# Patient Record
Sex: Female | Born: 1935 | Race: White | Hispanic: No | Smoking: Former smoker
Health system: Southern US, Community
[De-identification: ages and names within clinical notes are randomized; demographics above are authoritative.]

## PROBLEM LIST (undated history)

## (undated) DIAGNOSIS — Z8489 Family history of other specified conditions: Secondary | ICD-10-CM

## (undated) DIAGNOSIS — M5430 Sciatica, unspecified side: Secondary | ICD-10-CM

## (undated) DIAGNOSIS — I10 Essential (primary) hypertension: Secondary | ICD-10-CM

## (undated) DIAGNOSIS — C801 Malignant (primary) neoplasm, unspecified: Secondary | ICD-10-CM

## (undated) HISTORY — PX: ABDOMINAL HYSTERECTOMY: SHX81

## (undated) HISTORY — PX: TOTAL HIP ARTHROPLASTY: SHX124

## (undated) HISTORY — PX: CHOLECYSTECTOMY: SHX55

## (undated) HISTORY — PX: CATARACT EXTRACTION, BILATERAL: SHX1313

## (undated) HISTORY — PX: HIP SURGERY: SHX245

## (undated) HISTORY — PX: APPENDECTOMY: SHX54

## (undated) HISTORY — PX: SHOULDER SURGERY: SHX246

---

## 2004-12-08 ENCOUNTER — Ambulatory Visit: Payer: Self-pay | Admitting: Family Medicine

## 2006-04-19 ENCOUNTER — Ambulatory Visit: Payer: Self-pay | Admitting: Family Medicine

## 2006-07-27 ENCOUNTER — Ambulatory Visit: Payer: Self-pay | Admitting: Family Medicine

## 2007-03-05 ENCOUNTER — Encounter: Payer: Self-pay | Admitting: Otolaryngology

## 2007-03-28 ENCOUNTER — Encounter: Payer: Self-pay | Admitting: Otolaryngology

## 2007-05-22 ENCOUNTER — Ambulatory Visit: Payer: Self-pay | Admitting: Family Medicine

## 2007-10-16 ENCOUNTER — Ambulatory Visit: Payer: Self-pay | Admitting: Unknown Physician Specialty

## 2008-04-27 ENCOUNTER — Ambulatory Visit: Payer: Self-pay | Admitting: Unknown Physician Specialty

## 2008-06-04 ENCOUNTER — Ambulatory Visit: Payer: Self-pay | Admitting: Family Medicine

## 2009-10-18 ENCOUNTER — Ambulatory Visit: Payer: Self-pay | Admitting: Family Medicine

## 2010-09-29 ENCOUNTER — Ambulatory Visit: Payer: Self-pay | Admitting: Orthopedic Surgery

## 2010-10-24 ENCOUNTER — Ambulatory Visit: Payer: Self-pay | Admitting: Family Medicine

## 2010-11-18 ENCOUNTER — Ambulatory Visit: Payer: Self-pay | Admitting: Orthopedic Surgery

## 2011-02-10 ENCOUNTER — Encounter: Payer: Self-pay | Admitting: Internal Medicine

## 2011-02-26 ENCOUNTER — Encounter: Payer: Self-pay | Admitting: Internal Medicine

## 2011-03-28 ENCOUNTER — Encounter: Payer: Self-pay | Admitting: Internal Medicine

## 2011-10-26 ENCOUNTER — Ambulatory Visit: Payer: Self-pay | Admitting: Internal Medicine

## 2012-03-13 ENCOUNTER — Ambulatory Visit: Payer: Self-pay | Admitting: Internal Medicine

## 2012-03-13 LAB — CREATININE, SERUM
Creatinine: 0.92 mg/dL (ref 0.60–1.30)
EGFR (African American): >60
EGFR (Non-African Amer.): 58 — ABNORMAL LOW

## 2012-10-03 DIAGNOSIS — Z96649 Presence of unspecified artificial hip joint: Secondary | ICD-10-CM | POA: Insufficient documentation

## 2012-11-06 ENCOUNTER — Ambulatory Visit: Payer: Self-pay | Admitting: Internal Medicine

## 2013-05-14 ENCOUNTER — Ambulatory Visit: Payer: Self-pay | Admitting: Internal Medicine

## 2013-11-12 ENCOUNTER — Ambulatory Visit: Payer: Self-pay | Admitting: Internal Medicine

## 2013-11-25 ENCOUNTER — Ambulatory Visit: Payer: Self-pay | Admitting: Specialist

## 2014-03-16 DIAGNOSIS — J449 Chronic obstructive pulmonary disease, unspecified: Secondary | ICD-10-CM | POA: Insufficient documentation

## 2014-05-21 DIAGNOSIS — C4492 Squamous cell carcinoma of skin, unspecified: Secondary | ICD-10-CM | POA: Insufficient documentation

## 2014-08-25 ENCOUNTER — Emergency Department: Payer: Self-pay | Admitting: Emergency Medicine

## 2014-08-27 ENCOUNTER — Ambulatory Visit: Payer: Self-pay | Admitting: Internal Medicine

## 2014-11-05 DIAGNOSIS — Z471 Aftercare following joint replacement surgery: Secondary | ICD-10-CM | POA: Insufficient documentation

## 2014-11-05 DIAGNOSIS — Z96643 Presence of artificial hip joint, bilateral: Secondary | ICD-10-CM

## 2014-11-17 ENCOUNTER — Ambulatory Visit: Payer: Self-pay | Admitting: Internal Medicine

## 2014-11-25 ENCOUNTER — Ambulatory Visit: Payer: Self-pay | Admitting: Specialist

## 2015-05-26 ENCOUNTER — Other Ambulatory Visit: Payer: Self-pay | Admitting: Specialist

## 2015-05-26 DIAGNOSIS — R911 Solitary pulmonary nodule: Secondary | ICD-10-CM

## 2015-11-04 DIAGNOSIS — Z96641 Presence of right artificial hip joint: Secondary | ICD-10-CM | POA: Insufficient documentation

## 2015-11-18 ENCOUNTER — Ambulatory Visit
Admission: RE | Admit: 2015-11-18 | Discharge: 2015-11-18 | Disposition: A | Discharge status: Home / Self Care | Payer: Medicare Other | Source: Ambulatory Visit | Attending: Specialist | Admitting: Specialist

## 2015-11-18 DIAGNOSIS — R911 Solitary pulmonary nodule: Secondary | ICD-10-CM | POA: Diagnosis present

## 2016-03-16 ENCOUNTER — Other Ambulatory Visit: Payer: Self-pay | Admitting: Physical Medicine and Rehabilitation

## 2016-03-16 DIAGNOSIS — M5417 Radiculopathy, lumbosacral region: Secondary | ICD-10-CM

## 2016-04-04 ENCOUNTER — Ambulatory Visit: Payer: Medicare Other

## 2016-04-05 ENCOUNTER — Ambulatory Visit
Admission: RE | Admit: 2016-04-05 | Discharge: 2016-04-05 | Disposition: A | Discharge status: Home / Self Care | Payer: Medicare Other | Source: Ambulatory Visit | Attending: Physical Medicine and Rehabilitation | Admitting: Physical Medicine and Rehabilitation

## 2016-04-05 ENCOUNTER — Ambulatory Visit: Payer: Medicare Other

## 2016-04-05 DIAGNOSIS — M4806 Spinal stenosis, lumbar region: Secondary | ICD-10-CM | POA: Insufficient documentation

## 2016-04-05 DIAGNOSIS — M5417 Radiculopathy, lumbosacral region: Secondary | ICD-10-CM

## 2016-04-05 DIAGNOSIS — M5416 Radiculopathy, lumbar region: Secondary | ICD-10-CM | POA: Diagnosis present

## 2016-04-05 DIAGNOSIS — M5136 Other intervertebral disc degeneration, lumbar region: Secondary | ICD-10-CM | POA: Insufficient documentation

## 2016-06-08 ENCOUNTER — Encounter: Payer: Self-pay | Admitting: Emergency Medicine

## 2016-06-08 ENCOUNTER — Emergency Department
Admission: EM | Admit: 2016-06-08 | Discharge: 2016-06-08 | Disposition: A | Discharge status: Home / Self Care | Payer: Medicare Other | Attending: Emergency Medicine | Admitting: Emergency Medicine

## 2016-06-08 DIAGNOSIS — I159 Secondary hypertension, unspecified: Secondary | ICD-10-CM

## 2016-06-08 DIAGNOSIS — I10 Essential (primary) hypertension: Secondary | ICD-10-CM | POA: Diagnosis present

## 2016-06-08 HISTORY — DX: Sciatica, unspecified side: M54.30

## 2016-06-08 LAB — CBC WITH DIFFERENTIAL/PLATELET
Basophils Absolute: 0.1 10*3/uL (ref 0–0.1)
Basophils Relative: 1 %
Eosinophils Absolute: 0.4 10*3/uL (ref 0–0.7)
Eosinophils Relative: 5 %
HEMATOCRIT: 44.6 % (ref 35.0–47.0)
HEMOGLOBIN: 14.7 g/dL (ref 12.0–16.0)
LYMPHS ABS: 2.4 10*3/uL (ref 1.0–3.6)
Lymphocytes Relative: 30 %
MCH: 30.4 pg (ref 26.0–34.0)
MCHC: 33 g/dL (ref 32.0–36.0)
MCV: 92 fL (ref 80.0–100.0)
MONOS PCT: 8 %
Monocytes Absolute: 0.6 10*3/uL (ref 0.2–0.9)
NEUTROS ABS: 4.6 10*3/uL (ref 1.4–6.5)
NEUTROS PCT: 56 %
Platelets: 202 10*3/uL (ref 150–440)
RBC: 4.85 MIL/uL (ref 3.80–5.20)
RDW: 14 % (ref 11.5–14.5)
WBC: 8.1 10*3/uL (ref 3.6–11.0)

## 2016-06-08 LAB — BASIC METABOLIC PANEL
Anion gap: 10 (ref 5–15)
BUN: 22 mg/dL — AB (ref 6–20)
CALCIUM: 9.6 mg/dL (ref 8.9–10.3)
CO2: 30 mmol/L (ref 22–32)
CREATININE: 0.87 mg/dL (ref 0.44–1.00)
Chloride: 98 mmol/L — ABNORMAL LOW (ref 101–111)
GFR calc non Af Amer: 59 mL/min — ABNORMAL LOW (ref >60)
Glucose, Bld: 91 mg/dL (ref 65–99)
Potassium: 3.5 mmol/L (ref 3.5–5.1)
SODIUM: 138 mmol/L (ref 135–145)

## 2016-06-08 MED ORDER — CLONIDINE HCL 0.2 MG PO TABS
0.2000 mg | ORAL_TABLET | Freq: Once | ORAL | Status: AC
  Administered 2016-06-08: 0.2 mg via ORAL
  Filled 2016-06-08: qty 1

## 2016-06-08 MED ORDER — CLONIDINE HCL 0.1 MG PO TABS: 0.1000 mg | ORAL_TABLET | Freq: Two times a day (BID) | ORAL | Status: DC

## 2016-06-08 MED ORDER — CLONIDINE HCL 0.1 MG PO TABS
ORAL_TABLET | ORAL | Status: AC
  Administered 2016-06-08: 0.2 mg via ORAL
  Filled 2016-06-08: qty 2

## 2016-06-08 NOTE — ED Notes (Signed)
Pt was seen at [facility] clinic this morning for back pain and BP was noted to be elevated at 220/90. Pt was brought here for further evaluation. Pt denies SOB or chest pain.

## 2016-06-08 NOTE — ED Notes (Signed)
NAD noted at this time. Pt taken to the lobby via wheelchair with her son. Pt denies comments/concerns at this time.

## 2016-06-08 NOTE — ED Provider Notes (Signed)
Time Seen: Approximately 1440  I have reviewed the triage notes  Chief Complaint: Hypertension   History of Present Illness: Dawn [name] is a 82 y.o. female *who presents with referral by her primary physician for evaluation of hypertension. Patient states in general when she is around medical personnel that her blood pressure will go up. She was recently seen for a "" pinched nerve"" in her neck. She states that they did prescribe her something for pain and to contact her primary physician who referred here for further evaluation. Patient denies any headaches, chest pain, shortness of breath, abdominal pain. She denies any blurred vision or focal weakness in either upper or lower extremities.   Past Medical History  Diagnosis Date  . Sciatica     There are no active problems to display for this patient.   Past Surgical History  Procedure Laterality Date  . Hip surgery    . Abdominal hysterectomy      Past Surgical History  Procedure Laterality Date  . Hip surgery    . Abdominal hysterectomy      No current outpatient prescriptions on file.  Allergies:  Percocet and Percodan  Family History: History reviewed. No pertinent family history.  Social History: Social History  Substance Use Topics  . Smoking status: Never Smoker   . Smokeless tobacco: None  . Alcohol Use: No     Review of Systems:   10 point review of systems was performed and was otherwise negative:  Constitutional: No fever Eyes: No visual disturbances ENT: No sore throat, ear pain Cardiac: No chest pain Respiratory: No shortness of breath, wheezing, or stridor Abdomen: No abdominal pain, no vomiting, No diarrhea Endocrine: No weight loss, No night sweats Extremities: No peripheral edema, cyanosis Skin: No rashes, easy bruising Neurologic: No focal weakness, trouble with speech or swollowing Urologic: No dysuria, Hematuria, or urinary frequency   Physical Exam:  ED Triage  Vitals  Enc Vitals Group     BP 06/08/16 1220 238/75 mmHg     Pulse Rate 06/08/16 1220 81     Resp 06/08/16 1220 18     Temp 06/08/16 1220 97.5 F (36.4 C)     Temp Source 06/08/16 1220 Oral     SpO2 06/08/16 1220 97 %     Weight 06/08/16 1220 105 lb (47.628 kg)     Height 06/08/16 1220 5\' 2"  (1.575 m)     Head Cir --      Peak Flow --      Pain Score 06/08/16 1412 6     Pain Loc --      Pain Edu? --      Excl. in [place]? --     General: Awake , Alert , and Oriented times 3; GCS 15 Head: Normal cephalic , atraumatic Eyes: Pupils equal , round, reactive to light Nose/Throat: No nasal drainage, patent upper airway without erythema or exudate.  Neck: Supple, Full range of motion, No anterior adenopathy or palpable thyroid masses Lungs: Clear to ascultation without wheezes , rhonchi, or rales Heart: Regular rate, regular rhythm without murmurs , gallops , or rubs Abdomen: Soft, non tender without rebound, guarding , or rigidity; bowel sounds positive and symmetric in all 4 quadrants. No organomegaly .        Extremities: 2 plus symmetric pulses. No edema, clubbing or cyanosis Neurologic: normal ambulation, Motor symmetric without deficits, sensory intact Skin: warm, dry, no rashes   Labs:   All laboratory work was reviewed  including any pertinent negatives or positives listed below:  Labs Reviewed  [place] DIFFERENTIAL/PLATELET  Laboratory work was reviewed and showed no clinically significant abnormalities.   E  ED Course: * Patient appears to have no and organ effects or symptoms from her hypertension. She does have a history of a coat hypertension though her numbers are significantly elevated. The patient was started on 0.2 mg of clonidine here and had an adequate response to her blood pressure and she's been advised to continue to track her blood pressure at home. She was prescribed clonidine 0.1 mg twice a day and to check her blood pressure prior to  taking the medication to see her levels. She was also advised to return here if she develops any headache, chest pain, shortness of breath abdominal pain focal weakness or any other new concerns.  Assessment:  Hypertension      Plan:  Outpatient Clonidine 0.1 mg twice a day Patient was advised to return immediately if condition worsens. Patient was advised to follow up with their primary care physician or other specialized physicians involved in their outpatient care. The patient and/or family member/power of attorney had laboratory results reviewed at the bedside. All questions and concerns were addressed and appropriate discharge instructions were distributed by the nursing staff.             Daymon Larsen, MD 06/08/16 [phone]

## 2016-06-08 NOTE — Discharge Instructions (Signed)
Hypertension Hypertension, commonly called high blood pressure, is when the force of blood pumping through your arteries is too strong. Your arteries are the blood vessels that carry blood from your heart throughout your body. A blood pressure reading consists of a higher number over a lower number, such as 110/72. The higher number (systolic) is the pressure inside your arteries when your heart pumps. The lower number (diastolic) is the pressure inside your arteries when your heart relaxes. Ideally you want your blood pressure below 120/80. Hypertension forces your heart to work harder to pump blood. Your arteries may become narrow or stiff. Having untreated or uncontrolled hypertension can cause heart attack, stroke, kidney disease, and other problems. RISK FACTORS Some risk factors for high blood pressure are controllable. Others are not.  Risk factors you cannot control include:   Race. You may be at higher risk if you are African American.  Age. Risk increases with age.  Gender. Men are at higher risk than women before age 45 years. After age 65, women are at higher risk than men. Risk factors you can control include:  Not getting enough exercise or physical activity.  Being overweight.  Getting too much fat, sugar, calories, or salt in your diet.  Drinking too much alcohol. SIGNS AND SYMPTOMS Hypertension does not usually cause signs or symptoms. Extremely high blood pressure (hypertensive crisis) may cause headache, anxiety, shortness of breath, and nosebleed. DIAGNOSIS To check if you have hypertension, your health care provider will measure your blood pressure while you are seated, with your arm held at the level of your heart. It should be measured at least twice using the same arm. Certain conditions can cause a difference in blood pressure between your right and left arms. A blood pressure reading that is higher than normal on one occasion does not mean that you need treatment. If  it is not clear whether you have high blood pressure, you may be asked to return on a different day to have your blood pressure checked again. Or, you may be asked to monitor your blood pressure at home for 1 or more weeks. TREATMENT Treating high blood pressure includes making lifestyle changes and possibly taking medicine. Living a healthy lifestyle can help lower high blood pressure. You may need to change some of your habits. Lifestyle changes may include:  Following the DASH diet. This diet is high in fruits, vegetables, and whole grains. It is low in salt, red meat, and added sugars.  Keep your sodium intake below 2,300 mg per day.  Getting at least 30-45 minutes of aerobic exercise at least 4 times per week.  Losing weight if necessary.  Not smoking.  Limiting alcoholic beverages.  Learning ways to reduce stress. Your health care provider may prescribe medicine if lifestyle changes are not enough to get your blood pressure under control, and if one of the following is true:  You are 18-59 years of age and your systolic blood pressure is above 140.  You are 60 years of age or older, and your systolic blood pressure is above 150.  Your diastolic blood pressure is above 90.  You have diabetes, and your systolic blood pressure is over 140 or your diastolic blood pressure is over 90.  You have kidney disease and your blood pressure is above 140/90.  You have heart disease and your blood pressure is above 140/90. Your personal target blood pressure may vary depending on your medical conditions, your age, and other factors. HOME CARE INSTRUCTIONS    Have your blood pressure rechecked as directed by your health care provider.   Take medicines only as directed by your health care provider. Follow the directions carefully. Blood pressure medicines must be taken as prescribed. The medicine does not work as well when you skip doses. Skipping doses also puts you at risk for  problems.  Do not smoke.   Monitor your blood pressure at home as directed by your health care provider. SEEK MEDICAL CARE IF:   You think you are having a reaction to medicines taken.  You have recurrent headaches or feel dizzy.  You have swelling in your ankles.  You have trouble with your vision. SEEK IMMEDIATE MEDICAL CARE IF:  You develop a severe headache or confusion.  You have unusual weakness, numbness, or feel faint.  You have severe chest or abdominal pain.  You vomit repeatedly.  You have trouble breathing. MAKE SURE YOU:   Understand these instructions.  Will watch your condition.  Will get help right away if you are not doing well or get worse.   This information is not intended to replace advice given to you by your health care provider. Make sure you discuss any questions you have with your health care provider.   Document Released: 11/13/2005 Document Revised: 03/30/2015 Document Reviewed: 09/05/2013 Elsevier Interactive Patient Education [employer].   Please return immediately if condition worsens. Please contact her primary physician or the physician you were given for referral. If you have any specialist physicians involved in her treatment and plan please also contact them. Thank you for using [facility].

## 2016-06-08 NOTE — ED Notes (Signed)
Went to doctor for pinched nervein back and her bp was up and told to comehere.  No sx.  nad.

## 2016-07-19 DIAGNOSIS — E559 Vitamin D deficiency, unspecified: Secondary | ICD-10-CM | POA: Insufficient documentation

## 2016-07-19 DIAGNOSIS — E538 Deficiency of other specified B group vitamins: Secondary | ICD-10-CM | POA: Insufficient documentation

## 2016-07-20 ENCOUNTER — Other Ambulatory Visit: Payer: Self-pay | Admitting: Internal Medicine

## 2016-07-20 DIAGNOSIS — Z1231 Encounter for screening mammogram for malignant neoplasm of breast: Secondary | ICD-10-CM

## 2016-10-10 ENCOUNTER — Ambulatory Visit: Admission: RE | Admit: 2016-10-10 | Payer: Medicare Other | Source: Ambulatory Visit

## 2016-10-11 ENCOUNTER — Other Ambulatory Visit (INDEPENDENT_AMBULATORY_CARE_PROVIDER_SITE_OTHER): Payer: Self-pay | Admitting: Vascular Surgery

## 2016-10-11 DIAGNOSIS — I6523 Occlusion and stenosis of bilateral carotid arteries: Secondary | ICD-10-CM

## 2016-10-11 DIAGNOSIS — I739 Peripheral vascular disease, unspecified: Secondary | ICD-10-CM

## 2016-10-17 ENCOUNTER — Ambulatory Visit (INDEPENDENT_AMBULATORY_CARE_PROVIDER_SITE_OTHER): Payer: Medicare Other

## 2016-10-17 ENCOUNTER — Encounter (INDEPENDENT_AMBULATORY_CARE_PROVIDER_SITE_OTHER): Payer: Self-pay | Admitting: Vascular Surgery

## 2016-10-17 ENCOUNTER — Ambulatory Visit (INDEPENDENT_AMBULATORY_CARE_PROVIDER_SITE_OTHER): Payer: Medicare Other | Admitting: Vascular Surgery

## 2016-10-17 VITALS — BP 198/70 | HR 60 | Resp 16 | Ht 62.0 in | Wt 103.0 lb

## 2016-10-17 DIAGNOSIS — I10 Essential (primary) hypertension: Secondary | ICD-10-CM | POA: Diagnosis not present

## 2016-10-17 DIAGNOSIS — I6529 Occlusion and stenosis of unspecified carotid artery: Secondary | ICD-10-CM | POA: Insufficient documentation

## 2016-10-17 DIAGNOSIS — I6523 Occlusion and stenosis of bilateral carotid arteries: Secondary | ICD-10-CM

## 2016-10-17 DIAGNOSIS — I739 Peripheral vascular disease, unspecified: Secondary | ICD-10-CM

## 2016-10-17 MED ORDER — ASPIRIN EC 81 MG PO TBEC: 81.0000 mg | DELAYED_RELEASE_TABLET | Freq: Every day | ORAL | Status: DC

## 2016-10-17 NOTE — Progress Notes (Signed)
MRN : [id number]  Dawn [name] is a 82 y.o. ([birth year]) female who presents with chief complaint of  Chief Complaint  Patient presents with  . Follow-up  .  History of Present Illness: Patient returns in follow-up of multiple vascular issues. She is doing well. Other than balance issues she really has no complaints today. She denies focal neurologic symptoms. Specifically, the patient denies amaurosis fugax, speech or swallowing difficulties, or arm or leg weakness or numbness. The patient's carotid duplex demonstrates stable 1-39% right ICA stenosis and stable 40-59% left ICA stenosis. As for her peripheral vascular disease, she denies any ischemic rest pain, ulceration, or infection of the lower extremities. She does not really even have claudication symptoms as best I can tell. Her ABIs are still significantly reduced at 0.40 on the right and 0.47 on the left but these were reduced last year and her symptoms remain mild.   Current Outpatient Prescriptions  Medication Sig Dispense Refill  . cloNIDine (CATAPRES) 0.1 MG tablet Take 1 tablet (0.1 mg total) by mouth 2 (two) times daily. 60 tablet 0   Current Facility-Administered Medications  Medication Dose Route Frequency Provider Last Rate Last Dose  . aspirin EC tablet 81 mg  81 mg Oral Daily Algernon Huxley, MD        Past Medical History:  Diagnosis Date  . Sciatica     Past Surgical History:  Procedure Laterality Date  . ABDOMINAL HYSTERECTOMY    . HIP SURGERY      Social History Social History  Substance Use Topics  . Smoking status: Never Smoker  . Smokeless tobacco: Not on file  . Alcohol use No     Family History No bleeding or clotting disorders  Allergies  Allergen Reactions  . Percocet [Oxycodone-Acetaminophen]   . Percodan [Oxycodone-Aspirin]      REVIEW OF SYSTEMS (Negative unless checked)  Constitutional: [] Weight loss  [] Fever  [] Chills Cardiac: [] Chest pain   [] Chest pressure    [] Palpitations   [] Shortness of breath when laying flat   [] Shortness of breath at rest   [] Shortness of breath with exertion. Vascular:  [] Pain in legs with walking   [] Pain in legs at rest   [] Pain in legs when laying flat   [] Claudication   [] Pain in feet when walking  [] Pain in feet at rest  [] Pain in feet when laying flat   [] History of DVT   [] Phlebitis   [] Swelling in legs   [] Varicose veins   [] Non-healing ulcers Pulmonary:   [] Uses home oxygen   [] Productive cough   [] Hemoptysis   [] Wheeze  [] COPD   [] Asthma Neurologic:  [x] Dizziness  [] Blackouts   [] Seizures   [] History of stroke   [] History of TIA  [] Aphasia   [] Temporary blindness   [] Dysphagia   [] Weakness or numbness in arms   [] Weakness or numbness in legs Musculoskeletal:  [x] Arthritis   [] Joint swelling   [] Joint pain   [] Low back pain Hematologic:  [] Easy bruising  [] Easy bleeding   [] Hypercoagulable state   [] Anemic  [] Hepatitis Gastrointestinal:  [] Blood in stool   [] Vomiting blood  [] Gastroesophageal reflux/heartburn   [] Difficulty swallowing. Genitourinary:  [] Chronic kidney disease   [] Difficult urination  [] Frequent urination  [] Burning with urination   [] Blood in urine Skin:  [] Rashes   [] Ulcers   [] Wounds Psychological:  [] History of anxiety   []  History of major depression.  Physical Examination  Vitals:   10/17/16 1417 10/17/16 1418  BP: (!) 190/76 (!) 198/70  Pulse: 60   Resp: 16   Weight: 103 lb (46.7 kg)   Height: 5\' 2"  (1.575 m)    Body mass index is 18.84 kg/m. Gen:  WD/WN, NAD Head: [place]/AT, No temporalis wasting. Ear/Nose/Throat: Hearing grossly intact, nares w/o erythema or drainage, trachea midline Eyes: Conjunctiva clear. Sclera non-icteric Neck: Supple.  No bruit or JVD.  Pulmonary:  Good air movement, equal and clear to auscultation bilaterally.  Cardiac: RRR, normal S1, S2, no Murmurs, rubs or gallops. Vascular:  Vessel Right Left  Radial Palpable Palpable  Ulnar Palpable Palpable  Brachial  Palpable Palpable  Carotid Palpable, without bruit Palpable, without bruit  Aorta Not palpable N/A  Femoral Palpable Palpable  Popliteal Palpable Palpable  PT 1+ Palpable Trace Palpable  DP Not Palpable Not Palpable   Gastrointestinal: soft, non-tender/non-distended. No guarding/reflex.  Musculoskeletal: M/S 5/5 throughout.  No deformity or atrophy.  Neurologic: CN 2-12 intact. Sensation grossly intact in extremities.  Symmetrical.  Speech is fluent. Motor exam as listed above. Psychiatric: Judgment intact, Mood & affect appropriate for pt's clinical situation. Dermatologic: No rashes or ulcers noted.  No cellulitis or open wounds. Lymph : No Cervical, Axillary, or Inguinal lymphadenopathy.    CBC Lab Results  Component Value Date   WBC 8.1 06/08/2016   HGB 14.7 06/08/2016   HCT 44.6 06/08/2016   MCV 92.0 06/08/2016   PLT 202 06/08/2016    BMET    Component Value Date/Time   NA 138 06/08/2016 1458   K 3.5 06/08/2016 1458   CL 98 (L) 06/08/2016 1458   CO2 30 06/08/2016 1458   GLUCOSE 91 06/08/2016 1458   BUN 22 (H) 06/08/2016 1458   CREATININE 0.87 06/08/2016 1458   CREATININE 0.92 03/13/2012 0805   CALCIUM 9.6 06/08/2016 1458   GFRNONAA 59 (L) 06/08/2016 1458   GFRNONAA 58 (L) 03/13/2012 0805   GFRAA >60 06/08/2016 1458   GFRAA >60 03/13/2012 0805   CrCl cannot be calculated (Patient's most recent lab result is older than the maximum 21 days allowed.).  COAG No results found for: INR, PROTIME  Radiology No results found.    Assessment/Plan Essential hypertension blood pressure control important in reducing the progression of atherosclerotic disease. On appropriate oral medications.   Carotid stenosis The patient's carotid duplex demonstrates stable 1-39% right ICA stenosis and stable 40-59% left ICA stenosis. They're doing well without any focal neurologic symptoms. They will continue appropriate medical management with aspirin. We will plan to see them  back in 12 months with follow-up duplex. They will contact our office with any problems or focal neurologic deficits in the interim.    PVD (peripheral vascular disease) (HCC) Her ABIs are still significantly reduced at 0.40 on the right and 0.47 on the left but these were reduced last year and her symptoms remain mild. She clearly does not have any limb threatening symptoms at this point. She is not interested in any intervention I do not think she needs it at this point. I will plan to follow this on an annual basis unless worsening symptoms develop in the interim.    Leotis Pain, MD  10/17/2016 3:16 PM    This note was created with Dragon medical transcription system.  Any errors from dictation are purely unintentional

## 2016-10-17 NOTE — Assessment & Plan Note (Signed)
Her ABIs are still significantly reduced at 0.40 on the right and 0.47 on the left but these were reduced last year and her symptoms remain mild. She clearly does not have any limb threatening symptoms at this point. She is not interested in any intervention I do not think she needs it at this point. I will plan to follow this on an annual basis unless worsening symptoms develop in the interim.

## 2016-10-17 NOTE — Assessment & Plan Note (Signed)
The patient's carotid duplex demonstrates stable 1-39% right ICA stenosis and stable 40-59% left ICA stenosis. They're doing well without any focal neurologic symptoms. They will continue appropriate medical management with aspirin. We will plan to see them back in 12 months with follow-up duplex. They will contact our office with any problems or focal neurologic deficits in the interim.

## 2016-10-17 NOTE — Assessment & Plan Note (Signed)
blood pressure control important in reducing the progression of atherosclerotic disease. On appropriate oral medications.  

## 2017-04-11 ENCOUNTER — Ambulatory Visit (INDEPENDENT_AMBULATORY_CARE_PROVIDER_SITE_OTHER): Payer: Medicare Other | Admitting: Vascular Surgery

## 2017-04-11 ENCOUNTER — Encounter (INDEPENDENT_AMBULATORY_CARE_PROVIDER_SITE_OTHER): Payer: Self-pay

## 2017-04-11 ENCOUNTER — Encounter (INDEPENDENT_AMBULATORY_CARE_PROVIDER_SITE_OTHER): Payer: Self-pay | Admitting: Vascular Surgery

## 2017-04-11 VITALS — BP 216/78 | HR 80 | Resp 15 | Ht 60.0 in | Wt 100.0 lb

## 2017-04-11 DIAGNOSIS — I6523 Occlusion and stenosis of bilateral carotid arteries: Secondary | ICD-10-CM

## 2017-04-11 DIAGNOSIS — I10 Essential (primary) hypertension: Secondary | ICD-10-CM

## 2017-04-11 DIAGNOSIS — I739 Peripheral vascular disease, unspecified: Secondary | ICD-10-CM | POA: Diagnosis not present

## 2017-04-11 NOTE — Progress Notes (Signed)
Subjective:    Patient ID: Dawn [name], female    DOB: 1928/01/10, 83 y.o.   MRN: [id number] Chief Complaint  Patient presents with  . Re-evaluation    Leg pain   Patient last seen in 09/2016. At the time, patient underwent a carotid duplex and ABI. ABI with severe PAD bilaterally - however patients symptoms were mild. She presents today complaining of worsening bilateral claudication. There has been a noticeable shortening in her claudication distance. For example, the patient is unable to clean her kitchen without stopping multiple times due to pain in her lower extremity. Pain radiates from knee distally. RLE> LLE. No rest pain or ulceration. Denies fever, nausea or vomiting.     Review of Systems  Constitutional: Negative.   HENT: Negative.   Eyes: Negative.   Respiratory: Negative.   Cardiovascular:       Bilateral Lower Extremity Claudication  Gastrointestinal: Negative.   Endocrine: Negative.   Genitourinary: Negative.   Musculoskeletal: Negative.   Skin: Negative.   Allergic/Immunologic: Negative.   Neurological: Negative.   Hematological: Negative.   Psychiatric/Behavioral: Negative.       Objective:   Physical Exam  Constitutional: She is oriented to person, place, and time. She appears well-developed and well-nourished. No distress.  HENT:  Head: Normocephalic and atraumatic.  Eyes: Conjunctivae are normal. Pupils are equal, round, and reactive to light.  Neck: Normal range of motion.  Cardiovascular: Normal rate, regular rhythm and normal heart sounds.   Pulses:      Radial pulses are 2+ on the right side, and 2+ on the left side.  Unable to palpate pedal pulses  Pulmonary/Chest: Effort normal.  Musculoskeletal: Normal range of motion. She exhibits no edema.  Neurological: She is alert and oriented to person, place, and time.  Skin: Skin is warm and dry. She is not diaphoretic.  Psychiatric: She has a normal mood and affect. Her behavior is normal.  Judgment and thought content normal.  Vitals reviewed.  BP (!) 216/78 (BP Location: Left Arm)   Pulse 80   Resp 15   Ht 5' (1.524 m)   Wt 100 lb (45.4 kg)   BMI 19.53 kg/m   Past Medical History:  Diagnosis Date  . Sciatica    Social History   Social History  . Marital status: Widowed    Spouse name: N/A  . Number of children: N/A  . Years of education: N/A   Occupational History  . Not on file.   Social History Main Topics  . Smoking status: Never Smoker  . Smokeless tobacco: Never Used  . Alcohol use No  . Drug use: Unknown  . Sexual activity: Not on file   Other Topics Concern  . Not on file   Social History Narrative  . No narrative on file   Past Surgical History:  Procedure Laterality Date  . ABDOMINAL HYSTERECTOMY    . HIP SURGERY     No family history on file.  Allergies  Allergen Reactions  . Percocet [Oxycodone-Acetaminophen]   . Percodan [Oxycodone-Aspirin]       Assessment & Plan:  Patient last seen in 09/2016. At the time, patient underwent a carotid duplex and ABI. ABI with severe PAD bilaterally - however patients symptoms were mild. She presents today complaining of worsening bilateral claudication. There has been a noticeable shortening in her claudication distance. For example, the patient is unable to clean her kitchen without stopping multiple times due to pain in her lower extremity.  Pain radiates from knee distally. RLE> LLE. No rest pain or ulceration. Denies fever, nausea or vomiting.    1. PVD (peripheral vascular disease) ([place]) - Worsening ABI in 11/17 with severe disease. Symptoms worsening - unable to function on daily basis Unable to palpate pedal pulses Recommend RLE follow by LLE angiogram Procedure, risks and benefits explained to patient All questions answered. Patient is will to proceed.   2. Essential hypertension - Stable Encouraged good control as its slows the progression of atherosclerotic disease  3. Bilateral  carotid artery stenosis - Stable Followed regularly.   Current Outpatient Prescriptions on File Prior to Visit  Medication Sig Dispense Refill  . cloNIDine (CATAPRES) 0.1 MG tablet Take 1 tablet (0.1 mg total) by mouth 2 (two) times daily. 60 tablet 0   Current Facility-Administered Medications on File Prior to Visit  Medication Dose Route Frequency Provider Last Rate Last Dose  . aspirin EC tablet 81 mg  81 mg Oral Daily Dew, Erskine Squibb, MD        There are no Patient Instructions on file for this visit. No Follow-up on file.   Renan Danese A Deaysia Grigoryan, PA-C

## 2017-04-12 ENCOUNTER — Other Ambulatory Visit (INDEPENDENT_AMBULATORY_CARE_PROVIDER_SITE_OTHER): Payer: Self-pay | Admitting: Vascular Surgery

## 2017-04-17 ENCOUNTER — Encounter
Admission: RE | Admit: 2017-04-17 | Discharge: 2017-04-17 | Disposition: A | Discharge status: Home / Self Care | Payer: Medicare Other | Source: Ambulatory Visit | Attending: Vascular Surgery | Admitting: Vascular Surgery

## 2017-04-17 HISTORY — DX: Essential (primary) hypertension: I10

## 2017-04-17 HISTORY — DX: Family history of other specified conditions: Z84.89

## 2017-04-17 LAB — BUN: BUN: 41 mg/dL — ABNORMAL HIGH (ref 6–20)

## 2017-04-17 LAB — CREATININE, SERUM
Creatinine, Ser: 1.22 mg/dL — ABNORMAL HIGH (ref 0.44–1.00)
GFR calc Af Amer: 45 mL/min — ABNORMAL LOW (ref >60)
GFR calc non Af Amer: 39 mL/min — ABNORMAL LOW (ref >60)

## 2017-04-17 MED ORDER — CEFAZOLIN SODIUM-DEXTROSE 1-4 GM/50ML-% IV SOLN
1.0000 g | Freq: Once | INTRAVENOUS | Status: AC
  Administered 2017-04-18: 1 g via INTRAVENOUS

## 2017-04-17 NOTE — Patient Instructions (Signed)
Follow instructions given to you by Dr. Dew 

## 2017-04-18 ENCOUNTER — Inpatient Hospital Stay
Admission: RE | Admit: 2017-04-18 | Discharge: 2017-04-20 | DRG: 272 | Disposition: A | Discharge status: Home / Self Care | Payer: Medicare Other | Source: Ambulatory Visit | Attending: Vascular Surgery | Admitting: Vascular Surgery

## 2017-04-18 ENCOUNTER — Encounter
Admission: RE | Disposition: A | Discharge status: Home / Self Care | Payer: Self-pay | Source: Ambulatory Visit | Attending: Vascular Surgery

## 2017-04-18 DIAGNOSIS — M5430 Sciatica, unspecified side: Secondary | ICD-10-CM | POA: Diagnosis present

## 2017-04-18 DIAGNOSIS — I10 Essential (primary) hypertension: Secondary | ICD-10-CM | POA: Diagnosis present

## 2017-04-18 DIAGNOSIS — I70219 Atherosclerosis of native arteries of extremities with intermittent claudication, unspecified extremity: Secondary | ICD-10-CM | POA: Diagnosis present

## 2017-04-18 DIAGNOSIS — I70201 Unspecified atherosclerosis of native arteries of extremities, right leg: Secondary | ICD-10-CM | POA: Diagnosis present

## 2017-04-18 DIAGNOSIS — Z419 Encounter for procedure for purposes other than remedying health state, unspecified: Secondary | ICD-10-CM

## 2017-04-18 DIAGNOSIS — I70211 Atherosclerosis of native arteries of extremities with intermittent claudication, right leg: Secondary | ICD-10-CM | POA: Diagnosis not present

## 2017-04-18 HISTORY — PX: LOWER EXTREMITY ANGIOGRAPHY: CATH118251

## 2017-04-18 LAB — CBC
HEMATOCRIT: 39.8 % (ref 35.0–47.0)
HEMOGLOBIN: 13.6 g/dL (ref 12.0–16.0)
MCH: 31.9 pg (ref 26.0–34.0)
MCHC: 34.3 g/dL (ref 32.0–36.0)
MCV: 93 fL (ref 80.0–100.0)
Platelets: 170 10*3/uL (ref 150–440)
RBC: 4.28 MIL/uL (ref 3.80–5.20)
RDW: 13.1 % (ref 11.5–14.5)
WBC: 4.9 10*3/uL (ref 3.6–11.0)

## 2017-04-18 LAB — PROTIME-INR
INR: 1.02
PROTHROMBIN TIME: 13.4 s (ref 11.4–15.2)

## 2017-04-18 LAB — APTT: aPTT: 64 seconds — ABNORMAL HIGH (ref 24–36)

## 2017-04-18 SURGERY — LOWER EXTREMITY ANGIOGRAPHY
Anesthesia: Moderate Sedation | Laterality: Right

## 2017-04-18 MED ORDER — CLONIDINE HCL 0.1 MG PO TABS: 0.1000 mg | ORAL_TABLET | Freq: Every day | ORAL | Status: DC

## 2017-04-18 MED ORDER — SODIUM CHLORIDE 0.9 % IV SOLN
Freq: Once | INTRAVENOUS | Status: AC
  Administered 2017-04-18: 09:00:00 via INTRAVENOUS

## 2017-04-18 MED ORDER — SODIUM CHLORIDE 0.9 % IV SOLN: INTRAVENOUS | Status: DC

## 2017-04-18 MED ORDER — ONDANSETRON HCL 4 MG/2ML IJ SOLN: 4.0000 mg | Freq: Four times a day (QID) | INTRAMUSCULAR | Status: DC | PRN

## 2017-04-18 MED ORDER — MAGNESIUM HYDROXIDE 400 MG/5ML PO SUSP: 30.0000 mL | Freq: Every day | ORAL | Status: DC | PRN

## 2017-04-18 MED ORDER — KETOROLAC TROMETHAMINE 0.5 % OP SOLN
1.0000 [drp] | Freq: Four times a day (QID) | OPHTHALMIC | Status: DC
  Administered 2017-04-18 – 2017-04-20 (×6): 1 [drp] via OPHTHALMIC
  Filled 2017-04-18: qty 3

## 2017-04-18 MED ORDER — SORBITOL 70 % SOLN: 30.0000 mL | Freq: Every day | Status: DC | PRN

## 2017-04-18 MED ORDER — HEPARIN SODIUM (PORCINE) 1000 UNIT/ML IJ SOLN
INTRAMUSCULAR | Status: AC
  Filled 2017-04-18: qty 1

## 2017-04-18 MED ORDER — HYDROMORPHONE HCL 1 MG/ML IJ SOLN: 0.5000 mg | INTRAMUSCULAR | Status: DC | PRN

## 2017-04-18 MED ORDER — CEFAZOLIN SODIUM-DEXTROSE 1-4 GM/50ML-% IV SOLN
INTRAVENOUS | Status: AC
  Filled 2017-04-18: qty 50

## 2017-04-18 MED ORDER — DEXTROSE-NACL 5-0.9 % IV SOLN: INTRAVENOUS | Status: DC

## 2017-04-18 MED ORDER — TRETINOIN 0.05 % EX CREA: 1.0000 "application " | TOPICAL_CREAM | CUTANEOUS | Status: DC

## 2017-04-18 MED ORDER — GUAIFENESIN-DM 100-10 MG/5ML PO SYRP
15.0000 mL | ORAL_SOLUTION | ORAL | Status: DC | PRN
  Filled 2017-04-18: qty 15

## 2017-04-18 MED ORDER — METOPROLOL TARTRATE 5 MG/5ML IV SOLN: 2.0000 mg | INTRAVENOUS | Status: DC | PRN

## 2017-04-18 MED ORDER — DOCUSATE SODIUM 100 MG PO CAPS: 100.0000 mg | ORAL_CAPSULE | Freq: Two times a day (BID) | ORAL | Status: DC

## 2017-04-18 MED ORDER — PHENOL 1.4 % MT LIQD: 1.0000 | OROMUCOSAL | Status: DC | PRN

## 2017-04-18 MED ORDER — HYDRALAZINE HCL 20 MG/ML IJ SOLN: 5.0000 mg | INTRAMUSCULAR | Status: DC | PRN

## 2017-04-18 MED ORDER — INDOMETHACIN 50 MG PO CAPS
50.0000 mg | ORAL_CAPSULE | Freq: Two times a day (BID) | ORAL | Status: DC | PRN
  Filled 2017-04-18: qty 1

## 2017-04-18 MED ORDER — FENTANYL CITRATE (PF) 100 MCG/2ML IJ SOLN
INTRAMUSCULAR | Status: DC | PRN
  Administered 2017-04-18: 12.5 ug via INTRAVENOUS
  Administered 2017-04-18: 25 ug via INTRAVENOUS

## 2017-04-18 MED ORDER — ACETAMINOPHEN 650 MG RE SUPP
650.0000 mg | Freq: Four times a day (QID) | RECTAL | Status: DC | PRN
  Filled 2017-04-18: qty 1

## 2017-04-18 MED ORDER — HYDROMORPHONE HCL 1 MG/ML IJ SOLN: 1.0000 mg | Freq: Once | INTRAMUSCULAR | Status: DC | PRN

## 2017-04-18 MED ORDER — CHLORHEXIDINE GLUCONATE 4 % EX LIQD
60.0000 mL | Freq: Once | CUTANEOUS | Status: AC
  Administered 2017-04-19: 4 via TOPICAL

## 2017-04-18 MED ORDER — CLONIDINE HCL 0.1 MG PO TABS
0.2000 mg | ORAL_TABLET | Freq: Every evening | ORAL | Status: DC
  Administered 2017-04-18 – 2017-04-19 (×2): 0.2 mg via ORAL
  Filled 2017-04-18 (×2): qty 2

## 2017-04-18 MED ORDER — ADULT MULTIVITAMIN W/MINERALS CH
1.0000 | ORAL_TABLET | Freq: Every day | ORAL | Status: DC
  Administered 2017-04-18 – 2017-04-19 (×2): 1 via ORAL
  Filled 2017-04-18 (×2): qty 1

## 2017-04-18 MED ORDER — FAMOTIDINE 20 MG PO TABS: 40.0000 mg | ORAL_TABLET | ORAL | Status: DC | PRN

## 2017-04-18 MED ORDER — FENTANYL CITRATE (PF) 100 MCG/2ML IJ SOLN
INTRAMUSCULAR | Status: AC
  Filled 2017-04-18: qty 2

## 2017-04-18 MED ORDER — ACETAMINOPHEN 325 MG PO TABS: 325.0000 mg | ORAL_TABLET | ORAL | Status: DC | PRN

## 2017-04-18 MED ORDER — MIDAZOLAM HCL 5 MG/5ML IJ SOLN
INTRAMUSCULAR | Status: AC
  Filled 2017-04-18: qty 5

## 2017-04-18 MED ORDER — ACETAMINOPHEN 325 MG RE SUPP
325.0000 mg | RECTAL | Status: DC | PRN
  Filled 2017-04-18: qty 2

## 2017-04-18 MED ORDER — NAPROXEN SODIUM 550 MG PO TABS
550.0000 mg | ORAL_TABLET | Freq: Every day | ORAL | Status: DC
  Filled 2017-04-18: qty 1
  Filled 2017-04-18: qty 2

## 2017-04-18 MED ORDER — ATORVASTATIN CALCIUM 20 MG PO TABS: 20.0000 mg | ORAL_TABLET | Freq: Every day | ORAL | 3 refills | Status: DC

## 2017-04-18 MED ORDER — SODIUM CHLORIDE 0.9 % IV SOLN: 500.0000 mL | Freq: Once | INTRAVENOUS | Status: DC | PRN

## 2017-04-18 MED ORDER — HEPARIN (PORCINE) IN NACL 100-0.45 UNIT/ML-% IJ SOLN
600.0000 [IU]/h | INTRAMUSCULAR | Status: DC
  Administered 2017-04-18: 600 [IU]/h via INTRAVENOUS
  Filled 2017-04-18: qty 250

## 2017-04-18 MED ORDER — VITAMIN B-12 100 MCG PO TABS
50.0000 ug | ORAL_TABLET | Freq: Every day | ORAL | Status: DC
  Administered 2017-04-18 – 2017-04-20 (×2): 50 ug via ORAL
  Filled 2017-04-18 (×3): qty 1

## 2017-04-18 MED ORDER — ONDANSETRON HCL 4 MG PO TABS: 4.0000 mg | ORAL_TABLET | Freq: Four times a day (QID) | ORAL | Status: DC | PRN

## 2017-04-18 MED ORDER — LIDOCAINE-EPINEPHRINE (PF) 2 %-1:200000 IJ SOLN
INTRAMUSCULAR | Status: AC
  Filled 2017-04-18: qty 20

## 2017-04-18 MED ORDER — LABETALOL HCL 5 MG/ML IV SOLN
10.0000 mg | INTRAVENOUS | Status: DC | PRN
  Administered 2017-04-19: 10 mg via INTRAVENOUS
  Filled 2017-04-18: qty 4

## 2017-04-18 MED ORDER — VITAMIN D 1000 UNITS PO TABS
1000.0000 [IU] | ORAL_TABLET | Freq: Every day | ORAL | Status: DC
  Administered 2017-04-18 – 2017-04-20 (×2): 1000 [IU] via ORAL
  Filled 2017-04-18 (×3): qty 1

## 2017-04-18 MED ORDER — ASPIRIN EC 81 MG PO TBEC
81.0000 mg | DELAYED_RELEASE_TABLET | Freq: Every day | ORAL | Status: DC
  Administered 2017-04-18 – 2017-04-20 (×2): 81 mg via ORAL
  Filled 2017-04-18 (×3): qty 1

## 2017-04-18 MED ORDER — TRAMADOL HCL 50 MG PO TABS: 50.0000 mg | ORAL_TABLET | Freq: Four times a day (QID) | ORAL | Status: DC | PRN

## 2017-04-18 MED ORDER — METHYLPREDNISOLONE SODIUM SUCC 125 MG IJ SOLR: 125.0000 mg | INTRAMUSCULAR | Status: DC | PRN

## 2017-04-18 MED ORDER — SODIUM CHLORIDE 0.9 % IV SOLN
INTRAVENOUS | Status: DC
  Administered 2017-04-18: 09:00:00 via INTRAVENOUS

## 2017-04-18 MED ORDER — ASPIRIN EC 81 MG PO TBEC: 81.0000 mg | DELAYED_RELEASE_TABLET | Freq: Every day | ORAL | Status: DC

## 2017-04-18 MED ORDER — HYDROCHLOROTHIAZIDE 25 MG PO TABS
25.0000 mg | ORAL_TABLET | Freq: Every day | ORAL | Status: DC
  Administered 2017-04-18 – 2017-04-20 (×2): 25 mg via ORAL
  Filled 2017-04-18 (×3): qty 1

## 2017-04-18 MED ORDER — HEPARIN (PORCINE) IN NACL 2-0.9 UNIT/ML-% IJ SOLN
INTRAMUSCULAR | Status: AC
  Filled 2017-04-18: qty 1000

## 2017-04-18 MED ORDER — CHLORHEXIDINE GLUCONATE 4 % EX LIQD
60.0000 mL | Freq: Once | CUTANEOUS | Status: AC
  Administered 2017-04-18: 4 via TOPICAL

## 2017-04-18 MED ORDER — OXYCODONE-ACETAMINOPHEN 5-325 MG PO TABS
1.0000 | ORAL_TABLET | ORAL | Status: DC | PRN
  Administered 2017-04-18 – 2017-04-20 (×2): 1 via ORAL
  Filled 2017-04-18: qty 2
  Filled 2017-04-18: qty 1

## 2017-04-18 MED ORDER — MIDAZOLAM HCL 2 MG/2ML IJ SOLN
INTRAMUSCULAR | Status: DC | PRN
  Administered 2017-04-18: 0.5 mg via INTRAVENOUS
  Administered 2017-04-18: 1 mg via INTRAVENOUS

## 2017-04-18 MED ORDER — FLEET ENEMA 7-19 GM/118ML RE ENEM: 1.0000 | ENEMA | Freq: Once | RECTAL | Status: DC | PRN

## 2017-04-18 MED ORDER — ACETAMINOPHEN 325 MG PO TABS: 650.0000 mg | ORAL_TABLET | Freq: Four times a day (QID) | ORAL | Status: DC | PRN

## 2017-04-18 MED ORDER — HEPARIN SODIUM (PORCINE) 1000 UNIT/ML IJ SOLN
INTRAMUSCULAR | Status: DC | PRN
  Administered 2017-04-18: 3000 [IU] via INTRAVENOUS
  Administered 2017-04-18: 1000 [IU] via INTRAVENOUS

## 2017-04-18 SURGICAL SUPPLY — 19 items
BALLN LUTONIX 4X150X130 (BALLOONS) ×6
BALLN LUTONIX 5X150X130 (BALLOONS) ×3
BALLN LUTONIX DCB 7X40X130 (BALLOONS) ×3
BALLN ULTRVRSE 018 2.5X150X150 (BALLOONS) ×3
BALLOON LUTONIX 4X150X130 (BALLOONS) ×2 IMPLANT
BALLOON LUTONIX 5X150X130 (BALLOONS) ×1 IMPLANT
BALLOON LUTONIX DCB 7X40X130 (BALLOONS) ×1 IMPLANT
BALLOON ULTRVS 018 2.5X150X150 (BALLOONS) ×1 IMPLANT
CATH CXI 4F 90 DAV (CATHETERS) ×3 IMPLANT
CATH PIG 70CM (CATHETERS) ×3 IMPLANT
DEVICE PRESTO INFLATION (MISCELLANEOUS) ×3 IMPLANT
DEVICE STARCLOSE SE CLOSURE (Vascular Products) ×3 IMPLANT
GLIDEWIRE ADV .035X260CM (WIRE) ×3 IMPLANT
PACK ANGIOGRAPHY (CUSTOM PROCEDURE TRAY) ×3 IMPLANT
SHEATH ANL2 6FRX45 HC (SHEATH) ×3 IMPLANT
SHEATH BRITE TIP 5FRX11 (SHEATH) ×3 IMPLANT
TUBING CONTRAST HIGH PRESS 72 (TUBING) ×3 IMPLANT
WIRE G V18X300CM (WIRE) ×3 IMPLANT
WIRE J 3MM .035X145CM (WIRE) ×3 IMPLANT

## 2017-04-18 NOTE — Progress Notes (Signed)
[facility]  Daily Progress Note   Subjective  - Day of Surgery  Resting quietly.  No events after procedure  Objective Vitals:   04/18/17 1200 04/18/17 1215 04/18/17 1245 04/18/17 1311  BP: (!) 169/54  (!) 165/96 (!) 156/65  Pulse: (!) 57 61  81  Resp: 12 12 14    Temp:    97.7 F (36.5 C)  TempSrc:      SpO2: 94% 91%  98%  Weight:      Height:       No intake or output data in the 24 hours ending 04/18/17 1635  PULM  CTAB CV  RRR VASC  Foot warm, no palpable pulses  Laboratory CBC    Component Value Date/Time   WBC 4.9 04/18/2017 1437   HGB 13.6 04/18/2017 1437   HCT 39.8 04/18/2017 1437   PLT 170 04/18/2017 1437    BMET    Component Value Date/Time   NA 138 06/08/2016 1458   K 3.5 06/08/2016 1458   CL 98 (L) 06/08/2016 1458   CO2 30 06/08/2016 1458   GLUCOSE 91 06/08/2016 1458   BUN 41 (H) 04/17/2017 1100   CREATININE 1.22 (H) 04/17/2017 1100   CREATININE 0.92 03/13/2012 0805   CALCIUM 9.6 06/08/2016 1458   GFRNONAA 39 (L) 04/17/2017 1100   GFRNONAA 58 (L) 03/13/2012 0805   GFRAA 45 (L) 04/17/2017 1100   GFRAA >60 03/13/2012 0805    Assessment/Planning:    For right CFA, PFA, and SFA endarterectomy tomorrow and plan stent placement in the distal SFA/popliteal as well.    Risks and benefits discussed and patient agreeable to proceed.    Leotis Pain  04/18/2017, 4:35 PM

## 2017-04-18 NOTE — Progress Notes (Signed)
Patient remains clinically stable post procedure, left groin without bleeding nor hematoma,with dressing dry/intact. sr per monitor.sitting up and eating lunch. Denies complaints at this time. Report called to Marta Lamas on 2c with plan reviewed, for or tomorrow. Dr Lucky Cowboy discussed with patient and son earlier about surgery for tomorrow, and patient wanting to proceed.

## 2017-04-18 NOTE — Discharge Instructions (Signed)
Groin Insertion Instructions-If you lose feeling or develop tingling or pain in your leg or foot after the procedure, please walk around first.  If the discomfort does not improve , contact your physician and proceed to the nearest emergency room.  Loss of feeling in your leg might mean that a blockage has formed in the artery and this can be appropriately treated.  Limit your activity for the next two days after your procedure.  Avoid stooping, bending, heavy lifting or exertion as this may put pressure on the insertion site.  Resume normal activities in 48 hours.  You may shower after 24 hours but avoid excessive warm water and do not scrub the site.  Remove clear dressing in 48 hours.  If you have had a closure device inserted, do not soak in a tub bath or a hot tub for at least one week. ° °No driving for 48 hours after discharge.  After the procedure, check the insertion site occasionally.  If any oozing occurs or there is apparent swelling, firm pressure over the site will prevent a bruise from forming.  You can not hurt anything by pressing directly on the site.  The pressure stops the bleeding by allowing a small clot to form.  If the bleeding continues after the pressure has been applied for more than 15 minutes, call 911 or go to the nearest emergency room.   ° °The x-ray dye causes you to pass a considerate amount of urine.  For this reason, you will be asked to drink plenty of liquids after the procedure to prevent dehydration.  You may resume you regular diet.  Avoid caffeine products.   ° °For pain at the site of your procedure, take non-aspirin medicines such as Tylenol. ° °Medications: A. Hold Metformin for 48 hours if applicable.  B. Continue taking all your present medications at home unless your doctor prescribes any changes. ° °Moderate Conscious Sedation, Adult, Care After °These instructions provide you with information about caring for yourself after your procedure. Your health care provider  may also give you more specific instructions. Your treatment has been planned according to current medical practices, but problems sometimes occur. Call your health care provider if you have any problems or questions after your procedure. °What can I expect after the procedure? °After your procedure, it is common: °· To feel sleepy for several hours. °· To feel clumsy and have poor balance for several hours. °· To have poor judgment for several hours. °· To vomit if you eat too soon. °Follow these instructions at home: °For at least 24 hours after the procedure:  ° °· Do not: °¨ Participate in activities where you could fall or become injured. °¨ Drive. °¨ Use heavy machinery. °¨ Drink alcohol. °¨ Take sleeping pills or medicines that cause drowsiness. °¨ Make important decisions or sign legal documents. °¨ Take care of children on your own. °· Rest. °Eating and drinking  °· Follow the diet recommended by your health care provider. °· If you vomit: °¨ Drink water, juice, or soup when you can drink without vomiting. °¨ Make sure you have little or no nausea before eating solid foods. °General instructions  °· Have a responsible adult stay with you until you are awake and alert. °· Take over-the-counter and prescription medicines only as told by your health care provider. °· If you smoke, do not smoke without supervision. °· Keep all follow-up visits as told by your health care provider. This is important. °Contact a health   care provider if: °· You keep feeling nauseous or you keep vomiting. °· You feel light-headed. °· You develop a rash. °· You have a fever. °Get help right away if: °· You have trouble breathing. °This information is not intended to replace advice given to you by your health care provider. Make sure you discuss any questions you have with your health care provider. °Document Released: 09/03/2013 Document Revised: 04/17/2016 Document Reviewed: 03/04/2016 °Elsevier Interactive Patient Education © 2017  [employer] ° °

## 2017-04-18 NOTE — H&P (Signed)
[facility] History & Physical Update  The patient was interviewed and re-examined.  The patient's previous History and Physical has been reviewed and is unchanged.  There is no change in the plan of care. We plan to proceed with the scheduled procedure.  Leotis Pain, MD  04/18/2017, 8:12 AM

## 2017-04-18 NOTE — Progress Notes (Signed)
ANTICOAGULATION CONSULT NOTE - Initial Consult  Pharmacy Consult for Heparin  Indication: claudication of lower extremities  Allergies  Allergen Reactions  . Percocet [Oxycodone-Acetaminophen] Itching  . Percodan [Oxycodone-Aspirin] Itching    Patient Measurements: Height: 5\' 2"  (157.5 cm) Weight: 100 lb (45.4 kg) IBW/kg (Calculated) : 50.1 Heparin Dosing Weight:   Vital Signs: Temp: 97.7 F (36.5 C) (05/23 1311) Temp Source: Oral (05/23 0812) BP: 156/65 (05/23 1311) Pulse Rate: 81 (05/23 1311)  Labs:  Recent Labs  04/17/17 1100 04/18/17 1437  HGB  --  13.6  HCT  --  39.8  PLT  --  170  APTT  --  64*  LABPROT  --  13.4  INR  --  1.02  CREATININE 1.22*  --     Estimated Creatinine Clearance: 23.7 mL/min (A) (by C-G formula based on SCr of 1.22 mg/dL (H)).   Medical History: Past Medical History:  Diagnosis Date  . 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 Hypertension   . Sciatica   . Sciatica     Medications:  Scheduled:  . aspirin EC  81 mg Oral Daily  . chlorhexidine  60 mL Topical Once   And  . [START ON 04/19/2017] chlorhexidine  60 mL Topical Once  . cholecalciferol  1,000 Units Oral Daily  . [START ON 04/19/2017] cloNIDine  0.1-0.2 mg Oral Daily  . cloNIDine  0.2 mg Oral QPM  . hydrochlorothiazide  25 mg Oral Daily  . ketorolac  1 drop Left Eye QID  . multivitamin with minerals  1 tablet Oral QHS  . [START ON 04/19/2017] naproxen sodium  550 mg Oral Daily  . vitamin B-12  50 mcg Oral Daily    Assessment: 83 year old female on heparin gtt (600 units/hr) prior to vascular surgery on 5/24 AM.   Goal of Therapy:    Plan:  Per Dr Lucky Cowboy, will run heparin drip at steady rate of 600 units/hr until vascular procedure scheduled for 5/24 AM.   Do not adjust dose per nomogram.   0523 1800: ordered HL for the AM as patient is 45.4 kg to make sure HL is not considerably high.   Loree Fee,  PharmD 04/18/2017,5:57 PM

## 2017-04-18 NOTE — Progress Notes (Signed)
Received call from pharmancy who states they did not receive a consult order for the patient in 205 whom has a heparin drip. This RN called Md who stated pharmacy was not needed because the rate will stay at 37ml/hr and not be titrated for any reason. Baseline labs were drawn but no more are to be ordered per MD.

## 2017-04-18 NOTE — Progress Notes (Signed)
ANTICOAGULATION CONSULT NOTE - Initial Consult  Pharmacy Consult for Heparin  Indication: claudication of lower extremities  Allergies  Allergen Reactions  . Percocet [Oxycodone-Acetaminophen] Itching  . Percodan [Oxycodone-Aspirin] Itching    Patient Measurements: Height: 5\' 2"  (157.5 cm) Weight: 100 lb (45.4 kg) IBW/kg (Calculated) : 50.1 Heparin Dosing Weight:   Vital Signs: Temp: 97.7 F (36.5 C) (05/23 1311) Temp Source: Oral (05/23 0812) BP: 156/65 (05/23 1311) Pulse Rate: 81 (05/23 1311)  Labs:  Recent Labs  04/17/17 1100 04/18/17 1437  HGB  --  13.6  HCT  --  39.8  PLT  --  170  APTT  --  64*  LABPROT  --  13.4  INR  --  1.02  CREATININE 1.22*  --     Estimated Creatinine Clearance: 23.7 mL/min (A) (by C-G formula based on SCr of 1.22 mg/dL (H)).   Medical History: Past Medical History:  Diagnosis Date  . 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 Hypertension   . Sciatica   . Sciatica     Medications:  Scheduled:  . aspirin EC  81 mg Oral Daily  . chlorhexidine  60 mL Topical Once   And  . [START ON 04/19/2017] chlorhexidine  60 mL Topical Once  . cholecalciferol  1,000 Units Oral Daily  . [START ON 04/19/2017] cloNIDine  0.1-0.2 mg Oral Daily  . cloNIDine  0.2 mg Oral QPM  . hydrochlorothiazide  25 mg Oral Daily  . ketorolac  1 drop Left Eye QID  . multivitamin with minerals  1 tablet Oral QHS  . [START ON 04/19/2017] naproxen sodium  550 mg Oral Daily  . vitamin B-12  50 mcg Oral Daily    Assessment: 83 year old female on heparin gtt (600 units/hr) prior to vascular surgery on 5/24 AM.   Goal of Therapy:    Plan:  Per Dr Lucky Cowboy, will run heparin drip at steady rate of 600 units/hr until vascular procedure scheduled for 5/24 AM.   Do not adjust dose per nomogram.   Aishia Barkey D 04/18/2017,4:13 PM

## 2017-04-18 NOTE — Consult Note (Signed)
[facility] [place] NAME: Dawn [name]    MR#:  [id number]  DATE OF BIRTH:  December 08, 1927  DATE OF ADMISSION:  04/18/2017  PRIMARY CARE PHYSICIAN: Idelle Crouch, MD   REQUESTING/REFERRING PHYSICIAN: Dew  CHIEF COMPLAINT:  No chief complaint on file.  medical consult called in for medical management.  HISTORY OF PRESENT ILLNESS: Dawn [name]  is a 83 y.o. female with a known history of Htn, PVD- admitted to vascular service for right LL angioplasty and stenting- and she is here after that. No complains now.  PAST MEDICAL HISTORY:   Past Medical History:  Diagnosis Date  . 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 Hypertension   . Sciatica   . Sciatica     PAST SURGICAL HISTORY: Past Surgical History:  Procedure Laterality Date  . ABDOMINAL HYSTERECTOMY    . CHOLECYSTECTOMY    . HIP SURGERY Bilateral   . SHOULDER SURGERY Bilateral   . TOTAL HIP ARTHROPLASTY Bilateral     SOCIAL HISTORY:  Social History  Substance Use Topics  . Smoking status: Former Smoker    Types: Cigarettes  . Smokeless tobacco: Never Used  . Alcohol use No    FAMILY HISTORY:  Family History  Problem Relation Age of Onset  . Diabetes Father   . Heart disease Father     DRUG ALLERGIES:  Allergies  Allergen Reactions  . Percocet [Oxycodone-Acetaminophen] Itching  . Percodan [Oxycodone-Aspirin] Itching    REVIEW OF SYSTEMS:   CONSTITUTIONAL: No fever, fatigue or weakness.  EYES: No blurred or double vision.  EARS, NOSE, AND THROAT: No tinnitus or ear pain.  RESPIRATORY: No cough, shortness of breath, wheezing or hemoptysis.  CARDIOVASCULAR: No chest pain, orthopnea, edema.  GASTROINTESTINAL: No nausea, vomiting, diarrhea or abdominal pain.  GENITOURINARY: No dysuria, hematuria.  ENDOCRINE: No polyuria, nocturia,  HEMATOLOGY: No anemia, easy bruising or bleeding SKIN: No rash or  lesion. MUSCULOSKELETAL: No joint pain or arthritis.   NEUROLOGIC: No tingling, numbness, weakness.  PSYCHIATRY: No anxiety or depression.   MEDICATIONS AT HOME:  Prior to Admission medications   Medication Sig Start Date End Date Taking? Authorizing Provider  aspirin EC 81 MG tablet Take 81 mg by mouth daily.   Yes [provider]  Cholecalciferol (VITAMIN D3 PO) Take 1 tablet by mouth daily.   Yes [provider]  cloNIDine (CATAPRES) 0.1 MG tablet Take 1 tablet (0.1 mg total) by mouth 2 (two) times daily. Patient taking differently: Take 0.1-0.2 mg by mouth 2 (two) times daily. 0.1 mg in the morning & 0.2 mg in the evening 06/08/16  Yes Daymon Larsen, MD  Cyanocobalamin (VITAMIN B-12 PO) Take 1 tablet by mouth daily.   Yes [provider]  hydrochlorothiazide (HYDRODIURIL) 25 MG tablet Take 25 mg by mouth daily.  03/22/17  Yes [provider]  indomethacin (INDOCIN) 50 MG capsule Take 50 mg by mouth 2 (two) times daily as needed (for gout flares).   Yes [provider]  ketorolac (ACULAR) 0.4 % SOLN Place 1 drop into the left eye 4 (four) times daily.   Yes [provider]  Multiple Minerals-Vitamins (LEG-GESIC PO) Take 1 tablet by mouth at bedtime. LEGATRIN PM   Yes [provider]  naproxen sodium (ANAPROX) 220 MG tablet Take 440 mg by mouth daily.   Yes [provider]  traMADol (ULTRAM) 50 MG tablet Take  50 mg by mouth every 6 (six) hours as needed.  03/22/17  Yes [provider]  tretinoin (RETIN-A) 0.05 % cream Apply 1 application topically every other day. EVERY OTHER NIGHT   Yes [provider]  atorvastatin (LIPITOR) 20 MG tablet Take 1 tablet (20 mg total) by mouth daily. 04/18/17   Algernon Huxley, MD      PHYSICAL EXAMINATION:   VITAL SIGNS: Blood pressure (!) 156/65, pulse 81, temperature 97.7 F (36.5 C), resp. rate 14, height 5\' 2"  (1.575 m), weight 45.4 kg (100 lb), SpO2 98  %.  GENERAL:  83 y.o.-year-old patient patient lying in the bed with no acute distress.  EYES: Pupils equal, round, reactive to light and accommodation. No scleral icterus. Extraocular muscles intact.  HEENT: Head atraumatic, normocephalic. Oropharynx and nasopharynx clear.  NECK:  Supple, no jugular venous distention. No thyroid enlargement, no tenderness.  LUNGS: Normal breath sounds bilaterally, no wheezing, rales,rhonchi or crepitation. No use of accessory muscles of respiration.  CARDIOVASCULAR: S1, S2 normal. No murmurs, rubs, or gallops.  ABDOMEN: Soft, nontender, nondistended. Bowel sounds present. No organomegaly or mass.  EXTREMITIES: No pedal edema, cyanosis, or clubbing.  NEUROLOGIC: Cranial nerves II through XII are intact. Muscle strength 5/5 in all extremities. Sensation intact. Gait not checked.  PSYCHIATRIC: The patient is alert and oriented x 3.  SKIN: No obvious rash, lesion, or ulcer.   LABORATORY PANEL:   CBC No results for input(s): WBC, HGB, HCT, PLT, MCV, MCH, MCHC, RDW, LYMPHSABS, MONOABS, EOSABS, BASOSABS, BANDABS in the last 168 hours.  Invalid input(s): NEUTRABS, BANDSABD ------------------------------------------------------------------------------------------------------------------  Chemistries   Recent Labs Lab 04/17/17 1100  BUN 41*  CREATININE 1.22*   ------------------------------------------------------------------------------------------------------------------ estimated creatinine clearance is 23.7 mL/min (A) (by C-G formula based on SCr of 1.22 mg/dL (H)). ------------------------------------------------------------------------------------------------------------------ No results for input(s): TSH, T4TOTAL, T3FREE, THYROIDAB in the last 72 hours.  Invalid input(s): FREET3   Coagulation profile No results for input(s): INR, PROTIME in the last 168  hours. ------------------------------------------------------------------------------------------------------------------- No results for input(s): DDIMER in the last 72 hours. -------------------------------------------------------------------------------------------------------------------  Cardiac Enzymes No results for input(s): CKMB, TROPONINI, MYOGLOBIN in the last 168 hours.  Invalid input(s): CK ------------------------------------------------------------------------------------------------------------------ Invalid input(s): POCBNP  ---------------------------------------------------------------------------------------------------------------  Urinalysis No results found for: COLORURINE, APPEARANCEUR, LABSPEC, PHURINE, GLUCOSEU, HGBUR, BILIRUBINUR, KETONESUR, PROTEINUR, UROBILINOGEN, NITRITE, LEUKOCYTESUR   RADIOLOGY: No results found.  EKG: No orders found for this or any previous visit.  IMPRESSION AND PLAN:  * Hypertension   Clonidine and HCTz, cont for now.   Monitor.  * PVD   S/p angioplasty RLL, manage per vascular team.   On heparin drip.  All the records are reviewed and case discussed with ED provider. Management plans discussed with the patient, family and they are in agreement.  CODE STATUS: Full. Code Status History    Date Active Date Inactive Code Status Order ID Comments User Context   04/18/2017 11:50 AM 04/18/2017  1:02 PM Full Code [id number]  Algernon Huxley, MD Inpatient   04/18/2017 11:04 AM 04/18/2017 11:50 AM Full Code [id number]  Algernon Huxley, MD Inpatient       TOTAL TIME TAKING CARE OF THIS PATIENT: 40 minutes.    Vaughan Basta M.D on 04/18/2017   Between 7am to 6pm - Pager - [phone]  After 6pm go to [email or web address] - password [email or web address] [facility]  Office  [phone]  CC: Primary care physician; Idelle Crouch, MD   Note: This dictation was prepared with Dragon dictation along  with smaller phrase  technology. Any transcriptional errors that result from this process are unintentional.

## 2017-04-18 NOTE — Progress Notes (Signed)
Patient clinically stable post lower extremity angiogram, son at bedside.Dr Lucky Cowboy out to speak with patient and son with questions answered. Denies complaints, no bleeding nor hematoma at left groin site. Patient in need of enarterectomy, and after discussion, or scheduled for tomorrow,thus patient will be admitted overnight, and have surgery tomorrow.will continue to monitor and await for available bed for admission.

## 2017-04-18 NOTE — Op Note (Signed)
[facility] Percutaneous Study/Intervention Procedural Note   Date of Surgery: 04/18/2017  Surgeon(s):DEW,JASON   Assistants:none  Pre-operative Diagnosis: PAD with claudication bilateral lower extremities  Post-operative diagnosis: Same  Procedure(s) Performed: 1. Ultrasound guidance for vascular access left femoral artery 2. Catheter placement into right peroneal artery from left femoral approach 3. Aortogram and selective right lower extremity angiogram 4. Percutaneous transluminal angioplasty of the right peroneal artery and tibioperoneal trunk with 3 mm diameter by 15 cm length angioplasty balloon 5. Percutaneous transluminal angioplasty of the right common iliac artery with 7 mm diameter by 4 cm length Lutonix drug-coated angioplasty balloon  6.  Percutaneous transluminal angioplasty of the right common femoral artery with 5 mm diameter by 15 cm length Lutonix drug-coated angioplasty balloon  7.  Percutaneous transluminal angioplasty of the right SFA and above-knee popliteal artery with 4 mm diameter by 15 cm length Lutonix drug-coated angioplasty balloon distally and 5 mm diameter by 15 cm length angioplasty balloon in the proximal and mid SFA 8. StarClose closure device left femoral artery  EBL: 10 cc  Contrast: 65 cc  Fluoro Time: 8.6 minutes  Moderate Conscious Sedation Time: approximately 55 minutes using 1.5 mg of Versed and 37.5 mcg of Fentanyl  Indications: Patient is a 83 y.o.female with worsening claudication symptoms that have now become debilitating and lifestyle limiting. The patient has noninvasive study showing markedly reduced ABIs previously, but 6 months ago her symptoms were not debilitating. Now they are. The patient is brought in for angiography for further evaluation and potential treatment. Risks and benefits are discussed and informed  consent is obtained  Procedure: The patient was identified and appropriate procedural time out was performed. The patient was then placed supine on the table and prepped and draped in the usual sterile fashion.Moderate conscious sedation was administered during a face to face encounter with the patient throughout the procedure with my supervision of the RN administering medicines and monitoring the patient's vital signs, pulse oximetry, telemetry and mental status throughout from the start of the procedure until the patient was taken to the recovery room. Ultrasound was used to evaluate the left common femoral artery. It was patent . A digital ultrasound image was acquired. A Seldinger needle was used to access the left common femoral artery under direct ultrasound guidance and a permanent image was performed. A 0.035 J wire was advanced without resistance and a 5Fr sheath was placed. Pigtail catheter was placed into the aorta and an AP aortogram was performed. This demonstrated normal right renal artery, high-grade stenosis of the left renal artery, very calcific aorta and iliac arteries. Right common iliac artery has about 70-75% stenosis. The left iliac system does not have significant stenosis. I then crossed the aortic bifurcation and advanced to the right femoral head. Selective right lower extremity angiogram was then performed. This demonstrated occlusion of the mid common femoral artery. The profunda femoris artery was occluded proximally and reconstituted through collaterals. The SFA was occluded and then through collaterals reconstituted at about the level of Hunter's canal. The popliteal artery was then reasonably normal following disease in its proximal segment. There was then only 1 vessel runoff distally with what appeared to be about a 70-75% stenosis in the proximal peroneal artery or tibioperoneal trunk and another stenosis in the 70% range about 8-10 cm beyond the origin. The posterior  tibial and anterior tibial arteries appeared to be chronically occluded. The patient was systemically heparinized and I started by treating the right common  iliac artery stenosis prior to placing the Up & Over sheath. A 7 mm diameter by 4 cm length Lutonix drug-coated angioplasty balloon was placed over a Terumo advantage wire and this was inflated to 8 atm for 1 minute. There was about a 40% residual stenosis but the flow was improved and this was a poor location for stent placement. While we were performing this the advantage wire navigated through the common femoral and SFA occlusions and was in the popliteal artery with no difficulty. Given her advanced age I felt was reasonable to attempt percutaneous intervention and I would like to treat her tibial disease even if we need an open surgical therapy for her. With this in mind, a 6 [place] Ansell sheath was then placed over the [employer] wire. I then used a Kumpe catheter and the advantage wire to confirm intraluminal flow in the popliteal artery at the level of the knee. Exchanged for a 0.018 wire and crossed the peroneal artery stenoses and confirm intraluminal flow with the CXI catheter in the mid to distal peroneal artery the a 18 wire was replaced. The peroneal lesion was treated with a 3 mm diameter angioplasty balloon. This was inflated up to 8 atm for 1 minute. I then used a 4 mm diameter by 15 cm length Lutonix drug-coated angioplasty balloon in the proximal popliteal artery and distal SFA. This was inflated up to 12 atm for 1 minute. A 5 mm diameter by 15 cm length Lutonix drug-coated angioplasty balloon was used with the first inflation in the common femoral artery down into the proximal SFA. This was taken to 12 atm for 1 minute. This was then advanced and additional inflation in the proximal and mid SFA were inflated to 10 atm for 1 minute. Completion angiogram showed less than 20% residual stenosis in the peroneal artery and tibioperoneal  trunk, but about an 80% residual stenosis was present in the common femoral artery. The mid SFA had a dissection with greater than 50% residual stenosis as well. I felt that she would be best served with a femoral endarterectomy that would address the proximal SFA and profunda femoris arteries as well as the common femoral lesion as the profunda femoris artery remained occluded after these interventions. I felt that a stent placement in this residual SFA disease could be done at that time and I plan to admit the patient to the hospital and perform surgical therapy tomorrow. I elected to terminate the procedure. The sheath was removed and StarClose closure device was deployed in the left femoral artery with excellent hemostatic result. The patient was taken to the recovery room in stable condition having tolerated the procedure well.  Findings:  Aortogram: Normal right renal artery, high-grade stenosis of the left renal artery, very calcific aorta and iliac arteries. Right common iliac artery has about 70-75% stenosis. The left iliac system does not have significant stenosis.  Right Lower Extremity: This demonstrated occlusion of the mid common femoral artery. The profunda femoris artery was occluded proximally and reconstituted through collaterals. The SFA was occluded and then through collaterals reconstituted at about the level of Hunter's canal. The popliteal artery was then reasonably normal following disease in its proximal segment. There was then only 1 vessel runoff distally with what appeared to be about a 70-75% stenosis in the proximal peroneal artery or tibioperoneal trunk and another stenosis in the 70% range about 8-10 cm beyond the origin. The posterior tibial and anterior tibial arteries appeared to be chronically occluded  Disposition: Patient was taken to the recovery room in stable condition having tolerated the procedure well.  Complications: None  Leotis Pain 04/18/2017 2:10 PM   This note was created with Dragon Medical transcription system. Any errors in dictation are purely unintentional.

## 2017-04-19 ENCOUNTER — Inpatient Hospital Stay: Payer: Medicare Other | Admitting: Certified Registered Nurse Anesthetist

## 2017-04-19 ENCOUNTER — Inpatient Hospital Stay: Payer: Medicare Other

## 2017-04-19 ENCOUNTER — Encounter
Admission: RE | Disposition: A | Discharge status: Home / Self Care | Payer: Self-pay | Source: Ambulatory Visit | Attending: Vascular Surgery

## 2017-04-19 ENCOUNTER — Encounter: Payer: Self-pay | Admitting: Vascular Surgery

## 2017-04-19 DIAGNOSIS — I70211 Atherosclerosis of native arteries of extremities with intermittent claudication, right leg: Secondary | ICD-10-CM

## 2017-04-19 HISTORY — PX: LOWER EXTREMITY ANGIOGRAM: SHX5508

## 2017-04-19 HISTORY — PX: ENDARTERECTOMY FEMORAL: SHX5804

## 2017-04-19 LAB — HEPARIN LEVEL (UNFRACTIONATED): Heparin Unfractionated: 0.5 IU/mL (ref 0.30–0.70)

## 2017-04-19 SURGERY — ENDARTERECTOMY, FEMORAL
Anesthesia: General | Laterality: Right

## 2017-04-19 MED ORDER — PROPOFOL 10 MG/ML IV BOLUS
INTRAVENOUS | Status: DC | PRN
  Administered 2017-04-19: 100 mg via INTRAVENOUS

## 2017-04-19 MED ORDER — FENTANYL CITRATE (PF) 250 MCG/5ML IJ SOLN
INTRAMUSCULAR | Status: AC
  Filled 2017-04-19: qty 5

## 2017-04-19 MED ORDER — LIDOCAINE HCL (PF) 2 % IJ SOLN
INTRAMUSCULAR | Status: AC
  Filled 2017-04-19: qty 2

## 2017-04-19 MED ORDER — ROCURONIUM BROMIDE 50 MG/5ML IV SOLN
INTRAVENOUS | Status: AC
  Filled 2017-04-19: qty 1

## 2017-04-19 MED ORDER — HEPARIN SODIUM (PORCINE) 1000 UNIT/ML IJ SOLN
INTRAMUSCULAR | Status: DC | PRN
  Administered 2017-04-19: 4000 [IU] via INTRAVENOUS

## 2017-04-19 MED ORDER — ROCURONIUM BROMIDE 100 MG/10ML IV SOLN
INTRAVENOUS | Status: DC | PRN
  Administered 2017-04-19: 30 mg via INTRAVENOUS
  Administered 2017-04-19: 5 mg via INTRAVENOUS

## 2017-04-19 MED ORDER — EPHEDRINE SULFATE 50 MG/ML IJ SOLN
INTRAMUSCULAR | Status: AC
  Filled 2017-04-19: qty 1

## 2017-04-19 MED ORDER — ONDANSETRON HCL 4 MG/2ML IJ SOLN
INTRAMUSCULAR | Status: AC
  Filled 2017-04-19: qty 2

## 2017-04-19 MED ORDER — HYDRALAZINE HCL 20 MG/ML IJ SOLN
10.0000 mg | Freq: Four times a day (QID) | INTRAMUSCULAR | Status: DC | PRN
  Administered 2017-04-19: 10 mg via INTRAVENOUS
  Filled 2017-04-19: qty 1
  Filled 2017-04-19: qty 0.5

## 2017-04-19 MED ORDER — CEFAZOLIN SODIUM-DEXTROSE 1-4 GM/50ML-% IV SOLN
1.0000 g | Freq: Once | INTRAVENOUS | Status: AC
  Administered 2017-04-19: 1 g via INTRAVENOUS

## 2017-04-19 MED ORDER — CLOPIDOGREL BISULFATE 75 MG PO TABS
75.0000 mg | ORAL_TABLET | Freq: Every day | ORAL | Status: DC
  Administered 2017-04-19 – 2017-04-20 (×2): 75 mg via ORAL
  Filled 2017-04-19 (×2): qty 1

## 2017-04-19 MED ORDER — FENTANYL CITRATE (PF) 100 MCG/2ML IJ SOLN
INTRAMUSCULAR | Status: DC | PRN
  Administered 2017-04-19: 50 ug via INTRAVENOUS
  Administered 2017-04-19: 25 ug via INTRAVENOUS
  Administered 2017-04-19: 75 ug via INTRAVENOUS
  Administered 2017-04-19 (×2): 25 ug via INTRAVENOUS

## 2017-04-19 MED ORDER — ENSURE ENLIVE PO LIQD
237.0000 mL | Freq: Two times a day (BID) | ORAL | Status: DC
  Administered 2017-04-20: 237 mL via ORAL

## 2017-04-19 MED ORDER — FENTANYL CITRATE (PF) 100 MCG/2ML IJ SOLN
INTRAMUSCULAR | Status: AC
  Filled 2017-04-19: qty 2

## 2017-04-19 MED ORDER — SUGAMMADEX SODIUM 200 MG/2ML IV SOLN
INTRAVENOUS | Status: AC
  Filled 2017-04-19: qty 2

## 2017-04-19 MED ORDER — HEPARIN SOD (PORK) LOCK FLUSH 10 UNIT/ML IV SOLN
INTRAVENOUS | Status: DC | PRN
  Administered 2017-04-19: 5000 [IU]

## 2017-04-19 MED ORDER — LACTATED RINGERS IV SOLN
INTRAVENOUS | Status: DC
Start: 2017-04-19 — End: 2017-04-20
  Administered 2017-04-19 (×2): via INTRAVENOUS

## 2017-04-19 MED ORDER — CLONIDINE HCL 0.1 MG PO TABS
0.1000 mg | ORAL_TABLET | Freq: Every day | ORAL | Status: DC
  Administered 2017-04-20: 0.1 mg via ORAL
  Filled 2017-04-19 (×2): qty 1

## 2017-04-19 MED ORDER — ONDANSETRON HCL 4 MG/2ML IJ SOLN: 4.0000 mg | Freq: Once | INTRAMUSCULAR | Status: DC | PRN

## 2017-04-19 MED ORDER — EPHEDRINE SULFATE 50 MG/ML IJ SOLN
INTRAMUSCULAR | Status: DC | PRN
  Administered 2017-04-19: 10 mg via INTRAVENOUS

## 2017-04-19 MED ORDER — SUCCINYLCHOLINE CHLORIDE 20 MG/ML IJ SOLN
INTRAMUSCULAR | Status: AC
  Filled 2017-04-19: qty 1

## 2017-04-19 MED ORDER — SUCCINYLCHOLINE CHLORIDE 20 MG/ML IJ SOLN
INTRAMUSCULAR | Status: DC | PRN
  Administered 2017-04-19: 60 mg via INTRAVENOUS

## 2017-04-19 MED ORDER — PROPOFOL 10 MG/ML IV BOLUS
INTRAVENOUS | Status: AC
  Filled 2017-04-19: qty 20

## 2017-04-19 MED ORDER — EVICEL 2 ML EX KIT
PACK | CUTANEOUS | Status: AC
  Filled 2017-04-19: qty 1

## 2017-04-19 MED ORDER — CEFAZOLIN SODIUM-DEXTROSE 1-4 GM/50ML-% IV SOLN
INTRAVENOUS | Status: AC
  Filled 2017-04-19: qty 50

## 2017-04-19 MED ORDER — DEXAMETHASONE SODIUM PHOSPHATE 10 MG/ML IJ SOLN
INTRAMUSCULAR | Status: AC
  Filled 2017-04-19: qty 1

## 2017-04-19 MED ORDER — EVICEL 2 ML EX KIT
PACK | CUTANEOUS | Status: DC | PRN
  Administered 2017-04-19: 2 mL

## 2017-04-19 MED ORDER — PHENYLEPHRINE HCL 10 MG/ML IJ SOLN
INTRAMUSCULAR | Status: DC | PRN
  Administered 2017-04-19 (×4): 50 ug via INTRAVENOUS

## 2017-04-19 MED ORDER — ATORVASTATIN CALCIUM 20 MG PO TABS
20.0000 mg | ORAL_TABLET | Freq: Every day | ORAL | Status: DC
  Administered 2017-04-19: 20 mg via ORAL
  Filled 2017-04-19: qty 1

## 2017-04-19 MED ORDER — ONDANSETRON HCL 4 MG/2ML IJ SOLN
INTRAMUSCULAR | Status: DC | PRN
  Administered 2017-04-19: 4 mg via INTRAVENOUS

## 2017-04-19 MED ORDER — LIDOCAINE HCL (CARDIAC) 20 MG/ML IV SOLN
INTRAVENOUS | Status: DC | PRN
  Administered 2017-04-19: 60 mg via INTRAVENOUS

## 2017-04-19 MED ORDER — DEXAMETHASONE SODIUM PHOSPHATE 10 MG/ML IJ SOLN
INTRAMUSCULAR | Status: DC | PRN
  Administered 2017-04-19: 8 mg via INTRAVENOUS

## 2017-04-19 MED ORDER — HEPARIN SODIUM (PORCINE) 1000 UNIT/ML IJ SOLN
INTRAMUSCULAR | Status: AC
  Filled 2017-04-19: qty 1

## 2017-04-19 MED ORDER — FENTANYL CITRATE (PF) 100 MCG/2ML IJ SOLN
25.0000 ug | INTRAMUSCULAR | Status: DC | PRN
  Administered 2017-04-19 (×4): 25 ug via INTRAVENOUS

## 2017-04-19 MED ORDER — FENTANYL CITRATE (PF) 100 MCG/2ML IJ SOLN
INTRAMUSCULAR | Status: AC
  Administered 2017-04-19: 25 ug via INTRAVENOUS
  Filled 2017-04-19: qty 2

## 2017-04-19 SURGICAL SUPPLY — 58 items
APPLIER CLIP 11 MED OPEN (CLIP)
APPLIER CLIP 9.375 SM OPEN (CLIP)
BAG COUNTER SPONGE EZ (MISCELLANEOUS) IMPLANT
BAG DECANTER FOR FLEXI CONT (MISCELLANEOUS) ×3 IMPLANT
BLADE SURG 15 STRL LF DISP TIS (BLADE) ×1 IMPLANT
BLADE SURG 15 STRL SS (BLADE) ×2
BLADE SURG SZ11 CARB STEEL (BLADE) ×3 IMPLANT
BOOT SUTURE AID YELLOW STND (SUTURE) IMPLANT
BRUSH SCRUB 4% CHG (MISCELLANEOUS) ×3 IMPLANT
CANISTER SUCT 1200ML W/VALVE (MISCELLANEOUS) ×3 IMPLANT
CATH TRAY 16F METER LATEX (MISCELLANEOUS) ×3 IMPLANT
CLIP APPLIE 11 MED OPEN (CLIP) IMPLANT
CLIP APPLIE 9.375 SM OPEN (CLIP) IMPLANT
COUNTER SPONGE BAG EZ (MISCELLANEOUS)
DERMABOND ADVANCED (GAUZE/BANDAGES/DRESSINGS) ×2
DERMABOND ADVANCED .7 DNX12 (GAUZE/BANDAGES/DRESSINGS) ×1 IMPLANT
DRAPE INCISE IOBAN 66X45 STRL (DRAPES) ×3 IMPLANT
DRAPE LAPAROTOMY 100X77 ABD (DRAPES) ×3 IMPLANT
DRAPE SHEET LG 3/4 BI-LAMINATE (DRAPES) ×3 IMPLANT
DRSG OPSITE POSTOP 4X6 (GAUZE/BANDAGES/DRESSINGS) ×3 IMPLANT
DURAPREP 26ML APPLICATOR (WOUND CARE) ×3 IMPLANT
ELECT CAUTERY BLADE 6.4 (BLADE) ×3 IMPLANT
ELECT REM PT RETURN 9FT ADLT (ELECTROSURGICAL) ×3
ELECTRODE REM PT RTRN 9FT ADLT (ELECTROSURGICAL) ×1 IMPLANT
GLOVE BIO SURGEON STRL SZ7 (GLOVE) ×6 IMPLANT
GLOVE INDICATOR 7.5 STRL GRN (GLOVE) ×3 IMPLANT
GOWN STRL REUS W/ TWL LRG LVL3 (GOWN DISPOSABLE) ×2 IMPLANT
GOWN STRL REUS W/ TWL XL LVL3 (GOWN DISPOSABLE) ×1 IMPLANT
GOWN STRL REUS W/TWL LRG LVL3 (GOWN DISPOSABLE) ×4
GOWN STRL REUS W/TWL XL LVL3 (GOWN DISPOSABLE) ×2
HEMOSTAT SURGICEL 2X3 (HEMOSTASIS) ×3 IMPLANT
IV NS 500ML (IV SOLUTION) ×2
IV NS 500ML BAXH (IV SOLUTION) ×1 IMPLANT
KIT RM TURNOVER STRD PROC AR (KITS) ×3 IMPLANT
LABEL OR SOLS (LABEL) IMPLANT
LOOP RED MAXI  1X406MM (MISCELLANEOUS) ×4
LOOP VESSEL MAXI 1X406 RED (MISCELLANEOUS) ×2 IMPLANT
LOOP VESSEL MINI 0.8X406 BLUE (MISCELLANEOUS) ×2 IMPLANT
LOOPS BLUE MINI 0.8X406MM (MISCELLANEOUS) ×4
NS IRRIG 500ML POUR BTL (IV SOLUTION) ×3 IMPLANT
PACK BASIN MAJOR ARMC (MISCELLANEOUS) ×3 IMPLANT
PATCH CAROTID ECM VASC 1X10 (Prosthesis & Implant Heart) ×3 IMPLANT
SUT PROLENE 5 0 RB 1 DA (SUTURE) IMPLANT
SUT PROLENE 6 0 BV (SUTURE) ×18 IMPLANT
SUT PROLENE 7 0 BV 1 (SUTURE) ×6 IMPLANT
SUT SILK 2 0 (SUTURE)
SUT SILK 2-0 18XBRD TIE 12 (SUTURE) IMPLANT
SUT SILK 3 0 (SUTURE)
SUT SILK 3-0 18XBRD TIE 12 (SUTURE) IMPLANT
SUT SILK 4 0 (SUTURE)
SUT SILK 4-0 18XBRD TIE 12 (SUTURE) IMPLANT
SUT VIC AB 2-0 CT1 27 (SUTURE) ×4
SUT VIC AB 2-0 CT1 TAPERPNT 27 (SUTURE) ×2 IMPLANT
SUT VIC AB 3-0 SH 27 (SUTURE)
SUT VIC AB 3-0 SH 27X BRD (SUTURE) IMPLANT
SUT VICRYL+ 3-0 36IN CT-1 (SUTURE) ×3 IMPLANT
SYR 20CC LL (SYRINGE) ×3 IMPLANT
SYR 5ML LL (SYRINGE) ×3 IMPLANT

## 2017-04-19 NOTE — Progress Notes (Addendum)
Nutrition Brief Note  Patient identified for low BMI  83 y.o. female with a known history of HTN and PVD admitted to vascular service for right LL angioplasty and stenting. Scheduled to have right CFA, PFA, and SFA endarterectomy today and planned stent placement in the distal SFA/popliteal.   Wt Readings from Last 15 Encounters:  04/18/17 100 lb (45.4 kg)  04/17/17 100 lb (45.4 kg)  04/11/17 100 lb (45.4 kg)  10/17/16 103 lb (46.7 kg)  06/08/16 105 lb (47.6 kg)    Body mass index is 18.29 kg/m. Patient meets criteria for underweight for age based on current BMI. Per chart, pt is weight stable.   Current diet order is 2gm, patient consuming approximately 100% of meals prior to NPO. Labs and medications reviewed.   RD will order Ensure Enlive po BID, each supplement provides 350 kcal and 20 grams of protein  No further nutrition interventions warranted at this time. If nutrition issues arise, please consult RD.   Koleen Distance MS, RD, LDN Pager #- [phone]

## 2017-04-19 NOTE — Transfer of Care (Signed)
Immediate Anesthesia Transfer of Care Note  Patient: Dawn [name]  Procedure(s) Performed: Procedure(s): ENDARTERECTOMY COMMON FEMORAL ARTERY, PROFUNDA  FEMORIS ARTERY, and  SUPERFICIAL FEMORAL ARTERY (Right) LOWER EXTREMITY ANGIOGRAM WITH SUPERFICIAL FEMORAL ARTERY STENT PLACEMENT (Right)  Patient Location: PACU  Anesthesia Type:General  Level of Consciousness: awake  Airway & Oxygen Therapy: Patient Spontanous Breathing and Patient connected to face mask oxygen  Post-op Assessment: Report given to RN and Post -op Vital signs reviewed and stable  Post vital signs: Reviewed  Last Vitals:  Vitals:   04/19/17 1237 04/19/17 1631  BP:  (!) 203/69  Pulse: 65 86  Resp: 16 13  Temp: 36.7 C     Last Pain:  Vitals:   04/19/17 1237  TempSrc: Oral  PainSc:          Complications: No apparent anesthesia complications

## 2017-04-19 NOTE — Op Note (Signed)
OPERATIVE NOTE   PROCEDURE: 1. Right common femoral, profunda femoris, and superficial femoral artery endarterectomies and patch angioplasty 2.   Right lower extremity angiogram with catheter placement into the right popliteal artery 3.   Viabahn stent placement to the right SFA with a 6 mm diameter by 25 cm length stent    PRE-OPERATIVE DIAGNOSIS: 1.Atherosclerotic occlusive disease right lower extremities with claudication BLE   POST-OPERATIVE DIAGNOSIS: Same  SURGEON: Leotis Pain, MD  ANESTHESIA: general  ESTIMATED BLOOD LOSS: 150 cc  FINDING(S): 1. significant plaque in right common femoral, profunda femoris, and superficial femoral arteries 2.  Significant residual stenosis in the right SFA requiring stent placement  SPECIMEN(S): Right common femoral, profunda femoris, and superficial femoral artery plaque.  INDICATIONS:  Patient presents with claudication symptoms that are disabling. She had an angiogram yesterday and had partial treatment but had common femoral and femoral artery branch occlusions on angiogram as well as residual disease in her right SFA.  Right femoral endarterectomy is planned to try to improve perfusion and stent placement in the residual disease in her right SFA is planned as well. The risks and benefits as well as alternative therapies including intervention were reviewed in detail all questions were answered the patient agrees to proceed with surgery.  DESCRIPTION: After obtaining full informed written consent, the patient was brought back to the operating room and placed supine upon the operating table. The patient received IV antibiotics prior to induction. After obtaining adequate anesthesia, the patient was prepped and draped in the standard fashion appropriate time out is called.   Vertical incision was created overlying the right femoral arteries. The common femoral artery proximally, and superficial femoral artery, and primary profunda  femoris artery branches were encircled with vessel loops and prepared for control. The right femoral arteries were found to have significant plaque from the common femoral artery into the profunda and superficial femoral arteries.   4000 units of heparin was given and allowed circulate for 5 minutes.   Attention is then turned to the right femoral artery. An arteriotomy is made with 11 blade and extended with Potts scissors in the common femoral artery and carried down onto the first 2 cm of the superficial femoral artery. An endarterectomy was then performed. [facility] was used to create a plane. The proximal endpoint was cut flush with tenotomy scissors. This was in the proximal common femoral artery. An eversion endarterectomy was then performed for the first 2-3 cm of the profunda femoris artery. Good backbleeding was then seen. The distal endpoint of the superficial femoral artery endarterectomy was created with gentle traction and the distal endpoint was reasonably clean and tacked down with a pair of 7-0 Prolene sutures. The Cormatrix patcth is then selected and prepared for a patch angioplasty.  It is cut and beveled and started at the proximal endpoint with a 6-0 Prolene suture.  Approximately one half of the suture line is run medially and laterally and the distal end point was cut and bevelled to match the arteriotomy.  A second 6-0 Prolene was started at the distal end point and run to the mid portion to complete the arteriotomy.  The vessel was flushed prior to release of control and completion of the anastomosis.  At this point, flow was established first to the profunda femoris artery and then to the superficial femoral artery. Easily palpable pulses are noted well beyond the anastomosis and both arteries. I then placed a 6 French sheath through the common femoral  artery into the superficial femoral artery. Selective right lower extremity angiogram was then performed showing the  nearly occlusive stenosis residual on the SFA. Across this without difficulty with the advantage wire and a Kumpe catheter and confirm intraluminal flow in the popliteal artery. I then placed a 0.018 wire and selected a 6 mm diameter by 25 cm length Viabahn stent. This was deployed from the distal superficial femoral artery up to about 2-3 cm beyond the suture line for the endarterectomy area this was postdilated with a 5 mm balloon. Angiogram following stent placement showed the stent to be widely patent with less than 10% residual stenosis and good flow distally. The sheath was removed and 6-0 Monocryl suture was placed. Surgicel and Evicel topical hemostatic agents were placed in the femoral incision and hemostasis was complete. The femoral incision was then closed in a layered fashion with 2 layers of 2-0 Vicryl, 2 layers of 3-0 Vicryl, and 4-0 Monocryl for the skin closure. Dermabond and sterile dressing were then placed over the incision.  The patient was then awakened from anesthesia and taken to the recovery room in stable condition having tolerated the procedure well.  COMPLICATIONS: None  CONDITION: Stable     Leotis Pain 04/19/2017 4:30 PM   This note was created with Dragon Medical transcription system. Any errors in dictation are purely unintentional.

## 2017-04-19 NOTE — Anesthesia Post-op Follow-up Note (Cosign Needed)
Anesthesia QCDR form completed.        

## 2017-04-19 NOTE — Progress Notes (Addendum)
[facility] NAME: Keniya Schlotterbeck    MR#:  [id number]  DATE OF BIRTH:  01/13/1928  SUBJECTIVE:  Getting ready to go for surgery  REVIEW OF SYSTEMS:   Review of Systems  Constitutional: Negative for chills, fever and weight loss.  HENT: Negative for ear discharge, ear pain and nosebleeds.   Eyes: Negative for blurred vision, pain and discharge.  Respiratory: Negative for sputum production, shortness of breath, wheezing and stridor.   Cardiovascular: Negative for chest pain, palpitations, orthopnea and PND.  Gastrointestinal: Negative for abdominal pain, diarrhea, nausea and vomiting.  Genitourinary: Negative for frequency and urgency.  Musculoskeletal: Negative for back pain and joint pain.  Neurological: Positive for weakness. Negative for sensory change, speech change and focal weakness.  Psychiatric/Behavioral: Negative for depression and hallucinations. The patient is not nervous/anxious.    Tolerating Diet:npo  Tolerating PT: pending  DRUG ALLERGIES:   Allergies  Allergen Reactions  . Percocet [Oxycodone-Acetaminophen] Itching  . Percodan [Oxycodone-Aspirin] Itching    VITALS:  Blood pressure (!) 152/44, pulse 65, temperature 98.1 F (36.7 C), temperature source Oral, resp. rate 16, height 5\' 2"  (1.575 m), weight 45.4 kg (100 lb), SpO2 99 %.  PHYSICAL EXAMINATION:   Physical Exam  GENERAL:  83 y.o.-year-old patient lying in the bed with no acute distress.  EYES: Pupils equal, round, reactive to light and accommodation. No scleral icterus. Extraocular muscles intact.  HEENT: Head atraumatic, normocephalic. Oropharynx and nasopharynx clear.  NECK:  Supple, no jugular venous distention. No thyroid enlargement, no tenderness.  LUNGS: Normal breath sounds bilaterally, no wheezing, rales, rhonchi. No use of accessory muscles of respiration.  CARDIOVASCULAR: S1, S2 normal. No murmurs, rubs, or gallops.  ABDOMEN:  Soft, nontender, nondistended. Bowel sounds present. No organomegaly or mass.  EXTREMITIES: No cyanosis, clubbing or edema b/l.    NEUROLOGIC: Cranial nerves II through XII are intact. No focal Motor or sensory deficits b/l.   PSYCHIATRIC:  patient is alert and oriented x 3.  SKIN: No obvious rash, lesion, or ulcer.   LABORATORY PANEL:  CBC  Recent Labs Lab 04/18/17 1437  WBC 4.9  HGB 13.6  HCT 39.8  PLT 170    Chemistries   Recent Labs Lab 04/17/17 1100  BUN 41*  CREATININE 1.22*   Cardiac Enzymes No results for input(s): TROPONINI in the last 168 hours. RADIOLOGY:  No results found. ASSESSMENT AND PLAN:  Rebeka Kimble  is a 83 y.o. female with a known history of Htn, PVD- admitted to vascular service for right LL angioplasty and stenting. IM consulted for HTN  * Hypertension   Clonidine and HCTz, cont for now.   prn hydralazine  * PVD   S/p angioplasty RLL, manage per vascular team.   On heparin drip. Pt is scheduled to go for Right LE CFA, PFA and SFA endarterectomy  Case discussed with Care Management/Social Worker. Management plans discussed with the patient, family and they are in agreement.  CODE STATUS: FULL  DVT Prophylaxis: Heparin gtt  TOTAL TIME TAKING CARE OF THIS PATIENT: *30* minutes.  >50% time spent on counselling and coordination of care  POSSIBLE D/C IN *1-2* DAYS, DEPENDING ON CLINICAL CONDITION.  Note: This dictation was prepared with Dragon dictation along with smaller phrase technology. Any transcriptional errors that result from this process are unintentional.  Saylee Sherrill M.D on 04/19/2017 at 1:22 PM  Between 7am to 6pm - Pager - [phone]  After 6pm go to [email or web address] -  password [email or web address]  Office  [phone]  CC: Primary care physician; Idelle Crouch, MD

## 2017-04-19 NOTE — Progress Notes (Signed)
ANTICOAGULATION CONSULT NOTE - Initial Consult  Pharmacy Consult for Heparin  Indication: claudication of lower extremities  Allergies  Allergen Reactions  . Percocet [Oxycodone-Acetaminophen] Itching  . Percodan [Oxycodone-Aspirin] Itching    Patient Measurements: Height: 5\' 2"  (157.5 cm) Weight: 100 lb (45.4 kg) IBW/kg (Calculated) : 50.1 Heparin Dosing Weight:   Vital Signs: Temp: 97.6 F (36.4 C) (05/23 2044) Temp Source: Oral (05/23 2044) BP: 182/43 (05/23 2046) Pulse Rate: 57 (05/23 2046)  Labs:  Recent Labs  04/17/17 1100 04/18/17 1437 04/19/17 0409  HGB  --  13.6  --   HCT  --  39.8  --   PLT  --  170  --   APTT  --  64*  --   LABPROT  --  13.4  --   INR  --  1.02  --   HEPARINUNFRC  --   --  0.50  CREATININE 1.22*  --   --     Estimated Creatinine Clearance: 23.7 mL/min (A) (by C-G formula based on SCr of 1.22 mg/dL (H)).   Medical History: Past Medical History:  Diagnosis Date  . 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 Hypertension   . Sciatica   . Sciatica     Medications:  Scheduled:  . aspirin EC  81 mg Oral Daily  . chlorhexidine  60 mL Topical Once  . cholecalciferol  1,000 Units Oral Daily  . cloNIDine  0.1-0.2 mg Oral Daily  . cloNIDine  0.2 mg Oral QPM  . hydrochlorothiazide  25 mg Oral Daily  . ketorolac  1 drop Left Eye QID  . multivitamin with minerals  1 tablet Oral QHS  . naproxen sodium  550 mg Oral Daily  . vitamin B-12  50 mcg Oral Daily    Assessment: 83 year old female on heparin gtt (600 units/hr) prior to vascular surgery on 5/24 AM.   Goal of Therapy:    Plan:  Per Dr Lucky Cowboy, will run heparin drip at steady rate of 600 units/hr until vascular procedure scheduled for 5/24 AM.   Do not adjust dose per nomogram.   0523 1800: ordered HL for the AM as patient is 45.4 kg to make sure HL is not considerably high.   5/24 @ 0409 HL 0.50 therapeutic. No adjustments per  vascular -- follow up to see if drip still needed after procedure.  Tobie Lords, PharmD, BCPS Clinical Pharmacist 04/19/2017

## 2017-04-19 NOTE — Anesthesia Preprocedure Evaluation (Signed)
Anesthesia Evaluation  Patient identified by MRN, date of birth, ID band Patient awake    Reviewed: Allergy & Precautions, H&P , NPO status , Patient's Chart, lab work & pertinent test results, reviewed documented beta blocker date and time   History of Anesthesia Complications (+) Family history of anesthesia reaction and history of anesthetic complications  Airway Mallampati: II  TM Distance: >3 FB Neck ROM: full    Dental  (+) Edentulous Upper, Upper Dentures, Partial Lower, Dental Advidsory Given   Pulmonary neg shortness of breath, asthma , neg sleep apnea, neg COPD, neg recent URI, former smoker,           Cardiovascular Exercise Tolerance: Good hypertension, On Medications (-) angina(-) CAD, (-) Past MI, (-) Cardiac Stents and (-) CABG (-) dysrhythmias (-) Valvular Problems/Murmurs     Neuro/Psych neg Seizures  Neuromuscular disease negative psych ROS   GI/Hepatic negative GI ROS, Neg liver ROS,   Endo/Other  negative endocrine ROS  Renal/GU negative Renal ROS  negative genitourinary   Musculoskeletal   Abdominal   Peds  Hematology negative hematology ROS (+)   Anesthesia Other Findings Past Medical History: No date: Family history of adverse reaction to anesthes*     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 No date: Hypertension No date: Sciatica No date: Sciatica   Reproductive/Obstetrics negative OB ROS                             Anesthesia Physical Anesthesia Plan  ASA: II  Anesthesia Plan: General   Post-op Pain Management:    Induction:   Airway Management Planned:   Additional Equipment:   Intra-op Plan:   Post-operative Plan:   Informed Consent: I have reviewed the patients History and Physical, chart, labs and discussed the procedure including the risks, benefits and alternatives for the proposed anesthesia with the patient or authorized representative  who has indicated his/her understanding and acceptance.   Dental Advisory Given  Plan Discussed with: Anesthesiologist, CRNA and Surgeon  Anesthesia Plan Comments:         Anesthesia Quick Evaluation

## 2017-04-19 NOTE — H&P (Signed)
[facility] History & Physical Update  The patient was interviewed and re-examined.  The patient's previous History and Physical has been reviewed and is unchanged.  There is no change in the plan of care. We plan to proceed with the scheduled procedure.  Leotis Pain, MD  04/19/2017, 12:47 PM

## 2017-04-19 NOTE — Anesthesia Procedure Notes (Signed)
Procedure Name: Intubation Performed by: Demetrius Charity Pre-anesthesia Checklist: Patient identified, Patient being monitored, Timeout performed, Emergency Drugs available and Suction available Patient Re-evaluated:Patient Re-evaluated prior to inductionOxygen Delivery Method: Circle system utilized Preoxygenation: Pre-oxygenation with 100% oxygen Intubation Type: IV induction Ventilation: Mask ventilation without difficulty Laryngoscope Size: Mac and 3 Grade View: Grade I Tube type: Oral Tube size: 7.0 mm Number of attempts: 1 Airway Equipment and Method: Stylet Placement Confirmation: ETT inserted through vocal cords under direct vision,  positive ETCO2 and breath sounds checked- equal and bilateral Secured at: 21 cm Tube secured with: Tape Dental Injury: Teeth and Oropharynx as per pre-operative assessment

## 2017-04-20 MED ORDER — NAPROXEN 250 MG PO TABS
500.0000 mg | ORAL_TABLET | Freq: Every day | ORAL | Status: DC
  Administered 2017-04-20: 500 mg via ORAL
  Filled 2017-04-20: qty 2

## 2017-04-20 MED ORDER — CLOPIDOGREL BISULFATE 75 MG PO TABS: 75.0000 mg | ORAL_TABLET | Freq: Every day | ORAL | 3 refills | Status: DC

## 2017-04-20 MED ORDER — HYDROMORPHONE HCL 1 MG/ML IJ SOLN: 1.0000 mg | Freq: Once | INTRAMUSCULAR | Status: DC | PRN

## 2017-04-20 MED ORDER — ATORVASTATIN CALCIUM 20 MG PO TABS: 20.0000 mg | ORAL_TABLET | Freq: Every day | ORAL | 3 refills | Status: DC

## 2017-04-20 MED ORDER — TRAMADOL HCL 50 MG PO TABS: 50.0000 mg | ORAL_TABLET | Freq: Four times a day (QID) | ORAL | 1 refills | Status: DC | PRN

## 2017-04-20 NOTE — Care Management Important Message (Signed)
Important Message  Patient Details  Name: Dawn [name] MRN: [id number] Date of Birth: 14-Dec-1927   Medicare Important Message Given:  N/A - LOS <3 / Initial given by admissions    Beverly Sessions, RN 04/20/2017, 11:25 AM

## 2017-04-20 NOTE — Progress Notes (Signed)
Pt to be discharged per MD order. IV removed. Instructions reviewed with pt and family. All questions answered and they verbalize understanding of discharge teaching. Foley removed and pt has since voided. Scripts included in discharge packet. Will discharge pt in wheelchair.

## 2017-04-20 NOTE — Progress Notes (Signed)
Dawn [name] NAME: Dawn [name]    MR#:  [id number]  DATE OF BIRTH:  1928-09-21  SUBJECTIVE:  Doing well post surgery  REVIEW OF SYSTEMS:   Review of Systems  Constitutional: Negative for chills, fever and weight loss.  HENT: Negative for ear discharge, ear pain and nosebleeds.   Eyes: Negative for blurred vision, pain and discharge.  Respiratory: Negative for sputum production, shortness of breath, wheezing and stridor.   Cardiovascular: Negative for chest pain, palpitations, orthopnea and PND.  Gastrointestinal: Negative for abdominal pain, diarrhea, nausea and vomiting.  Genitourinary: Negative for frequency and urgency.  Musculoskeletal: Negative for back pain and joint pain.  Neurological: Positive for weakness. Negative for sensory change, speech change and focal weakness.  Psychiatric/Behavioral: Negative for depression and hallucinations. The patient is not nervous/anxious.    Tolerating Diet:Regular Tolerating PT: pending  DRUG ALLERGIES:   Allergies  Allergen Reactions  . Percocet [Oxycodone-Acetaminophen] Itching  . Percodan [Oxycodone-Aspirin] Itching    VITALS:  Blood pressure (!) 150/54, pulse 92, temperature 98.1 F (36.7 C), temperature source Oral, resp. rate 16, height 5\' 2"  (1.575 m), weight 45.4 kg (100 lb), SpO2 95 %.  PHYSICAL EXAMINATION:   Physical Exam  GENERAL:  83 y.o.-year-old patient lying in the bed with no acute distress.  EYES: Pupils equal, round, reactive to light and accommodation. No scleral icterus. Extraocular muscles intact.  HEENT: Head atraumatic, normocephalic. Oropharynx and nasopharynx clear.  NECK:  Supple, no jugular venous distention. No thyroid enlargement, no tenderness.  LUNGS: Normal breath sounds bilaterally, no wheezing, rales, rhonchi. No use of accessory muscles of respiration.  CARDIOVASCULAR: S1, S2 normal. No murmurs, rubs, or gallops.  ABDOMEN: Soft,  nontender, nondistended. Bowel sounds present. No organomegaly or mass.  EXTREMITIES: No cyanosis, clubbing or edema b/l.   Right LE dressing+ NEUROLOGIC: Cranial nerves II through XII are intact. No focal Motor or sensory deficits b/l.   PSYCHIATRIC:  patient is alert and oriented x 3.  SKIN: No obvious rash, lesion, or ulcer.   LABORATORY PANEL:  CBC  Recent Labs Lab 04/18/17 1437  WBC 4.9  HGB 13.6  HCT 39.8  PLT 170    Chemistries   Recent Labs Lab 04/17/17 1100  BUN 41*  CREATININE 1.22*   Cardiac Enzymes No results for input(s): TROPONINI in the last 168 hours. RADIOLOGY:  Dg C-arm 1-60 Min-no Report  Result Date: 04/19/2017 Fluoroscopy was utilized by the requesting physician.  No radiographic interpretation.   ASSESSMENT AND PLAN:  Dawn [name]  is a 83 y.o. female with a known history of Htn, PVD- admitted to vascular service for right LL angioplasty and stenting. IM consulted for HTN  * Hypertension-stbale   Clonidine and HCTz, cont for now.   prn hydralazine  * PVD - S/p angioplasty RLL, manage per vascular team. -was On heparin drip. -s/p Right LE CFA, PFA and SFA endarterectomy. Doing well -PT to see pt. -On Asa and plavix  Plans per Dr Dawn [name] for pt to go home later today D/c foley  Will sign off. Thanks!  Case discussed with Care Management/Social Worker. Management plans discussed with the patient, family and they are in agreement.  CODE STATUS: FULL  DVT Prophylaxis: Heparin gtt  TOTAL TIME TAKING CARE OF THIS PATIENT: *30* minutes.  >50% time spent on counselling and coordination of care  POSSIBLE D/C IN *1-2* DAYS, DEPENDING ON CLINICAL CONDITION.  Note: This dictation was prepared with Dawn [name]  dictation along with smaller phrase technology. Any transcriptional errors that result from this process are unintentional.  Dawn [name] M.D on 04/20/2017 at 8:51 AM  Between 7am to 6pm - Pager - [phone]  After 6pm go to [email or web address]  - password [email or web address] [facility]  Office  [phone]  CC: Primary care physician; Dawn Crouch, MD

## 2017-04-20 NOTE — Discharge Summary (Addendum)
[facility]    Discharge Summary    Patient ID:  Dawn [name] MRN: [id number] DOB/AGE: Aug 12, 1928 83 y.o.  Admit date: 04/18/2017 Discharge date: 04/20/2017 Date of Surgery: 04/18/2017 - 04/19/2017 Surgeon: Juliann Mule): Lucky Cowboy Erskine Squibb, MD  Admission Diagnosis: Atherosclerosis with claudication of extremity [facility]) [I70.219]  Discharge Diagnoses:  Atherosclerosis with claudication of extremity ([place]) [I70.219]  Secondary Diagnoses: Past Medical History:  Diagnosis Date  . 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 Hypertension   . Sciatica   . Sciatica     Procedure(s): ENDARTERECTOMY COMMON FEMORAL ARTERY, PROFUNDA  FEMORIS ARTERY, and  SUPERFICIAL FEMORAL ARTERY LOWER EXTREMITY ANGIOGRAM WITH SUPERFICIAL FEMORAL ARTERY STENT PLACEMENT  Discharged Condition: good  HPI:  Patient with PAD and lifestyle limited symptoms in the lower extremities, R>L  Hospital Course:  Dawn [name] is a 83 y.o. female is S/P Right Procedure(s): ENDARTERECTOMY COMMON FEMORAL ARTERY, PROFUNDA  FEMORIS ARTERY, and  SUPERFICIAL FEMORAL ARTERY LOWER EXTREMITY ANGIOGRAM WITH SUPERFICIAL FEMORAL ARTERY STENT PLACEMENT Extubated: POD # 0 Physical exam: foot warm, palpable pulse Post-op wounds clean, dry, intact or healing well Pt. Ambulating, voiding and taking PO diet without difficulty. Pt pain controlled with PO pain meds. Labs as below Complications:none  Consults:  Treatment Team:  Vaughan Basta, MD Fritzi Mandes, MD  Significant Diagnostic Studies: CBC Lab Results  Component Value Date   WBC 4.9 04/18/2017   HGB 13.6 04/18/2017   HCT 39.8 04/18/2017   MCV 93.0 04/18/2017   PLT 170 04/18/2017    BMET    Component Value Date/Time   NA 138 06/08/2016 1458   K 3.5 06/08/2016 1458   CL 98 (L) 06/08/2016 1458   CO2 30 06/08/2016 1458   GLUCOSE 91 06/08/2016 1458   BUN 41 (H)  04/17/2017 1100   CREATININE 1.22 (H) 04/17/2017 1100   CREATININE 0.92 03/13/2012 0805   CALCIUM 9.6 06/08/2016 1458   GFRNONAA 39 (L) 04/17/2017 1100   GFRNONAA 58 (L) 03/13/2012 0805   GFRAA 45 (L) 04/17/2017 1100   GFRAA >60 03/13/2012 0805   COAG Lab Results  Component Value Date   INR 1.02 04/18/2017     Disposition:  Discharge to :Home Discharge Instructions    Call MD for:  redness, tenderness, or signs of infection (pain, swelling, bleeding, redness, odor or green/yellow discharge around incision site)    Complete by:  As directed    Call MD for:  severe or increased pain, loss or decreased feeling  in affected limb(s)    Complete by:  As directed    Call MD for:  temperature >100.5    Complete by:  As directed    Driving Restrictions    Complete by:  As directed    No driving for three days   Lifting restrictions    Complete by:  As directed    No lifting for three days   No dressing needed    Complete by:  As directed    Replace only if drainage present   Resume previous diet    Complete by:  As directed       Verbal and written Discharge instructions given to the patient. Wound care per Discharge AVS Follow-up Information    Dew, Erskine Squibb, MD Follow up in 1 week(s).   Specialties:  Vascular Surgery, Radiology, Interventional Cardiology Contact information: [address] [place] [address] [phone]  Signed: Leotis Pain, MD  04/20/2017, 8:51 AM

## 2017-04-20 NOTE — Progress Notes (Signed)
[facility]  Daily Progress Note   Subjective  - 1 Day Post-Op  Very happy she can feel her foot and wiggle her toes.  No events overnight  Objective Vitals:   04/19/17 1900 04/19/17 1915 04/19/17 2016 04/20/17 0511  BP: (!) 137/46 (!) 137/47 (!) 138/55 (!) 150/54  Pulse: 90 93 90 92  Resp:   16 16  Temp:   97.5 F (36.4 C) 98.1 F (36.7 C)  TempSrc:   Oral Oral  SpO2:   100% 95%  Weight:      Height:        Intake/Output Summary (Last 24 hours) at 04/20/17 0843 Last data filed at 04/20/17 0753  Gross per 24 hour  Intake           1125.5 ml  Output              850 ml  Net            275.5 ml    PULM  CTAB CV  RRR VASC  Palpable DP pulse on the right.  Incision c/d/i  Laboratory CBC    Component Value Date/Time   WBC 4.9 04/18/2017 1437   HGB 13.6 04/18/2017 1437   HCT 39.8 04/18/2017 1437   PLT 170 04/18/2017 1437    BMET    Component Value Date/Time   NA 138 06/08/2016 1458   K 3.5 06/08/2016 1458   CL 98 (L) 06/08/2016 1458   CO2 30 06/08/2016 1458   GLUCOSE 91 06/08/2016 1458   BUN 41 (H) 04/17/2017 1100   CREATININE 1.22 (H) 04/17/2017 1100   CREATININE 0.92 03/13/2012 0805   CALCIUM 9.6 06/08/2016 1458   GFRNONAA 39 (L) 04/17/2017 1100   GFRNONAA 58 (L) 03/13/2012 0805   GFRAA 45 (L) 04/17/2017 1100   GFRAA >60 03/13/2012 0805    Assessment/Planning: POD #1 s/p right femoral endarterectomy and SFA stent   Doing well  Ambulate  If able to walk and pain controlled, home later today    Leotis Pain  04/20/2017, 8:43 AM

## 2017-04-24 ENCOUNTER — Encounter: Payer: Self-pay | Admitting: Vascular Surgery

## 2017-04-24 LAB — SURGICAL PATHOLOGY

## 2017-04-24 NOTE — Anesthesia Postprocedure Evaluation (Signed)
Anesthesia Post Note  Patient: Dawn [name]  Procedure(s) Performed: Procedure(s) (LRB): ENDARTERECTOMY COMMON FEMORAL ARTERY, PROFUNDA  FEMORIS ARTERY, and  SUPERFICIAL FEMORAL ARTERY (Right) LOWER EXTREMITY ANGIOGRAM WITH SUPERFICIAL FEMORAL ARTERY STENT PLACEMENT (Right)  Patient location during evaluation: PACU Anesthesia Type: General Level of consciousness: awake and alert and oriented Pain management: pain level controlled Vital Signs Assessment: post-procedure vital signs reviewed and stable Respiratory status: spontaneous breathing Cardiovascular status: blood pressure returned to baseline Anesthetic complications: no     Last Vitals:  Vitals:   04/19/17 2016 04/20/17 0511  BP: (!) 138/55 (!) 150/54  Pulse: 90 92  Resp: 16 16  Temp: 36.4 C 36.7 C    Last Pain:  Vitals:   04/20/17 1043  TempSrc:   PainSc: Asleep                 Doren Kaspar

## 2017-04-27 ENCOUNTER — Ambulatory Visit (INDEPENDENT_AMBULATORY_CARE_PROVIDER_SITE_OTHER): Payer: Medicare Other | Admitting: Vascular Surgery

## 2017-04-27 MED FILL — Sodium Chloride Irrigation Soln 0.9%: Qty: 500 | Status: AC

## 2017-04-27 MED FILL — Heparin Sodium (Porcine) Inj 5000 Unit/ML: INTRAMUSCULAR | Qty: 1 | Status: AC

## 2017-04-30 ENCOUNTER — Inpatient Hospital Stay: Admission: RE | Admit: 2017-04-30 | Payer: Medicare Other | Source: Ambulatory Visit

## 2017-05-02 ENCOUNTER — Encounter: Admission: RE | Payer: Self-pay | Source: Ambulatory Visit

## 2017-05-02 ENCOUNTER — Ambulatory Visit: Admission: RE | Admit: 2017-05-02 | Payer: Medicare Other | Source: Ambulatory Visit | Admitting: Vascular Surgery

## 2017-05-02 SURGERY — LOWER EXTREMITY ANGIOGRAPHY
Anesthesia: Moderate Sedation | Laterality: Left

## 2017-05-09 ENCOUNTER — Ambulatory Visit (INDEPENDENT_AMBULATORY_CARE_PROVIDER_SITE_OTHER): Payer: Medicare Other | Admitting: Vascular Surgery

## 2017-05-09 ENCOUNTER — Encounter (INDEPENDENT_AMBULATORY_CARE_PROVIDER_SITE_OTHER): Payer: Self-pay | Admitting: Vascular Surgery

## 2017-05-09 VITALS — BP 192/80 | HR 66 | Resp 16 | Wt 103.0 lb

## 2017-05-09 DIAGNOSIS — I10 Essential (primary) hypertension: Secondary | ICD-10-CM

## 2017-05-09 DIAGNOSIS — E785 Hyperlipidemia, unspecified: Secondary | ICD-10-CM | POA: Insufficient documentation

## 2017-05-09 DIAGNOSIS — I739 Peripheral vascular disease, unspecified: Secondary | ICD-10-CM

## 2017-05-09 NOTE — Progress Notes (Signed)
Subjective:    Patient ID: Dawn [name], female    DOB: 1927/12/12, 83 y.o.   MRN: [id number] Chief Complaint  Patient presents with  . Post-op follow up   Patient presents for her first postoperative follow-up. The patient is status post a right femoral endarterectomy with stent placement on 04/19/2017. The patient's postoperative course has been unremarkable with the exception of right lower extremity edema. The edema is associated with some mild discomfort. The patient has not removed the OR dressing. Patient states being told she may need to have the same surgery to her left lower extremity. Patient denies any fever, nausea or vomiting.   Review of Systems  Constitutional: Negative.   HENT: Negative.   Eyes: Negative.   Respiratory: Negative.   Cardiovascular: Positive for leg swelling.  Gastrointestinal: Negative.   Endocrine: Negative.   Genitourinary: Negative.   Musculoskeletal: Negative.   Skin: Negative.   Allergic/Immunologic: Negative.   Neurological: Negative.   Hematological: Negative.   Psychiatric/Behavioral: Negative.       Objective:   Physical Exam  Constitutional: She is oriented to person, place, and time. She appears well-developed and well-nourished. No distress.  HENT:  Head: Normocephalic and atraumatic.  Eyes: Conjunctivae are normal. Pupils are equal, round, and reactive to light.  Neck: Normal range of motion.  Cardiovascular: Normal rate, regular rhythm, normal heart sounds and intact distal pulses.   Pulses:      Radial pulses are 2+ on the right side, and 2+ on the left side.  Unable to palpate right lower extremity pedal pulses due to edema. Unable to palpate left lower extremity. Pulses due to peripheral artery disease. However both feet are relatively warm skin is intact  Pulmonary/Chest: Effort normal.  Musculoskeletal: She exhibits edema (Moderate right lower extremity edema up to the thigh. There is no cellulitis.).    Neurological: She is alert and oriented to person, place, and time.  Skin: She is not diaphoretic.     Patient's right groin: OR dressing removed. Incision is intact. Dermabond is intact. Healing well. There is no signs of infection.  Psychiatric: She has a normal mood and affect. Her behavior is normal. Judgment and thought content normal.  Vitals reviewed.  BP (!) 192/80   Pulse 66   Resp 16   Wt 103 lb (46.7 kg)   BMI 18.84 kg/m   Past Medical History:  Diagnosis Date  . 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 Hypertension   . Sciatica   . Sciatica    Social History   Social History  . Marital status: Widowed    Spouse name: N/A  . Number of children: N/A  . Years of education: N/A   Occupational History  . Not on file.   Social History Main Topics  . Smoking status: Former Smoker    Types: Cigarettes  . Smokeless tobacco: Never Used  . Alcohol use No  . Drug use: No  . Sexual activity: Not on file   Other Topics Concern  . Not on file   Social History Narrative  . No narrative on file   Past Surgical History:  Procedure Laterality Date  . ABDOMINAL HYSTERECTOMY    . CHOLECYSTECTOMY    . ENDARTERECTOMY FEMORAL Right 04/19/2017   Procedure: ENDARTERECTOMY COMMON FEMORAL ARTERY, PROFUNDA  FEMORIS ARTERY, and  SUPERFICIAL FEMORAL ARTERY;  Surgeon: Algernon Huxley, MD;  Location: [facility];  Service: Vascular;  Laterality: Right;  . HIP SURGERY Bilateral   . LOWER EXTREMITY ANGIOGRAM Right 04/19/2017   Procedure: LOWER EXTREMITY ANGIOGRAM WITH SUPERFICIAL FEMORAL ARTERY STENT PLACEMENT;  Surgeon: Algernon Huxley, MD;  Location: [facility] ORS;  Service: Vascular;  Laterality: Right;  . LOWER EXTREMITY ANGIOGRAPHY Right 04/18/2017   Procedure: Lower Extremity Angiography;  Surgeon: Algernon Huxley, MD;  Location: [facility];  Service: Cardiovascular;  Laterality: Right;  . SHOULDER SURGERY Bilateral   . TOTAL HIP  ARTHROPLASTY Bilateral    Family History  Problem Relation Age of Onset  . Diabetes Father   . Heart disease Father    Allergies  Allergen Reactions  . Percocet [Oxycodone-Acetaminophen] Itching  . Percodan [Oxycodone-Aspirin] Itching      Assessment & Plan:  Patient presents for her first postoperative follow-up. The patient is status post a right femoral endarterectomy with stent placement on 04/19/2017. The patient's postoperative course has been unremarkable with the exception of right lower extremity edema. The edema is associated with some mild discomfort. The patient has not removed the OR dressing. Patient states being told she may need to have the same surgery to her left lower extremity. Patient denies any fever, nausea or vomiting.  1. PVD (peripheral vascular disease) ([place]) - Stable Patient is status post a right femoral endarterectomy. Patient's postoperative course is unremarkable with the exception of some right lower extremity edema. I believe this to be edema from reperfusion syndrome. Encourage the patient elevate her legs. We'll bring patient back for an aorta iliac duplex and ABI to assess if left femoral endarterectomy is indicated. Patient to return in 1 month Patient told showering is okay. And to keep her groin clean and dry.  - VAS US AORTA/IVC/ILIACS; Future - VAS [place] ABI WITH/WO TBI; Future  2. Essential hypertension - stable Encouraged good control as its slows the progression of atherosclerotic disease  3. Hyperlipidemia, unspecified hyperlipidemia type - stable Encouraged good control as its slows the progression of atherosclerotic disease  Current Outpatient Prescriptions on File Prior to Visit  Medication Sig Dispense Refill  . aspirin EC 81 MG tablet Take 81 mg by mouth daily.    Marland Kitchen atorvastatin (LIPITOR) 20 MG tablet Take 1 tablet (20 mg total) by mouth daily. 30 tablet 3  . atorvastatin (LIPITOR) 20 MG tablet Take 1 tablet (20 mg total) by mouth  daily at 6 PM. 30 tablet 3  . Cholecalciferol (VITAMIN D3 PO) Take 1 tablet by mouth daily.    . cloNIDine (CATAPRES) 0.1 MG tablet Take 1 tablet (0.1 mg total) by mouth 2 (two) times daily. (Patient taking differently: Take 0.1-0.2 mg by mouth 2 (two) times daily. 0.1 mg in the morning & 0.2 mg in the evening) 60 tablet 0  . clopidogrel (PLAVIX) 75 MG tablet Take 1 tablet (75 mg total) by mouth daily. 30 tablet 3  . Cyanocobalamin (VITAMIN B-12 PO) Take 1 tablet by mouth daily.    . hydrochlorothiazide (HYDRODIURIL) 25 MG tablet Take 25 mg by mouth daily.     . indomethacin (INDOCIN) 50 MG capsule Take 50 mg by mouth 2 (two) times daily as needed (for gout flares).    Marland Kitchen ketorolac (ACULAR) 0.4 % SOLN Place 1 drop into the left eye 4 (four) times daily.    . Multiple Minerals-Vitamins (LEG-GESIC PO) Take 1 tablet by mouth at bedtime. LEGATRIN PM    . naproxen sodium (ANAPROX) 220 MG tablet Take 440 mg by mouth daily.    Marland Kitchen  traMADol (ULTRAM) 50 MG tablet Take 1 tablet (50 mg total) by mouth every 6 (six) hours as needed. 30 tablet 1  . tretinoin (RETIN-A) 0.05 % cream Apply 1 application topically every other day. EVERY OTHER NIGHT     No current facility-administered medications on file prior to visit.     There are no Patient Instructions on file for this visit. No Follow-up on file.   Demia Viera A Fionnuala Hemmerich, PA-C

## 2017-06-15 ENCOUNTER — Encounter (INDEPENDENT_AMBULATORY_CARE_PROVIDER_SITE_OTHER): Payer: Self-pay

## 2017-06-15 ENCOUNTER — Ambulatory Visit (INDEPENDENT_AMBULATORY_CARE_PROVIDER_SITE_OTHER): Payer: Medicare Other

## 2017-06-15 ENCOUNTER — Other Ambulatory Visit (INDEPENDENT_AMBULATORY_CARE_PROVIDER_SITE_OTHER): Payer: Self-pay | Admitting: Vascular Surgery

## 2017-06-15 ENCOUNTER — Ambulatory Visit (INDEPENDENT_AMBULATORY_CARE_PROVIDER_SITE_OTHER): Payer: Medicare Other | Admitting: Vascular Surgery

## 2017-06-15 ENCOUNTER — Other Ambulatory Visit (INDEPENDENT_AMBULATORY_CARE_PROVIDER_SITE_OTHER): Payer: Self-pay

## 2017-06-15 VITALS — BP 164/68 | HR 56 | Resp 19 | Ht 62.5 in | Wt 98.0 lb

## 2017-06-15 DIAGNOSIS — E785 Hyperlipidemia, unspecified: Secondary | ICD-10-CM

## 2017-06-15 DIAGNOSIS — I701 Atherosclerosis of renal artery: Secondary | ICD-10-CM | POA: Insufficient documentation

## 2017-06-15 DIAGNOSIS — I739 Peripheral vascular disease, unspecified: Secondary | ICD-10-CM

## 2017-06-15 DIAGNOSIS — I70219 Atherosclerosis of native arteries of extremities with intermittent claudication, unspecified extremity: Secondary | ICD-10-CM

## 2017-06-15 NOTE — Assessment & Plan Note (Signed)
The patient has renal artery stenosis with poorly controlled hypertension on multiple medications. I think at this point, intervention would be reasonable to try to improve her renal artery perfusion and improve her blood pressure control and save any renal function. Risks and benefits were discussed. We will do this before her left lower extremity revascularization as we can use the left groin.

## 2017-06-15 NOTE — Assessment & Plan Note (Signed)
lipid control important in reducing the progression of atherosclerotic disease. Continue statin therapy  

## 2017-06-15 NOTE — Assessment & Plan Note (Signed)
Her right lower extremity is markedly improved after intervention. She is still bothered by lifestyle limiting claudication on the left leg. We will plan a left lower extremity angiogram in a few weeks after her right groin incision is completely healed. In the interim we will try to address her renal artery stenosis to improve her renovascular hypertension.

## 2017-06-15 NOTE — Progress Notes (Signed)
Patient ID: Dawn [name], female   DOB: 07-30-1928, 83 y.o.   MRN: [id number]  Chief Complaint  Patient presents with  . Re-evaluation    1 month followup    HPI Dawn [name] is a 83 y.o. female.  Patient returns in follow-up of multiple vascular issues. She has undergone extensive right lower extremity revascularization for rest pain. Her right leg now feels great. She is undergone extensive femoral endarterectomy, tibial angioplasty, and SFA stent placement her noninvasive studies show occlusion of the SFA stent with a reduced ABI but her digital waveform is excellent. Her left ABI remains reduced at 0.5 and she has lifestyle limiting claudication of left leg. She also has poorly controlled hypertension with typical blood pressures in the 160s and 170s. During her exam today her blood pressure was over 200 during the ABIs. She had a known renal artery stenosis seen on her previous angiogram.   Past Medical History:  Diagnosis Date  . 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 Hypertension   . Sciatica   . Sciatica     Past Surgical History:  Procedure Laterality Date  . ABDOMINAL HYSTERECTOMY    . CHOLECYSTECTOMY    . ENDARTERECTOMY FEMORAL Right 04/19/2017   Procedure: ENDARTERECTOMY COMMON FEMORAL ARTERY, PROFUNDA  FEMORIS ARTERY, and  SUPERFICIAL FEMORAL ARTERY;  Surgeon: Algernon Huxley, MD;  Location: [facility];  Service: Vascular;  Laterality: Right;  . HIP SURGERY Bilateral   . LOWER EXTREMITY ANGIOGRAM Right 04/19/2017   Procedure: LOWER EXTREMITY ANGIOGRAM WITH SUPERFICIAL FEMORAL ARTERY STENT PLACEMENT;  Surgeon: Algernon Huxley, MD;  Location: [facility];  Service: Vascular;  Laterality: Right;  . LOWER EXTREMITY ANGIOGRAPHY Right 04/18/2017   Procedure: Lower Extremity Angiography;  Surgeon: Algernon Huxley, MD;  Location: [facility];  Service: Cardiovascular;  Laterality: Right;  . SHOULDER SURGERY  Bilateral   . TOTAL HIP ARTHROPLASTY Bilateral       Allergies  Allergen Reactions  . Percocet [Oxycodone-Acetaminophen] Itching  . Percodan [Oxycodone-Aspirin] Itching    Current Outpatient Prescriptions  Medication Sig Dispense Refill  . aspirin EC 81 MG tablet Take 81 mg by mouth daily.    Marland Kitchen atorvastatin (LIPITOR) 20 MG tablet Take 1 tablet (20 mg total) by mouth daily at 6 PM. 30 tablet 3  . Cholecalciferol (VITAMIN D3 PO) Take 1 tablet by mouth daily.    . cloNIDine (CATAPRES) 0.1 MG tablet Take 1 tablet (0.1 mg total) by mouth 2 (two) times daily. (Patient taking differently: Take 0.1-0.2 mg by mouth 2 (two) times daily. 0.1 mg in the morning & 0.2 mg in the evening) 60 tablet 0  . clopidogrel (PLAVIX) 75 MG tablet Take 1 tablet (75 mg total) by mouth daily. 30 tablet 3  . Cyanocobalamin (VITAMIN B-12 PO) Take 1 tablet by mouth daily.    . hydrochlorothiazide (HYDRODIURIL) 25 MG tablet Take 25 mg by mouth daily.     . indomethacin (INDOCIN) 50 MG capsule Take 50 mg by mouth 2 (two) times daily as needed (for gout flares).    Marland Kitchen ketorolac (ACULAR) 0.4 % SOLN Place 1 drop into the left eye 4 (four) times daily.    . Multiple Minerals-Vitamins (LEG-GESIC PO) Take 1 tablet by mouth at bedtime. LEGATRIN PM    . naproxen sodium (ANAPROX) 220 MG tablet Take 440 mg by mouth daily.    . traMADol (ULTRAM) 50 MG tablet  Take 1 tablet (50 mg total) by mouth every 6 (six) hours as needed. 30 tablet 1  . tretinoin (RETIN-A) 0.05 % cream Apply 1 application topically every other day. EVERY OTHER NIGHT    . atorvastatin (LIPITOR) 20 MG tablet Take 1 tablet (20 mg total) by mouth daily. (Patient not taking: Reported on 06/15/2017) 30 tablet 3   No current facility-administered medications for this visit.         Physical Exam BP (!) 164/68 (BP Location: Left Arm)   Pulse (!) 56   Resp 19   Ht 5' 2.5" (1.588 m)   Wt 98 lb (44.5 kg)   BMI 17.64 kg/m  Gen:  WD/WN, NAD Skin: incision  C/D/I Heart: RRR, normal S1 and S2 Lungs: CTAB    Assessment/Plan:  Hyperlipidemia lipid control important in reducing the progression of atherosclerotic disease. Continue statin therapy   Atherosclerosis with claudication of extremity ([place]) Her right lower extremity is markedly improved after intervention. She is still bothered by lifestyle limiting claudication on the left leg. We will plan a left lower extremity angiogram in a few weeks after her right groin incision is completely healed. In the interim we will try to address her renal artery stenosis to improve her renovascular hypertension.  Renal artery stenosis ([facility]) The patient has renal artery stenosis with poorly controlled hypertension on multiple medications. I think at this point, intervention would be reasonable to try to improve her renal artery perfusion and improve her blood pressure control and save any renal function. Risks and benefits were discussed. We will do this before her left lower extremity revascularization as we can use the left groin.      Leotis Pain 06/15/2017, 11:59 AM   This note was created with Dragon medical transcription system.  Any errors from dictation are unintentional.

## 2017-06-19 ENCOUNTER — Encounter
Admission: RE | Admit: 2017-06-19 | Discharge: 2017-06-19 | Disposition: A | Discharge status: Home / Self Care | Payer: Medicare Other | Source: Ambulatory Visit | Attending: Vascular Surgery | Admitting: Vascular Surgery

## 2017-06-19 DIAGNOSIS — I150 Renovascular hypertension: Secondary | ICD-10-CM | POA: Diagnosis present

## 2017-06-19 DIAGNOSIS — Z9049 Acquired absence of other specified parts of digestive tract: Secondary | ICD-10-CM | POA: Diagnosis not present

## 2017-06-19 DIAGNOSIS — Z7982 Long term (current) use of aspirin: Secondary | ICD-10-CM | POA: Diagnosis not present

## 2017-06-19 DIAGNOSIS — I701 Atherosclerosis of renal artery: Secondary | ICD-10-CM | POA: Diagnosis not present

## 2017-06-19 DIAGNOSIS — Z955 Presence of coronary angioplasty implant and graft: Secondary | ICD-10-CM | POA: Diagnosis not present

## 2017-06-19 DIAGNOSIS — Z885 Allergy status to narcotic agent status: Secondary | ICD-10-CM | POA: Diagnosis not present

## 2017-06-19 DIAGNOSIS — Z7902 Long term (current) use of antithrombotics/antiplatelets: Secondary | ICD-10-CM | POA: Diagnosis not present

## 2017-06-19 DIAGNOSIS — Z96643 Presence of artificial hip joint, bilateral: Secondary | ICD-10-CM | POA: Diagnosis not present

## 2017-06-19 DIAGNOSIS — Z90710 Acquired absence of both cervix and uterus: Secondary | ICD-10-CM | POA: Diagnosis not present

## 2017-06-19 DIAGNOSIS — Z9889 Other specified postprocedural states: Secondary | ICD-10-CM | POA: Diagnosis not present

## 2017-06-19 DIAGNOSIS — I10 Essential (primary) hypertension: Secondary | ICD-10-CM | POA: Diagnosis not present

## 2017-06-19 DIAGNOSIS — I70212 Atherosclerosis of native arteries of extremities with intermittent claudication, left leg: Secondary | ICD-10-CM | POA: Diagnosis not present

## 2017-06-19 HISTORY — DX: Malignant (primary) neoplasm, unspecified: C80.1

## 2017-06-19 LAB — POTASSIUM: Potassium: 4 mmol/L (ref 3.5–5.1)

## 2017-06-19 NOTE — Pre-Procedure Instructions (Addendum)
Dawn [name]  AT DR DEW'S NOTIFED SURGERY FOR 06/21/17 LISTED AS RENAL ANGIOGRAPHY NOT RENAL STENT. STATES SHE WILL GET CORRECTED. PATIENT PREOPED ONLY AS ANGIOGRAM PATIENT AND IF NEEDS FURTHER TEST WILL NEED TO BE DONE AM SURGERY PATIENT CONTACTED AND SAID GIVEN INSTRUCTIONS BY SPECIALS AND DID NOT STOP ASA AND PLAVIX LAST TIME. KNOWS ARRIVAL TIME

## 2017-06-19 NOTE — Pre-Procedure Instructions (Signed)
Pt has instructions for procedure and arrival time

## 2017-06-20 MED ORDER — CEFAZOLIN SODIUM-DEXTROSE 1-4 GM/50ML-% IV SOLN: 1.0000 g | Freq: Once | INTRAVENOUS | Status: DC

## 2017-06-21 ENCOUNTER — Encounter
Admission: RE | Disposition: A | Discharge status: Home / Self Care | Payer: Self-pay | Source: Ambulatory Visit | Attending: Vascular Surgery

## 2017-06-21 ENCOUNTER — Ambulatory Visit
Admission: RE | Admit: 2017-06-21 | Discharge: 2017-06-21 | Disposition: A | Discharge status: Home / Self Care | Payer: Medicare Other | Source: Ambulatory Visit | Attending: Vascular Surgery | Admitting: Vascular Surgery

## 2017-06-21 ENCOUNTER — Encounter: Payer: Self-pay | Admitting: *Deleted

## 2017-06-21 DIAGNOSIS — Z885 Allergy status to narcotic agent status: Secondary | ICD-10-CM | POA: Insufficient documentation

## 2017-06-21 DIAGNOSIS — Z9889 Other specified postprocedural states: Secondary | ICD-10-CM | POA: Insufficient documentation

## 2017-06-21 DIAGNOSIS — I10 Essential (primary) hypertension: Secondary | ICD-10-CM | POA: Insufficient documentation

## 2017-06-21 DIAGNOSIS — I701 Atherosclerosis of renal artery: Secondary | ICD-10-CM | POA: Diagnosis not present

## 2017-06-21 DIAGNOSIS — Z7982 Long term (current) use of aspirin: Secondary | ICD-10-CM | POA: Insufficient documentation

## 2017-06-21 DIAGNOSIS — Z90710 Acquired absence of both cervix and uterus: Secondary | ICD-10-CM | POA: Insufficient documentation

## 2017-06-21 DIAGNOSIS — Z955 Presence of coronary angioplasty implant and graft: Secondary | ICD-10-CM | POA: Insufficient documentation

## 2017-06-21 DIAGNOSIS — I150 Renovascular hypertension: Secondary | ICD-10-CM | POA: Insufficient documentation

## 2017-06-21 DIAGNOSIS — Z96643 Presence of artificial hip joint, bilateral: Secondary | ICD-10-CM | POA: Insufficient documentation

## 2017-06-21 DIAGNOSIS — I70212 Atherosclerosis of native arteries of extremities with intermittent claudication, left leg: Secondary | ICD-10-CM | POA: Insufficient documentation

## 2017-06-21 DIAGNOSIS — Z7902 Long term (current) use of antithrombotics/antiplatelets: Secondary | ICD-10-CM | POA: Insufficient documentation

## 2017-06-21 DIAGNOSIS — Z9049 Acquired absence of other specified parts of digestive tract: Secondary | ICD-10-CM | POA: Insufficient documentation

## 2017-06-21 HISTORY — PX: RENAL ANGIOGRAPHY: CATH118260

## 2017-06-21 SURGERY — RENAL ANGIOGRAPHY
Anesthesia: Moderate Sedation

## 2017-06-21 MED ORDER — LIDOCAINE-EPINEPHRINE (PF) 2 %-1:200000 IJ SOLN
INTRAMUSCULAR | Status: AC
  Filled 2017-06-21: qty 20

## 2017-06-21 MED ORDER — GUAIFENESIN-DM 100-10 MG/5ML PO SYRP: 15.0000 mL | ORAL_SOLUTION | ORAL | Status: DC | PRN

## 2017-06-21 MED ORDER — PHENOL 1.4 % MT LIQD: 1.0000 | OROMUCOSAL | Status: DC | PRN

## 2017-06-21 MED ORDER — CLINDAMYCIN PHOSPHATE 300 MG/50ML IV SOLN: 300.0000 mg | Freq: Once | INTRAVENOUS | Status: DC

## 2017-06-21 MED ORDER — HYDRALAZINE HCL 20 MG/ML IJ SOLN: 5.0000 mg | INTRAMUSCULAR | Status: DC | PRN

## 2017-06-21 MED ORDER — MIDAZOLAM HCL 5 MG/5ML IJ SOLN
INTRAMUSCULAR | Status: AC
  Filled 2017-06-21: qty 5

## 2017-06-21 MED ORDER — ACETAMINOPHEN 325 MG RE SUPP
325.0000 mg | RECTAL | Status: DC | PRN
  Filled 2017-06-21: qty 2

## 2017-06-21 MED ORDER — ONDANSETRON HCL 4 MG/2ML IJ SOLN: 4.0000 mg | Freq: Four times a day (QID) | INTRAMUSCULAR | Status: DC | PRN

## 2017-06-21 MED ORDER — HEPARIN SODIUM (PORCINE) 1000 UNIT/ML IJ SOLN
INTRAMUSCULAR | Status: DC | PRN
  Administered 2017-06-21: 4000 [IU] via INTRAVENOUS

## 2017-06-21 MED ORDER — ACETAMINOPHEN 325 MG PO TABS: 325.0000 mg | ORAL_TABLET | ORAL | Status: DC | PRN

## 2017-06-21 MED ORDER — HYDROMORPHONE HCL 1 MG/ML IJ SOLN: 0.5000 mg | INTRAMUSCULAR | Status: DC | PRN

## 2017-06-21 MED ORDER — IOPAMIDOL (ISOVUE-300) INJECTION 61%
INTRAVENOUS | Status: DC | PRN
  Administered 2017-06-21: 40 mL via INTRA_ARTERIAL

## 2017-06-21 MED ORDER — FENTANYL CITRATE (PF) 100 MCG/2ML IJ SOLN
INTRAMUSCULAR | Status: AC
  Filled 2017-06-21: qty 2

## 2017-06-21 MED ORDER — HEPARIN SODIUM (PORCINE) 1000 UNIT/ML IJ SOLN
INTRAMUSCULAR | Status: AC
  Filled 2017-06-21: qty 1

## 2017-06-21 MED ORDER — SODIUM CHLORIDE 0.9 % IV SOLN
INTRAVENOUS | Status: DC
  Administered 2017-06-21: 10:00:00 via INTRAVENOUS

## 2017-06-21 MED ORDER — OXYCODONE-ACETAMINOPHEN 5-325 MG PO TABS: 1.0000 | ORAL_TABLET | ORAL | Status: DC | PRN

## 2017-06-21 MED ORDER — METOPROLOL TARTRATE 5 MG/5ML IV SOLN: 2.0000 mg | INTRAVENOUS | Status: DC | PRN

## 2017-06-21 MED ORDER — LABETALOL HCL 5 MG/ML IV SOLN: 10.0000 mg | INTRAVENOUS | Status: DC | PRN

## 2017-06-21 MED ORDER — HEPARIN (PORCINE) IN NACL 2-0.9 UNIT/ML-% IJ SOLN
INTRAMUSCULAR | Status: AC
  Filled 2017-06-21: qty 1000

## 2017-06-21 MED ORDER — SODIUM CHLORIDE 0.9 % IV SOLN: 500.0000 mL | Freq: Once | INTRAVENOUS | Status: DC | PRN

## 2017-06-21 MED ORDER — SODIUM CHLORIDE 0.9 % IV BOLUS (SEPSIS)
250.0000 mL | Freq: Once | INTRAVENOUS | Status: AC
  Administered 2017-06-21: 250 mL via INTRAVENOUS

## 2017-06-21 MED ORDER — MIDAZOLAM HCL 2 MG/2ML IJ SOLN
INTRAMUSCULAR | Status: DC | PRN
  Administered 2017-06-21: 1 mg via INTRAVENOUS
  Administered 2017-06-21: 2 mg via INTRAVENOUS
  Administered 2017-06-21: 1 mg via INTRAVENOUS

## 2017-06-21 MED ORDER — FENTANYL CITRATE (PF) 100 MCG/2ML IJ SOLN
INTRAMUSCULAR | Status: DC | PRN
  Administered 2017-06-21 (×2): 25 ug via INTRAVENOUS
  Administered 2017-06-21: 50 ug via INTRAVENOUS

## 2017-06-21 SURGICAL SUPPLY — 19 items
BALLN VIATRAC 4X20X135 (BALLOONS) ×3
BALLOON VIATRAC 4X20X135 (BALLOONS) ×1 IMPLANT
CATH 5FR REUT (CATHETERS) ×3 IMPLANT
CATH LIMA 6F (CATHETERS) ×3 IMPLANT
CATH PIG 70CM (CATHETERS) ×3 IMPLANT
DEVICE PRESTO INFLATION (MISCELLANEOUS) ×3 IMPLANT
DEVICE STARCLOSE SE CLOSURE (Vascular Products) ×3 IMPLANT
DEVICE TORQUE (MISCELLANEOUS) ×3 IMPLANT
GLIDECATH 4FR STR (CATHETERS) ×3 IMPLANT
GLIDEWIRE STIFF .35X180X3 HYDR (WIRE) ×3 IMPLANT
PACK ANGIOGRAPHY (CUSTOM PROCEDURE TRAY) ×3 IMPLANT
SHEATH ANL2 6FRX45 HC (SHEATH) ×3 IMPLANT
SHEATH BRITE TIP 5FRX11 (SHEATH) ×3 IMPLANT
SHEATH BRITE TIP 6FRX11 (SHEATH) ×3 IMPLANT
STENT HERCULINK RX 5.0X15X135 (Permanent Stent) ×3 IMPLANT
SYR MEDRAD MARK V 150ML (SYRINGE) ×3 IMPLANT
TUBING CONTRAST HIGH PRESS 72 (TUBING) ×3 IMPLANT
VALVE HEMO TOUHY BORST Y (VALVE) ×3 IMPLANT
WIRE J 3MM .035X145CM (WIRE) ×3 IMPLANT

## 2017-06-21 NOTE — Op Note (Signed)
[facility] Percutaneous Study/Intervention Procedural Note    Surgeon(s): [employer]  Assistants: None  Pre-operative Diagnosis: Bilateral renal artery stenosis, renovascular hypertension  Post-operative diagnosis: Same  Procedure(s) Performed: 1. Ultrasound guidance for vascular access left femoral artery 2. Catheter placement into left renal artery and right renal artery from right femoral approach 3. Aortogram and selective bilateral renal angiograms 4. Balloon expandable stent placement to right renal artery with a 5 mm diameter x 15 mm length stent Herculink balloon expandable 5. StarClose closure device left femoral artery  Contrast: 40 cc  EBL: Minimal   Fluoro Time: 9 minutes  Moderate conscious sedation: Approximately 40 minutes with 4 mg of Versed and 100 mcg of Fentanyl  Indications: The patient is a renal artery stenosis with worsening severe hypertension despite multiple medications. The patient has suboptimal blood pressure control despite multiple antihypertensives and a previous aortogram suggesting renal artery stenosis. Given the clinical scenario and the noninvasive findings, angiogram is indicated for further evaluation of her renal artery and potential treatment. Risks and benefits are discussed and informed consent is obtained.  Procedure: The patient was identified and appropriate procedural time out was performed. The patient was then placed supine on the table and prepped and draped in the usual sterile fashion.Moderate conscious sedation was administered with a face to face encounter with the patient throughout the procedure with my supervision of the RN administering medicines and monitoring the patients vital signs and mental status throughout from the start of the procedure until the patient was taken to the recovery room  Ultrasound was used to  evaluate the left common femoral artery. It was patent . A digital ultrasound image was acquired. A Seldinger needle was used to access the right common femoral artery under direct ultrasound guidance and a permanent image was performed. A 0.035 J wire was advanced without resistance and a 5Fr sheath was placed. Pigtail catheter was placed into the aorta at the L1 level and an AP aortogram was performed. This demonstrated extensively calcified aorta and iliac arteries without obvious stenosis although there was a shelf of plaque in the mid aorta creating at least a mild stenosis. Both renal arteries were seen and appeared to be diseased although the right was not well seen on the original aortogram. The left renal artery stenosis was 1-2 cm beyond its origin and extended out to the bifurcation of the main renal artery. The patient was then systemically heparinized with 4000 units of intravenous heparin and 12-1/2 g of mannitol were given. I used a VS 1 catheter to cannulate the left renal artery and selective imaging was performed. This confirmed a greater than 80% stenosis in the mid and distal left renal artery as described above.  At this point, endovascular options for treatment of this artery were somewhat limited secondary to the location. I elected to get a better look at the right renal artery to see if this would be more well treated with conventional endovascular therapy. We came back into the aorta and a VS 1 catheter was used to selectively cannulate the right renal artery without difficulty. This did demonstrate a high-grade stenosis of greater than 75% of the origin of the right renal artery disease tracked about a centimeter into the artery. There was a better nephrogram in the right kidney as well. I then used a 0.014 wire to cross the lesion without difficulty and telescope the 6 [place] Ansell sheath to the origin of the right renal artery. This was  predilated with a 4 mm diameter by 2 cm  length angioplasty balloon after removal of the diagnostic catheter as the stent would not originally crossed. I then selected a 5 mm diameter x 15 mm length balloon expandable stent and brought this across the lesion.  This was deployed encompassing the lesion with its proximal extent going back into the aorta for a mm or two.  This was inflated to 14 ATM and the waist resolved.  Completion angiogram showed brisk flow through the stent with less than 20% residual stenosis and a good nephrogram. Oblique arteriogram was performed of the left femoral artery and StarClose closure device was deployed in the usual fashion with excellent hemostatic result. The patient was taken to the recovery room in stable condition having tolerated the procedure well.  Findings:  Aortogram/Renal Arteries:This demonstrated extensively calcified aorta and iliac arteries without obvious stenosis although there was a shelf of plaque in the mid aorta creating at least a mild stenosis. Both renal arteries were seen and appeared to be diseased although the right was not well seen on the original aortogram. The left renal artery stenosis was 1-2 cm beyond its origin and extended out to the bifurcation of the main renal artery. On further selective imaging the right renal artery stenosis at the origin was greater than 75% and highly calcific. With these anatomic findings, we selected to treat the right renal artery today   Condition:  Stable  Complications: None   Dawn [name] 06/21/2017 11:20 AM  This note was created with Dragon Medical transcription system. Any errors in dictation are purely unintentional.

## 2017-06-21 NOTE — H&P (Signed)
[facility] History & Physical Update  The patient was interviewed and re-examined.  The patient's previous History and Physical has been reviewed and is unchanged.  There is no change in the plan of care. We plan to proceed with the scheduled procedure.  Leotis Pain, MD  06/21/2017, 8:15 AM

## 2017-06-22 ENCOUNTER — Encounter: Payer: Self-pay | Admitting: Vascular Surgery

## 2017-07-04 ENCOUNTER — Other Ambulatory Visit (INDEPENDENT_AMBULATORY_CARE_PROVIDER_SITE_OTHER): Payer: Self-pay

## 2017-07-11 MED ORDER — CEFAZOLIN SODIUM-DEXTROSE 1-4 GM/50ML-% IV SOLN
1.0000 g | Freq: Once | INTRAVENOUS | Status: AC
  Administered 2017-07-12: 1 g via INTRAVENOUS

## 2017-07-12 ENCOUNTER — Encounter
Admission: RE | Disposition: A | Discharge status: Home / Self Care | Payer: Self-pay | Source: Ambulatory Visit | Attending: Vascular Surgery

## 2017-07-12 ENCOUNTER — Ambulatory Visit
Admission: RE | Admit: 2017-07-12 | Discharge: 2017-07-12 | Disposition: A | Discharge status: Home / Self Care | Payer: Medicare Other | Source: Ambulatory Visit | Attending: Vascular Surgery | Admitting: Vascular Surgery

## 2017-07-12 DIAGNOSIS — I70212 Atherosclerosis of native arteries of extremities with intermittent claudication, left leg: Secondary | ICD-10-CM | POA: Insufficient documentation

## 2017-07-12 DIAGNOSIS — Z885 Allergy status to narcotic agent status: Secondary | ICD-10-CM | POA: Diagnosis not present

## 2017-07-12 DIAGNOSIS — Z9049 Acquired absence of other specified parts of digestive tract: Secondary | ICD-10-CM | POA: Diagnosis not present

## 2017-07-12 DIAGNOSIS — Z9889 Other specified postprocedural states: Secondary | ICD-10-CM | POA: Insufficient documentation

## 2017-07-12 DIAGNOSIS — Z96643 Presence of artificial hip joint, bilateral: Secondary | ICD-10-CM | POA: Diagnosis not present

## 2017-07-12 DIAGNOSIS — Z7982 Long term (current) use of aspirin: Secondary | ICD-10-CM | POA: Insufficient documentation

## 2017-07-12 DIAGNOSIS — E785 Hyperlipidemia, unspecified: Secondary | ICD-10-CM | POA: Insufficient documentation

## 2017-07-12 DIAGNOSIS — I701 Atherosclerosis of renal artery: Secondary | ICD-10-CM | POA: Diagnosis not present

## 2017-07-12 DIAGNOSIS — Z90710 Acquired absence of both cervix and uterus: Secondary | ICD-10-CM | POA: Insufficient documentation

## 2017-07-12 DIAGNOSIS — Z7902 Long term (current) use of antithrombotics/antiplatelets: Secondary | ICD-10-CM | POA: Diagnosis not present

## 2017-07-12 HISTORY — PX: LOWER EXTREMITY ANGIOGRAPHY: CATH118251

## 2017-07-12 HISTORY — PX: LOWER EXTREMITY INTERVENTION: CATH118252

## 2017-07-12 LAB — BUN: BUN: 31 mg/dL — ABNORMAL HIGH (ref 6–20)

## 2017-07-12 LAB — CREATININE, SERUM
CREATININE: 1.3 mg/dL — AB (ref 0.44–1.00)
GFR calc Af Amer: 42 mL/min — ABNORMAL LOW (ref >60)
GFR, EST NON AFRICAN AMERICAN: 36 mL/min — AB (ref >60)

## 2017-07-12 SURGERY — LOWER EXTREMITY ANGIOGRAPHY
Anesthesia: Moderate Sedation | Laterality: Left

## 2017-07-12 MED ORDER — SODIUM CHLORIDE 0.9 % WEIGHT BASED INFUSION: 1.0000 mL/kg/h | INTRAVENOUS | Status: DC

## 2017-07-12 MED ORDER — LABETALOL HCL 5 MG/ML IV SOLN: 10.0000 mg | INTRAVENOUS | Status: DC | PRN

## 2017-07-12 MED ORDER — MIDAZOLAM HCL 2 MG/2ML IJ SOLN
INTRAMUSCULAR | Status: DC | PRN
  Administered 2017-07-12: 2 mg via INTRAVENOUS
  Administered 2017-07-12 (×3): 1 mg via INTRAVENOUS

## 2017-07-12 MED ORDER — SODIUM CHLORIDE 0.9% FLUSH: 3.0000 mL | Freq: Two times a day (BID) | INTRAVENOUS | Status: DC

## 2017-07-12 MED ORDER — IOPAMIDOL (ISOVUE-300) INJECTION 61%
INTRAVENOUS | Status: DC | PRN
  Administered 2017-07-12: 75 mL via INTRA_ARTERIAL

## 2017-07-12 MED ORDER — LABETALOL HCL 5 MG/ML IV SOLN
INTRAVENOUS | Status: DC | PRN
Start: 2017-07-12 — End: 2017-07-12
  Administered 2017-07-12: 20 mg via INTRAVENOUS

## 2017-07-12 MED ORDER — SODIUM CHLORIDE 0.9% FLUSH: 3.0000 mL | INTRAVENOUS | Status: DC | PRN

## 2017-07-12 MED ORDER — SODIUM CHLORIDE 0.9 % IV SOLN: 250.0000 mL | INTRAVENOUS | Status: DC | PRN

## 2017-07-12 MED ORDER — FENTANYL CITRATE (PF) 100 MCG/2ML IJ SOLN
INTRAMUSCULAR | Status: DC | PRN
  Administered 2017-07-12 (×2): 50 ug via INTRAVENOUS

## 2017-07-12 MED ORDER — HYDRALAZINE HCL 20 MG/ML IJ SOLN: 5.0000 mg | INTRAMUSCULAR | Status: DC | PRN

## 2017-07-12 MED ORDER — SODIUM CHLORIDE 0.9 % IV SOLN
INTRAVENOUS | Status: DC
  Administered 2017-07-12: 07:00:00 via INTRAVENOUS

## 2017-07-12 MED ORDER — HEPARIN SODIUM (PORCINE) 1000 UNIT/ML IJ SOLN
INTRAMUSCULAR | Status: DC | PRN
  Administered 2017-07-12: 4000 [IU] via INTRAVENOUS

## 2017-07-12 SURGICAL SUPPLY — 29 items
BALLN DORADO 5X200X135 (BALLOONS) ×4
BALLN LUTONIX 4X150X130 (BALLOONS) ×4
BALLN LUTONIX 5X150X130 (BALLOONS) ×4
BALLN ULTRVRSE 2.5X300X150 (BALLOONS) ×4
BALLN ULTRVRSE 2X100X150 (BALLOONS) ×4
BALLN ULTRVRSE 4X300X150 (BALLOONS) ×4
BALLOON DORADO 5X200X135 (BALLOONS) ×2 IMPLANT
BALLOON LUTONIX 4X150X130 (BALLOONS) ×2 IMPLANT
BALLOON LUTONIX 5X150X130 (BALLOONS) ×2 IMPLANT
BALLOON ULTRVRSE 2.5X300X150 (BALLOONS) ×2 IMPLANT
BALLOON ULTRVRSE 2X100X150 (BALLOONS) ×2 IMPLANT
BALLOON ULTRVRSE 4X300X150 (BALLOONS) ×2 IMPLANT
CATH BEACON 5 .038 100 VERT TP (CATHETERS) ×4 IMPLANT
CATH CXI 4F 90 DAV (CATHETERS) ×4 IMPLANT
CATH CXI SUPP ANG 4FR 135 (MICROCATHETER) ×2 IMPLANT
CATH CXI SUPP ANG 4FR 135CM (MICROCATHETER) ×4
CATH CXI SUPP ST 2.6FR 150CM (CATHETERS) ×4 IMPLANT
CATH IMAGER II S 5FR 65CM (MISCELLANEOUS) ×4 IMPLANT
DEVICE PRESTO INFLATION (MISCELLANEOUS) ×4 IMPLANT
DEVICE STARCLOSE SE CLOSURE (Vascular Products) ×4 IMPLANT
GLIDEWIRE ADV .035X180CM (WIRE) ×8 IMPLANT
PACK ANGIOGRAPHY (CUSTOM PROCEDURE TRAY) ×4 IMPLANT
SHEATH ANL2 6FRX45 HC (SHEATH) ×4 IMPLANT
SHEATH BRITE TIP 5FRX11 (SHEATH) ×4 IMPLANT
STENT VIABAHN 5X250X120 (Permanent Stent) ×4 IMPLANT
STENT VIABAHN 6X150X120 (Permanent Stent) ×4 IMPLANT
TUBING CONTRAST HIGH PRESS 72 (TUBING) ×4 IMPLANT
WIRE G V18X300CM (WIRE) ×8 IMPLANT
WIRE J 3MM .035X145CM (WIRE) ×8 IMPLANT

## 2017-07-12 NOTE — Progress Notes (Signed)
@   1320 no visible bleeding at this time. Ready for discharge.son present,

## 2017-07-12 NOTE — Discharge Instructions (Signed)
Groin Insertion Instructions-If you lose feeling or develop tingling or pain in your leg or foot after the procedure, please walk around first.  If the discomfort does not improve , contact your physician and proceed to the nearest emergency room.  Loss of feeling in your leg might mean that a blockage has formed in the artery and this can be appropriately treated.  Limit your activity for the next two days after your procedure.  Avoid stooping, bending, heavy lifting or exertion as this may put pressure on the insertion site.  Resume normal activities in 48 hours.  You may shower after 24 hours but avoid excessive warm water and do not scrub the site.  Remove clear dressing in 48 hours.  If you have had a closure device inserted, do not soak in a tub bath or a hot tub for at least one week. ° °No driving for 48 hours after discharge.  After the procedure, check the insertion site occasionally.  If any oozing occurs or there is apparent swelling, firm pressure over the site will prevent a bruise from forming.  You can not hurt anything by pressing directly on the site.  The pressure stops the bleeding by allowing a small clot to form.  If the bleeding continues after the pressure has been applied for more than 15 minutes, call 911 or go to the nearest emergency room.   ° °The x-ray dye causes you to pass a considerate amount of urine.  For this reason, you will be asked to drink plenty of liquids after the procedure to prevent dehydration.  You may resume you regular diet.  Avoid caffeine products.   ° °For pain at the site of your procedure, take non-aspirin medicines such as Tylenol. ° °Medications: A. Hold Metformin for 48 hours if applicable.  B. Continue taking all your present medications at home unless your doctor prescribes any changes. ° °Moderate Conscious Sedation, Adult, Care After °These instructions provide you with information about caring for yourself after your procedure. Your health care provider  may also give you more specific instructions. Your treatment has been planned according to current medical practices, but problems sometimes occur. Call your health care provider if you have any problems or questions after your procedure. °What can I expect after the procedure? °After your procedure, it is common: °· To feel sleepy for several hours. °· To feel clumsy and have poor balance for several hours. °· To have poor judgment for several hours. °· To vomit if you eat too soon. ° °Follow these instructions at home: °For at least 24 hours after the procedure: ° °· Do not: °? Participate in activities where you could fall or become injured. °? Drive. °? Use heavy machinery. °? Drink alcohol. °? Take sleeping pills or medicines that cause drowsiness. °? Make important decisions or sign legal documents. °? Take care of children on your own. °· Rest. °Eating and drinking °· Follow the diet recommended by your health care provider. °· If you vomit: °? Drink water, juice, or soup when you can drink without vomiting. °? Make sure you have little or no nausea before eating solid foods. °General instructions °· Have a responsible adult stay with you until you are awake and alert. °· Take over-the-counter and prescription medicines only as told by your health care provider. °· If you smoke, do not smoke without supervision. °· Keep all follow-up visits as told by your health care provider. This is important. °Contact a health care provider   if: °· You keep feeling nauseous or you keep vomiting. °· You feel light-headed. °· You develop a rash. °· You have a fever. °Get help right away if: °· You have trouble breathing. °This information is not intended to replace advice given to you by your health care provider. Make sure you discuss any questions you have with your health care provider. °Document Released: 09/03/2013 Document Revised: 04/17/2016 Document Reviewed: 03/04/2016 °Elsevier Interactive Patient Education © 2018  [employer] ° °

## 2017-07-12 NOTE — Progress Notes (Signed)
@   1300 patient head of bed back up,no further bleeding nor hematoma visible at site. Denies complaints.

## 2017-07-12 NOTE — Progress Notes (Signed)
Patient remains clinically stable post angiogram with intervention,son at bedside. No bleeding nor hematoma at right groin site. Pulses palpable bil. Feet. Sinus rhythm per monitor. Dr Lucky Cowboy out to speak with patient with questions answered,discharge instructions given with return appointment, questions answered. Taking po's without difficutly. Denies complaints at this time.

## 2017-07-12 NOTE — Progress Notes (Signed)
As getting patient ready for discharge, prior to putting jeans on, noting bleeding at right groin, layed flat and held pressure for 53min's, vitals stable,son back to bedside. Will keep for another hour and reassess. Dr Lucky Cowboy made aware.

## 2017-07-12 NOTE — H&P (Signed)
[facility] History & Physical Update  The patient was interviewed and re-examined.  The patient's previous History and Physical has been reviewed and is unchanged.  There is no change in the plan of care. We plan to proceed with the scheduled procedure which is a left leg angiogram today.  Leotis Pain, MD  07/12/2017, 8:08 AM

## 2017-07-12 NOTE — Op Note (Signed)
[facility] Percutaneous Study/Intervention Procedural Note   Date of Surgery: 07/12/2017  Surgeon(s):Rogers Ditter   Assistants:none  Pre-operative Diagnosis: PAD with claudication left lower extremity  Post-operative diagnosis: Same  Procedure(s) Performed: 1. Ultrasound guidance for vascular access right femoral artery 2. Catheter placement into left anterior tibial artery from right femoral approach 3. Aortogram and selective left lower extremity angiogram 4. Percutaneous transluminal angioplasty of left anterior tibial artery with 2 mm diameter angioplasty balloon in the foot and ankle and 2.5 mm diameter angioplasty balloon throughout the remainder of the anterior tibial artery to its origin 5. percutaneous transluminal angioplasty of the left popliteal artery and SFA with 4 mm diameter Lutonix drug-coated angioplasty balloon in the popliteal artery and mid and distal SFA with the second inflation, and a 5 mm diameter by 15 cm length Lutonix drug-coated angioplasty balloon in the proximal SFA  6.  Viabahn covered stent placement 2 to the left SFA and popliteal artery with a 5 mm diameter by 25 cm length stent from the knee to the proximal to mid SFA and a 6 mm diameter by 15 cm length stent from 2-3 cm beyond the SFA origin down to the mid SFA 7. StarClose closure device right femoral artery  EBL: 10 cc  Contrast: 75 cc  Fluoro Time: 23.5 minutes  Moderate Conscious Sedation Time: approximately 75 minutes using 5 mg of Versed and 100 mcg of Fentanyl  Indications: Patient is a 83 y.o.female with lifestyle limiting claudication of the left leg. She has already undergone treatment of the right lower extremity with good results. The patient has noninvasive study showing markedly reduced ABI and a known left SFA occlusion from previous angiography. The patient is brought  in for angiography for further evaluation and potential treatment. Risks and benefits are discussed and informed consent is obtained  Procedure: The patient was identified and appropriate procedural time out was performed. The patient was then placed supine on the table and prepped and draped in the usual sterile fashion.Moderate conscious sedation was administered during a face to face encounter with the patient throughout the procedure with my supervision of the RN administering medicines and monitoring the patient's vital signs, pulse oximetry, telemetry and mental status throughout from the start of the procedure until the patient was taken to the recovery room. Ultrasound was used to evaluate the right common femoral artery. It was patent status post previous endarterectomy and patch angioplasty. A digital ultrasound image was acquired. A Seldinger needle was used to access the right common femoral artery under direct ultrasound guidance and a permanent image was performed. A 0.035 J wire was advanced without resistance and a 5Fr sheath was placed. Pigtail catheter was placed into the aorta and an AP aortogram was performed. This demonstrated previously placed right renal artery stent appeared widely patent. Known left renal artery stenosis persisted. Highly calcific and somewhat tortuous iliac arteries and aorta. Mild stenosis in the left external iliac artery which did not appear flow limiting. No significant right iliac artery stenosis. I then crossed the aortic bifurcation and advanced to the left femoral head. Selective left lower extremity angiogram was then performed. This demonstrated near flush occlusion of the left superficial femoral artery with a diseased and calcific profunda femoris artery providing the collaterals distally. She reconstituted Hunter's canal but was extremely calcific in the popliteal artery remaining disease in the above-knee popliteal artery. There was then a napkin  ring like lesion at the tibial trifurcation with a long segment occlusion  of the anterior tibial artery and the peroneal artery which had some mild stenosis proximally but was the only runoff initially seen. The posterior tibial artery was chronically occluded without distal reconstitution. The patient was systemically heparinized and a 6 [place] Ansell sheath was then placed over the [employer] wire. I then used a Kumpe catheter and the advantage wire to navigate into the SFA occlusion. This is a very difficult lesion to cross and was highly calcific. We were eventually able to cross this with a CXI catheter and the advantage wire and confirm intraluminal flow in the below-knee popliteal artery. I then exchanged for a 0.018 wire which navigated through the napkin ringlike near occlusive stenosis at the tibial trifurcation and down into the anterior tibial artery. This crossed into the foot reasonably easily and I was able to confirm intraluminal flow with a 0.018 CXI catheter at the ankle. I then proceeded with treatment. A 2 mm diameter by 10 cm length angioplasty balloon was used to treat the dorsalis pedis artery and the anterior tibial artery at the ankle. A 2.5 mm diameter by 30 cm length angioplasty balloon was then used to treat the remainder of the anterior tibial artery occlusion as well as the napkin ring like stenosis at the tibial trifurcation. Each of these inflations were about 12 atm for 1 minute. I used the 2-1/2 balloon to predilate the SFA and popliteal arteries due to the highly calcific occlusive lesion with stored energy. These inflations were 14 atm minute. I then perform definitive treatment on the SFA and popliteal arteries. A 4 mm diameter by 15 cm length Lutonix drug-coated angioplasty balloon was inflated from the knee up to Hunter's canal initially. This inflation was 8 atm for 1 minute. I then pulled this back and treated the mid and distal SFA with the second inflation up to 12  atm for 1 minute. A 5 mm diameter by 15 cm length Lutonix drug-coated angioplasty balloon was used to treat the proximal SFA and this was inflated to 8 atm for 1 minute up to the common femoral artery. Following these angioplasties there was high-grade residual stenosis with long flow-limiting dissections throughout the SFA and above-knee popliteal arteries. The anterior tibial artery however was widely patent with no greater than 30% residual stenosis and this was now the dominant runoff to the foot. I then used a 5 mm diameter by 25 cm length Viabahn stent deployed from just above the knee up to the proximal to mid superficial femoral artery. A 6 mm diameter by 15 cm length Viabahn stent was then deployed from about 2-3 cm from the origin of the SFA down into the first placed stent. We post dilated these with 4 mm balloons as well as a couple of areas which needed 5 mm diameter high pressure angioplasty balloons inflated up over 20 atm.Completion angiogram showed about a 20-25% residual stenosis at the most calcific lesion at Hunter's canal but there was now brisk flow distally. I felt we had improved her perfusion is much is possible. I elected to terminate the procedure. The sheath was removed and StarClose closure device was deployed in the right femoral artery with excellent hemostatic result. The patient was taken to the recovery room in stable condition having tolerated the procedure well.  Findings:  Aortogram: previously placed right renal artery stent appeared widely patent. Known left renal artery stenosis persisted. Highly calcific and somewhat tortuous iliac arteries and aorta. Mild stenosis in the left external iliac artery which did  not appear flow limiting. No significant right iliac artery stenosis. Left Lower Extremity: near flush occlusion of the left superficial femoral artery with a diseased and calcific profunda femoris artery providing the collaterals distally.  She reconstituted Hunter's canal but was extremely calcific in the popliteal artery remaining disease in the above-knee popliteal artery. There was then a napkin ring like lesion at the tibial trifurcation with a long segment occlusion of the anterior tibial artery and the peroneal artery which had some mild stenosis proximally but was the only runoff initially seen. The posterior tibial artery was chronically occluded without distal reconstitution.   Disposition: Patient was taken to the recovery room in stable condition having tolerated the procedure well.  Complications: None  Leotis Pain 07/12/2017 9:49 AM   This note was created with Dragon Medical transcription system. Any errors in dictation are purely unintentional.

## 2017-07-13 ENCOUNTER — Encounter: Payer: Self-pay | Admitting: Vascular Surgery

## 2017-08-03 ENCOUNTER — Encounter (INDEPENDENT_AMBULATORY_CARE_PROVIDER_SITE_OTHER): Payer: Medicare Other | Admitting: Vascular Surgery

## 2017-08-06 ENCOUNTER — Other Ambulatory Visit (INDEPENDENT_AMBULATORY_CARE_PROVIDER_SITE_OTHER): Payer: Self-pay | Admitting: Vascular Surgery

## 2017-08-06 DIAGNOSIS — Z95820 Peripheral vascular angioplasty status with implants and grafts: Secondary | ICD-10-CM

## 2017-08-06 DIAGNOSIS — I70219 Atherosclerosis of native arteries of extremities with intermittent claudication, unspecified extremity: Secondary | ICD-10-CM

## 2017-08-07 ENCOUNTER — Encounter (INDEPENDENT_AMBULATORY_CARE_PROVIDER_SITE_OTHER): Payer: Self-pay | Admitting: Vascular Surgery

## 2017-08-07 ENCOUNTER — Ambulatory Visit (INDEPENDENT_AMBULATORY_CARE_PROVIDER_SITE_OTHER): Payer: Medicare Other

## 2017-08-07 ENCOUNTER — Ambulatory Visit (INDEPENDENT_AMBULATORY_CARE_PROVIDER_SITE_OTHER): Payer: Medicare Other | Admitting: Vascular Surgery

## 2017-08-07 VITALS — BP 220/80 | HR 76 | Resp 14 | Ht 63.0 in | Wt 100.0 lb

## 2017-08-07 DIAGNOSIS — Z95820 Peripheral vascular angioplasty status with implants and grafts: Secondary | ICD-10-CM

## 2017-08-07 DIAGNOSIS — Z959 Presence of cardiac and vascular implant and graft, unspecified: Secondary | ICD-10-CM

## 2017-08-07 DIAGNOSIS — I701 Atherosclerosis of renal artery: Secondary | ICD-10-CM | POA: Diagnosis not present

## 2017-08-07 DIAGNOSIS — E785 Hyperlipidemia, unspecified: Secondary | ICD-10-CM

## 2017-08-07 DIAGNOSIS — I70219 Atherosclerosis of native arteries of extremities with intermittent claudication, unspecified extremity: Secondary | ICD-10-CM | POA: Diagnosis not present

## 2017-08-07 NOTE — Patient Instructions (Signed)

## 2017-08-07 NOTE — Assessment & Plan Note (Signed)
Status post unilateral renal artery revascularization, but did have bilateral disease. We will recheck her renal artery duplex in 3 months. If her blood pressure control remains suboptimal, angiography with consideration for intervention would still be planned.

## 2017-08-07 NOTE — Assessment & Plan Note (Signed)
Her noninvasive studies today show stable right ABI of 0.53 and a markedly improved left ABI of 0.98. Her symptoms are markedly improved and she denies any current lifestyle limiting claudication, ischemic rest pain, or ulceration. At this point, we will plan to recheck her perfusion in about 3 months. She will contact our office with any problems in the interim.

## 2017-08-07 NOTE — Progress Notes (Signed)
MRN : [id number]  Dawn [name] is a 83 y.o. (Jul 31, 1928) female who presents with chief complaint of  Chief Complaint  Patient presents with  . Routine Post Op    3-4 week f/u  .  History of Present Illness: Patient returns today in follow up of PAD. She has undergone left lower extremity revascularization. Her reperfusion swelling is moderate and she has some bruising on her leg, but she says her leg is pain-free and she has resumed all of her normal activities including dancing. Her noninvasive studies today show stable right ABI of 0.53 and a markedly improved left ABI of 0.98. She continues to have markedly elevated blood pressure, but says she thinks her blood pressure today was because she has already had her dance class this morning. She has undergone unilateral renal artery revascularization and does have bilateral disease.   Current Outpatient Prescriptions  Medication Sig Dispense Refill  . aspirin EC 81 MG tablet Take 81 mg by mouth daily.    Marland Kitchen atorvastatin (LIPITOR) 20 MG tablet Take 1 tablet (20 mg total) by mouth daily. (Patient taking differently: Take 20 mg by mouth daily at 6 PM. ) 30 tablet 3  . Cholecalciferol (VITAMIN D3) 400 units CAPS Take 400 Units by mouth daily.    . cloNIDine (CATAPRES) 0.1 MG tablet Take 1 tablet (0.1 mg total) by mouth 2 (two) times daily. (Patient taking differently: Take 0.1-0.2 mg by mouth 2 (two) times daily. 0.1 mg in the morning & 0.2 mg in the evening) 60 tablet 0  . clopidogrel (PLAVIX) 75 MG tablet Take 1 tablet (75 mg total) by mouth daily. 30 tablet 3  . Cyanocobalamin (B-12) 5000 MCG CAPS Take 5,000 mcg by mouth daily.     . hydrochlorothiazide (HYDRODIURIL) 25 MG tablet Take 25 mg by mouth daily.     . indomethacin (INDOCIN) 50 MG capsule Take 50 mg by mouth 2 (two) times daily as needed (for gout flares).    Marland Kitchen ketorolac (ACULAR) 0.4 % SOLN Place 1 drop into the left eye 4 (four) times daily.    . Multiple  Minerals-Vitamins (LEG-GESIC PO) Take 1 tablet by mouth at bedtime. LEGATRIN PM    . naproxen sodium (ANAPROX) 220 MG tablet Take 440 mg by mouth daily.    . traMADol (ULTRAM) 50 MG tablet Take 1 tablet (50 mg total) by mouth every 6 (six) hours as needed. (Patient taking differently: Take 50 mg by mouth daily. ) 30 tablet 1  . tretinoin (RETIN-A) 0.05 % cream Apply 1 application topically every other day. EVERY OTHER NIGHT     No current facility-administered medications for this visit.     Past Medical History:  Diagnosis Date  . Cancer ([place])    skin cancer  . 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 Hypertension   . Sciatica   . Sciatica     Past Surgical History:  Procedure Laterality Date  . ABDOMINAL HYSTERECTOMY    . APPENDECTOMY    . CATARACT EXTRACTION, BILATERAL    . CHOLECYSTECTOMY    . ENDARTERECTOMY FEMORAL Right 04/19/2017   Procedure: ENDARTERECTOMY COMMON FEMORAL ARTERY, PROFUNDA  FEMORIS ARTERY, and  SUPERFICIAL FEMORAL ARTERY;  Surgeon: Algernon Huxley, MD;  Location: [facility];  Service: Vascular;  Laterality: Right;  . HIP SURGERY Bilateral   . LOWER EXTREMITY ANGIOGRAM Right 04/19/2017   Procedure: LOWER EXTREMITY ANGIOGRAM WITH SUPERFICIAL FEMORAL ARTERY  STENT PLACEMENT;  Surgeon: Algernon Huxley, MD;  Location: [facility];  Service: Vascular;  Laterality: Right;  . LOWER EXTREMITY ANGIOGRAPHY Right 04/18/2017   Procedure: Lower Extremity Angiography;  Surgeon: Algernon Huxley, MD;  Location: [place] [facility];  Service: Cardiovascular;  Laterality: Right;  . LOWER EXTREMITY ANGIOGRAPHY Left 07/12/2017   Procedure: Lower Extremity Angiography;  Surgeon: Algernon Huxley, MD;  Location: [place] [facility];  Service: Cardiovascular;  Laterality: Left;  . LOWER EXTREMITY INTERVENTION  07/12/2017   Procedure: Lower Extremity Intervention;  Surgeon: Algernon Huxley, MD;  Location: [facility] [facility];  Service: Cardiovascular;;  .  RENAL ANGIOGRAPHY N/A 06/21/2017   Procedure: Renal Angiography;  Surgeon: Algernon Huxley, MD;  Location: [place] [facility];  Service: Cardiovascular;  Laterality: N/A;  . SHOULDER SURGERY Bilateral   . TOTAL HIP ARTHROPLASTY Bilateral     Social History Social History  Substance Use Topics  . Smoking status: Former Smoker    Types: Cigarettes  . Smokeless tobacco: Never Used  . Alcohol use No    Family History Family History  Problem Relation Age of Onset  . Diabetes Father   . Heart disease Father     Allergies  Allergen Reactions  . Percocet [Oxycodone-Acetaminophen] Itching  . Percodan [Oxycodone-Aspirin] Itching     REVIEW OF SYSTEMS (Negative unless checked)  Constitutional: [] Weight loss  [] Fever  [] Chills Cardiac: [] Chest pain   [] Chest pressure   [] Palpitations   [] Shortness of breath when laying flat   [] Shortness of breath at rest   [] Shortness of breath with exertion. Vascular:  [] Pain in legs with walking   [] Pain in legs at rest   [] Pain in legs when laying flat   [x] Claudication   [] Pain in feet when walking  [] Pain in feet at rest  [] Pain in feet when laying flat   [] History of DVT   [] Phlebitis   [x] Swelling in legs   [] Varicose veins   [] Non-healing ulcers Pulmonary:   [] Uses home oxygen   [] Productive cough   [] Hemoptysis   [] Wheeze  [] COPD   [] Asthma Neurologic:  [] Dizziness  [] Blackouts   [] Seizures   [] History of stroke   [] History of TIA  [] Aphasia   [] Temporary blindness   [] Dysphagia   [] Weakness or numbness in arms   [] Weakness or numbness in legs Musculoskeletal:  [x] Arthritis   [] Joint swelling   [] Joint pain   [] Low back pain Hematologic:  [] Easy bruising  [] Easy bleeding   [] Hypercoagulable state   [] Anemic   Gastrointestinal:  [] Blood in stool   [] Vomiting blood  [] Gastroesophageal reflux/heartburn   [] Abdominal pain Genitourinary:  [] Chronic kidney disease   [] Difficult urination  [] Frequent urination  [] Burning with urination    [] Hematuria Skin:  [] Rashes   [] Ulcers   [] Wounds Psychological:  [] History of anxiety   []  History of major depression.  Physical Examination  BP (!) 220/80 (BP Location: Right Arm)   Pulse 76   Resp 14   Ht 5' 3"  (1.6 m)   Wt 100 lb (45.4 kg)   BMI 17.71 kg/m  Gen:  WD/WN, NAD Head: [place]/AT, No temporalis wasting. Ear/Nose/Throat: Hearing grossly intact, nares w/o erythema or drainage, trachea midline Eyes: Conjunctiva clear. Sclera non-icteric Neck: Supple.  No JVD.  Pulmonary:  Good air movement, no use of accessory muscles.  Cardiac: RRR, normal S1, S2 Vascular:  Vessel Right Left  Radial Palpable Palpable  PT 1+ Palpable Not Palpable  DP Trace Palpable 1+ Palpable    Musculoskeletal: M/S 5/5 throughout.  No deformity or atrophy. Trace right lower extremity edema, 2+ left lower extremity edema. Neurologic: Sensation grossly intact in extremities.  Symmetrical.  Speech is fluent.  Psychiatric: Judgment intact, Mood & affect appropriate for pt's clinical situation. Dermatologic: No rashes or ulcers noted.  No cellulitis or open wounds.       Labs Recent Results (from the past 2160 hour(s))  Potassium     Status: None   Collection Time: 06/19/17  8:04 AM  Result Value Ref Range   Potassium 4.0 3.5 - 5.1 mmol/L  BUN     Status: Abnormal   Collection Time: 07/12/17  7:20 AM  Result Value Ref Range   BUN 31 (H) 6 - 20 mg/dL  Creatinine, serum     Status: Abnormal   Collection Time: 07/12/17  7:20 AM  Result Value Ref Range   Creatinine, Ser 1.30 (H) 0.44 - 1.00 mg/dL   GFR calc non Af Amer 36 (L) >60 mL/min   GFR calc Af Amer 42 (L) >60 mL/min    Comment: (NOTE) The eGFR has been calculated using the CKD EPI equation. This calculation has not been validated in all clinical situations. eGFR's persistently <60 mL/min signify possible Chronic Kidney Disease.     Radiology No results  found.    Assessment/Plan  Hyperlipidemia lipid control important in reducing the progression of atherosclerotic disease. Continue statin therapy  Atherosclerosis with claudication of extremity ([place]) Her noninvasive studies today show stable right ABI of 0.53 and a markedly improved left ABI of 0.98. Her symptoms are markedly improved and she denies any current lifestyle limiting claudication, ischemic rest pain, or ulceration. At this point, we will plan to recheck her perfusion in about 3 months. She will contact our office with any problems in the interim.  Renal artery stenosis (HCC) Status post unilateral renal artery revascularization, but did have bilateral disease. We will recheck her renal artery duplex in 3 months. If her blood pressure control remains suboptimal, angiography with consideration for intervention would still be planned.    Leotis Pain, MD  08/07/2017 4:53 PM    This note was created with Dragon medical transcription system.  Any errors from dictation are purely unintentional

## 2017-08-10 ENCOUNTER — Encounter (INDEPENDENT_AMBULATORY_CARE_PROVIDER_SITE_OTHER): Payer: Medicare Other | Admitting: Vascular Surgery

## 2017-08-28 ENCOUNTER — Other Ambulatory Visit (INDEPENDENT_AMBULATORY_CARE_PROVIDER_SITE_OTHER): Payer: Self-pay | Admitting: Vascular Surgery

## 2017-08-30 ENCOUNTER — Telehealth (INDEPENDENT_AMBULATORY_CARE_PROVIDER_SITE_OTHER): Payer: Self-pay | Admitting: Vascular Surgery

## 2017-09-21 DIAGNOSIS — R0789 Other chest pain: Secondary | ICD-10-CM | POA: Insufficient documentation

## 2017-10-23 ENCOUNTER — Encounter (INDEPENDENT_AMBULATORY_CARE_PROVIDER_SITE_OTHER): Payer: Medicare Other

## 2017-10-23 ENCOUNTER — Ambulatory Visit (INDEPENDENT_AMBULATORY_CARE_PROVIDER_SITE_OTHER): Payer: Medicare Other

## 2017-10-23 ENCOUNTER — Ambulatory Visit (INDEPENDENT_AMBULATORY_CARE_PROVIDER_SITE_OTHER): Payer: Medicare Other | Admitting: Vascular Surgery

## 2017-10-23 ENCOUNTER — Encounter (INDEPENDENT_AMBULATORY_CARE_PROVIDER_SITE_OTHER): Payer: Self-pay | Admitting: Vascular Surgery

## 2017-10-23 ENCOUNTER — Encounter (INDEPENDENT_AMBULATORY_CARE_PROVIDER_SITE_OTHER): Payer: Self-pay

## 2017-10-23 VITALS — BP 191/76 | HR 72 | Resp 15 | Ht 62.0 in | Wt 95.0 lb

## 2017-10-23 DIAGNOSIS — I6523 Occlusion and stenosis of bilateral carotid arteries: Secondary | ICD-10-CM | POA: Diagnosis not present

## 2017-10-23 DIAGNOSIS — E785 Hyperlipidemia, unspecified: Secondary | ICD-10-CM | POA: Diagnosis not present

## 2017-10-23 DIAGNOSIS — I701 Atherosclerosis of renal artery: Secondary | ICD-10-CM

## 2017-10-23 DIAGNOSIS — I70219 Atherosclerosis of native arteries of extremities with intermittent claudication, unspecified extremity: Secondary | ICD-10-CM | POA: Diagnosis not present

## 2017-10-23 NOTE — Patient Instructions (Signed)
Carotid Artery Disease The carotid arteries are arteries on both sides of the neck. They carry blood to the brain. Carotid artery disease is when the arteries get smaller (narrow) or get blocked. If these arteries get smaller or get blocked, you are more likely to have a stroke or warning stroke (transient ischemic attack). Follow these instructions at home:  Take medicines as told by your doctor. Make sure you understand all your medicine instructions. Do not stop your medicines without talking to your doctor first.  Follow your doctor's diet instructions. It is important to eat a healthy diet that includes plenty of: ? Fresh fruits. ? Vegetables. ? Lean meats.  Avoid: ? High-fat foods. ? High-sodium foods. ? Foods that are fried, overly processed, or have poor nutritional value.  Stay a healthy weight.  Stay active. Get at least 30 minutes of activity every day.  Do not smoke.  Limit alcohol use to: ? No more than 2 drinks a day for men. ? No more than 1 drink a day for women who are not pregnant.  Do not use illegal drugs.  Keep all doctor visits as told. Get help right away if:  You have sudden weakness or loss of feeling (numbness) on one side of the body, such as the face, arm, or leg.  You have sudden confusion.  You have trouble speaking (aphasia) or understanding.  You have sudden trouble seeing out of one or both eyes.  You have sudden trouble walking.  You have dizziness or feel like you might pass out (faint).  You have a loss of balance or your movements are not steady (uncoordinated).  You have a sudden, severe headache with no known cause.  You have trouble swallowing (dysphagia). Call your local emergency services (911 in [place] Do notdrive yourself to the clinic or hospital. This information is not intended to replace advice given to you by your health care provider. Make sure you discuss any questions you have with your health care  provider. Document Released: 10/30/2012 Document Revised: 04/20/2016 Document Reviewed: 05/14/2013 Elsevier Interactive Patient Education  2018 Elsevier Inc.  

## 2017-10-23 NOTE — Assessment & Plan Note (Signed)
Her carotid duplex today reveals stable 1-39% right ICA stenosis and stable 40-59% left ICA stenosis.  This has shown no progression from her study one year ago. Doing well.  No focal symptoms.  Continue current medical regimen and recheck in 1 year with duplex

## 2017-10-23 NOTE — Progress Notes (Signed)
MRN : [id number]  Dawn [name] is a 83 y.o. ([birth year]) female who presents with chief complaint of  Chief Complaint  Patient presents with  . Follow-up    49yr. carotid  .  History of Present Illness: Patient returns in follow-up of her carotid disease.  Since her last visit for carotid disease 1 year ago, she has undergone bilateral lower extremity revascularization with good results and marked improvement in her lower extremity function.  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  She denies any focal neurologic symptoms.  Her carotid duplex today reveals stable 1-39% right ICA stenosis and stable 40-59% left ICA stenosis.  This has shown no progression from her study one year ago.  Current Outpatient Medications  Medication Sig Dispense Refill  . aspirin EC 81 MG tablet Take 81 mg by mouth daily.    Marland Kitchen atorvastatin (LIPITOR) 20 MG tablet TAKE 1 TABLET BY MOUTH ONCE DAILY AT  6PM 30 tablet 3  . Cholecalciferol (VITAMIN D3) 400 units CAPS Take 400 Units by mouth daily.    . cloNIDine (CATAPRES) 0.1 MG tablet Take 1 tablet (0.1 mg total) by mouth 2 (two) times daily. (Patient taking differently: Take 0.1-0.2 mg by mouth 2 (two) times daily. 0.1 mg in the morning & 0.2 mg in the evening) 60 tablet 0  . clopidogrel (PLAVIX) 75 MG tablet TAKE 1 TABLET BY MOUTH ONCE DAILY 30 tablet 3  . Cyanocobalamin (B-12) 5000 MCG CAPS Take 5,000 mcg by mouth daily.     . hydrochlorothiazide (HYDRODIURIL) 25 MG tablet Take 25 mg by mouth daily.     . indomethacin (INDOCIN) 50 MG capsule Take 50 mg by mouth 2 (two) times daily as needed (for gout flares).    Marland Kitchen ketorolac (ACULAR) 0.4 % SOLN Place 1 drop into the left eye 4 (four) times daily.    . Multiple Minerals-Vitamins (LEG-GESIC PO) Take 1 tablet by mouth at bedtime. LEGATRIN PM    . naproxen sodium (ANAPROX) 220 MG tablet Take 440 mg by mouth daily.    . traMADol (ULTRAM) 50 MG tablet Take 1 tablet (50 mg total) by mouth every 6 (six)  hours as needed. (Patient taking differently: Take 50 mg by mouth daily. ) 30 tablet 1  . tretinoin (RETIN-A) 0.05 % cream Apply 1 application topically every other day. EVERY OTHER NIGHT     No current facility-administered medications for this visit.     Past Medical History:  Diagnosis Date  . Cancer ([place])    skin cancer  . 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 Hypertension   . Sciatica   . Sciatica     Past Surgical History:  Procedure Laterality Date  . ABDOMINAL HYSTERECTOMY    . APPENDECTOMY    . CATARACT EXTRACTION, BILATERAL    . CHOLECYSTECTOMY    . ENDARTERECTOMY FEMORAL Right 04/19/2017   Procedure: ENDARTERECTOMY COMMON FEMORAL ARTERY, PROFUNDA  FEMORIS ARTERY, and  SUPERFICIAL FEMORAL ARTERY;  Surgeon: Algernon Huxley, MD;  Location: [facility];  Service: Vascular;  Laterality: Right;  . HIP SURGERY Bilateral   . LOWER EXTREMITY ANGIOGRAM Right 04/19/2017   Procedure: LOWER EXTREMITY ANGIOGRAM WITH SUPERFICIAL FEMORAL ARTERY STENT PLACEMENT;  Surgeon: Algernon Huxley, MD;  Location: [facility];  Service: Vascular;  Laterality: Right;  . LOWER EXTREMITY ANGIOGRAPHY Right 04/18/2017   Procedure: Lower Extremity Angiography;  Surgeon: Algernon Huxley, MD;  Location: [facility] [facility];  Service: Cardiovascular;  Laterality: Right;  . LOWER EXTREMITY ANGIOGRAPHY Left 07/12/2017   Procedure: Lower Extremity Angiography;  Surgeon: Algernon Huxley, MD;  Location: [place] [facility];  Service: Cardiovascular;  Laterality: Left;  . LOWER EXTREMITY INTERVENTION  07/12/2017   Procedure: Lower Extremity Intervention;  Surgeon: Algernon Huxley, MD;  Location: [place] [facility];  Service: Cardiovascular;;  . RENAL ANGIOGRAPHY N/A 06/21/2017   Procedure: Renal Angiography;  Surgeon: Algernon Huxley, MD;  Location: [facility] [facility];  Service: Cardiovascular;  Laterality: N/A;  . SHOULDER SURGERY Bilateral   . TOTAL HIP ARTHROPLASTY Bilateral      Social History Social History   Tobacco Use  . Smoking status: Former Smoker    Types: Cigarettes  . Smokeless tobacco: Never Used  Substance Use Topics  . Alcohol use: No  . Drug use: No    Family History Family History  Problem Relation Age of Onset  . Diabetes Father   . Heart disease Father     Allergies  Allergen Reactions  . Percocet [Oxycodone-Acetaminophen] Itching  . Percodan [Oxycodone-Aspirin] Itching     REVIEW OF SYSTEMS (Negative unless checked)  Constitutional: [] Weight loss  [] Fever  [] Chills Cardiac: [] Chest pain   [] Chest pressure   [] Palpitations   [] Shortness of breath when laying flat   [] Shortness of breath at rest   [] Shortness of breath with exertion. Vascular:  [] Pain in legs with walking   [] Pain in legs at rest   [] Pain in legs when laying flat   [x] Claudication   [] Pain in feet when walking  [] Pain in feet at rest  [] Pain in feet when laying flat   [] History of DVT   [] Phlebitis   [] Swelling in legs   [] Varicose veins   [] Non-healing ulcers Pulmonary:   [] Uses home oxygen   [] Productive cough   [] Hemoptysis   [] Wheeze  [] COPD   [] Asthma Neurologic:  [] Dizziness  [] Blackouts   [] Seizures   [] History of stroke   [] History of TIA  [] Aphasia   [] Temporary blindness   [] Dysphagia   [] Weakness or numbness in arms   [] Weakness or numbness in legs Musculoskeletal:  [x] Arthritis   [] Joint swelling   [] Joint pain   [x] Low back pain Hematologic:  [] Easy bruising  [] Easy bleeding   [] Hypercoagulable state   [] Anemic  [] Hepatitis Gastrointestinal:  [] Blood in stool   [] Vomiting blood  [] Gastroesophageal reflux/heartburn   [] Difficulty swallowing. Genitourinary:  [x] Chronic kidney disease   [] Difficult urination  [] Frequent urination  [] Burning with urination   [] Blood in urine Skin:  [] Rashes   [] Ulcers   [] Wounds Psychological:  [] History of anxiety   []  History of major depression.  Physical Examination  Vitals:   10/23/17 1459  BP: (!) 191/76   Pulse: 72  Resp: 15  Weight: 95 lb (43.1 kg)  Height: 5\' 2"  (1.575 m)   Body mass index is 17.38 kg/m. Gen:  WD/WN, NAD. Appears younger than stated age. Head: [place]/AT, No temporalis wasting. Ear/Nose/Throat: Hearing grossly intact, nares w/o erythema or drainage, trachea midline Eyes: Conjunctiva clear. Sclera non-icteric Neck: Supple.  No bruit or JVD.  Pulmonary:  Good air movement, equal and clear to auscultation bilaterally.  Cardiac: RRR, normal S1, S2 Vascular:  Vessel Right Left  Radial Palpable Palpable  Musculoskeletal: M/S 5/5 throughout.  No deformity or atrophy Neurologic: CN 2-12 intact. Sensation grossly intact in extremities.  Symmetrical.  Speech is fluent. Motor exam as listed above. Psychiatric: Judgment intact, Mood & affect appropriate for pt's clinical situation. Dermatologic: No rashes or ulcers noted.  No cellulitis or open wounds.      CBC Lab Results  Component Value Date   WBC 4.9 04/18/2017   HGB 13.6 04/18/2017   HCT 39.8 04/18/2017   MCV 93.0 04/18/2017   PLT 170 04/18/2017    BMET    Component Value Date/Time   NA 138 06/08/2016 1458   K 4.0 06/19/2017 0804   CL 98 (L) 06/08/2016 1458   CO2 30 06/08/2016 1458   GLUCOSE 91 06/08/2016 1458   BUN 31 (H) 07/12/2017 0720   CREATININE 1.30 (H) 07/12/2017 0720   CREATININE 0.92 03/13/2012 0805   CALCIUM 9.6 06/08/2016 1458   GFRNONAA 36 (L) 07/12/2017 0720   GFRNONAA 58 (L) 03/13/2012 0805   GFRAA 42 (L) 07/12/2017 0720   GFRAA >60 03/13/2012 0805   CrCl cannot be calculated (Patient's most recent lab result is older than the maximum 21 days allowed.).  COAG Lab Results  Component Value Date   INR 1.02 04/18/2017    Radiology No results found.    Assessment/Plan Hyperlipidemia lipid control important in reducing the progression of atherosclerotic disease. Continue statin therapy  Atherosclerosis with claudication of extremity  ([place]) Her noninvasive studies earlier this fall show stable right ABI of 0.53 and a markedly improved left ABI of 0.98. Her symptoms are markedly improved and she denies any current lifestyle limiting claudication, ischemic rest pain, or ulceration.  Renal artery stenosis (HCC) Status post unilateral renal artery revascularization, but did have bilateral disease. We will recheck her renal artery duplex in 3 months. If her blood pressure control remains suboptimal, angiography with consideration for intervention would still be planned.   Carotid stenosis Her carotid duplex today reveals stable 1-39% right ICA stenosis and stable 40-59% left ICA stenosis.  This has shown no progression from her study one year ago. Doing well.  No focal symptoms.  Continue current medical regimen and recheck in 1 year with duplex    Leotis Pain, MD  10/23/2017 5:53 PM    This note was created with Dragon medical transcription system.  Any errors from dictation are purely unintentional

## 2017-11-01 ENCOUNTER — Telehealth (INDEPENDENT_AMBULATORY_CARE_PROVIDER_SITE_OTHER): Payer: Self-pay

## 2017-11-01 NOTE — Telephone Encounter (Signed)
Patient's son called stating that the patient's foot is purplish and numb and wanted to know should she be seen today. She has an appt on 11/09/17 with ABI's , carotid and be seen by Dr. Lucky Cowboy. Per Hezzie Bump PA the patient needs to wear compression hose and elevate the leg. The son stated she has not worn any compression hose. I let him know that if she gets worse or develops other symptoms to call our office or go to the ED for evaluation.

## 2017-11-09 ENCOUNTER — Encounter (INDEPENDENT_AMBULATORY_CARE_PROVIDER_SITE_OTHER): Payer: Self-pay | Admitting: Vascular Surgery

## 2017-11-09 ENCOUNTER — Encounter: Payer: Self-pay | Admitting: Vascular Surgery

## 2017-11-09 ENCOUNTER — Other Ambulatory Visit (INDEPENDENT_AMBULATORY_CARE_PROVIDER_SITE_OTHER): Payer: Self-pay | Admitting: Vascular Surgery

## 2017-11-09 ENCOUNTER — Ambulatory Visit (INDEPENDENT_AMBULATORY_CARE_PROVIDER_SITE_OTHER): Payer: Medicare Other | Admitting: Vascular Surgery

## 2017-11-09 ENCOUNTER — Inpatient Hospital Stay
Admission: AD | Admit: 2017-11-09 | Discharge: 2017-11-22 | DRG: 270 | Disposition: A | Discharge status: Skilled Nursing Facility | Payer: Medicare Other | Source: Ambulatory Visit | Attending: Vascular Surgery | Admitting: Vascular Surgery

## 2017-11-09 ENCOUNTER — Ambulatory Visit (INDEPENDENT_AMBULATORY_CARE_PROVIDER_SITE_OTHER): Payer: Medicare Other

## 2017-11-09 ENCOUNTER — Encounter
Admission: AD | Disposition: A | Discharge status: Skilled Nursing Facility | Payer: Self-pay | Source: Ambulatory Visit | Attending: Vascular Surgery

## 2017-11-09 VITALS — BP 179/74 | HR 74 | Resp 16

## 2017-11-09 DIAGNOSIS — I10 Essential (primary) hypertension: Secondary | ICD-10-CM | POA: Diagnosis present

## 2017-11-09 DIAGNOSIS — Z90710 Acquired absence of both cervix and uterus: Secondary | ICD-10-CM

## 2017-11-09 DIAGNOSIS — T465X5A Adverse effect of other antihypertensive drugs, initial encounter: Secondary | ICD-10-CM | POA: Diagnosis not present

## 2017-11-09 DIAGNOSIS — I70222 Atherosclerosis of native arteries of extremities with rest pain, left leg: Secondary | ICD-10-CM

## 2017-11-09 DIAGNOSIS — R627 Adult failure to thrive: Secondary | ICD-10-CM | POA: Diagnosis not present

## 2017-11-09 DIAGNOSIS — E43 Unspecified severe protein-calorie malnutrition: Secondary | ICD-10-CM | POA: Diagnosis present

## 2017-11-09 DIAGNOSIS — I708 Atherosclerosis of other arteries: Secondary | ICD-10-CM | POA: Diagnosis present

## 2017-11-09 DIAGNOSIS — Z9049 Acquired absence of other specified parts of digestive tract: Secondary | ICD-10-CM | POA: Diagnosis not present

## 2017-11-09 DIAGNOSIS — Z681 Body mass index (BMI) 19 or less, adult: Secondary | ICD-10-CM

## 2017-11-09 DIAGNOSIS — Z96643 Presence of artificial hip joint, bilateral: Secondary | ICD-10-CM | POA: Diagnosis present

## 2017-11-09 DIAGNOSIS — I70219 Atherosclerosis of native arteries of extremities with intermittent claudication, unspecified extremity: Secondary | ICD-10-CM

## 2017-11-09 DIAGNOSIS — B370 Candidal stomatitis: Secondary | ICD-10-CM | POA: Diagnosis not present

## 2017-11-09 DIAGNOSIS — R001 Bradycardia, unspecified: Secondary | ICD-10-CM | POA: Diagnosis not present

## 2017-11-09 DIAGNOSIS — I739 Peripheral vascular disease, unspecified: Secondary | ICD-10-CM

## 2017-11-09 DIAGNOSIS — D638 Anemia in other chronic diseases classified elsewhere: Secondary | ICD-10-CM | POA: Diagnosis present

## 2017-11-09 DIAGNOSIS — I701 Atherosclerosis of renal artery: Secondary | ICD-10-CM | POA: Diagnosis not present

## 2017-11-09 DIAGNOSIS — E871 Hypo-osmolality and hyponatremia: Secondary | ICD-10-CM | POA: Diagnosis not present

## 2017-11-09 DIAGNOSIS — Z885 Allergy status to narcotic agent status: Secondary | ICD-10-CM | POA: Diagnosis not present

## 2017-11-09 DIAGNOSIS — E785 Hyperlipidemia, unspecified: Secondary | ICD-10-CM | POA: Diagnosis not present

## 2017-11-09 DIAGNOSIS — I998 Other disorder of circulatory system: Secondary | ICD-10-CM | POA: Insufficient documentation

## 2017-11-09 DIAGNOSIS — M5430 Sciatica, unspecified side: Secondary | ICD-10-CM | POA: Diagnosis present

## 2017-11-09 DIAGNOSIS — Z87891 Personal history of nicotine dependence: Secondary | ICD-10-CM

## 2017-11-09 DIAGNOSIS — E876 Hypokalemia: Secondary | ICD-10-CM | POA: Diagnosis present

## 2017-11-09 DIAGNOSIS — Z85828 Personal history of other malignant neoplasm of skin: Secondary | ICD-10-CM

## 2017-11-09 DIAGNOSIS — Z95828 Presence of other vascular implants and grafts: Secondary | ICD-10-CM

## 2017-11-09 DIAGNOSIS — I70229 Atherosclerosis of native arteries of extremities with rest pain, unspecified extremity: Secondary | ICD-10-CM | POA: Diagnosis present

## 2017-11-09 HISTORY — PX: LOWER EXTREMITY ANGIOGRAPHY: CATH118251

## 2017-11-09 HISTORY — PX: LOWER EXTREMITY INTERVENTION: CATH118252

## 2017-11-09 LAB — BUN: BUN: 40 mg/dL — ABNORMAL HIGH (ref 6–20)

## 2017-11-09 LAB — CREATININE, SERUM
Creatinine, Ser: 1.02 mg/dL — ABNORMAL HIGH (ref 0.44–1.00)
GFR calc Af Amer: 56 mL/min — ABNORMAL LOW (ref >60)
GFR calc non Af Amer: 48 mL/min — ABNORMAL LOW (ref >60)

## 2017-11-09 SURGERY — LOWER EXTREMITY ANGIOGRAPHY
Anesthesia: Moderate Sedation | Laterality: Left

## 2017-11-09 MED ORDER — MIDAZOLAM HCL 2 MG/2ML IJ SOLN
INTRAMUSCULAR | Status: DC | PRN
  Administered 2017-11-09: 2 mg via INTRAVENOUS
  Administered 2017-11-09: 1 mg via INTRAVENOUS
  Administered 2017-11-09: 2 mg via INTRAVENOUS
  Administered 2017-11-09 (×2): 1 mg via INTRAVENOUS

## 2017-11-09 MED ORDER — DOCUSATE SODIUM 50 MG PO CAPS: 100.0000 mg | ORAL_CAPSULE | Freq: Two times a day (BID) | ORAL | Status: DC

## 2017-11-09 MED ORDER — ACETAMINOPHEN 325 MG PO TABS: 650.0000 mg | ORAL_TABLET | Freq: Four times a day (QID) | ORAL | Status: DC | PRN

## 2017-11-09 MED ORDER — FENTANYL CITRATE (PF) 100 MCG/2ML IJ SOLN
INTRAMUSCULAR | Status: AC
  Filled 2017-11-09: qty 2

## 2017-11-09 MED ORDER — MORPHINE SULFATE (PF) 4 MG/ML IV SOLN: 2.0000 mg | INTRAVENOUS | Status: DC | PRN

## 2017-11-09 MED ORDER — SODIUM CHLORIDE 0.9 % IJ SOLN
INTRAMUSCULAR | Status: AC
Start: 2017-11-09 — End: 2017-11-09
  Filled 2017-11-09: qty 50

## 2017-11-09 MED ORDER — HYDROCHLOROTHIAZIDE 25 MG PO TABS
25.0000 mg | ORAL_TABLET | Freq: Every day | ORAL | Status: DC
  Administered 2017-11-09 – 2017-11-18 (×10): 25 mg via ORAL
  Filled 2017-11-09 (×10): qty 1

## 2017-11-09 MED ORDER — LIDOCAINE-EPINEPHRINE (PF) 1 %-1:200000 IJ SOLN
INTRAMUSCULAR | Status: AC
  Filled 2017-11-09: qty 30

## 2017-11-09 MED ORDER — ACETAMINOPHEN 325 MG PO TABS
650.0000 mg | ORAL_TABLET | Freq: Four times a day (QID) | ORAL | Status: DC | PRN
Start: 2017-11-09 — End: 2017-11-22

## 2017-11-09 MED ORDER — SORBITOL 70 % SOLN: 30.0000 mL | Freq: Every day | Status: DC | PRN

## 2017-11-09 MED ORDER — FAMOTIDINE 20 MG PO TABS: 40.0000 mg | ORAL_TABLET | ORAL | Status: DC | PRN

## 2017-11-09 MED ORDER — SODIUM CHLORIDE 0.9 % IV SOLN: 4.0000 mg | Freq: Four times a day (QID) | INTRAVENOUS | Status: DC | PRN

## 2017-11-09 MED ORDER — TIROFIBAN HCL IV 12.5 MG/250 ML
INTRAVENOUS | Status: AC
  Administered 2017-11-09: 1077.5 ug via INTRAVENOUS
  Filled 2017-11-09: qty 250

## 2017-11-09 MED ORDER — CLOPIDOGREL BISULFATE 75 MG PO TABS: 75.0000 mg | ORAL_TABLET | Freq: Every day | ORAL | Status: DC

## 2017-11-09 MED ORDER — ONDANSETRON HCL 4 MG/2ML IJ SOLN: 4.0000 mg | Freq: Four times a day (QID) | INTRAMUSCULAR | Status: DC | PRN

## 2017-11-09 MED ORDER — CLONIDINE HCL 0.1 MG PO TABS
0.1000 mg | ORAL_TABLET | Freq: Two times a day (BID) | ORAL | Status: DC
  Administered 2017-11-09 – 2017-11-10 (×3): 0.2 mg via ORAL
  Administered 2017-11-11 (×2): 0.1 mg via ORAL
  Administered 2017-11-12 – 2017-11-13 (×3): 0.2 mg via ORAL
  Administered 2017-11-13 – 2017-11-14 (×2): 0.1 mg via ORAL
  Filled 2017-11-09 (×3): qty 2
  Filled 2017-11-09 (×2): qty 1
  Filled 2017-11-09 (×2): qty 2
  Filled 2017-11-09 (×2): qty 1
  Filled 2017-11-09: qty 2
  Filled 2017-11-09: qty 1

## 2017-11-09 MED ORDER — ASPIRIN EC 81 MG PO TBEC: 81.0000 mg | DELAYED_RELEASE_TABLET | Freq: Every day | ORAL | Status: DC

## 2017-11-09 MED ORDER — MIDAZOLAM HCL 5 MG/5ML IJ SOLN
INTRAMUSCULAR | Status: AC
  Filled 2017-11-09: qty 5

## 2017-11-09 MED ORDER — TRAMADOL HCL 50 MG PO TABS
50.0000 mg | ORAL_TABLET | Freq: Four times a day (QID) | ORAL | Status: DC | PRN
  Administered 2017-11-10 – 2017-11-13 (×6): 50 mg via ORAL
  Filled 2017-11-09 (×6): qty 1

## 2017-11-09 MED ORDER — ASPIRIN EC 81 MG PO TBEC
81.0000 mg | DELAYED_RELEASE_TABLET | Freq: Every day | ORAL | Status: DC
  Administered 2017-11-09 – 2017-11-13 (×5): 81 mg via ORAL
  Filled 2017-11-09 (×6): qty 1

## 2017-11-09 MED ORDER — TIROFIBAN (AGGRASTAT) BOLUS VIA INFUSION
25.0000 ug/kg | Freq: Once | INTRAVENOUS | Status: AC
  Administered 2017-11-09: 1077.5 ug via INTRAVENOUS

## 2017-11-09 MED ORDER — FLEET ENEMA 7-19 GM/118ML RE ENEM: 1.0000 | ENEMA | Freq: Once | RECTAL | Status: DC | PRN

## 2017-11-09 MED ORDER — TIROFIBAN HCL IN NACL 5-0.9 MG/100ML-% IV SOLN
0.0750 ug/kg/min | INTRAVENOUS | Status: DC
  Administered 2017-11-09: 0.075 ug/kg/min via INTRAVENOUS
  Filled 2017-11-09: qty 100

## 2017-11-09 MED ORDER — HEPARIN (PORCINE) IN NACL 2-0.9 UNIT/ML-% IJ SOLN
INTRAMUSCULAR | Status: AC
  Filled 2017-11-09: qty 1000

## 2017-11-09 MED ORDER — HEPARIN SODIUM (PORCINE) 1000 UNIT/ML IJ SOLN
INTRAMUSCULAR | Status: DC | PRN
  Administered 2017-11-09: 4000 [IU] via INTRAVENOUS

## 2017-11-09 MED ORDER — NITROGLYCERIN 5 MG/ML IV SOLN
INTRAVENOUS | Status: AC
  Filled 2017-11-09: qty 10

## 2017-11-09 MED ORDER — ATORVASTATIN CALCIUM 20 MG PO TABS
20.0000 mg | ORAL_TABLET | Freq: Every day | ORAL | Status: DC
  Administered 2017-11-09 – 2017-11-21 (×13): 20 mg via ORAL
  Filled 2017-11-09 (×13): qty 1

## 2017-11-09 MED ORDER — MORPHINE SULFATE (PF) 4 MG/ML IV SOLN
2.0000 mg | INTRAVENOUS | Status: DC | PRN
  Administered 2017-11-10 – 2017-11-17 (×13): 2 mg via INTRAVENOUS
  Filled 2017-11-09 (×14): qty 1

## 2017-11-09 MED ORDER — APIXABAN 2.5 MG PO TABS
2.5000 mg | ORAL_TABLET | Freq: Two times a day (BID) | ORAL | Status: DC
  Filled 2017-11-09: qty 1

## 2017-11-09 MED ORDER — ACETAMINOPHEN 650 MG RE SUPP: 650.0000 mg | Freq: Four times a day (QID) | RECTAL | Status: DC | PRN

## 2017-11-09 MED ORDER — SODIUM CHLORIDE 0.9% FLUSH: 3.0000 mL | Freq: Two times a day (BID) | INTRAVENOUS | Status: DC

## 2017-11-09 MED ORDER — FENTANYL CITRATE (PF) 100 MCG/2ML IJ SOLN
INTRAMUSCULAR | Status: DC | PRN
  Administered 2017-11-09 (×2): 25 ug via INTRAVENOUS
  Administered 2017-11-09: 50 ug via INTRAVENOUS
  Administered 2017-11-09 (×3): 25 ug via INTRAVENOUS

## 2017-11-09 MED ORDER — ONDANSETRON HCL 4 MG PO TABS
4.0000 mg | ORAL_TABLET | Freq: Four times a day (QID) | ORAL | Status: DC | PRN
Start: 2017-11-09 — End: 2017-11-22

## 2017-11-09 MED ORDER — METHYLPREDNISOLONE SODIUM SUCC 125 MG IJ SOLR: 125.0000 mg | INTRAMUSCULAR | Status: DC | PRN

## 2017-11-09 MED ORDER — MAGNESIUM HYDROXIDE 400 MG/5ML PO SUSP: 30.0000 mL | Freq: Every day | ORAL | Status: DC | PRN

## 2017-11-09 MED ORDER — DEXTROSE-NACL 5-0.9 % IV SOLN: INTRAVENOUS | Status: DC

## 2017-11-09 MED ORDER — ALTEPLASE 2 MG IJ SOLR
INTRAMUSCULAR | Status: AC
  Filled 2017-11-09: qty 8

## 2017-11-09 MED ORDER — HYDROMORPHONE HCL 1 MG/ML IJ SOLN
1.0000 mg | Freq: Once | INTRAMUSCULAR | Status: AC | PRN
  Administered 2017-11-09: 1 mg via INTRAVENOUS
  Filled 2017-11-09: qty 1

## 2017-11-09 MED ORDER — INDOMETHACIN 50 MG PO CAPS
50.0000 mg | ORAL_CAPSULE | Freq: Two times a day (BID) | ORAL | Status: DC | PRN
  Filled 2017-11-09: qty 1

## 2017-11-09 MED ORDER — IOPAMIDOL (ISOVUE-300) INJECTION 61%
INTRAVENOUS | Status: DC | PRN
Start: 2017-11-09 — End: 2017-11-09
  Administered 2017-11-09: 110 mL via INTRAVENOUS

## 2017-11-09 MED ORDER — SODIUM CHLORIDE 0.9 % IV SOLN: INTRAVENOUS | Status: DC

## 2017-11-09 MED ORDER — ALTEPLASE 2 MG IJ SOLR
INTRAMUSCULAR | Status: DC | PRN
  Administered 2017-11-09: 8 mg

## 2017-11-09 MED ORDER — CEFAZOLIN SODIUM-DEXTROSE 2-4 GM/100ML-% IV SOLN
2.0000 g | Freq: Once | INTRAVENOUS | Status: AC
  Administered 2017-11-09: 2 g via INTRAVENOUS

## 2017-11-09 MED ORDER — ONDANSETRON HCL 4 MG PO TABS
4.0000 mg | ORAL_TABLET | Freq: Four times a day (QID) | ORAL | Status: DC | PRN
Start: 2017-11-09 — End: 2017-11-09

## 2017-11-09 MED ORDER — HYDROCODONE-ACETAMINOPHEN 5-325 MG PO TABS: 1.0000 | ORAL_TABLET | ORAL | Status: DC | PRN

## 2017-11-09 MED ORDER — SODIUM CHLORIDE 0.9 % IV SOLN
INTRAVENOUS | Status: DC
  Administered 2017-11-09: 12:00:00 via INTRAVENOUS

## 2017-11-09 MED ORDER — MIDAZOLAM HCL 2 MG/2ML IJ SOLN
INTRAMUSCULAR | Status: AC
  Filled 2017-11-09: qty 2

## 2017-11-09 SURGICAL SUPPLY — 32 items
BALLN LUTONIX 4X150X130 (BALLOONS) ×4
BALLN LUTONIX 5X120X130 (BALLOONS) ×4
BALLN LUTONIX DCB 6X60X130 (BALLOONS) ×4
BALLN ULTRVRSE 2.5X300X150 (BALLOONS) ×4
BALLN ULTRVRSE 5X300X130 (BALLOONS) ×4
BALLOON LUTONIX 4X150X130 (BALLOONS) ×2 IMPLANT
BALLOON LUTONIX 5X120X130 (BALLOONS) ×2 IMPLANT
BALLOON LUTONIX DCB 6X60X130 (BALLOONS) ×2 IMPLANT
BALLOON ULTRVRSE 2.5X300X150 (BALLOONS) ×2 IMPLANT
BALLOON ULTRVRSE 5X300X130 (BALLOONS) ×2 IMPLANT
CANISTER PENUMBRA MAX (MISCELLANEOUS) ×4 IMPLANT
CANNULA 5F STIFF (CANNULA) ×4 IMPLANT
CATH BEACON 5 .038 100 VERT TP (CATHETERS) ×4 IMPLANT
CATH INDIGO 6 ST-TIP 135CM (CATHETERS) ×3 IMPLANT
CATH INFUS 90CMX50CM (CATHETERS) ×2
CATH INFUS UNIFUSE 90X50 5FR (CATHETERS) ×2 IMPLANT
CATH PIG 70CM (CATHETERS) ×4 IMPLANT
DEVICE PRESTO INFLATION (MISCELLANEOUS) ×4 IMPLANT
DEVICE STARCLOSE SE CLOSURE (Vascular Products) ×4 IMPLANT
GLIDEWIRE ADV .035X260CM (WIRE) ×4 IMPLANT
KIT CATH CVC 3 LUMEN 7FR 8IN (MISCELLANEOUS) ×4 IMPLANT
PACK ANGIOGRAPHY (CUSTOM PROCEDURE TRAY) ×4 IMPLANT
SHEATH ANL2 6FRX45 HC (SHEATH) ×4 IMPLANT
SHEATH BRITE TIP 5FRX11 (SHEATH) ×4 IMPLANT
SHEATH PINNACLE ST 6F 45CM (SHEATH) ×4 IMPLANT
STENT VIABAHN 5X100X120 (Permanent Stent) ×4 IMPLANT
STENT VIABAHN 5X50X120 (Permanent Stent) ×4 IMPLANT
STENT VIABAHN 6X250X120 (Permanent Stent) ×4 IMPLANT
TUBING ASPIRATION INDIGO (MISCELLANEOUS) ×3 IMPLANT
TUBING CONTRAST HIGH PRESS 72 (TUBING) ×4 IMPLANT
WIRE G V18X300CM (WIRE) ×4 IMPLANT
WIRE J 3MM .035X145CM (WIRE) ×4 IMPLANT

## 2017-11-09 NOTE — Assessment & Plan Note (Signed)
Her noninvasive studies today demonstrate what appears to be either an iliac or a distal aortic occlusion which is new.  Her renal artery stent on the right is not well seen nor is her left renal artery.  Her left ABI is undetectable with extremely poor waveforms in her right ABI is stable at 0.5 with fair waveforms.  This is a profound drop in her left leg ABI from several months ago. This is a critical and limb threatening situation.  Patient will be admitted to the hospital and taken emergently to angiography later today to see if revascularization is an option.  Discussed that limb loss is clearly a possibility, and we will do anything we can to avoid that.  We discussed the risks and benefits of the procedure.  I will get her admitted to the hospital this morning.

## 2017-11-09 NOTE — Assessment & Plan Note (Signed)
Renal arteries not well seen today.  Consideration for intervention for her renal artery stenosis will need to be given going forward, but with her new found profound ischemia this is a more pressing issue currently.

## 2017-11-09 NOTE — H&P (Signed)
[facility] History & Physical Update  The patient was interviewed and re-examined.  The patient's previous History and Physical has been reviewed and is unchanged.  There is no change in the plan of care. We plan to proceed with the scheduled procedure.  Leotis Pain, MD  11/09/2017, 12:35 PM

## 2017-11-09 NOTE — Assessment & Plan Note (Signed)
blood pressure control important in reducing the progression of atherosclerotic disease. On appropriate oral medications.  

## 2017-11-09 NOTE — Op Note (Signed)
Dawn [name]   PROCEDURE NOTE  PROCEDURE: 1.  Right femoral triple lumen central venous catheter placement 2.  Right femoral vein cannulation under ultrasound guidance  PRE-OPERATIVE DIAGNOSIS: Ischemic left lower extremity, lack of IV access for her vascular procedure  POST-OPERATIVE DIAGNOSIS: same as above  SURGEON: Leotis Pain, MD  ANESTHESIA:  None  ESTIMATED BLOOD LOSS: 2 cc  FINDING(S): none  SPECIMEN(S):  none  INDICATIONS:   Dawn [name] is a 83 y.o. female who presents with an ischemic leg and needs an urgent procedure performed.  The nurses have been unable to gain a stable peripheral IV and clearly for her angiogram she will have a need for venous access.  Central venous catheter was placed for stable venous access for her procedure concomitant to her angiogram with her groin already prepped.  The patient is aware the risks of central venous catheter placement include but are not limited to: bleeding, infection, central venous injury, pneumothorax, possible venous stenosis, possible malpositioning in the venous system, and possible infections related to long-term catheter presence. The patient was aware of these risks and agreed to proceed.  DESCRIPTION: The patient was prepped with chloraprep and draped in the standard fashion for her angiogram.  Her peripheral IV was not stable and was lost prior to beginning her angiogram, so she will need a venous access for the procedure.  At this point, her groin was already prepped and draped and a femoral line will be placed for immediate access.  I anesthesized the groin cannulation site with 1% lidocaine then under ultrasound guidance, the right femoral vein was cannulated with the 18 gauge needle and a permanent image was recorded.  A J wire was then placed down in the inferior vena cava.  After a skin nick and dilatation, the triple lumen central venous catheter was placed over the wire and the wire  was removed.  Each port was aspirated and flushed with sterile normal saline.  The catheter was secured in placed with two interrupted stitches of 3-0 Silk tied to the catheter.  The catheter was dressed with sterile dressing.  Fluoroscopy showed the catheter tip near the iliac venous confluence.  We then proceeded with her angiogram now that durable venous access was obtained  COMPLICATIONS: none apparent  CONDITION: stable  Leotis Pain 11/09/2017, 4:55 PM     This note was created with Dragon Medical transcription system. Any errors in dictation are purely unintentional.

## 2017-11-09 NOTE — Op Note (Addendum)
[name] Percutaneous Study/Intervention Procedural Note   Date of Surgery: 11/09/2017  Surgeon(s):Jaleisa Brose   Assistants:none  Pre-operative Diagnosis: PAD with rest [name] left lower extremity, acute on chronic ischemia  Post-operative diagnosis: Same  Procedure(s) Performed: 1. Ultrasound guidance for vascular access right femoral artery 2. Catheter placement into left peroneal artery from right femoral approach 3. Aortogram and selective left lower extremity angiogram 4. Catheter directed thrombolytic therapy with 8 mg of TPA to the left SFA, popliteal artery, and tibioperoneal trunk 5. Mechanical thrombectomy with the penumbra cat 6 device to the left SFA, popliteal artery, tibioperoneal trunk, and peroneal arteries  6.  Percutaneous transluminal angioplasty of the left peroneal artery with 2.5 mm diameter by 30 cm length angioplasty balloon  7.  Percutaneous transluminal angioplasty of the left tibioperoneal trunk and popliteal artery with 4 mm diameter by 15 cm length Lutonix drug-coated angioplasty balloon  8.  Percutaneous transluminal angioplasty of the left proximal superficial femoral artery with 5 mm diameter by 12 cm length Lutonix drug-coated angioplasty balloon  9.  Viabahn stent placement to the left SFA and popliteal artery to the knee with 6 mm diameter by 25 cm length stent for greater than 50% residual stenosis and thrombus in multiple segments  10.  Viabahn stent placement to the left below-knee popliteal artery and tibioperoneal trunk with 5 mm diameter 10 cm length stent for high-grade residual stenosis and thrombus after above procedures  11.  Viabahn stent placement to the left distal tibioperoneal trunk and proximal peroneal arteries with 5 mm diameter by 5 cm length stent for high-grade residual stenosis after above procedures  12.  Percutaneous transluminal angioplasty  of the left external iliac artery with 6 mm diameter by 6 cm length Lutonix drug-coated angioplasty balloon 13. StarClose closure device right femoral artery  EBL: 315 cc  Contrast: 110 cc  Fluoro Time: 18 minutes  Moderate Conscious Sedation Time: approximately 90 minutes using 7 mg of Versed and 175 mcg of Fentanyl  Indications: Patient is a 83 y.o.female with an ischemic left leg to clinic today. The patient has noninvasive study showing profound drop in her left ABI which was now nondetectable.  3 months ago it was 0.9. The patient is brought in for angiography for further evaluation and potential treatment. Risks and benefits are discussed and informed consent is obtained  Procedure: The patient was identified and appropriate procedural time out was performed. The patient was then placed supine on the table and prepped and draped in the usual sterile fashion.Moderate conscious sedation was administered during a face to face encounter with the patient throughout the procedure with my supervision of the RN administering medicines and monitoring the patient's vital signs, pulse oximetry, telemetry and mental status throughout from the start of the procedure until the patient was taken to the recovery room. Ultrasound was used to evaluate the right common femoral artery. It was patent . A digital ultrasound image was acquired. A Seldinger needle was used to access the right common femoral artery under direct ultrasound guidance and a permanent image was performed. A 0.035 J wire was advanced without resistance and a 5Fr sheath was placed. Pigtail catheter was placed into the aorta and an AP aortogram was performed. This demonstrated that the right renal artery stent was widely patent.  The left renal artery had a very high-grade stenosis and possible short segment occlusion.  The aorta was diffusely calcific and irregular but without stenosis.  The right common iliac  artery  had about a 40% stenosis in the right external iliac artery had about a 30-40% stenosis.  The left common iliac artery but no significant stenosis but the left external iliac artery had about a 70% stenosis.. I then crossed the aortic bifurcation and advanced to the left femoral head. Selective left lower extremity angiogram was then performed. This demonstrated a near flush occlusion of the left superficial femoral artery with a nearly occlusive stenosis of greater than 90% of the proximal profunda femoris artery.  The profunda femoris was then diseased about 5-6 cm beyond its origin which would explain the very poor collateral flow even with failure of her previous SFA and popliteal stents.  Distal reconstitution was very poor and a faint peroneal artery appeared to reconstitute although it was very difficult to discern. The patient was systemically heparinized and a 6 French 45 cm sheath was then placed over the Terumo Advantage wire. I then used a Kumpe catheter and the advantage wire to navigate through the SFA and popliteal occlusions with no difficulty at all consistent with the acute nature of her acute on chronic disease.  Given this, I elected to place TPA.  Using a 90 cm total length 50 cm working length lytic catheter 8 mg of TPA were deployed in the left SFA, popliteal artery, and tibioperoneal trunk.  This was allowed to dwell for approximately 20 minutes.  I then perform mechanical thrombectomy initially and several more times throughout the procedure.  The penumbra cat 6 catheter was used to treat the left SFA, popliteal artery, and tibioperoneal trunk initially.  With this, we were able to see reconstitution of the peroneal artery although this was heavily diseased and nearly occluded at its origin.  There was still significant amount of residual thrombus within the SFA and popliteal arteries and another pass with the penumbra cat 6 device was performed.  There is also a high-grade residual  stenosis in the tibioperoneal trunk as well as in the proximal SFA just above the previously placed stents.  I then proceeded with angioplasty.  A 2.5 mm diameter by 30 cm length angioplasty balloon was used to treat the proximal and mid peroneal arteries up to the tibioperoneal trunk.  This was inflated to 8 atm for 1 minute.  The tibioperoneal trunk and below-knee popliteal artery just below the previously placed stents were then treated with a 4 mm diameter by 15 cm length Lutonix drug-coated angioplasty balloon inflated to 10 atm for 1 minute.  The proximal SFA was then treated with a 5 mm diameter by 12 cm length Lutonix drug-coated angioplasty balloon inflated to 8 atm for 1 minute the proximal and mid SFA were now patent without significant stenosis, but there was thrombus within the mid and distal portion of the SFA and no runoff was seen after these treatments.  Another pass with the penumbra cat 6 device was performed with return of some effluent but continued residual thrombus and stenosis.  At this point, the biggest issue appeared to be the popliteal artery just below the previously placed stent and occlusive thrombus and stenosis in the tibioperoneal trunk and proximal peroneal artery.  Angioplasty was again performed with minimal improvement.  Thrombectomy was then performed again with minimal improvement.  At this point, I did not see much other option than trying to take the stent down into the tibioperoneal trunk and up through the popliteal artery.  There also remained residual thrombus within the mid SFA and covering this with a covered stent  was determined to be the best option as well.  A 5 mm diameter by 10 cm length Viabahn stent was deployed to the distal edge of the tibioperoneal trunk just above the peroneal artery and just into the previously placed stents.  A 6 mm diameter by 25 cm length stent was then deployed from the popliteal artery at the knee to the mid SFA.  I postdilated with a  4 mm balloon just into the peroneal artery distally and then with a 5 mm balloon in the SFA and popliteal arteries.  At this point, the SFA and popliteal arteries did not have any significant residual stenosis of greater than 20% but there was still essentially no flow distally due to high-grade residual stenosis within the peroneal artery.  We had already treated this with angioplasty of 2.5 mm diameter and then treated it with the 4 mm balloon but high-grade residual stenosis remained.  I felt at this point extending a Viabahn stent down into the proximal peroneal artery would be our best option.  A 5 mm diameter by 5 cm length stent was then deployed with about 2 cm of overlap into the tibioperoneal trunk and about 3 cm into the peroneal artery.  This was postdilated with a 5 mm balloon and at this point there was only about a 40-50% residual stenosis in the remainder of the peroneal artery had read distally.  There was still somewhat sluggish flow likely due to inflow lesion in the iliac artery so we pulled back the sheath and treated this.  The left external iliac artery was treated with a 6 mm diameter by 6 cm length Lutonix drug-coated angioplasty balloon with only about a 20% residual stenosis after an inflation to 10 atm for 1 minute.  I still felt she may benefit from a femoral endarterectomy particularly of the profunda femoris artery and the common femoral at the origins of the SFA and profunda femoris artery, and we will see how she does with anticoagulation consider that going forward. I elected to terminate the procedure we had done all we could do from a percutaneous standpoint. The sheath was removed and StarClose closure device was deployed in the right femoral artery with excellent hemostatic result. The patient was taken to the recovery room in stable condition having tolerated the procedure well.  Findings:  Aortogram:The right renal artery stent was widely patent.  The left  renal artery had a very high-grade stenosis and possible short segment occlusion.  The aorta was diffusely calcific and irregular but without stenosis.  The right common iliac artery had about a 40% stenosis in the right external iliac artery had about a 30-40% stenosis.  The left common iliac artery but no significant stenosis but the left external iliac artery had about a 70% stenosis. Left lower Extremity: This demonstrated a near flush occlusion of the left superficial femoral artery with a nearly occlusive stenosis of greater than 90% of the proximal profunda femoris artery.  The profunda femoris was then diseased about 5-6 cm beyond its origin which would explain the very poor collateral flow even with failure of her previous SFA and popliteal stents.  Distal reconstitution was very poor and a faint peroneal artery appeared to reconstitute although it was very difficult to discern.   Disposition: Patient was taken to the recovery room in stable condition having tolerated the procedure well.  Complications: None  Dawn [name] 11/09/2017 2:58 PM   This note was created with Dragon Medical transcription system. Any  errors in dictation are purely unintentional.

## 2017-11-09 NOTE — Progress Notes (Signed)
MRN : [id number]  Dawn [name] is a 83 y.o. (05-Aug-1928) female who presents with chief complaint of  Chief Complaint  Patient presents with  . Follow-up    5mo abi,renal  .  History of Present Illness: Patient returns today in follow up of her PAD with profound worsening of her symptoms.  For about a week, she has had disabling, nonstop pain in her left foot and lower leg.  This started abruptly and woke her up at night.  She had been doing reasonably well although in hindsight she had had some worsening claudication symptoms over the past several weeks prior to this.  Her foot is cool and somewhat purplish in color.  The pain is 10 out of 10 and nonstop.  Her blood pressure has been high.  She was seen about a month ago for carotid artery disease which was stable. Her noninvasive studies today demonstrate what appears to be either an iliac or a distal aortic occlusion which is new.  Her renal artery stent on the right is not well seen nor is her left renal artery.  Her left ABI is undetectable with extremely poor waveforms in her right ABI is stable at 0.5 with fair waveforms.  This is a profound drop in her left leg ABI from several months ago.  Current Outpatient Medications  Medication Sig Dispense Refill  . aspirin EC 81 MG tablet Take 81 mg by mouth daily.    Marland Kitchen atorvastatin (LIPITOR) 20 MG tablet TAKE 1 TABLET BY MOUTH ONCE DAILY AT  6PM 30 tablet 3  . Cholecalciferol (VITAMIN D3) 400 units CAPS Take 400 Units by mouth daily.    . cloNIDine (CATAPRES) 0.1 MG tablet Take 1 tablet (0.1 mg total) by mouth 2 (two) times daily. (Patient taking differently: Take 0.1-0.2 mg by mouth 2 (two) times daily. 0.1 mg in the morning & 0.2 mg in the evening) 60 tablet 0  . clopidogrel (PLAVIX) 75 MG tablet TAKE 1 TABLET BY MOUTH ONCE DAILY 30 tablet 3  . Cyanocobalamin (B-12) 5000 MCG CAPS Take 5,000 mcg by mouth daily.     . hydrochlorothiazide (HYDRODIURIL) 25 MG tablet Take 25 mg by  mouth daily.     . indomethacin (INDOCIN) 50 MG capsule Take 50 mg by mouth 2 (two) times daily as needed (for gout flares).    Marland Kitchen ketorolac (ACULAR) 0.4 % SOLN Place 1 drop into the left eye 4 (four) times daily.    . Multiple Minerals-Vitamins (LEG-GESIC PO) Take 1 tablet by mouth at bedtime. LEGATRIN PM    . naproxen sodium (ANAPROX) 220 MG tablet Take 440 mg by mouth daily.    . traMADol (ULTRAM) 50 MG tablet Take 1 tablet (50 mg total) by mouth every 6 (six) hours as needed. (Patient taking differently: Take 50 mg by mouth daily. ) 30 tablet 1  . tretinoin (RETIN-A) 0.05 % cream Apply 1 application topically every other day. EVERY OTHER NIGHT     No current facility-administered medications for this visit.         Past Medical History:  Diagnosis Date  . Cancer ([place])    skin cancer  . 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 Hypertension   . Sciatica   . Sciatica          Past Surgical History:  Procedure Laterality Date  . ABDOMINAL HYSTERECTOMY    . APPENDECTOMY    .  CATARACT EXTRACTION, BILATERAL    . CHOLECYSTECTOMY    . ENDARTERECTOMY FEMORAL Right 04/19/2017   Procedure: ENDARTERECTOMY COMMON FEMORAL ARTERY, PROFUNDA  FEMORIS ARTERY, and  SUPERFICIAL FEMORAL ARTERY;  Surgeon: Algernon Huxley, MD;  Location: [facility];  Service: Vascular;  Laterality: Right;  . HIP SURGERY Bilateral   . LOWER EXTREMITY ANGIOGRAM Right 04/19/2017   Procedure: LOWER EXTREMITY ANGIOGRAM WITH SUPERFICIAL FEMORAL ARTERY STENT PLACEMENT;  Surgeon: Algernon Huxley, MD;  Location: [facility];  Service: Vascular;  Laterality: Right;  . LOWER EXTREMITY ANGIOGRAPHY Right 04/18/2017   Procedure: Lower Extremity Angiography;  Surgeon: Algernon Huxley, MD;  Location: [place] [facility];  Service: Cardiovascular;  Laterality: Right;  . LOWER EXTREMITY ANGIOGRAPHY Left 07/12/2017   Procedure: Lower Extremity Angiography;   Surgeon: Algernon Huxley, MD;  Location: [place] [facility];  Service: Cardiovascular;  Laterality: Left;  . LOWER EXTREMITY INTERVENTION  07/12/2017   Procedure: Lower Extremity Intervention;  Surgeon: Algernon Huxley, MD;  Location: [place] [facility];  Service: Cardiovascular;;  . RENAL ANGIOGRAPHY N/A 06/21/2017   Procedure: Renal Angiography;  Surgeon: Algernon Huxley, MD;  Location: [place] [facility];  Service: Cardiovascular;  Laterality: N/A;  . SHOULDER SURGERY Bilateral   . TOTAL HIP ARTHROPLASTY Bilateral     Social History Social History   Tobacco Use  . Smoking status: Former Smoker    Types: Cigarettes  . Smokeless tobacco: Never Used  Substance Use Topics  . Alcohol use: No  . Drug use: No    Family History      Family History  Problem Relation Age of Onset  . Diabetes Father   . Heart disease Father   NO bleeding disorders, clotting disorders, or autoimmune disease      Allergies  Allergen Reactions  . Percocet [Oxycodone-Acetaminophen] Itching  . Percodan [Oxycodone-Aspirin] Itching     REVIEW OF SYSTEMS (Negative unless checked)  Constitutional: [] Weight loss  [] Fever  [] Chills Cardiac: [] Chest pain   [] Chest pressure   [] Palpitations   [] Shortness of breath when laying flat   [] Shortness of breath at rest   [] Shortness of breath with exertion. Vascular:  [] Pain in legs with walking   [] Pain in legs at rest   [] Pain in legs when laying flat   [x] Claudication   [x] Pain in feet when walking  [x] Pain in feet at rest  [] Pain in feet when laying flat   [] History of DVT   [] Phlebitis   [] Swelling in legs   [] Varicose veins   [] Non-healing ulcers Pulmonary:   [] Uses home oxygen   [] Productive cough   [] Hemoptysis   [] Wheeze  [] COPD   [] Asthma Neurologic:  [] Dizziness  [] Blackouts   [] Seizures   [] History of stroke   [] History of TIA  [] Aphasia   [] Temporary blindness   [] Dysphagia   [] Weakness or numbness in arms   [] Weakness or numbness in  legs Musculoskeletal:  [x] Arthritis   [] Joint swelling   [] Joint pain   [x] Low back pain Hematologic:  [] Easy bruising  [] Easy bleeding   [] Hypercoagulable state   [] Anemic  [] Hepatitis Gastrointestinal:  [] Blood in stool   [] Vomiting blood  [] Gastroesophageal reflux/heartburn   [] Difficulty swallowing. Genitourinary:  [x] Chronic kidney disease   [] Difficult urination  [] Frequent urination  [] Burning with urination   [] Blood in urine Skin:  [] Rashes   [] Ulcers   [] Wounds Psychological:  [] History of anxiety   []  History of major depression.      Physical Examination  BP Marland Kitchen)  179/74 (BP Location: Left Arm)   Pulse 74   Resp 16  Gen:  WD/WN, NAD Head: [place]/AT, + temporalis wasting. Ear/Nose/Throat: Hearing grossly intact, nares w/o erythema or drainage, trachea midline Eyes: Conjunctiva injected. Sclera non-icteric Neck: Supple.  No JVD.  Pulmonary:  Good air movement, no use of accessory muscles.  Cardiac: Irregular Vascular:  Vessel Right Left  Radial Palpable Palpable                          PT  1+ palpable  not palpable  DP  1+ palpable  not palpable   Gastrointestinal: soft, non-tender/non-distended.  Musculoskeletal: M/S 5/5 throughout.  No deformity or atrophy.  Left leg with poor capillary refill and is cool and ischemic appearing.  Neurologic function is intact Neurologic: Sensation grossly intact in extremities.  Symmetrical.  Speech is fluent.  Psychiatric: Judgment intact, Mood & affect appropriate for pt's clinical situation. Dermatologic: No rashes or ulcers noted.  No cellulitis or open wounds.       Labs No results found for this or any previous visit (from the past 2160 hour(s)).  Radiology No results found.    Assessment/Plan Carotid stenosis Her carotid duplex last month reveals stable 1-39% right ICA stenosis and stable 40-59% left ICA stenosis.  This has shown no progression from her study one year ago. Doing well.  No focal symptoms.   Continue current medical regimen and recheck in 1 year with duplex  Hyperlipidemia lipid control important in reducing the progression of atherosclerotic disease. Continue statin therapy   Renal artery stenosis (HCC) Renal arteries not well seen today.  Consideration for intervention for her renal artery stenosis will need to be given going forward, but with her new found profound ischemia this is a more pressing issue currently.  Essential hypertension blood pressure control important in reducing the progression of atherosclerotic disease. On appropriate oral medications.   Atherosclerosis of native arteries of extremity with rest pain ([place]) Her noninvasive studies today demonstrate what appears to be either an iliac or a distal aortic occlusion which is new.  Her renal artery stent on the right is not well seen nor is her left renal artery.  Her left ABI is undetectable with extremely poor waveforms in her right ABI is stable at 0.5 with fair waveforms.  This is a profound drop in her left leg ABI from several months ago. This is a critical and limb threatening situation.  Patient will be admitted to the hospital and taken emergently to angiography later today to see if revascularization is an option.  Discussed that limb loss is clearly a possibility, and we will do anything we can to avoid that.  We discussed the risks and benefits of the procedure.  I will get her admitted to the hospital this morning.    Leotis Pain, MD  11/09/2017 10:03 AM    This note was created with Dragon medical transcription system.  Any errors from dictation are purely unintentional

## 2017-11-09 NOTE — Patient Instructions (Signed)
Angiogram  An angiogram, also called angiography, is a procedure used to look at the blood vessels. In this procedure, dye is injected through a long, thin tube (catheter) into an artery. X-rays are then taken. The X-rays will show if there is a blockage or problem in a blood vessel.  Tell a health care provider about:   Any allergies you have, including allergies to shellfish or contrast dye.   All medicines you are taking, including vitamins, herbs, eye drops, creams, and over-the-counter medicines.   Any problems you or family members have had with anesthetic medicines.   Any blood disorders you have.   Any surgeries you have had.   Any previous kidney problems or failure you have had.   Any medical conditions you have.   Possibility of pregnancy, if this applies.  What are the risks?  Generally, an angiogram is a safe procedure. However, as with any procedure, problems can occur. Possible problems include:   Injury to the blood vessels, including rupture or bleeding.   Infection or bruising at the catheter site.   Allergic reaction to the dye or contrast used.   Kidney damage from the dye or contrast used.   Blood clots that can lead to a stroke or heart attack.    What happens before the procedure?   Do not eat or drink after midnight on the night before the procedure, or as directed by your health care provider.   Ask your health care provider if you may drink enough water to take any needed medicines the morning of the procedure.  What happens during the procedure?   You may be given a medicine to help you relax (sedative) before and during the procedure. This medicine is given through an IV access tube that is inserted into one of your veins.   The area where the catheter will be inserted will be washed and shaved. This is usually done in the groin but may be done in the fold of your arm (near your elbow) or in the wrist.   A medicine will be given to numb the area where the catheter will  be inserted (local anesthetic).   The catheter will be inserted with a guide wire into an artery. The catheter is guided by using a type of X-ray (fluoroscopy) to the blood vessel being examined.   Dye is then injected into the catheter, and X-rays are taken. The dye helps to show where any narrowing or blockages are located.  What happens after the procedure?   If the procedure is done through the leg, you will be kept in bed lying flat for several hours. You will be instructed to not bend or cross your legs.   The insertion site will be checked frequently.   The pulse in your feet or wrist will be checked frequently.   Additional blood tests, X-rays, and electrocardiography may be done.   You may need to stay in the hospital overnight for observation.  This information is not intended to replace advice given to you by your health care provider. Make sure you discuss any questions you have with your health care provider.  Document Released: 08/23/2005 Document Revised: 04/26/2016 Document Reviewed: 04/16/2013  Elsevier Interactive Patient Education  2017 [employer]

## 2017-11-10 LAB — CBC
HCT: 27.9 % — ABNORMAL LOW (ref 35.0–47.0)
HEMOGLOBIN: 9.5 g/dL — AB (ref 12.0–16.0)
MCH: 29.4 pg (ref 26.0–34.0)
MCHC: 34.1 g/dL (ref 32.0–36.0)
MCV: 86.4 fL (ref 80.0–100.0)
PLATELETS: 210 10*3/uL (ref 150–440)
RBC: 3.23 MIL/uL — AB (ref 3.80–5.20)
RDW: 14.8 % — ABNORMAL HIGH (ref 11.5–14.5)
WBC: 7.8 10*3/uL (ref 3.6–11.0)

## 2017-11-10 LAB — PROTIME-INR
INR: 0.97
Prothrombin Time: 12.8 seconds (ref 11.4–15.2)

## 2017-11-10 LAB — MAGNESIUM: MAGNESIUM: 1.9 mg/dL (ref 1.7–2.4)

## 2017-11-10 LAB — BASIC METABOLIC PANEL
ANION GAP: 8 (ref 5–15)
BUN: 32 mg/dL — ABNORMAL HIGH (ref 6–20)
CALCIUM: 8.3 mg/dL — AB (ref 8.9–10.3)
CO2: 25 mmol/L (ref 22–32)
CREATININE: 0.93 mg/dL (ref 0.44–1.00)
Chloride: 100 mmol/L — ABNORMAL LOW (ref 101–111)
GFR, EST NON AFRICAN AMERICAN: 54 mL/min — AB (ref >60)
Glucose, Bld: 94 mg/dL (ref 65–99)
Potassium: 3.4 mmol/L — ABNORMAL LOW (ref 3.5–5.1)
Sodium: 133 mmol/L — ABNORMAL LOW (ref 135–145)

## 2017-11-10 LAB — APTT: APTT: 32 s (ref 24–36)

## 2017-11-10 LAB — HEPARIN LEVEL (UNFRACTIONATED): HEPARIN UNFRACTIONATED: 0.58 [IU]/mL (ref 0.30–0.70)

## 2017-11-10 MED ORDER — HEPARIN BOLUS VIA INFUSION
2500.0000 [IU] | Freq: Once | INTRAVENOUS | Status: AC
  Administered 2017-11-10: 2500 [IU] via INTRAVENOUS
  Filled 2017-11-10: qty 2500

## 2017-11-10 MED ORDER — HEPARIN (PORCINE) IN NACL 100-0.45 UNIT/ML-% IJ SOLN
850.0000 [IU]/h | INTRAMUSCULAR | Status: DC
  Administered 2017-11-10 – 2017-11-13 (×3): 700 [IU]/h via INTRAVENOUS
  Filled 2017-11-10 (×3): qty 250

## 2017-11-10 NOTE — Progress Notes (Signed)
1 Day Post-Op   Subjective/Chief Complaint: Doing Ok. Complaints of Left foot burning. Current pain regimen helpful with some relief.   Objective: Vital signs in last 24 hours: Temp:  [97.3 F (36.3 C)-98.3 F (36.8 C)] 98.3 F (36.8 C) (12/15 0819) Pulse Rate:  [77-92] 79 (12/15 0819) Resp:  [12-24] 17 (12/15 0429) BP: (133-223)/(44-96) 172/49 (12/15 0819) SpO2:  [87 %-100 %] 95 % (12/15 0819) Weight:  [43.1 kg (95 lb)-43.7 kg (96 lb 4.8 oz)] 43.7 kg (96 lb 4.8 oz) (12/15 0436) Last BM Date: 11/08/17  Intake/Output from previous day: 12/14 0701 - 12/15 0700 In: 272 [P.O.:240; I.V.:32] Out: 600 [Urine:600] Intake/Output this shift: No intake/output data recorded.  General appearance: alert and no distress Cardio: regular rate and rhythm, S1, S2 normal, no murmur, click, rub or gallop Extremities: left: leg and foot warm, +motor, sensory  Lab Results:  Recent Labs    11/10/17 0429  WBC 7.8  HGB 9.5*  HCT 27.9*  PLT 210   BMET Recent Labs    11/09/17 1206 11/10/17 0429  NA  --  133*  K  --  3.4*  CL  --  100*  CO2  --  25  GLUCOSE  --  94  BUN 40* 32*  CREATININE 1.02* 0.93  CALCIUM  --  8.3*   PT/INR No results for input(s): LABPROT, INR in the last 72 hours. ABG No results for input(s): PHART, HCO3 in the last 72 hours.  Invalid input(s): PCO2, PO2  Studies/Results: No results found.  Anti-infectives: Anti-infectives (From admission, onward)   Start     Dose/Rate Route Frequency Ordered Stop   11/09/17 1200  ceFAZolin (ANCEF) IVPB 2g/100 mL premix     2 g 200 mL/hr over 30 Minutes Intravenous  Once 11/09/17 1156 11/09/17 1359      Assessment/Plan: s/p Procedure(s): LOWER EXTREMITY ANGIOGRAPHY (Left) LOWER EXTREMITY INTERVENTION   Will keep in house until Wednesday- plan for further operative intervention. Will begin Heparin gtt. D/C Agrastat. Hold Eliquis. Pain control. Monitor BP. Continue current regimen for now. OOB to chair as  tolerated.   LOS: 1 day    Jamesetta So A 11/10/2017

## 2017-11-10 NOTE — Progress Notes (Signed)
Foley catheter removed at 04:30. No concerns offered. Pt tolerated well.

## 2017-11-10 NOTE — Progress Notes (Addendum)
[place] [facility] for heparin  Indication: VTE Treatment  Allergies  Allergen Reactions  . Percocet [Oxycodone-Acetaminophen] Itching  . Percodan [Oxycodone-Aspirin] Itching    Patient Measurements: Height: 5\' 2"  (157.5 cm) Weight: 96 lb 4.8 oz (43.7 kg) IBW/kg (Calculated) : 50.1 Heparin Dosing Weight: 43 kg  Vital Signs: Temp: 98.3 F (36.8 C) (12/15 0819) Temp Source: Oral (12/15 0819) BP: 172/49 (12/15 0819) Pulse Rate: 79 (12/15 0819)  Labs: Recent Labs    11/09/17 1206 11/10/17 0429 11/10/17 1129  HGB  --  9.5*  --   HCT  --  27.9*  --   PLT  --  210  --   APTT  --   --  32  LABPROT  --   --  12.8  INR  --   --  0.97  CREATININE 1.02* 0.93  --     Estimated Creatinine Clearance: 29.4 mL/min (by C-G formula based on SCr of 0.93 mg/dL).   Medical History: Past Medical History:  Diagnosis Date  . Cancer ([place])    skin cancer  . 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 Hypertension   . Sciatica   . Sciatica     Medications:  Patient was not on any anticoagulants at home. Patient does take clopidogrel and aspirin at home so they are at a higher risk of bleeding.   Assessment: 83 yo female admitted with worsening symptoms of PAD. Patient is being followed by surgery and had a left lower extremity angiography on 12/15. Pharmacy has been consulted to dose and monitor heparin.    Goal of Therapy:  Heparin level 0.3-0.7 units/ml Monitor platelets by anticoagulation protocol: Yes   Plan:  Give 2500 units bolus x 1 Start heparin infusion at 700 units/hr Check anti-Xa level in 8 hours and daily while on heparin Continue to monitor H&H and platelets  Lendon Ka, PharmD Pharmacy Resident 11/10/2017,2:02 PM   12/15 0900 heparin level 0.58. Continue current regimen. Recheck heparin level and CBC with tomorrow AM labs.  Sim Boast, PharmD, BCPS  11/10/17 9:54  PM

## 2017-11-11 LAB — COMPREHENSIVE METABOLIC PANEL
ALK PHOS: 54 U/L (ref 38–126)
ALT: 6 U/L — ABNORMAL LOW (ref 14–54)
ANION GAP: 8 (ref 5–15)
AST: 29 U/L (ref 15–41)
Albumin: 2.9 g/dL — ABNORMAL LOW (ref 3.5–5.0)
BUN: 29 mg/dL — ABNORMAL HIGH (ref 6–20)
CALCIUM: 8.5 mg/dL — AB (ref 8.9–10.3)
CO2: 29 mmol/L (ref 22–32)
CREATININE: 1.1 mg/dL — AB (ref 0.44–1.00)
Chloride: 96 mmol/L — ABNORMAL LOW (ref 101–111)
GFR, EST AFRICAN AMERICAN: 51 mL/min — AB (ref >60)
GFR, EST NON AFRICAN AMERICAN: 44 mL/min — AB (ref >60)
Glucose, Bld: 125 mg/dL — ABNORMAL HIGH (ref 65–99)
Potassium: 4.2 mmol/L (ref 3.5–5.1)
SODIUM: 133 mmol/L — AB (ref 135–145)
Total Bilirubin: 0.2 mg/dL — ABNORMAL LOW (ref 0.3–1.2)
Total Protein: 5.9 g/dL — ABNORMAL LOW (ref 6.5–8.1)

## 2017-11-11 LAB — CBC
HCT: 26.5 % — ABNORMAL LOW (ref 35.0–47.0)
Hemoglobin: 8.9 g/dL — ABNORMAL LOW (ref 12.0–16.0)
MCH: 28.4 pg (ref 26.0–34.0)
MCHC: 33.4 g/dL (ref 32.0–36.0)
MCV: 84.9 fL (ref 80.0–100.0)
Platelets: 194 10*3/uL (ref 150–440)
RBC: 3.13 MIL/uL — ABNORMAL LOW (ref 3.80–5.20)
RDW: 14.9 % — AB (ref 11.5–14.5)
WBC: 7.3 10*3/uL (ref 3.6–11.0)

## 2017-11-11 LAB — HEPARIN LEVEL (UNFRACTIONATED): HEPARIN UNFRACTIONATED: 0.44 [IU]/mL (ref 0.30–0.70)

## 2017-11-11 MED ORDER — ENSURE ENLIVE PO LIQD
237.0000 mL | Freq: Two times a day (BID) | ORAL | Status: DC
  Administered 2017-11-11 – 2017-11-18 (×13): 237 mL via ORAL

## 2017-11-11 MED ORDER — POTASSIUM CHLORIDE 20 MEQ PO PACK
40.0000 meq | PACK | Freq: Once | ORAL | Status: AC
  Administered 2017-11-11: 40 meq via ORAL
  Filled 2017-11-11: qty 2

## 2017-11-11 MED ORDER — SODIUM CHLORIDE 0.9% FLUSH
10.0000 mL | Freq: Two times a day (BID) | INTRAVENOUS | Status: DC
  Administered 2017-11-11 – 2017-11-14 (×6): 10 mL via INTRAVENOUS
  Administered 2017-11-14: 20 mL via INTRAVENOUS
  Administered 2017-11-15: 10 mL via INTRAVENOUS

## 2017-11-11 NOTE — Progress Notes (Signed)
This RN drew labs from the patient's PICC line using sterile technique.  Lab sent up 1 green, 1 blue, and 1 lavender tube.  I only had orders for 1 blue and 1 lavender tube, but drew the extra green tube and placed a [employer]quest temporary label on it.  Paula from lab notified and stated extra tube would be held onto in case of add-on labs.

## 2017-11-11 NOTE — Progress Notes (Signed)
2 Days Post-Op   Subjective/Chief Complaint: Doing much better today. Resting comfortably. Pain controlled with current regimen. Otherwise without complaint.   Objective: Vital signs in last 24 hours: Temp:  [97.6 F (36.4 C)-98.7 F (37.1 C)] 97.6 F (36.4 C) (12/16 0715) Pulse Rate:  [78-86] 78 (12/16 0715) Resp:  [17-18] 17 (12/16 0715) BP: (139-171)/(41-54) 151/51 (12/16 0715) SpO2:  [91 %-96 %] 94 % (12/16 0715) Weight:  [42.8 kg (94 lb 6.4 oz)] 42.8 kg (94 lb 6.4 oz) (12/16 0420) Last BM Date: 11/09/17  Intake/Output from previous day: 12/15 0701 - 12/16 0700 In: 307.7 [P.O.:240; I.V.:67.7] Out: 1150 [Urine:1150] Intake/Output this shift: Total I/O In: 240 [P.O.:240] Out: 200 [Urine:200]  General appearance: alert and no distress Cardio: regular rate and rhythm, S1, S2 normal, no murmur, click, rub or gallop Extremities: warm, perfused, +motor,+sensory  Lab Results:  Recent Labs    11/10/17 0429 11/11/17 0527  WBC 7.8 7.3  HGB 9.5* 8.9*  HCT 27.9* 26.5*  PLT 210 194   BMET Recent Labs    11/09/17 1206 11/10/17 0429  NA  --  133*  K  --  3.4*  CL  --  100*  CO2  --  25  GLUCOSE  --  94  BUN 40* 32*  CREATININE 1.02* 0.93  CALCIUM  --  8.3*   PT/INR Recent Labs    11/10/17 1129  LABPROT 12.8  INR 0.97   ABG No results for input(s): PHART, HCO3 in the last 72 hours.  Invalid input(s): PCO2, PO2  Studies/Results: No results found.  Anti-infectives: Anti-infectives (From admission, onward)   Start     Dose/Rate Route Frequency Ordered Stop   11/09/17 1200  ceFAZolin (ANCEF) IVPB 2g/100 mL premix     2 g 200 mL/hr over 30 Minutes Intravenous  Once 11/09/17 1156 11/09/17 1359      Assessment/Plan: s/p Procedure(s): LOWER EXTREMITY ANGIOGRAPHY (Left) LOWER EXTREMITY INTERVENTION OOB to chair.  Ensure shakes to supplement nutrition. Continue Heparin gtt. Replace Potassium. Plan for femoral endarterectomy Wednesday.  LOS: 2 days     Evaristo Bury 11/11/2017

## 2017-11-11 NOTE — Progress Notes (Signed)
Initial Nutrition Assessment  DOCUMENTATION CODES:   Severe malnutrition in context of social or environmental circumstances, Underweight  INTERVENTION:  Continue Ensure Enlive po BID, each supplement provides 350 kcal and 20 grams of protein. Patient prefers chocolate.  Recommend daily MVI.  Encouraged adequate intake of calories and protein at meals. Encouraged intake of small, frequent meals throughout the day in setting of early satiety.  NUTRITION DIAGNOSIS:   Severe Malnutrition related to social / environmental circumstances(anorexia, early satiety) as evidenced by severe fat depletion, severe muscle depletion.  GOAL:   Patient will meet greater than or equal to 90% of their needs  MONITOR:   PO intake, Supplement acceptance, Labs, Weight trends, I & O's  REASON FOR ASSESSMENT:   Malnutrition Screening Tool    ASSESSMENT:   83 year old female with PMHx of sciatica, HTN, skin cancer, PAD who is admitted with ischemic left lower extremity now s/p angiography and intervention on 12/14. Plan is for femoral endarterectomy on 12/19.   Met with patient at bedside. She reports she has had anorexia (absence of hunger) and early satiety for the past 4-5 months. She endorses occasional constipation, but reports it is not affecting her intake. She denies any N/V, abdominal pain, diarrhea, or difficulty chewing/swallowing. She reports that her son makes breakfast for her and it is her best meal. She usually has 2 scrambled eggs with cheese, toast, and coffee. She may have a small lunch (1/2 sandwich) if she has anything. At night she eats ice cream. She has been drinking a chocolate protein shake, but she cannot remember what it is called.  Patient reports her UBW was 130 lbs and she would like to get back to that weight. Weight history in chart only goes back to 2017, but could not find a weight as high as 130 lbs.  Meal Completion: 90-100%  Medications reviewed and include:  hydrochlorothiazide.  Labs reviewed: On 12/15 Sodium 133, Potassium 3.4, Chloride 100, Bun 32.  NUTRITION - FOCUSED PHYSICAL EXAM:    Most Recent Value  Orbital Region  Severe depletion  Upper Arm Region  Severe depletion  Thoracic and Lumbar Region  Severe depletion  Buccal Region  Severe depletion  Temple Region  Severe depletion  Clavicle Bone Region  Severe depletion  Clavicle and Acromion Bone Region  Severe depletion  Scapular Bone Region  Severe depletion  Dorsal Hand  Severe depletion  Patellar Region  Severe depletion  Anterior Thigh Region  Severe depletion  Posterior Calf Region  Severe depletion  Edema (RD Assessment)  None  Hair  Reviewed  Eyes  Reviewed  Mouth  Reviewed  Skin  Reviewed [ecchymosis]  Nails  Reviewed     Diet Order:  Diet regular Room service appropriate? Yes; Fluid consistency: Thin  EDUCATION NEEDS:   No education needs have been identified at this time  Skin:  Skin Assessment: Reviewed RN Assessment(ecchymosis to bilateral arms)  Last BM:  11/09/2017  Height:   Ht Readings from Last 1 Encounters:  11/09/17 5' 2" (1.575 m)    Weight:   Wt Readings from Last 1 Encounters:  11/11/17 94 lb 6.4 oz (42.8 kg)    Ideal Body Weight:  50 kg  BMI:  Body mass index is 17.27 kg/m.  Estimated Nutritional Needs:   Kcal:  1285-1500 (30-35 kcal/kg)  Protein:  65-77 grams (1.5-1.8 grams/kg)  Fluid:  1.3-1.5 L/day (1 mL/kcal)  Willey Blade, MS, RD, LDN Office: [phone] Pager: [phone] After Hours/Weekend Pager: [phone]

## 2017-11-11 NOTE — Progress Notes (Signed)
[place] [facility] for heparin  Indication: VTE Treatment  Allergies  Allergen Reactions  . Percocet [Oxycodone-Acetaminophen] Itching  . Percodan [Oxycodone-Aspirin] Itching    Patient Measurements: Height: 5\' 2"  (157.5 cm) Weight: 94 lb 6.4 oz (42.8 kg) IBW/kg (Calculated) : 50.1 Heparin Dosing Weight: 43 kg  Vital Signs: Temp: 97.8 F (36.6 C) (12/16 0420) Temp Source: Oral (12/16 0420) BP: 157/54 (12/16 0420) Pulse Rate: 79 (12/16 0420)  Labs: Recent Labs    11/09/17 1206 11/10/17 0429 11/10/17 1129 11/10/17 2104 11/11/17 0527 11/11/17 0637  HGB  --  9.5*  --   --  8.9*  --   HCT  --  27.9*  --   --  26.5*  --   PLT  --  210  --   --  194  --   APTT  --   --  32  --   --   --   LABPROT  --   --  12.8  --   --   --   INR  --   --  0.97  --   --   --   HEPARINUNFRC  --   --   --  0.58  --  0.44  CREATININE 1.02* 0.93  --   --   --   --     Estimated Creatinine Clearance: 28.8 mL/min (by C-G formula based on SCr of 0.93 mg/dL).   Medical History: Past Medical History:  Diagnosis Date  . Cancer ([place])    skin cancer  . 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 Hypertension   . Sciatica   . Sciatica     Medications:  Patient was not on any anticoagulants at home. Patient does take clopidogrel and aspirin at home so they are at a higher risk of bleeding.   Assessment: 83 yo female admitted with worsening symptoms of PAD. Patient is being followed by surgery and had a left lower extremity angiography on 12/15. Pharmacy has been consulted to dose and monitor heparin.    Goal of Therapy:  Heparin level 0.3-0.7 units/ml Monitor platelets by anticoagulation protocol: Yes   Plan:  Give 2500 units bolus x 1 Start heparin infusion at 700 units/hr Check anti-Xa level in 8 hours and daily while on heparin Continue to monitor H&H and platelets  12/15 0900 heparin level 0.58.  Continue current regimen. Recheck heparin level and CBC with tomorrow AM labs.  12/16  HL @ 0637 = 0.44. Will continue current drip rate and check next level with am labs.  Chinita Greenland PharmD Clinical Pharmacist 11/11/2017

## 2017-11-11 NOTE — Plan of Care (Signed)
  Pain Managment: General experience of comfort will improve 11/11/2017 2236 - Not Progressing by Marylouise Stacks, RN Note Continues to complain of pain in heels.

## 2017-11-12 ENCOUNTER — Other Ambulatory Visit (INDEPENDENT_AMBULATORY_CARE_PROVIDER_SITE_OTHER): Payer: Self-pay | Admitting: Vascular Surgery

## 2017-11-12 ENCOUNTER — Other Ambulatory Visit: Payer: Self-pay

## 2017-11-12 ENCOUNTER — Encounter: Payer: Self-pay | Admitting: Vascular Surgery

## 2017-11-12 DIAGNOSIS — I70222 Atherosclerosis of native arteries of extremities with rest pain, left leg: Principal | ICD-10-CM

## 2017-11-12 LAB — CBC
HCT: 27.2 % — ABNORMAL LOW (ref 35.0–47.0)
Hemoglobin: 9 g/dL — ABNORMAL LOW (ref 12.0–16.0)
MCH: 28.4 pg (ref 26.0–34.0)
MCHC: 33 g/dL (ref 32.0–36.0)
MCV: 86.1 fL (ref 80.0–100.0)
PLATELETS: 202 10*3/uL (ref 150–440)
RBC: 3.16 MIL/uL — AB (ref 3.80–5.20)
RDW: 14.5 % (ref 11.5–14.5)
WBC: 8.2 10*3/uL (ref 3.6–11.0)

## 2017-11-12 LAB — COMPREHENSIVE METABOLIC PANEL
ALBUMIN: 2.7 g/dL — AB (ref 3.5–5.0)
ALT: 5 U/L — AB (ref 14–54)
AST: 21 U/L (ref 15–41)
Alkaline Phosphatase: 57 U/L (ref 38–126)
Anion gap: 8 (ref 5–15)
BUN: 33 mg/dL — AB (ref 6–20)
CHLORIDE: 99 mmol/L — AB (ref 101–111)
CO2: 26 mmol/L (ref 22–32)
CREATININE: 1.09 mg/dL — AB (ref 0.44–1.00)
Calcium: 8.7 mg/dL — ABNORMAL LOW (ref 8.9–10.3)
GFR calc Af Amer: 51 mL/min — ABNORMAL LOW (ref >60)
GFR, EST NON AFRICAN AMERICAN: 44 mL/min — AB (ref >60)
GLUCOSE: 103 mg/dL — AB (ref 65–99)
POTASSIUM: 4.1 mmol/L (ref 3.5–5.1)
SODIUM: 133 mmol/L — AB (ref 135–145)
Total Bilirubin: 0.4 mg/dL (ref 0.3–1.2)
Total Protein: 6.1 g/dL — ABNORMAL LOW (ref 6.5–8.1)

## 2017-11-12 LAB — HEPARIN LEVEL (UNFRACTIONATED): HEPARIN UNFRACTIONATED: 0.32 [IU]/mL (ref 0.30–0.70)

## 2017-11-12 NOTE — Care Management Note (Signed)
Case Management Note  Patient Details  Name: Dawn [name] MRN: [id number] Date of Birth: 1927/12/08  Subjective/Objective:                   RNCM met with patient and her son. She lives with son and states that she has declined in mobility significantly this year. She went from cane to walker however she is mostly limited to chair/bed. She does not have a wheelchair. She has never needed Home Health services. She states she has another procedure planned for Wednesday with vascular and hopeful this makes her mobility status better.  Action/Plan:  RNCM will follow.   Expected Discharge Date:                  Expected Discharge Plan:     In-House Referral:     Discharge planning Services  CM Consult  Post Acute Care Choice:  Home Health, Durable Medical Equipment Choice offered to:  Patient, Adult Children  DME Arranged:    DME Agency:     HH Arranged:    [place] Agency:     Status of Service:  In process, will continue to follow  If discussed at Long Length of Stay Meetings, dates discussed:    Additional Comments:  Marshell Garfinkel, RN 11/12/2017, 10:43 AM

## 2017-11-12 NOTE — Plan of Care (Signed)
  Not Progressing Activity: Risk for activity intolerance will decrease 11/12/2017 1115 - Not Progressing by Etheleen Nicks, RN Note On bedrest currently Pain Managment: General experience of comfort will improve 11/12/2017 1115 - Not Progressing by Etheleen Nicks, RN Note Continues to have pain in left foot, prn medications   Not Applicable Clinical Measurements: Will remain free from infection [id number] - Not Applicable by Etheleen Nicks, RN

## 2017-11-12 NOTE — Anesthesia Preprocedure Evaluation (Addendum)
Anesthesia Evaluation  Patient identified by MRN, date of birth, ID band Patient awake    Reviewed: Allergy & Precautions, H&P , NPO status , Patient's Chart, lab work & pertinent test results, reviewed documented beta blocker date and time   Airway Mallampati: II  TM Distance: >3 FB Neck ROM: full    Dental  (+) Upper Dentures, Partial Lower   Pulmonary neg pulmonary ROS, asthma , former smoker,    Pulmonary exam normal        Cardiovascular Exercise Tolerance: Poor hypertension, On Medications + Peripheral Vascular Disease  negative cardio ROS Normal cardiovascular exam Rhythm:regular Rate:Normal     Neuro/Psych  Neuromuscular disease negative neurological ROS  negative psych ROS   GI/Hepatic negative GI ROS, Neg liver ROS,   Endo/Other  negative endocrine ROS  Renal/GU Renal diseasenegative Renal ROS  negative genitourinary   Musculoskeletal   Abdominal   Peds  Hematology negative hematology ROS (+)   Anesthesia Other Findings Past Medical History: No date: Cancer ([place])     Comment:  skin cancer No date: Family history of adverse reaction to anesthesia     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 No date: Hypertension No date: Sciatica No date: Sciatica Past Surgical History: No date: ABDOMINAL HYSTERECTOMY No date: APPENDECTOMY No date: CATARACT EXTRACTION, BILATERAL No date: CHOLECYSTECTOMY 04/19/2017: ENDARTERECTOMY FEMORAL; Right     Comment:  Procedure: ENDARTERECTOMY COMMON FEMORAL ARTERY,               PROFUNDA  FEMORIS ARTERY, and  SUPERFICIAL FEMORAL               ARTERY;  Surgeon: Algernon Huxley, MD;  Location: [facility] ORS;               Service: Vascular;  Laterality: Right; No date: HIP SURGERY; Bilateral 04/19/2017: LOWER EXTREMITY ANGIOGRAM; Right     Comment:  Procedure: LOWER EXTREMITY ANGIOGRAM WITH SUPERFICIAL               FEMORAL ARTERY STENT PLACEMENT;  Surgeon: Algernon Huxley,                MD;  Location: [facility] ORS;  Service: Vascular;  Laterality:              Right; 04/18/2017: LOWER EXTREMITY ANGIOGRAPHY; Right     Comment:  Procedure: Lower Extremity Angiography;  Surgeon: Algernon Huxley, MD;  Location: [facility];  Service:               Cardiovascular;  Laterality: Right; 07/12/2017: LOWER EXTREMITY ANGIOGRAPHY; Left     Comment:  Procedure: Lower Extremity Angiography;  Surgeon: Algernon Huxley, MD;  Location: [facility];  Service:               Cardiovascular;  Laterality: Left; 11/09/2017: LOWER EXTREMITY ANGIOGRAPHY; Left     Comment:  Procedure: LOWER EXTREMITY ANGIOGRAPHY;  Surgeon: Algernon Huxley, MD;  Location: [facility];  Service:               Cardiovascular;  Laterality: Left; 07/12/2017: LOWER EXTREMITY INTERVENTION     Comment:  Procedure: Lower Extremity Intervention;  Surgeon: Lucky Cowboy,  Erskine Squibb, MD;  Location: [facility];  Service:               Cardiovascular;; 11/09/2017: LOWER EXTREMITY INTERVENTION     Comment:  Procedure: LOWER EXTREMITY INTERVENTION;  Surgeon: Algernon Huxley, MD;  Location: [facility];  Service:               Cardiovascular;; 06/21/2017: RENAL ANGIOGRAPHY; N/A     Comment:  Procedure: Renal Angiography;  Surgeon: Algernon Huxley,               MD;  Location: [facility];  Service:               Cardiovascular;  Laterality: N/A; No date: SHOULDER SURGERY; Bilateral No date: TOTAL HIP ARTHROPLASTY; Bilateral BMI    Body Mass Index:  17.03 kg/m     Reproductive/Obstetrics negative OB ROS                                                             Anesthesia Evaluation  Patient identified by MRN, date of birth, ID band Patient awake    Reviewed: Allergy & Precautions, H&P , NPO status , Patient's Chart, lab work & pertinent test results, reviewed documented beta blocker date and time    History of Anesthesia Complications (+) Family history of anesthesia reaction and history of anesthetic complications  Airway Mallampati: II  TM Distance: >3 FB Neck ROM: full    Dental  (+) Edentulous Upper, Upper Dentures, Partial Lower, Dental Advidsory Given   Pulmonary neg shortness of breath, asthma , neg sleep apnea, neg COPD, neg recent URI, former smoker,           Cardiovascular Exercise Tolerance: Good hypertension, On Medications (-) angina(-) CAD, (-) Past MI, (-) Cardiac Stents and (-) CABG (-) dysrhythmias (-) Valvular Problems/Murmurs     Neuro/Psych neg Seizures  Neuromuscular disease negative psych ROS   GI/Hepatic negative GI ROS, Neg liver ROS,   Endo/Other  negative endocrine ROS  Renal/GU negative Renal ROS  negative genitourinary   Musculoskeletal   Abdominal   Peds  Hematology negative hematology ROS (+)   Anesthesia Other Findings Past Medical History: No date: Family history of adverse reaction to anesthes*     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 No date: Hypertension No date: Sciatica No date: Sciatica   Reproductive/Obstetrics negative OB ROS                             Anesthesia Physical Anesthesia Plan  ASA: II  Anesthesia Plan: General   Post-op Pain Management:    Induction:   Airway Management Planned:   Additional Equipment:   Intra-op Plan:   Post-operative Plan:   Informed Consent: I have reviewed the patients History and Physical, chart, labs and discussed the procedure including the risks, benefits and alternatives for the proposed anesthesia with the patient or authorized representative who has indicated his/her understanding and acceptance.   Dental Advisory Given  Plan Discussed with: Anesthesiologist, CRNA and Surgeon  Anesthesia Plan Comments:         Anesthesia Quick Evaluation  Anesthesia Physical Anesthesia Plan  ASA: III  Anesthesia  Plan: General ETT   Post-op Pain Management:    Induction: Intravenous  PONV Risk Score and Plan:   Airway Management Planned: Oral ETT  Additional Equipment:   Intra-op Plan:   Post-operative Plan: Extubation in OR  Informed Consent: I have reviewed the patients History and Physical, chart, labs and discussed the procedure including the risks, benefits and alternatives for the proposed anesthesia with the patient or authorized representative who has indicated his/her understanding and acceptance.   Dental Advisory Given  Plan Discussed with: CRNA  Anesthesia Plan Comments:        Anesthesia Quick Evaluation

## 2017-11-12 NOTE — Progress Notes (Signed)
[place] for heparin  Indication: VTE Treatment  Allergies  Allergen Reactions  . Percocet [Oxycodone-Acetaminophen] Itching  . Percodan [Oxycodone-Aspirin] Itching    Patient Measurements: Height: 5\' 2"  (157.5 cm) Weight: 93 lb 1.6 oz (42.2 kg) IBW/kg (Calculated) : 50.1 Heparin Dosing Weight: 43 kg  Vital Signs: Temp: 98.1 F (36.7 C) (12/17 0440) Temp Source: Oral (12/17 0440) BP: 168/57 (12/17 0542) Pulse Rate: 83 (12/17 0440)  Labs: Recent Labs    11/10/17 0429 11/10/17 1129 11/10/17 2104 11/11/17 0527 11/11/17 0637 11/11/17 1438 11/12/17 0502  HGB 9.5*  --   --  8.9*  --   --  9.0*  HCT 27.9*  --   --  26.5*  --   --  27.2*  PLT 210  --   --  194  --   --  202  APTT  --  32  --   --   --   --   --   LABPROT  --  12.8  --   --   --   --   --   INR  --  0.97  --   --   --   --   --   HEPARINUNFRC  --   --  0.58  --  0.44  --  0.32  CREATININE 0.93  --   --   --   --  1.10* 1.09*    Estimated Creatinine Clearance: 24.2 mL/min (A) (by C-G formula based on SCr of 1.09 mg/dL (H)).   Medical History: Past Medical History:  Diagnosis Date  . Cancer ([place])    skin cancer  . 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 Hypertension   . Sciatica   . Sciatica     Medications:  Patient was not on any anticoagulants at home. Patient does take clopidogrel and aspirin at home so they are at a higher risk of bleeding.   Assessment: 83 yo female admitted with worsening symptoms of PAD. Patient is being followed by surgery and had a left lower extremity angiography on 12/15. Pharmacy has been consulted to dose and monitor heparin.    Goal of Therapy:  Heparin level 0.3-0.7 units/ml Monitor platelets by anticoagulation protocol: Yes   Plan:  Give 2500 units bolus x 1 Start heparin infusion at 700 units/hr Check anti-Xa level in 8 hours and daily while on heparin Continue to  monitor H&H and platelets  12/15 0900 heparin level 0.58. Continue current regimen. Recheck heparin level and CBC with tomorrow AM labs.  12/16  HL @ 0637 = 0.44. Will continue current drip rate and check next level with am labs.  12/17 AM heparin level 0.32. Continue current regimen. Recheck heparin level and CBC with tomorrow AM labs.  Sim Boast, PharmD, BCPS  11/12/17 6:01 AM

## 2017-11-12 NOTE — Care Management Important Message (Signed)
Important Message  Patient Details  Name: Dawn [name] MRN: [id number] Date of Birth: [birth year]   Medicare Important Message Given:  Yes    Marshell Garfinkel, RN 11/12/2017, 8:44 AM

## 2017-11-12 NOTE — Progress Notes (Signed)
[facility]  Daily Progress Note   Subjective  - 3 Days Post-Op  Sleeping this afternoon.  No major events overnight.  Feels kind of tired but her leg is doing okay  Objective Vitals:   11/11/17 1928 11/12/17 0440 11/12/17 0542 11/12/17 0759  BP: (!) 147/77 (!) 176/50 (!) 168/57 (!) 161/46  Pulse: 88 83  81  Resp: 18 18  17   Temp: 98.6 F (37 C) 98.1 F (36.7 C)  98.1 F (36.7 C)  TempSrc: Oral Oral  Oral  SpO2: 92% 92%  95%  Weight:  42.2 kg (93 lb 1.6 oz)    Height:        Intake/Output Summary (Last 24 hours) at 11/12/2017 1621 Last data filed at 11/12/2017 1526 Gross per 24 hour  Intake 491.88 ml  Output 1800 ml  Net -1308.12 ml    PULM  CTAB CV  RRR VASC  left foot is warm with a 1+ pedal pulse.  Laboratory CBC    Component Value Date/Time   WBC 8.2 11/12/2017 0502   HGB 9.0 (L) 11/12/2017 0502   HCT 27.2 (L) 11/12/2017 0502   PLT 202 11/12/2017 0502    BMET    Component Value Date/Time   NA 133 (L) 11/12/2017 0502   K 4.1 11/12/2017 0502   CL 99 (L) 11/12/2017 0502   CO2 26 11/12/2017 0502   GLUCOSE 103 (H) 11/12/2017 0502   BUN 33 (H) 11/12/2017 0502   CREATININE 1.09 (H) 11/12/2017 0502   CREATININE 0.92 03/13/2012 0805   CALCIUM 8.7 (L) 11/12/2017 0502   GFRNONAA 44 (L) 11/12/2017 0502   GFRNONAA 58 (L) 03/13/2012 0805   GFRAA 51 (L) 11/12/2017 0502   GFRAA >60 03/13/2012 0805    Assessment/Planning: POD #3 s/p left SFA, popliteal, and tibial intervention   Still with significant disease in the profunda femoris artery and some disease in the common femoral artery as well and would benefit from a femoral endarterectomy both to maintain inflow and to avoid profound ischemia if her SFA and popliteal stents occlude  She is agreeable and this has been scheduled for Wednesday.  Continue heparin drip  Will obtain anesthesia consult for evaluation prior to surgery    Leotis Pain  11/12/2017, 4:21 PM

## 2017-11-13 LAB — CBC WITH DIFFERENTIAL/PLATELET
Basophils Absolute: 0 10*3/uL (ref 0–0.1)
Basophils Relative: 1 %
EOS PCT: 1 %
Eosinophils Absolute: 0.1 10*3/uL (ref 0–0.7)
HCT: 29.4 % — ABNORMAL LOW (ref 35.0–47.0)
Hemoglobin: 9.6 g/dL — ABNORMAL LOW (ref 12.0–16.0)
LYMPHS ABS: 1 10*3/uL (ref 1.0–3.6)
LYMPHS PCT: 13 %
MCH: 28.3 pg (ref 26.0–34.0)
MCHC: 32.6 g/dL (ref 32.0–36.0)
MCV: 86.9 fL (ref 80.0–100.0)
MONO ABS: 0.7 10*3/uL (ref 0.2–0.9)
MONOS PCT: 9 %
Neutro Abs: 6 10*3/uL (ref 1.4–6.5)
Neutrophils Relative %: 76 %
PLATELETS: 241 10*3/uL (ref 150–440)
RBC: 3.38 MIL/uL — AB (ref 3.80–5.20)
RDW: 14.3 % (ref 11.5–14.5)
WBC: 7.9 10*3/uL (ref 3.6–11.0)

## 2017-11-13 LAB — CBC
HEMATOCRIT: 27.9 % — AB (ref 35.0–47.0)
Hemoglobin: 9.3 g/dL — ABNORMAL LOW (ref 12.0–16.0)
MCH: 28.5 pg (ref 26.0–34.0)
MCHC: 33.2 g/dL (ref 32.0–36.0)
MCV: 85.8 fL (ref 80.0–100.0)
PLATELETS: 233 10*3/uL (ref 150–440)
RBC: 3.25 MIL/uL — ABNORMAL LOW (ref 3.80–5.20)
RDW: 14.6 % — ABNORMAL HIGH (ref 11.5–14.5)
WBC: 8.1 10*3/uL (ref 3.6–11.0)

## 2017-11-13 LAB — TYPE AND SCREEN
ABO/RH(D): A POS
ANTIBODY SCREEN: NEGATIVE

## 2017-11-13 LAB — HEPARIN LEVEL (UNFRACTIONATED)
Heparin Unfractionated: <0.1 IU/mL — ABNORMAL LOW (ref 0.30–0.70)
Heparin Unfractionated: 0.33 IU/mL (ref 0.30–0.70)
Heparin Unfractionated: 0.3 IU/mL (ref 0.30–0.70)

## 2017-11-13 LAB — COMPREHENSIVE METABOLIC PANEL
ALK PHOS: 56 U/L (ref 38–126)
ALT: 6 U/L — ABNORMAL LOW (ref 14–54)
ANION GAP: 8 (ref 5–15)
AST: 22 U/L (ref 15–41)
Albumin: 2.9 g/dL — ABNORMAL LOW (ref 3.5–5.0)
BILIRUBIN TOTAL: 0.4 mg/dL (ref 0.3–1.2)
BUN: 37 mg/dL — AB (ref 6–20)
CALCIUM: 8.9 mg/dL (ref 8.9–10.3)
CO2: 26 mmol/L (ref 22–32)
CREATININE: 1.05 mg/dL — AB (ref 0.44–1.00)
Chloride: 97 mmol/L — ABNORMAL LOW (ref 101–111)
GFR, EST AFRICAN AMERICAN: 54 mL/min — AB (ref >60)
GFR, EST NON AFRICAN AMERICAN: 46 mL/min — AB (ref >60)
Glucose, Bld: 106 mg/dL — ABNORMAL HIGH (ref 65–99)
Potassium: 4.4 mmol/L (ref 3.5–5.1)
Sodium: 131 mmol/L — ABNORMAL LOW (ref 135–145)
Total Protein: 6.3 g/dL — ABNORMAL LOW (ref 6.5–8.1)

## 2017-11-13 LAB — BASIC METABOLIC PANEL
Anion gap: 9 (ref 5–15)
BUN: 37 mg/dL — AB (ref 6–20)
CHLORIDE: 95 mmol/L — AB (ref 101–111)
CO2: 25 mmol/L (ref 22–32)
Calcium: 8.6 mg/dL — ABNORMAL LOW (ref 8.9–10.3)
Creatinine, Ser: 1.04 mg/dL — ABNORMAL HIGH (ref 0.44–1.00)
GFR calc Af Amer: 54 mL/min — ABNORMAL LOW (ref >60)
GFR calc non Af Amer: 47 mL/min — ABNORMAL LOW (ref >60)
GLUCOSE: 133 mg/dL — AB (ref 65–99)
POTASSIUM: 4.1 mmol/L (ref 3.5–5.1)
Sodium: 129 mmol/L — ABNORMAL LOW (ref 135–145)

## 2017-11-13 LAB — PROTIME-INR
INR: 0.94
Prothrombin Time: 12.5 seconds (ref 11.4–15.2)

## 2017-11-13 LAB — APTT: aPTT: 85 seconds — ABNORMAL HIGH (ref 24–36)

## 2017-11-13 MED ORDER — SORBITOL 70 % SOLN
30.0000 mL | Freq: Three times a day (TID) | Status: DC | PRN
  Filled 2017-11-13: qty 30

## 2017-11-13 MED ORDER — DOCUSATE SODIUM 100 MG PO CAPS
100.0000 mg | ORAL_CAPSULE | Freq: Two times a day (BID) | ORAL | Status: DC
  Administered 2017-11-13 – 2017-11-22 (×15): 100 mg via ORAL
  Filled 2017-11-13 (×16): qty 1

## 2017-11-13 MED ORDER — SODIUM CHLORIDE 0.9% FLUSH: 10.0000 mL | INTRAVENOUS | Status: DC | PRN

## 2017-11-13 MED ORDER — CEFAZOLIN SODIUM-DEXTROSE 2-4 GM/100ML-% IV SOLN
2.0000 g | INTRAVENOUS | Status: DC
  Filled 2017-11-13: qty 100

## 2017-11-13 MED ORDER — HEPARIN BOLUS VIA INFUSION
1400.0000 [IU] | Freq: Once | INTRAVENOUS | Status: AC
  Administered 2017-11-13: 1400 [IU] via INTRAVENOUS
  Filled 2017-11-13: qty 1400

## 2017-11-13 NOTE — Progress Notes (Signed)
[facility]  Daily Progress Note   Subjective  - 4 Days Post-Op  Had an episode where her legs went to sleep last night after she fell asleep on the commode.  This was very bothersome to her.  She was very anxious this morning.  Her foot remains warm and not particularly painful.  Objective Vitals:   11/12/17 2002 11/13/17 0425 11/13/17 0725 11/13/17 1522  BP: (!) 156/53 (!) 184/58 (!) 152/50 (!) 168/53  Pulse: 93 93 91 86  Resp: 17 17 18 17   Temp: 98.6 F (37 C) 98.4 F (36.9 C) 98.6 F (37 C) 97.6 F (36.4 C)  TempSrc: Oral Oral Oral   SpO2: 92% 93% 94% 94%  Weight:  45.5 kg (100 lb 4.8 oz)    Height:  5\' 2"  (1.575 m)      Intake/Output Summary (Last 24 hours) at 11/13/2017 1546 Last data filed at 11/13/2017 1432 Gross per 24 hour  Intake 626.97 ml  Output 500 ml  Net 126.97 ml    PULM  CTAB CV  RRR VASC  1+ pedal pulse with good capillary refill Laboratory CBC    Component Value Date/Time   WBC 7.9 11/13/2017 1136   HGB 9.6 (L) 11/13/2017 1136   HCT 29.4 (L) 11/13/2017 1136   PLT 241 11/13/2017 1136    BMET    Component Value Date/Time   NA 129 (L) 11/13/2017 1136   K 4.1 11/13/2017 1136   CL 95 (L) 11/13/2017 1136   CO2 25 11/13/2017 1136   GLUCOSE 133 (H) 11/13/2017 1136   BUN 37 (H) 11/13/2017 1136   CREATININE 1.04 (H) 11/13/2017 1136   CREATININE 0.92 03/13/2012 0805   CALCIUM 8.6 (L) 11/13/2017 1136   GFRNONAA 47 (L) 11/13/2017 1136   GFRNONAA 58 (L) 03/13/2012 0805   GFRAA 54 (L) 11/13/2017 1136   GFRAA >60 03/13/2012 0805    Assessment/Planning: POD #4 s/p left SFA, popliteal, and tibial intervention   Residual profunda femoris disease and common femoral disease should have an endarterectomy and is agreeable to proceed. This has been scheduled for tomorrow.  Risks and benefits discussed    Dawn [name]  11/13/2017, 3:46 PM

## 2017-11-13 NOTE — Progress Notes (Signed)
[place] for heparin  Indication: VTE Treatment  Allergies  Allergen Reactions  . Percocet [Oxycodone-Acetaminophen] Itching  . Percodan [Oxycodone-Aspirin] Itching    Patient Measurements: Height: 5\' 2"  (157.5 cm) Weight: 100 lb 4.8 oz (45.5 kg) IBW/kg (Calculated) : 50.1 Heparin Dosing Weight: 43 kg  Vital Signs: Temp: 98.4 F (36.9 C) (12/18 0425) Temp Source: Oral (12/18 0425) BP: 184/58 (12/18 0425) Pulse Rate: 93 (12/18 0425)  Labs: Recent Labs    11/10/17 1129  11/11/17 0527 11/11/17 0637 11/11/17 1438 11/12/17 0502 11/13/17 0416  HGB  --    < > 8.9*  --   --  9.0* 9.3*  HCT  --   --  26.5*  --   --  27.2* 27.9*  PLT  --   --  194  --   --  202 233  APTT 32  --   --   --   --   --   --   LABPROT 12.8  --   --   --   --   --   --   INR 0.97  --   --   --   --   --   --   HEPARINUNFRC  --    < >  --  0.44  --  0.32 <0.10*  CREATININE  --   --   --   --  1.10* 1.09* 1.05*   < > = values in this interval not displayed.    Estimated Creatinine Clearance: 27.1 mL/min (A) (by C-G formula based on SCr of 1.05 mg/dL (H)).   Medical History: Past Medical History:  Diagnosis Date  . Cancer ([place])    skin cancer  . 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 Hypertension   . Sciatica   . Sciatica     Medications:  Patient was not on any anticoagulants at home. Patient does take clopidogrel and aspirin at home so they are at a higher risk of bleeding.   Assessment: 83 yo female admitted with worsening symptoms of PAD. Patient is being followed by surgery and had a left lower extremity angiography on 12/15. Pharmacy has been consulted to dose and monitor heparin.    Goal of Therapy:  Heparin level 0.3-0.7 units/ml Monitor platelets by anticoagulation protocol: Yes   Plan:  Give 2500 units bolus x 1 Start heparin infusion at 700 units/hr Check anti-Xa level in 8 hours  and daily while on heparin Continue to monitor H&H and platelets  12/15 0900 heparin level 0.58. Continue current regimen. Recheck heparin level and CBC with tomorrow AM labs.  12/16  HL @ 0637 = 0.44. Will continue current drip rate and check next level with am labs.  12/17 AM heparin level 0.32. Continue current regimen. Recheck heparin level and CBC with tomorrow AM labs.  12/18 @ 0400 HL < 0.10 subtherapeutic. Will rebolus w/ heparin 1400 units IV x 1 and increase rate to 800 units/hr and will recheck HL @ 1500. CBC stable  Tobie Lords, PharmD, BCPS Clinical Pharmacist 11/13/2017

## 2017-11-13 NOTE — Progress Notes (Signed)
[place] for heparin  Indication: VTE Treatment  Allergies  Allergen Reactions  . Percocet [Oxycodone-Acetaminophen] Itching  . Percodan [Oxycodone-Aspirin] Itching    Patient Measurements: Height: 5\' 2"  (157.5 cm) Weight: 100 lb 4.8 oz (45.5 kg) IBW/kg (Calculated) : 50.1 Heparin Dosing Weight: 45 kg  Vital Signs: Temp: 97.6 F (36.4 C) (12/18 1522) Temp Source: Oral (12/18 0725) BP: 168/53 (12/18 1522) Pulse Rate: 86 (12/18 1522)  Labs: Recent Labs    11/12/17 0502 11/13/17 0416 11/13/17 1136 11/13/17 1512  HGB 9.0* 9.3* 9.6*  --   HCT 27.2* 27.9* 29.4*  --   PLT 202 233 241  --   APTT  --   --  85*  --   LABPROT  --   --  12.5  --   INR  --   --  0.94  --   HEPARINUNFRC 0.32 <0.10*  --  0.33  CREATININE 1.09* 1.05* 1.04*  --     Estimated Creatinine Clearance: 27.4 mL/min (A) (by C-G formula based on SCr of 1.04 mg/dL (H)).   Medical History: Past Medical History:  Diagnosis Date  . Cancer ([place])    skin cancer  . 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 Hypertension   . Sciatica   . Sciatica     Medications:  Patient was not on any anticoagulants at home. Patient does take clopidogrel and aspirin at home so they are at a higher risk of bleeding.   Assessment: 83 yo female admitted with worsening symptoms of PAD. Patient is being followed by surgery and had a left lower extremity angiography on 12/15. Pharmacy has been consulted to dose and monitor heparin.    Goal of Therapy:  Heparin level 0.3-0.7 units/ml Monitor platelets by anticoagulation protocol: Yes   Plan:  HL = 0.33 is therapeutic. Continue heparin infusion at current rate of 800 units/hr. Ordered confirmatory HL in 8 hours and CBC with AM labs tomorrow.  Lenis Noon, PharmD, BCPS Clinical Pharmacist 11/13/2017

## 2017-11-13 NOTE — Progress Notes (Signed)
[place] for heparin  Indication: VTE Treatment  Allergies  Allergen Reactions  . Percocet [Oxycodone-Acetaminophen] Itching  . Percodan [Oxycodone-Aspirin] Itching    Patient Measurements: Height: 5\' 2"  (157.5 cm) Weight: 100 lb 4.8 oz (45.5 kg) IBW/kg (Calculated) : 50.1 Heparin Dosing Weight: 45 kg  Vital Signs: Temp: 99.6 F (37.6 C) (12/18 1957) Temp Source: Oral (12/18 1957) BP: 154/53 (12/18 1957) Pulse Rate: 96 (12/18 1957)  Labs: Recent Labs    11/12/17 0502 11/13/17 0416 11/13/17 1136 11/13/17 1512 11/13/17 2311  HGB 9.0* 9.3* 9.6*  --   --   HCT 27.2* 27.9* 29.4*  --   --   PLT 202 233 241  --   --   APTT  --   --  85*  --   --   LABPROT  --   --  12.5  --   --   INR  --   --  0.94  --   --   HEPARINUNFRC 0.32 <0.10*  --  0.33 0.30  CREATININE 1.09* 1.05* 1.04*  --   --     Estimated Creatinine Clearance: 27.4 mL/min (A) (by C-G formula based on SCr of 1.04 mg/dL (H)).   Medical History: Past Medical History:  Diagnosis Date  . Cancer ([place])    skin cancer  . 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 Hypertension   . Sciatica   . Sciatica     Medications:  Patient was not on any anticoagulants at home. Patient does take clopidogrel and aspirin at home so they are at a higher risk of bleeding.   Assessment: 83 yo female admitted with worsening symptoms of PAD. Patient is being followed by surgery and had a left lower extremity angiography on 12/15. Pharmacy has been consulted to dose and monitor heparin.    Goal of Therapy:  Heparin level 0.3-0.7 units/ml Monitor platelets by anticoagulation protocol: Yes   Plan:  HL = 0.33 is therapeutic. Continue heparin infusion at current rate of 800 units/hr. Ordered confirmatory HL in 8 hours and CBC with AM labs tomorrow.  12/18 @ 2300 HL 0.33 therapeutic, but trending down. Will increase rate to 850 units/hr  and will recheck HL @ 0700, CBC stable will monitor w/ am labs.  Tobie Lords, PharmD, BCPS Clinical Pharmacist 11/13/2017

## 2017-11-14 ENCOUNTER — Inpatient Hospital Stay: Payer: Medicare Other | Admitting: Anesthesiology

## 2017-11-14 ENCOUNTER — Encounter: Payer: Self-pay | Admitting: Anesthesiology

## 2017-11-14 ENCOUNTER — Encounter
Admission: AD | Disposition: A | Discharge status: Skilled Nursing Facility | Payer: Self-pay | Source: Ambulatory Visit | Attending: Vascular Surgery

## 2017-11-14 DIAGNOSIS — E43 Unspecified severe protein-calorie malnutrition: Secondary | ICD-10-CM

## 2017-11-14 DIAGNOSIS — I70222 Atherosclerosis of native arteries of extremities with rest pain, left leg: Secondary | ICD-10-CM

## 2017-11-14 HISTORY — PX: ENDARTERECTOMY FEMORAL: SHX5804

## 2017-11-14 LAB — CBC
HEMATOCRIT: 26.9 % — AB (ref 35.0–47.0)
HEMOGLOBIN: 9 g/dL — AB (ref 12.0–16.0)
MCH: 28.7 pg (ref 26.0–34.0)
MCHC: 33.6 g/dL (ref 32.0–36.0)
MCV: 85.4 fL (ref 80.0–100.0)
Platelets: 266 10*3/uL (ref 150–440)
RBC: 3.15 MIL/uL — ABNORMAL LOW (ref 3.80–5.20)
RDW: 14.5 % (ref 11.5–14.5)
WBC: 7.7 10*3/uL (ref 3.6–11.0)

## 2017-11-14 LAB — HEPARIN LEVEL (UNFRACTIONATED): Heparin Unfractionated: 0.29 IU/mL — ABNORMAL LOW (ref 0.30–0.70)

## 2017-11-14 SURGERY — ENDARTERECTOMY, FEMORAL
Anesthesia: General | Laterality: Left

## 2017-11-14 SURGERY — ENDARTERECTOMY, FEMORAL
Anesthesia: General | Laterality: Left | Wound class: Clean

## 2017-11-14 MED ORDER — LABETALOL HCL 5 MG/ML IV SOLN
INTRAVENOUS | Status: DC | PRN
  Administered 2017-11-14: 5 mg via INTRAVENOUS

## 2017-11-14 MED ORDER — PHENYLEPHRINE HCL 10 MG/ML IJ SOLN
INTRAMUSCULAR | Status: DC | PRN
  Administered 2017-11-14 (×2): 50 ug via INTRAVENOUS

## 2017-11-14 MED ORDER — CLOPIDOGREL BISULFATE 75 MG PO TABS
75.0000 mg | ORAL_TABLET | Freq: Every day | ORAL | Status: DC
  Administered 2017-11-14 – 2017-11-22 (×9): 75 mg via ORAL
  Filled 2017-11-14 (×9): qty 1

## 2017-11-14 MED ORDER — LABETALOL HCL 5 MG/ML IV SOLN
INTRAVENOUS | Status: AC
  Filled 2017-11-14: qty 4

## 2017-11-14 MED ORDER — TRAMADOL HCL 50 MG PO TABS
50.0000 mg | ORAL_TABLET | Freq: Four times a day (QID) | ORAL | Status: DC | PRN
  Administered 2017-11-14 – 2017-11-21 (×7): 50 mg via ORAL
  Filled 2017-11-14 (×7): qty 1

## 2017-11-14 MED ORDER — FENTANYL CITRATE (PF) 100 MCG/2ML IJ SOLN
INTRAMUSCULAR | Status: DC | PRN
  Administered 2017-11-14 (×4): 50 ug via INTRAVENOUS

## 2017-11-14 MED ORDER — FENTANYL CITRATE (PF) 100 MCG/2ML IJ SOLN
INTRAMUSCULAR | Status: AC
  Filled 2017-11-14: qty 2

## 2017-11-14 MED ORDER — ASPIRIN EC 81 MG PO TBEC
81.0000 mg | DELAYED_RELEASE_TABLET | Freq: Every day | ORAL | Status: DC
  Administered 2017-11-14 – 2017-11-22 (×9): 81 mg via ORAL
  Filled 2017-11-14 (×8): qty 1

## 2017-11-14 MED ORDER — ROCURONIUM BROMIDE 100 MG/10ML IV SOLN
INTRAVENOUS | Status: DC | PRN
  Administered 2017-11-14: 5 mg via INTRAVENOUS
  Administered 2017-11-14: 10 mg via INTRAVENOUS
  Administered 2017-11-14: 20 mg via INTRAVENOUS
  Administered 2017-11-14: 10 mg via INTRAVENOUS
  Administered 2017-11-14: 5 mg via INTRAVENOUS

## 2017-11-14 MED ORDER — HEPARIN SODIUM (PORCINE) 1000 UNIT/ML IJ SOLN
INTRAMUSCULAR | Status: DC | PRN
  Administered 2017-11-14: 4000 [IU] via INTRAVENOUS

## 2017-11-14 MED ORDER — CEFAZOLIN SODIUM 10 G IJ SOLR
2.0000 g | INTRAMUSCULAR | Status: AC
  Administered 2017-11-14: 2 g via INTRAVENOUS
  Filled 2017-11-14: qty 2000

## 2017-11-14 MED ORDER — CHLORHEXIDINE GLUCONATE CLOTH 2 % EX PADS
6.0000 | MEDICATED_PAD | Freq: Once | CUTANEOUS | Status: AC
  Administered 2017-11-14: 6 via TOPICAL

## 2017-11-14 MED ORDER — CLONIDINE HCL 0.1 MG PO TABS
0.2000 mg | ORAL_TABLET | Freq: Every day | ORAL | Status: DC
  Administered 2017-11-14 – 2017-11-18 (×5): 0.2 mg via ORAL
  Filled 2017-11-14 (×5): qty 2

## 2017-11-14 MED ORDER — LIDOCAINE HCL (CARDIAC) 20 MG/ML IV SOLN
INTRAVENOUS | Status: DC | PRN
  Administered 2017-11-14: 40 mg via INTRAVENOUS

## 2017-11-14 MED ORDER — SUGAMMADEX SODIUM 200 MG/2ML IV SOLN
INTRAVENOUS | Status: AC
  Filled 2017-11-14: qty 2

## 2017-11-14 MED ORDER — SUCCINYLCHOLINE CHLORIDE 20 MG/ML IJ SOLN
INTRAMUSCULAR | Status: DC | PRN
  Administered 2017-11-14: 50 mg via INTRAVENOUS

## 2017-11-14 MED ORDER — SUGAMMADEX SODIUM 200 MG/2ML IV SOLN
INTRAVENOUS | Status: DC | PRN
  Administered 2017-11-14: 90 mg via INTRAVENOUS

## 2017-11-14 MED ORDER — SUCCINYLCHOLINE CHLORIDE 20 MG/ML IJ SOLN
INTRAMUSCULAR | Status: AC
  Filled 2017-11-14: qty 1

## 2017-11-14 MED ORDER — DEXTROSE 5 % IV SOLN: 1.5000 g | INTRAVENOUS | Status: DC

## 2017-11-14 MED ORDER — ONDANSETRON HCL 4 MG/2ML IJ SOLN
INTRAMUSCULAR | Status: DC | PRN
  Administered 2017-11-14: 4 mg via INTRAVENOUS

## 2017-11-14 MED ORDER — LIDOCAINE HCL (PF) 2 % IJ SOLN
INTRAMUSCULAR | Status: AC
  Filled 2017-11-14: qty 10

## 2017-11-14 MED ORDER — SODIUM CHLORIDE 0.9 % IV SOLN
INTRAVENOUS | Status: DC
  Administered 2017-11-14: 05:00:00 via INTRAVENOUS

## 2017-11-14 MED ORDER — CLONIDINE HCL 0.1 MG PO TABS
0.1000 mg | ORAL_TABLET | Freq: Every day | ORAL | Status: DC
  Administered 2017-11-15 – 2017-11-19 (×5): 0.1 mg via ORAL
  Filled 2017-11-14 (×5): qty 1

## 2017-11-14 MED ORDER — PROPOFOL 10 MG/ML IV BOLUS
INTRAVENOUS | Status: AC
  Filled 2017-11-14: qty 20

## 2017-11-14 MED ORDER — FENTANYL CITRATE (PF) 100 MCG/2ML IJ SOLN: 25.0000 ug | INTRAMUSCULAR | Status: DC | PRN

## 2017-11-14 MED ORDER — ONDANSETRON HCL 4 MG/2ML IJ SOLN: 4.0000 mg | Freq: Once | INTRAMUSCULAR | Status: DC | PRN

## 2017-11-14 MED ORDER — ONDANSETRON HCL 4 MG/2ML IJ SOLN
INTRAMUSCULAR | Status: AC
  Filled 2017-11-14: qty 2

## 2017-11-14 MED ORDER — ROCURONIUM BROMIDE 50 MG/5ML IV SOLN
INTRAVENOUS | Status: AC
  Filled 2017-11-14: qty 1

## 2017-11-14 MED ORDER — EVICEL 2 ML EX KIT
PACK | CUTANEOUS | Status: DC | PRN
  Administered 2017-11-14: 2 mL

## 2017-11-14 MED ORDER — PROPOFOL 10 MG/ML IV BOLUS
INTRAVENOUS | Status: DC | PRN
  Administered 2017-11-14: 70 mg via INTRAVENOUS

## 2017-11-14 SURGICAL SUPPLY — 57 items
APPLIER CLIP 11 MED OPEN (CLIP)
APPLIER CLIP 9.375 SM OPEN (CLIP)
BAG COUNTER SPONGE EZ (MISCELLANEOUS) IMPLANT
BAG DECANTER FOR FLEXI CONT (MISCELLANEOUS) ×3 IMPLANT
BLADE SURG 15 STRL LF DISP TIS (BLADE) ×1 IMPLANT
BLADE SURG 15 STRL SS (BLADE) ×2
BLADE SURG SZ11 CARB STEEL (BLADE) ×3 IMPLANT
BOOT SUTURE AID YELLOW STND (SUTURE) ×3 IMPLANT
BRUSH SCRUB EZ  4% CHG (MISCELLANEOUS) ×2
BRUSH SCRUB EZ 4% CHG (MISCELLANEOUS) ×1 IMPLANT
CANISTER SUCT 1200ML W/VALVE (MISCELLANEOUS) ×3 IMPLANT
CLIP APPLIE 11 MED OPEN (CLIP) IMPLANT
CLIP APPLIE 9.375 SM OPEN (CLIP) IMPLANT
COUNTER SPONGE BAG EZ (MISCELLANEOUS)
DERMABOND ADVANCED (GAUZE/BANDAGES/DRESSINGS) ×2
DERMABOND ADVANCED .7 DNX12 (GAUZE/BANDAGES/DRESSINGS) ×1 IMPLANT
DRAPE INCISE IOBAN 66X45 STRL (DRAPES) ×3 IMPLANT
DRAPE LAPAROTOMY 100X77 ABD (DRAPES) ×3 IMPLANT
DURAPREP 26ML APPLICATOR (WOUND CARE) ×3 IMPLANT
ELECT CAUTERY BLADE 6.4 (BLADE) ×3 IMPLANT
ELECT REM PT RETURN 9FT ADLT (ELECTROSURGICAL) ×3
ELECTRODE REM PT RTRN 9FT ADLT (ELECTROSURGICAL) ×1 IMPLANT
GLOVE BIO SURGEON STRL SZ7 (GLOVE) ×6 IMPLANT
GLOVE INDICATOR 7.5 STRL GRN (GLOVE) ×3 IMPLANT
GOWN STRL REUS W/ TWL LRG LVL3 (GOWN DISPOSABLE) ×2 IMPLANT
GOWN STRL REUS W/ TWL XL LVL3 (GOWN DISPOSABLE) ×1 IMPLANT
GOWN STRL REUS W/TWL LRG LVL3 (GOWN DISPOSABLE) ×4
GOWN STRL REUS W/TWL XL LVL3 (GOWN DISPOSABLE) ×2
HEMOSTAT SURGICEL 2X3 (HEMOSTASIS) ×3 IMPLANT
IV NS 500ML (IV SOLUTION) ×2
IV NS 500ML BAXH (IV SOLUTION) ×1 IMPLANT
KIT RM TURNOVER STRD PROC AR (KITS) ×3 IMPLANT
LABEL OR SOLS (LABEL) ×3 IMPLANT
LOOP RED MAXI  1X406MM (MISCELLANEOUS) ×4
LOOP VESSEL MAXI 1X406 RED (MISCELLANEOUS) ×2 IMPLANT
LOOP VESSEL MINI 0.8X406 BLUE (MISCELLANEOUS) ×2 IMPLANT
LOOPS BLUE MINI 0.8X406MM (MISCELLANEOUS) ×4
NS IRRIG 500ML POUR BTL (IV SOLUTION) ×3 IMPLANT
PACK BASIN MAJOR ARMC (MISCELLANEOUS) ×3 IMPLANT
PATCH CAROTID ECM VASC 1X10 (Prosthesis & Implant Heart) ×3 IMPLANT
SUT PROLENE 5 0 RB 1 DA (SUTURE) ×6 IMPLANT
SUT PROLENE 6 0 BV (SUTURE) ×12 IMPLANT
SUT PROLENE 7 0 BV 1 (SUTURE) ×6 IMPLANT
SUT SILK 2 0 (SUTURE) ×2
SUT SILK 2-0 18XBRD TIE 12 (SUTURE) ×1 IMPLANT
SUT SILK 3 0 (SUTURE) ×2
SUT SILK 3-0 18XBRD TIE 12 (SUTURE) ×1 IMPLANT
SUT SILK 4 0 (SUTURE) ×2
SUT SILK 4-0 18XBRD TIE 12 (SUTURE) ×1 IMPLANT
SUT VIC AB 2-0 CT1 27 (SUTURE) ×4
SUT VIC AB 2-0 CT1 TAPERPNT 27 (SUTURE) ×2 IMPLANT
SUT VIC AB 3-0 SH 27 (SUTURE) ×2
SUT VIC AB 3-0 SH 27X BRD (SUTURE) ×1 IMPLANT
SUT VICRYL+ 3-0 36IN CT-1 (SUTURE) ×6 IMPLANT
SYR 20CC LL (SYRINGE) ×3 IMPLANT
SYR 5ML LL (SYRINGE) ×3 IMPLANT
TRAY FOLEY W/METER SILVER 16FR (SET/KITS/TRAYS/PACK) ×3 IMPLANT

## 2017-11-14 NOTE — Transfer of Care (Signed)
Immediate Anesthesia Transfer of Care Note  Patient: Dawn [name]  Procedure(s) Performed: ENDARTERECTOMY FEMORAL (Left )  Patient Location: PACU  Anesthesia Type:General  Level of Consciousness: awake and responds to stimulation  Airway & Oxygen Therapy: Patient Spontanous Breathing and Patient connected to face mask oxygen  Post-op Assessment: Report given to RN and Post -op Vital signs reviewed and stable  Post vital signs: Reviewed and stable  Last Vitals:  Vitals:   11/14/17 0428 11/14/17 1016  BP: (!) 150/45 (!) 184/73  Pulse: 84 85  Resp: 17 17  Temp: 36.7 C   SpO2: 94% 100%    Last Pain:  Vitals:   11/14/17 0428  TempSrc: Oral  PainSc:       Patients Stated Pain Goal: 4 (76/70/11 0034)  Complications: No apparent anesthesia complications

## 2017-11-14 NOTE — Op Note (Signed)
OPERATIVE NOTE   PROCEDURE: 1. Left common femoral, superficial femoral and profunda femoris endarterectomy with Cormatrix patch angioplasty  PRE-OPERATIVE DIAGNOSIS: Atherosclerotic occlusive disease left lower extremity with lifestyle limiting claudication and rest pain symptoms; hypertension  POST-OPERATIVE DIAGNOSIS: Same  CO-SURGEON: Dawn Cabal, MD and Dawn [name], M.D.  ASSISTANT(S): None  ANESTHESIA: general  ESTIMATED BLOOD LOSS: 50 cc  FINDING(S): 1. Profound calcific plaque noted of the left common femoral extending past the initial bifurcation of the profunda femoris arteries as well as down the extensive length of the SFA  SPECIMEN(S):  Calcific plaque from the common femoral, superficial femoral and the profunda femoris artery  INDICATIONS:   Dawn [name] 83 y.o. y.o.female who presents with complaints of lifestyle limiting claudication and pain continuously in the left lower extremity. The patient has documented severe atherosclerotic occlusive disease and has undergone minimally invasive treatments in the past. However, at this point his primary area of stricture stenosis resides in the common femoral and origins of the superficial femoral and profunda femoris extending into these arteries and therefore this is not amenable to intervention. The patient is therefore undergoing open endarterectomy. The risks and benefits of surgery have been reviewed with the patient, all questions have answered; alternative therapies have been reviewed as well and the patient has agreed to proceed with surgical open repair.  DESCRIPTION: After obtaining full informed written consent, the patient was brought back to the operating room and placed supine upon the operating table.  The patient received IV antibiotics prior to induction.  After obtaining adequate anesthesia, the patient was prepped and draped in the standard fashion  for left femoral exposure.  Attention was turned to the left groin with Dr. Lucky Cowboy working on the patient's left and myself working on the right of the patient.  Vertical  Incision was made over the left common femoral artery and dissection carried down to the common femoral artery with electrocautery.  I dissected out the common femoral artery from the distal external iliac artery (identified by the superficial circumflex vessels) down to the femoral bifurcation.  On initial inspection, the common femoral artery was: densely calcified and there was no palpable pulse noted.    Subsequently the dissection was continued to include all circumflex branches and the profunda femoral artery and superficial femoral artery. The superficial femoral artery was dissected circumferentially for a distance of approximately 3-4 cm and the profunda femoris was dissected circumferentially out to the fourth order branches individual vessel loops were placed around each branch.  Control of all branches was obtained with vessel loops.  A softer area in the distal external iliac artery amendable to clamping was identified.    The patient was given 5000 units of Heparin intravenously, which was a therapeutic bolus.   After waiting 3 minutes, the distal external iliac artery was clamped and all of the vessel loops were placed under tension.  Arteriotomy was made in the common femoral artery with a 11-blade and extended it with a Potts scissor proximally and distally extending the distal end down the profunda for approximately 3 cm.   Endarterectomy was then performed under direct visualization using a freer elevator and a right angle from the mid common femoral extending up both proximally and distally. Proximally the endarterectomy was brought up to the level of the clamp where a clean edge was obtained. Distally the endarterectomy was carried down to a soft spot in the profunda where a feathered edge was obtained.  The SFA was  treated with an eversion technique extending endarterectomy approximately 2 cm distally again obtaining a featheredge on both sides right and left.   At this point, a corematrix patch was fashioned for the geometry of the arteriotomy.  The patch was sewn to the artery with 2 running stitches of 6-0 Prolene, running from each end.  Prior to completing the patch angioplasty, the profunda femoral artery was flushed as was the superficial femoral artery. The system was then forward flushed. The endarterectomy site was then irrigated copiously with heparinized saline. The patch angioplasty was completed in the usual fashion.  Flow was then reestablished first to the profunda femoris and then the superficial femoral artery. Any gaps or bleeding sites in the suture line were easily controlled with a 6-0 Prolene suture.   The left groin was then irrigated copiously with sterile saline and subsequently Evicel and Surgicel were placed in the wound. The incision was repaired with a double layer of 2-0 Vicryl, a double layer of 3-0 Vicryl, and a layer of 4-0 Monocryl in a subcuticular fashion.  The skin was cleaned, dried, and reinforced with Dermabond.  COMPLICATIONS: None  CONDITION: Dawn [name], M.D. [facility] Office: [phone]  11/14/2017, 5:12 PM

## 2017-11-14 NOTE — Anesthesia Postprocedure Evaluation (Signed)
Anesthesia Post Note  Patient: Dawn [name]  Procedure(s) Performed: ENDARTERECTOMY FEMORAL (Left )  Patient location during evaluation: PACU Anesthesia Type: General Level of consciousness: awake and alert and oriented Pain management: pain level controlled Vital Signs Assessment: post-procedure vital signs reviewed and stable Respiratory status: spontaneous breathing Cardiovascular status: blood pressure returned to baseline Anesthetic complications: no     Last Vitals:  Vitals:   11/14/17 1117 11/14/17 1135  BP: (!) 174/50 (!) 174/50  Pulse: 78 83  Resp: 18 15  Temp:    SpO2: 92% 97%    Last Pain:  Vitals:   11/14/17 1233  TempSrc:   PainSc: Asleep                 Glorine Hanratty

## 2017-11-14 NOTE — Anesthesia Procedure Notes (Signed)

## 2017-11-14 NOTE — Progress Notes (Signed)
Patient has rested quietly this shift since returning from procedure. Pain management by PRN medications. Surgical incision with dressing intact.

## 2017-11-14 NOTE — Anesthesia Post-op Follow-up Note (Signed)
Anesthesia QCDR form completed.        

## 2017-11-14 NOTE — H&P (Signed)
[facility] History & Physical Update  The patient was interviewed and re-examined.  The patient's previous History and Physical has been reviewed and is unchanged.  There is no change in the plan of care. We plan to proceed with the scheduled procedure.  Leotis Pain, MD  11/14/2017, 7:26 AM

## 2017-11-14 NOTE — Op Note (Signed)
OPERATIVE NOTE   PROCEDURE: 1. 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  Status post left SFA, popliteal, and tibial intervention with residual profunda femoris, common femoral, and origin left SFA disease  POST-OPERATIVE DIAGNOSIS: Same  SURGEON: Leotis Pain, MD  CO-surgeon:Dr. Hortencia Pilar, MD  ANESTHESIA: general  ESTIMATED BLOOD LOSS: 50 cc  FINDING(S): 1. significant plaque in left common femoral, profunda femoris, and superficial femoral arteries  SPECIMEN(S): Left common femoral, profunda femoris, and superficial femoral artery plaque.  INDICATIONS:  Patient presents with rest pain.  She has undergone percutaneous intervention for her tibial, popliteal, and SFA disease but has residual femoral bifurcation disease that is to be addressed today.  Left femoral endarterectomy is planned to try to improve perfusion. The risks and benefits as well as alternative therapies including intervention were reviewed in detail all questions were answered the patient agrees to proceed with surgery.  DESCRIPTION: After obtaining full informed written consent, the patient was brought back to the operating room and placed supine upon the operating table. The patient received IV antibiotics prior to induction. After obtaining adequate anesthesia, the patient was prepped and draped in the standard fashion appropriate time out is called.   Vertical incision was created overlying the left femoral arteries. The common femoral artery proximally, and superficial femoral artery, and primary profunda femoris artery branches were encircled with vessel loops and prepared for control. The left femoral arteries were found to have significant plaque from the common femoral artery into the profunda and superficial femoral  arteries.  The left SFA stent started about 4-5 cm beyond the origin and there did appear to be bulky plaque proximal to this.  We dissected out to the stent for an area of control.  The profunda femoris artery was diseased well out beyond its origin high-grade stenosis and we dissected out some 5-6 cm down the artery.  A large posterior branch and a large lateral branch of the profunda femoris artery were also identified and controlled with Vesseloops   4000 units of heparin was given and allowed circulate for 5 minutes.   Attention is then turned to the left femoral artery. An arteriotomy is made with 11 blade and extended with Potts scissors in the common femoral artery and carried down onto the first 3-4 cm of the profunda femoris artery. An endarterectomy was then performed. [facility] was used to create a plane. The proximal endpoint was cut flush with tenotomy scissors. This was in the proximal common femoral artery. An eversion endarterectomy was then performed for the first 2-3 cm of the proximal left superficial femoral artery to remove the bulky plaque to try to avoid disease that would limit flow distally and occluded the previously placed stents. Good backbleeding was then seen. The distal endpoint of the profunda femoris endarterectomy was created with gentle traction and the distal endpoint was relatively clean.  She had posterior plaque which tracked as far down as could be dissected, but we were happy with our endpoint and we had also restored flow to the 2 large more proximal profunda femoris branches. There was a slight gap between the profunda femoris origin of the superficial femoral origin which was reapproximated with interrupted 6-0 Prolene sutures to help facilitate the suture line closure. At this point, the Cormatrix patcth is then selected and prepared for a patch angioplasty.  It is cut and  beveled and started at the proximal endpoint with a 6-0 Prolene suture.   Approximately one half of the suture line is run medially and laterally and the distal end point was cut and bevelled to match the arteriotomy.  A second 6-0 Prolene was started at the distal end point and run to the mid portion to complete the arteriotomy.  The vessel was flushed prior to release of control and completion of the anastomosis.  At this point, flow was established first to the profunda femoris artery and then to the superficial femoral artery. Easily palpable pulses are noted well beyond the anastomosis and both arteries.  Surgicel and Evicel topical hemostatic agents were placed in the femoral incision and hemostasis was complete. The femoral incision was then closed in a layered fashion with 2 layers of 2-0 Vicryl, 2 layers of 3-0 Vicryl, and 4-0 Monocryl for the skin closure. Dermabond and sterile dressing were then placed over the incision.  The patient was then awakened from anesthesia and taken to the recovery room in stable condition having tolerated the procedure well.  COMPLICATIONS: None  CONDITION: Stable     Leotis Pain 11/14/2017 10:07 AM   This note was created with Dragon Medical transcription system. Any errors in dictation are purely unintentional.

## 2017-11-15 ENCOUNTER — Encounter: Payer: Self-pay | Admitting: Vascular Surgery

## 2017-11-15 MED ORDER — SODIUM CHLORIDE 0.9% FLUSH
3.0000 mL | Freq: Two times a day (BID) | INTRAVENOUS | Status: DC
  Administered 2017-11-16 – 2017-11-22 (×13): 3 mL via INTRAVENOUS

## 2017-11-15 NOTE — Evaluation (Signed)
Physical Therapy Evaluation Patient Details Name: Dawn [name] MRN: [id number] DOB: 08-11-1928 Today's Date: 11/15/2017   History of Present Illness  Pt admitted for L femoral endarterectomy. Pt reports she has had multiple surgeries recently for issues and is having functional decline regarding mobility.  Clinical Impression  Pt is a pleasant 83 year old female who was admitted for L femoral endarterectomy on 12/19. Pt performs bed mobility/transfers with mod assist and RW. Pt demonstrates deficits with strength/pain/mobility. Pt unable to perform ambulation at this time due to pain and weakness. Currently not at baseline level.  Would benefit from skilled PT to address above deficits and promote optimal return to PLOF; recommend transition to STR upon discharge from acute hospitalization.       Follow Up Recommendations SNF    Equipment Recommendations  None recommended by PT    Recommendations for Other Services OT consult     Precautions / Restrictions Precautions Precautions: Fall Restrictions Weight Bearing Restrictions: No      Mobility  Bed Mobility Overal bed mobility: Needs Assistance Bed Mobility: Supine to Sit     Supine to sit: Mod assist     General bed mobility comments: needs assist for B LE and trunk support. Once seated, able to maintain balance, however fatigues quickly, needs heavy assist for scooting up towards [place].  Transfers Overall transfer level: Needs assistance Equipment used: Rolling walker (2 wheeled) Transfers: Sit to/from Stand Sit to Stand: Mod assist         General transfer comment: 2 attempts to stand, each for 10 seconds. Severe WBing pain in B LEs upon transfer. Unable to self support and request to return seated.  Unable to further progress mobility. Forward flexed posture during standing.  Ambulation/Gait             General Gait Details: unable  Stairs            Wheelchair Mobility    Modified  Rankin (Stroke Patients Only)       Balance Overall balance assessment: Needs assistance Sitting-balance support: Feet supported Sitting balance-Leahy Scale: Good     Standing balance support: Bilateral upper extremity supported Standing balance-Leahy Scale: Fair                               Pertinent Vitals/Pain Pain Assessment: Faces Faces Pain Scale: Hurts whole lot Pain Location: B legs distal to knee. L worse than R Pain Descriptors / Indicators: Operative site guarding Pain Intervention(s): Limited activity within patient's tolerance;Repositioned    Home Living Family/patient expects to be discharged to:: Private residence Living Arrangements: Children Available Help at Discharge: Family;Available PRN/intermittently(son works 3rd shift) Type of Home: House Home Access: Stairs to enter Entrance Stairs-Rails: None Technical brewer of Steps: 1 Home Layout: One level Home Equipment: Environmental consultant - 2 wheels;Cane - single point;Shower seat;Transport Academic librarian)      Prior Function Level of Independence: Needs assistance         Comments: has recently only been able to ambulate bed->chair     Hand Dominance        Extremity/Trunk Assessment   Upper Extremity Assessment Upper Extremity Assessment: Generalized weakness(B UE grossly 3+/5)    Lower Extremity Assessment Lower Extremity Assessment: Generalized weakness(B LE grossly 3/5)       Communication   Communication: No difficulties  Cognition Arousal/Alertness: Awake/alert Behavior During Therapy: WFL for tasks assessed/performed Overall Cognitive Status: Within Functional Limits for  tasks assessed                                        General Comments      Exercises     Assessment/Plan    PT Assessment Patient needs continued PT services  PT Problem List Decreased strength;Decreased activity tolerance;Decreased balance;Decreased mobility;Pain        PT Treatment Interventions Gait training;DME instruction;Therapeutic activities;Therapeutic exercise    PT Goals (Current goals can be found in the Care Plan section)  Acute Rehab PT Goals Patient Stated Goal: to get stronger PT Goal Formulation: With patient Time For Goal Achievement: 11/29/17 Potential to Achieve Goals: Good    Frequency Min 2X/week   Barriers to discharge Inaccessible home environment;Decreased caregiver support      Co-evaluation               AM-PAC PT "6 Clicks" Daily Activity  Outcome Measure Difficulty turning over in bed (including adjusting bedclothes, sheets and blankets)?: Unable Difficulty moving from lying on back to sitting on the side of the bed? : Unable Difficulty sitting down on and standing up from a chair with arms (e.g., wheelchair, bedside commode, etc,.)?: Unable Help needed moving to and from a bed to chair (including a wheelchair)?: A Lot Help needed walking in hospital room?: A Lot Help needed climbing 3-5 steps with a railing? : Total 6 Click Score: 8    End of Session Equipment Utilized During Treatment: Gait belt Activity Tolerance: Patient limited by pain Patient left: in bed;with bed alarm set Nurse Communication: Mobility status PT Visit Diagnosis: Muscle weakness (generalized) (M62.81);Difficulty in walking, not elsewhere classified (R26.2);Pain Pain - Right/Left: Left Pain - part of body: Leg    Time: 9381-0175 PT Time Calculation (min) (ACUTE ONLY): 28 min   Charges:   PT Evaluation $PT Eval Low Complexity: 1 Low PT Treatments $Therapeutic Activity: 8-22 mins   PT G Codes:   PT G-Codes **NOT FOR INPATIENT CLASS** Functional Assessment Tool Used: AM-PAC 6 Clicks Basic Mobility Functional Limitation: Mobility: Walking and moving around Mobility: Walking and Moving Around Current Status (Z0258): At least 80 percent but less than 100 percent impaired, limited or restricted Mobility: Walking and Moving Around  Goal Status [phone]): At least 60 percent but less than 80 percent impaired, limited or restricted    Greggory Stallion, PT, DPT [phone]   Jackson Fetters 11/15/2017, 5:18 PM

## 2017-11-15 NOTE — Progress Notes (Signed)
Nutrition Follow Up Note   DOCUMENTATION CODES:   Severe malnutrition in context of social or environmental circumstances, Underweight  INTERVENTION:   Continue Ensure Enlive po BID, each supplement provides 350 kcal and 20 grams of protein. Patient prefers chocolate.  Recommend daily MVI.  Bowel regimen as needed   NUTRITION DIAGNOSIS:   Severe Malnutrition related to social / environmental circumstances(anorexia, early satiety) as evidenced by severe fat depletion, severe muscle depletion.  GOAL:   Patient will meet greater than or equal to 90% of their needs  -progressing   MONITOR:   PO intake, Supplement acceptance, Labs, Weight trends, I & O's   ASSESSMENT:   83 year old female with PMHx of sciatica, HTN, skin cancer, PAD who is admitted with ischemic left lower extremity now s/p angiography and intervention on 12/14. Plan is for femoral endarterectomy on 12/19.   Pt now s/p Left common femoral, superficial femoral and profunda femoris endarterectomy with Cormatrix patch angioplasty 12/19. Pt eating 25-100% of meals and drinking some Ensure. Per chart, pt is weight stable since admit. Continue to encourage supplements for post op healing. No BM noted since 12/14; recommend bowel regimen as needed.    Medications reviewed and include: aspirin, plavix, colace, hydrochlorothiazide, tramadol  Labs reviewed: Na 129(L), Cl 95(L), BUN 37(H), creat 1.04(H), Ca 8.6(L) adj. 9.48 wnl, alb 2.9(L) Hgb 9.0(L), Hct 26.9(L)  Diet Order:  Diet regular Room service appropriate? Yes; Fluid consistency: Thin  EDUCATION NEEDS:   No education needs have been identified at this time  Skin:  Reviewed RN Assessment (ecchymosis to bilateral arms), incision groin   Last BM:  11/09/2017  Height:   Ht Readings from Last 1 Encounters:  11/13/17 5\' 2"  (1.575 m)    Weight:   Wt Readings from Last 1 Encounters:  11/15/17 94 lb 3.2 oz (42.7 kg)    Ideal Body Weight:  50 kg  BMI:   Body mass index is 17.23 kg/m.  Estimated Nutritional Needs:   Kcal:  1285-1500 (30-35 kcal/kg)  Protein:  65-77 grams (1.5-1.8 grams/kg)  Fluid:  1.3-1.5 L/day (1 mL/kcal)  Koleen Distance MS, RD, LDN Pager #- [phone] After Hours Pager: [phone]

## 2017-11-15 NOTE — Progress Notes (Signed)
[facility]  Daily Progress Note   Subjective  - 1 Day Post-Op  Doing OK.  Still very weak.  No major events overnight  Objective Vitals:   11/14/17 1944 11/14/17 2340 11/15/17 0507 11/15/17 0909  BP: (!) 170/48 (!) 174/51 (!) 178/48 (!) 154/43  Pulse: 87 87 99 86  Resp: 18  18 12   Temp: 98.9 F (37.2 C)  98.4 F (36.9 C) 98.3 F (36.8 C)  TempSrc: Oral  Oral Oral  SpO2: 94%  92% 96%  Weight:   42.7 kg (94 lb 3.2 oz)   Height:        Intake/Output Summary (Last 24 hours) at 11/15/2017 1352 Last data filed at 11/15/2017 0947 Gross per 24 hour  Intake 360 ml  Output 1175 ml  Net -815 ml    PULM  CTAB CV  RRR VASC  Feet both warm, 1+ pedal pulses bilaterally  Laboratory CBC    Component Value Date/Time   WBC 7.7 11/14/2017 0614   HGB 9.0 (L) 11/14/2017 0614   HCT 26.9 (L) 11/14/2017 0614   PLT 266 11/14/2017 0614    BMET    Component Value Date/Time   NA 129 (L) 11/13/2017 1136   K 4.1 11/13/2017 1136   CL 95 (L) 11/13/2017 1136   CO2 25 11/13/2017 1136   GLUCOSE 133 (H) 11/13/2017 1136   BUN 37 (H) 11/13/2017 1136   CREATININE 1.04 (H) 11/13/2017 1136   CREATININE 0.92 03/13/2012 0805   CALCIUM 8.6 (L) 11/13/2017 1136   GFRNONAA 47 (L) 11/13/2017 1136   GFRNONAA 58 (L) 03/13/2012 0805   GFRAA 54 (L) 11/13/2017 1136   GFRAA >60 03/13/2012 0805    Assessment/Planning: POD #1 s/p left femoral endarterectomy   Work with PT  May need rehab.  Check labs tomorrow am    Leotis Pain  11/15/2017, 1:52 PM

## 2017-11-15 NOTE — Plan of Care (Signed)
  Clinical Measurements: Ability to maintain clinical measurements within normal limits will improve 11/15/2017 1416 - Not Progressing by Daron Offer, RN Note Sodium remains low at 129. Will continue to monitor labs. Wenda Low [facility]

## 2017-11-16 LAB — BASIC METABOLIC PANEL
Anion gap: 12 (ref 5–15)
BUN: 44 mg/dL — ABNORMAL HIGH (ref 6–20)
CHLORIDE: 91 mmol/L — AB (ref 101–111)
CO2: 23 mmol/L (ref 22–32)
Calcium: 8.4 mg/dL — ABNORMAL LOW (ref 8.9–10.3)
Creatinine, Ser: 1.09 mg/dL — ABNORMAL HIGH (ref 0.44–1.00)
GFR calc non Af Amer: 44 mL/min — ABNORMAL LOW (ref >60)
GFR, EST AFRICAN AMERICAN: 51 mL/min — AB (ref >60)
Glucose, Bld: 164 mg/dL — ABNORMAL HIGH (ref 65–99)
POTASSIUM: 3.5 mmol/L (ref 3.5–5.1)
SODIUM: 126 mmol/L — AB (ref 135–145)

## 2017-11-16 LAB — CBC
HEMATOCRIT: 25.7 % — AB (ref 35.0–47.0)
HEMOGLOBIN: 8.5 g/dL — AB (ref 12.0–16.0)
MCH: 28.2 pg (ref 26.0–34.0)
MCHC: 32.9 g/dL (ref 32.0–36.0)
MCV: 85.7 fL (ref 80.0–100.0)
Platelets: 344 10*3/uL (ref 150–440)
RBC: 3 MIL/uL — AB (ref 3.80–5.20)
RDW: 14.6 % — ABNORMAL HIGH (ref 11.5–14.5)
WBC: 10.8 10*3/uL (ref 3.6–11.0)

## 2017-11-16 LAB — SURGICAL PATHOLOGY

## 2017-11-16 LAB — MAGNESIUM: MAGNESIUM: 1.8 mg/dL (ref 1.7–2.4)

## 2017-11-16 MED ORDER — SODIUM CHLORIDE 1 G PO TABS
2.0000 g | ORAL_TABLET | Freq: Once | ORAL | Status: AC
  Administered 2017-11-16: 2 g via ORAL
  Filled 2017-11-16: qty 2

## 2017-11-16 NOTE — Care Management Important Message (Signed)
Important Message  Patient Details  Name: Dawn [name] MRN: [id number] Date of Birth: 1928/06/10   Medicare Important Message Given:  Yes Signed IM notice given    Katrina Stack, RN 11/16/2017, 4:48 PM

## 2017-11-16 NOTE — Progress Notes (Signed)
Physical Therapy Treatment Patient Details Name: Dawn [name] MRN: [id number] DOB: 1928/09/11 Today's Date: 11/16/2017    History of Present Illness Pt admitted for L femoral endarterectomy. Pt reports she has had multiple surgeries recently for issues and is having functional decline regarding mobility.    PT Comments    Pt presented in bed c/o 8/10 pain nsg aware. Pt agreeable to attempt OOB. Pt required increased time and effort for mobility. Pt noted to have lateral and posterior lean initially requiring multimodal cues for correction then progressing to maintaining with supervision. Pt limited by pain however was able to achieve full standing after x 2 attempts. Pt unable to tolerate maintaining wt bearing on LLE to progress mobility. Pt would continue to benefit from skilled PT to improve strength, endurance and functional mobility.    Follow Up Recommendations  SNF     Equipment Recommendations  None recommended by PT    Recommendations for Other Services       Precautions / Restrictions Precautions Precautions: Fall Restrictions Weight Bearing Restrictions: No    Mobility  Bed Mobility Overal bed mobility: Needs Assistance Bed Mobility: Supine to Sit;Sit to Supine     Supine to sit: Min assist Sit to supine: Mod assist   General bed mobility comments: required assist for LE placement  Transfers Overall transfer level: Needs assistance Equipment used: Rolling walker (2 wheeled) Transfers: Sit to/from Stand Sit to Stand: Mod assist         General transfer comment: 2 attempts able to acheive full standing on sec on second attempt  Ambulation/Gait             General Gait Details: unable   Stairs            Wheelchair Mobility    Modified Rankin (Stroke Patients Only)       Balance Overall balance assessment: Needs assistance Sitting-balance support: Feet supported Sitting balance-Leahy Scale: Fair Sitting balance -  Comments: verbal cues for upright posture in sitting as tend to demo R lateral lean   Standing balance support: Bilateral upper extremity supported Standing balance-Leahy Scale: Fair                              Cognition Arousal/Alertness: Awake/alert Behavior During Therapy: WFL for tasks assessed/performed Overall Cognitive Status: Within Functional Limits for tasks assessed                                        Exercises General Exercises - Lower Extremity Ankle Circles/Pumps: AROM;Both;15 reps;Supine    General Comments        Pertinent Vitals/Pain Pain Assessment: 0-10 Pain Score: 8  Pain Location: LLE proximal/anterior Pain Descriptors / Indicators: Numbness;Operative site guarding;Pins and needles Pain Intervention(s): Limited activity within patient's tolerance;Monitored during session;Patient requesting pain meds-RN notified    Home Living                      Prior Function            PT Goals (current goals can now be found in the care plan section) Acute Rehab PT Goals Patient Stated Goal: to get stronger PT Goal Formulation: With patient Time For Goal Achievement: 11/29/17 Potential to Achieve Goals: Good    Frequency    Min 2X/week      PT  Plan      Co-evaluation              AM-PAC PT "6 Clicks" Daily Activity  Outcome Measure  Difficulty turning over in bed (including adjusting bedclothes, sheets and blankets)?: Unable Difficulty moving from lying on back to sitting on the side of the bed? : Unable Difficulty sitting down on and standing up from a chair with arms (e.g., wheelchair, bedside commode, etc,.)?: Unable Help needed moving to and from a bed to chair (including a wheelchair)?: A Lot Help needed walking in hospital room?: A Lot Help needed climbing 3-5 steps with a railing? : Total 6 Click Score: 8    End of Session Equipment Utilized During Treatment: Gait belt Activity Tolerance:  Patient limited by pain Patient left: in bed;in CPM;with bed alarm set   PT Visit Diagnosis: Muscle weakness (generalized) (M62.81);Difficulty in walking, not elsewhere classified (R26.2);Pain Pain - Right/Left: Left Pain - part of body: Leg     Time: 1410-1440 PT Time Calculation (min) (ACUTE ONLY): 30 min  Charges:  $Therapeutic Activity: 23-37 mins                       Myrth Dahan  Scherry Laverne, PTA 11/16/2017, 2:48 PM

## 2017-11-16 NOTE — Clinical Social Work Note (Signed)
CSW attempted to call patient's son, unable to leave a message to discuss SNF placements.  Jones Broom. Norval Morton, MSW, [place]  11/16/2017 4:52 PM

## 2017-11-16 NOTE — Clinical Social Work Note (Signed)
Clinical Social Work Assessment  Patient Details  Name: Dawn [name] MRN: [id number] Date of Birth: 1928/05/31  Date of referral:  11/16/17               Reason for consult:  Facility Placement                Permission sought to share information with:  Family Supports, Customer service manager Permission granted to share information::  Yes, Verbal Permission Granted  Name::     Mora, Pedraza [phone]   Agency::  SNF admissions  Relationship::     Contact Information:     Housing/Transportation Living arrangements for the past 2 months:  Single Family Home Source of Information:  Patient Patient Interpreter Needed:  None Criminal Activity/Legal Involvement Pertinent to Current Situation/Hospitalization:  No - Comment as needed Significant Relationships:  Adult Children Lives with:  Adult Children Do you feel safe going back to the place where you live?  No Need for family participation in patient care:  No (Coment)  Care giving concerns:  Patient feels she needs some short term rehab before she is able to return back home.   Social Worker assessment / plan:  Patient is an 83 year old female who is alert and oriented x4.  Patient lives with her son, and has been to rehab in the past.  Patient was explained role of CSW and process for trying to find placement for patient.  CSW reminded patient how insurance will pay for the stay and what to expect at SNF.  Patient expressed that she remembers how it was while she in rehab the last time.  Patient did not have any other questions or concerns, she will discuss options with her son, and let CSW know in the morning.  Patient gave CSW permission to begin bed search in [place]. Employment status:  Retired Forensic scientist:  Medicare PT Recommendations:  [place] / Referral to community resources:  [place]  Patient/Family's Response to care:  Patient is in  agreement to going to SNF for short term rehab.  Patient/Family's Understanding of and Emotional Response to Diagnosis, Current Treatment, and Prognosis:  Patient is hopeful that she will not have to be in SNF for very long.  Emotional Assessment Appearance:  Appears stated age Attitude/Demeanor/Rapport:    Affect (typically observed):  Appropriate, Calm Orientation:  Oriented to  Time, Oriented to Self, Oriented to Place, Oriented to Situation Alcohol / Substance use:  Not Applicable Psych involvement (Current and /or in the community):  No (Comment)  Discharge Needs  Concerns to be addressed:  Lack of Support Readmission within the last 30 days:  No Current discharge risk:  Lack of support system Barriers to Discharge:  Continued Medical Work up   Anell Barr 11/16/2017, 5:56 PM

## 2017-11-16 NOTE — NC FL2 (Signed)
[place] LEVEL OF CARE SCREENING TOOL     IDENTIFICATION  Patient Name: Dawn [name] Birthdate: Mar 11, 1928 Sex: female Admission Date (Current Location): 11/09/2017  [place] and [place] Number:  Engineering geologist and Address:  [facility], [address] [place], [place] [address]      Provider Number: [id number]  Attending Physician Name and Address:  Algernon Huxley, MD  Relative Name and Phone Number:  [place], R[place] [phone]     Current Level of Care: Hospital Recommended Level of Care: [place] Prior Approval Number:    Date Approved/Denied:   PASRR Number: [id number]  Discharge Plan: SNF    Current Diagnoses: Patient Active Problem List   Diagnosis Date Noted  . Protein-calorie malnutrition, severe 11/14/2017  . Atherosclerosis of native arteries of extremity with rest pain ([place]) 11/09/2017  . Ischemic leg 11/09/2017  . Atherosclerotic peripheral vascular disease with rest pain ([place]) 11/09/2017  . Atypical chest pain 09/21/2017  . Renal artery stenosis ([place]) 06/15/2017  . Hyperlipidemia 05/09/2017  . Essential hypertension 10/17/2016  . Carotid stenosis 10/17/2016  . PVD (peripheral vascular disease) ([place]) 10/17/2016  . B12 deficiency 07/19/2016  . Vitamin D deficiency, unspecified 07/19/2016  . History of right hip replacement 11/04/2015  . Aftercare following bilateral hip joint replacement surgery 11/05/2014  . SCC (squamous cell carcinoma) 05/21/2014  . COPD with asthma ([place]) 03/16/2014  . S/P hip replacement 10/03/2012    Orientation RESPIRATION BLADDER Height & Weight     Self, Time, Situation, Place  Normal Continent Weight: 95 lb 3.2 oz (43.2 kg) Height:  5\' 2"  (157.5 cm)  BEHAVIORAL SYMPTOMS/MOOD NEUROLOGICAL BOWEL NUTRITION STATUS      Continent Diet(Regular diet)  AMBULATORY STATUS COMMUNICATION OF NEEDS Skin   Limited Assist Verbally Surgical wounds                     Personal Care Assistance Level of Assistance  Bathing, Feeding, Dressing Bathing Assistance: Limited assistance Feeding assistance: Limited assistance Dressing Assistance: Limited assistance     Functional Limitations Info  Sight, Hearing, Speech Sight Info: Adequate Hearing Info: Adequate Speech Info: Adequate    SPECIAL CARE FACTORS FREQUENCY  PT (By licensed PT), OT (By licensed OT)     PT Frequency: 5x a week OT Frequency: 5x a week            Contractures Contractures Info: Not present    Additional Factors Info  Code Status, Allergies Code Status Info: Full Code Allergies Info: PERCOCET OXYCODONE-ACETAMINOPHEN, PERCODAN OXYCODONE-ASPIRIN            Current Medications (11/16/2017):  This is the current hospital active medication list Current Facility-Administered Medications  Medication Dose Route Frequency Provider Last Rate Last Dose  . acetaminophen (TYLENOL) tablet 650 mg  650 mg Oral Q6H PRN Stegmayer, Kimberly A, PA-C       Or  . acetaminophen (TYLENOL) suppository 650 mg  650 mg Rectal Q6H PRN Stegmayer, Kimberly A, PA-C      . aspirin EC tablet 81 mg  81 mg Oral Daily Algernon Huxley, MD   81 mg at 11/16/17 0841  . atorvastatin (LIPITOR) tablet 20 mg  20 mg Oral q1800 Stegmayer, Kimberly A, PA-C   20 mg at 11/15/17 1704  . cloNIDine (CATAPRES) tablet 0.1 mg  0.1 mg Oral Daily Algernon Huxley, MD   0.1 mg at 11/16/17 0841   And  . cloNIDine (CATAPRES) tablet 0.2  mg  0.2 mg Oral QHS Algernon Huxley, MD   0.2 mg at 11/15/17 2059  . clopidogrel (PLAVIX) tablet 75 mg  75 mg Oral Daily Algernon Huxley, MD   75 mg at 11/16/17 0840  . docusate sodium (COLACE) capsule 100 mg  100 mg Oral BID Algernon Huxley, MD   100 mg at 11/16/17 0841  . feeding supplement (ENSURE ENLIVE) (ENSURE ENLIVE) liquid 237 mL  237 mL Oral BID BM Esco, Miechia A, MD   237 mL at 11/16/17 1406  . hydrochlorothiazide (HYDRODIURIL) tablet 25 mg  25 mg Oral Daily Stegmayer, Kimberly A,  PA-C   25 mg at 11/16/17 0840  . indomethacin (INDOCIN) capsule 50 mg  50 mg Oral BID PRN Stegmayer, Kimberly A, PA-C      . morphine 4 MG/ML injection 2 mg  2 mg Intravenous Q3H PRN Stegmayer, Kimberly A, PA-C   2 mg at 11/15/17 1920  . ondansetron (ZOFRAN) tablet 4 mg  4 mg Oral Q6H PRN Stegmayer, Kimberly A, PA-C       Or  . ondansetron (ZOFRAN) injection 4 mg  4 mg Intravenous Q6H PRN Stegmayer, Kimberly A, PA-C      . sodium chloride flush (NS) 0.9 % injection 3 mL  3 mL Intravenous Q12H Algernon Huxley, MD   3 mL at 11/16/17 0842  . sorbitol 70 % solution 30 mL  30 mL Oral Q8H PRN Algernon Huxley, MD      . traMADol Veatrice Bourbon) tablet 50 mg  50 mg Oral Q6H PRN Algernon Huxley, MD   50 mg at 11/16/17 1453     Discharge Medications: Please see discharge summary for a list of discharge medications.  Relevant Imaging Results:  Relevant Lab Results:   Additional Information SSN [id number]  Ross Ludwig, [place]

## 2017-11-16 NOTE — Progress Notes (Signed)
[facility]  Daily Progress Note   Subjective: 2 Days Post-Op: Left common femoral, profunda femoris, and superficial femoral artery endarterectomies and patch angioplasty  Patient is without complaint this A.M. She is resting comfortably in bed eating breakfast.  Some incisional and left lower extremity discomfort however this is tolerable and treated with pain medication.   Objective: Vitals:   11/15/17 1942 11/16/17 0322 11/16/17 0323 11/16/17 0815  BP: (!) 153/41  (!) 141/43 (!) 176/42  Pulse: 93 83 83 80  Resp: 18 17  16   Temp: 98 F (36.7 C)  97.8 F (36.6 C) 98.5 F (36.9 C)  TempSrc:    Oral  SpO2: 93% 92%  95%  Weight:   95 lb 3.2 oz (43.2 kg)   Height:        Intake/Output Summary (Last 24 hours) at 11/16/2017 0949 Last data filed at 11/16/2017 0346 Gross per 24 hour  Intake -  Output 600 ml  Net -600 ml   Physical Exam: A&Ox3, NAD CV: RRR Pulmonary: CTA Bilaterally Abdomen: Soft, Nontender, Nondistended Vascular:  Left Groin: Dressing intact. No signs of drainage, swelling or infection noted  Left lower extremity: Thigh is soft.  Calf soft.  Faint pedal pulses however her foot is warm.  Laboratory: CBC    Component Value Date/Time   WBC 7.7 11/14/2017 0614   HGB 9.0 (L) 11/14/2017 0614   HCT 26.9 (L) 11/14/2017 0614   PLT 266 11/14/2017 0614   BMET    Component Value Date/Time   NA 129 (L) 11/13/2017 1136   K 4.1 11/13/2017 1136   CL 95 (L) 11/13/2017 1136   CO2 25 11/13/2017 1136   GLUCOSE 133 (H) 11/13/2017 1136   BUN 37 (H) 11/13/2017 1136   CREATININE 1.04 (H) 11/13/2017 1136   CREATININE 0.92 03/13/2012 0805   CALCIUM 8.6 (L) 11/13/2017 1136   GFRNONAA 47 (L) 11/13/2017 1136   GFRNONAA 58 (L) 03/13/2012 0805   GFRAA 54 (L) 11/13/2017 1136   GFRAA >60 03/13/2012 0805   Assessment/Planning: The patient is an 83 year old female 2 Days Post-Op: Left common femoral, profunda femoris, and superficial femoral artery  endarterectomies and patch angioplasty 1) Will order CBC, BMP and magnesium to assess patient's labs today.  Labs ordered for tomorrow. 2) Patient with some nausea yesterday however seems to be tolerating her breakfast this morning. 3) Patient to continue to work with physical therapy and occupation therapy.  4) Based on physical therapy recommendations possible placement to SNF will order case management consult 5) ASA / Plavix combination for post-operative anticoagulation. On statin. 6) PO pain medication seems adequate at this time  Marcelle Overlie PA-C 11/16/2017 9:49 AM

## 2017-11-16 NOTE — Evaluation (Signed)
Occupational Therapy Evaluation Patient Details Name: Dawn [name] MRN: [id number] DOB: December 12, 1927 Today's Date: 11/16/2017    History of Present Illness Pt is an 83 y.o. female admitted for L femoral endarterectomy. Pt reports she has had multiple surgeries recently for issues and is having functional decline regarding mobility.   Clinical Impression   Pt. Presents with pain, and hypersensitivity to the LLE, weakness, limited activity tolerance, and impaired functional mobility. Pt. Resides at home with her son who works 3rd shift. Pt. Reports being able to perfrom morning care tasks, cooking, house cleaning, driving, and medication management. Pt. Reports her son performs the yardwork. Pt. Education was provided about energy conservation/work simplification techniques, and DME. Pt. Could benefit from OT services for ADL training, energy conservation/ work simplification strategies, there. Ex., and pt. education about home modification, and DME. Pt. Would benift from SNF level of care upon discharge, with follow-up OT services.     Follow Up Recommendations  SNF    Equipment Recommendations       Recommendations for Other Services       Precautions / Restrictions Precautions Precautions: Fall Restrictions Weight Bearing Restrictions: No      Mobility Bed Mobility Overal bed mobility: Needs Assistance Bed Mobility: Supine to Sit;Sit to Supine     Supine to sit: Min assist Sit to supine: Mod assist   General bed mobility comments: required assist for LE placement  Transfers Overall transfer level: Needs assistance Equipment used: Rolling walker (2 wheeled) Transfers: Sit to/from Stand Sit to Stand: Mod assist         General transfer comment: Mobility per PT    Balance Overall balance assessment: Needs assistance Sitting-balance support: Feet supported Sitting balance-Leahy Scale: Fair Sitting balance - Comments: verbal cues for upright posture in  sitting as tend to demo R lateral lean   Standing balance support: Bilateral upper extremity supported Standing balance-Leahy Scale: Fair                             ADL either performed or assessed with clinical judgement   ADL Overall ADL's : Independent;Needs assistance/impaired Eating/Feeding: Set up   Grooming: Set up;Bed level   Upper Body Bathing: Minimal assistance   Lower Body Bathing: Maximal assistance   Upper Body Dressing : Minimal assistance   Lower Body Dressing: Maximal assistance                 General ADL Comments: Pt. education was provided about A/E use for LE ADLs, and energy conservation/work simplification techniques.     Vision Patient Visual Report: (Pt. reports her left eye is sensitive to light, had previous bruising behind her eye, and is taking eye drops for it.. Pt. prefers to have the lights in the room dim)       Perception     Praxis      Pertinent Vitals/Pain Pain Assessment: 0-10 Pain Score: 8  Faces Pain Scale: Hurts whole lot Pain Location: LLE proximal/anterior Pain Descriptors / Indicators: Numbness;Operative site guarding;Pins and needles Pain Intervention(s): Limited activity within patient's tolerance;Monitored during session;Premedicated before session     Hand Dominance Right   Extremity/Trunk Assessment Upper Extremity Assessment Upper Extremity Assessment: Generalized weakness           Communication Communication Communication: No difficulties   Cognition Arousal/Alertness: Awake/alert Behavior During Therapy: WFL for tasks assessed/performed Overall Cognitive Status: Within Functional Limits for tasks assessed  General Comments       Exercises   Shoulder Instructions      Home Living Family/patient expects to be discharged to:: Private residence Living Arrangements: Children Available Help at Discharge: Family;Available 24  hours/day(SOn works 3rd shift) Type of Home: House Home Access: Stairs to enter [employer] of Steps: 1 Entrance Stairs-Rails: None Home Layout: One level               Home Equipment: Environmental consultant - 2 wheels;Cane - single point;Shower seat;Transport chair          Prior Functioning/Environment         OT Problem List: Decreased strength;Decreased activity tolerance;Decreased knowledge of use of DME or AE      OT Treatment/Interventions: Self-care/ADL training;DME and/or AE instruction;Therapeutic activities;Therapeutic exercise;Patient/family education;Energy conservation    OT Goals(Current goals can be found in the care plan section) Acute Rehab OT Goals Patient Stated Goal: To get stronger OT Goal Formulation: With patient Potential to Achieve Goals: Good  OT Frequency: Min 1X/week   Barriers to D/C:            Co-evaluation              AM-PAC PT "6 Clicks" Daily Activity     Outcome Measure Help from another person eating meals?: A Little Help from another person taking care of personal grooming?: A Little Help from another person toileting, which includes using toliet, bedpan, or urinal?: A Lot Help from another person bathing (including washing, rinsing, drying)?: A Lot Help from another person to put on and taking off regular upper body clothing?: A Little Help from another person to put on and taking off regular lower body clothing?: A Lot 6 Click Score: 15   End of Session    Activity Tolerance: Patient tolerated treatment well Patient left: in bed, call bell in reach  OT Visit Diagnosis: Muscle weakness (generalized) (M62.81)                Time: 1610-9604 OT Time Calculation (min): 17 min Charges:  OT General Charges $OT Visit: 1 Visit OT Evaluation $OT Eval Moderate Complexity: 1 Mod G-Codes: OT G-codes **NOT FOR INPATIENT CLASS** Functional Limitation: Self care Self Care Current Status (V4098): At least 60 percent but less  than 80 percent impaired, limited or restricted Self Care Goal Status (J1914): At least 1 percent but less than 20 percent impaired, limited or restricted   Harrel Carina, MS, OTR/L   Harrel Carina 11/16/2017, 5:13 PM

## 2017-11-16 NOTE — Clinical Social Work Note (Signed)
CSW spoke with patient and she is agreeable to going to SNF for short term rehab.  CSW presented bed offers and she is going to discuss it with her son in the morning and let CSW know.  Patient is thinking she wants to go to [facility] or [facility].  Jones Broom. Norval Morton, MSW, [place]  11/16/2017 5:48 PM

## 2017-11-17 LAB — BASIC METABOLIC PANEL
Anion gap: 11 (ref 5–15)
BUN: 43 mg/dL — AB (ref 6–20)
CHLORIDE: 90 mmol/L — AB (ref 101–111)
CO2: 27 mmol/L (ref 22–32)
Calcium: 8.8 mg/dL — ABNORMAL LOW (ref 8.9–10.3)
Creatinine, Ser: 0.99 mg/dL (ref 0.44–1.00)
GFR, EST AFRICAN AMERICAN: 58 mL/min — AB (ref >60)
GFR, EST NON AFRICAN AMERICAN: 50 mL/min — AB (ref >60)
Glucose, Bld: 95 mg/dL (ref 65–99)
POTASSIUM: 3.3 mmol/L — AB (ref 3.5–5.1)
SODIUM: 128 mmol/L — AB (ref 135–145)

## 2017-11-17 LAB — CBC
HCT: 27.2 % — ABNORMAL LOW (ref 35.0–47.0)
HEMOGLOBIN: 9 g/dL — AB (ref 12.0–16.0)
MCH: 28.2 pg (ref 26.0–34.0)
MCHC: 33 g/dL (ref 32.0–36.0)
MCV: 85.4 fL (ref 80.0–100.0)
PLATELETS: 376 10*3/uL (ref 150–440)
RBC: 3.18 MIL/uL — AB (ref 3.80–5.20)
RDW: 14.6 % — ABNORMAL HIGH (ref 11.5–14.5)
WBC: 10.4 10*3/uL (ref 3.6–11.0)

## 2017-11-17 LAB — MAGNESIUM: MAGNESIUM: 2.1 mg/dL (ref 1.7–2.4)

## 2017-11-17 NOTE — Progress Notes (Signed)
3 Days Post-Op   Subjective/Chief Complaint: Doing OK. Notes pain in left groin. Denies foot pain. Lack of appetite.   Objective: Vital signs in last 24 hours: Temp:  [98.1 F (36.7 C)-99.3 F (37.4 C)] 99.1 F (37.3 C) (12/22 0921) Pulse Rate:  [83-91] 91 (12/22 0921) Resp:  [14-18] 18 (12/22 0921) BP: (163-186)/(45-68) 186/55 (12/22 0921) SpO2:  [93 %-96 %] 96 % (12/22 0921) Weight:  [43.5 kg (96 lb)] 43.5 kg (96 lb) (12/22 0337) Last BM Date: 11/16/17  Intake/Output from previous day: No intake/output data recorded. Intake/Output this shift: Total I/O In: -  Out: 200 [Urine:200]  General appearance: alert and no distress Cardio: regular rate and rhythm, S1, S2 normal, no murmur, click, rub or gallop Extremities: Left groin dressing- C/D/I; leg/foot warm, faintly palpable pulse  Lab Results:  Recent Labs    11/16/17 1005 11/17/17 0610  WBC 10.8 10.4  HGB 8.5* 9.0*  HCT 25.7* 27.2*  PLT 344 376   BMET Recent Labs    11/16/17 1005 11/17/17 0610  NA 126* 128*  K 3.5 3.3*  CL 91* 90*  CO2 23 27  GLUCOSE 164* 95  BUN 44* 43*  CREATININE 1.09* 0.99  CALCIUM 8.4* 8.8*   PT/INR No results for input(s): LABPROT, INR in the last 72 hours. ABG No results for input(s): PHART, HCO3 in the last 72 hours.  Invalid input(s): PCO2, PO2  Studies/Results: No results found.  Anti-infectives: Anti-infectives (From admission, onward)   Start     Dose/Rate Route Frequency Ordered Stop   11/14/17 0630  ceFAZolin (ANCEF) 2 g in dextrose 5 % 100 mL IVPB     2 g 200 mL/hr over 30 Minutes Intravenous On call to O.R. 11/14/17 8466 11/14/17 0838   11/14/17 0600  ceFAZolin (ANCEF) IVPB 2g/100 mL premix  Status:  Discontinued     2 g 200 mL/hr over 30 Minutes Intravenous On call to O.R. 11/13/17 2217 11/14/17 0619   11/14/17 0032  cefUROXime (ZINACEF) 1.5 g in dextrose 5 % 50 mL IVPB  Status:  Discontinued     1.5 g 100 mL/hr over 30 Minutes Intravenous 30 min pre-op  11/14/17 0032 11/14/17 0618   11/09/17 1200  ceFAZolin (ANCEF) IVPB 2g/100 mL premix     2 g 200 mL/hr over 30 Minutes Intravenous  Once 11/09/17 1156 11/09/17 1359      Assessment/Plan: s/p Procedure(s): ENDARTERECTOMY FEMORAL (Left) Pain control  Ensure supplements for nutrition OOB as tolerated Plan for SNF when bed available and after patient discusses with family.  LOS: 8 days    Evaristo Bury 11/17/2017

## 2017-11-17 NOTE — Progress Notes (Signed)
Pt. Slept well throughout the night with no c/o pain, SOB or acute distress noted. HR ran SR in the 90's.

## 2017-11-18 MED ORDER — METOPROLOL TARTRATE 5 MG/5ML IV SOLN: 5.0000 mg | INTRAVENOUS | Status: DC | PRN

## 2017-11-18 MED ORDER — METOPROLOL TARTRATE 25 MG PO TABS
25.0000 mg | ORAL_TABLET | Freq: Two times a day (BID) | ORAL | Status: DC
  Administered 2017-11-18: 25 mg via ORAL
  Filled 2017-11-18: qty 1

## 2017-11-18 MED ORDER — POTASSIUM CHLORIDE 20 MEQ PO PACK
60.0000 meq | PACK | Freq: Once | ORAL | Status: AC
  Administered 2017-11-18: 60 meq via ORAL
  Filled 2017-11-18: qty 3

## 2017-11-18 MED ORDER — SODIUM CHLORIDE 1 G PO TABS
1.0000 g | ORAL_TABLET | Freq: Two times a day (BID) | ORAL | Status: DC
  Administered 2017-11-18 – 2017-11-22 (×8): 1 g via ORAL
  Filled 2017-11-18 (×10): qty 1

## 2017-11-18 MED ORDER — POTASSIUM CHLORIDE CRYS ER 20 MEQ PO TBCR
40.0000 meq | EXTENDED_RELEASE_TABLET | ORAL | Status: AC
  Administered 2017-11-18: 40 meq via ORAL
  Filled 2017-11-18: qty 2

## 2017-11-18 MED ORDER — MAGNESIUM OXIDE 400 (241.3 MG) MG PO TABS
200.0000 mg | ORAL_TABLET | Freq: Once | ORAL | Status: AC
  Administered 2017-11-18: 200 mg via ORAL
  Filled 2017-11-18: qty 1

## 2017-11-18 MED ORDER — ENSURE ENLIVE PO LIQD
237.0000 mL | Freq: Three times a day (TID) | ORAL | Status: DC
  Administered 2017-11-18 – 2017-11-22 (×11): 237 mL via ORAL

## 2017-11-18 MED ORDER — MEGESTROL ACETATE 400 MG/10ML PO SUSP
200.0000 mg | Freq: Two times a day (BID) | ORAL | Status: DC
  Administered 2017-11-18 – 2017-11-22 (×8): 200 mg via ORAL
  Filled 2017-11-18 (×10): qty 5

## 2017-11-18 NOTE — Consult Note (Signed)
Reason for Consult: No chief complaint on file.  Referring Physician: dr.Esco  Reason for consult: failure to thrive   HPI: Dawn [name] is an 83 y.o. female. With history of hypertension, sciatica is admitted to the vascular surgery service and had left femoral endarterectomy. Patient was seen by physical therapy and recommended skilled nursing facility. Hospitalist team is consulted regarding her failure to thrive. Patient reports generalized weakness but denies any depression. According to the son say she is not eating enough, probably and he didn't like the available food options. Patient feels like she does not have good appetite. Reports nausea but denies any vomiting. Tolerating diet  Past Medical History:  Diagnosis Date  . Cancer ([place])    skin cancer  . 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 Hypertension   . Sciatica   . Sciatica     Past Surgical History:  Procedure Laterality Date  . ABDOMINAL HYSTERECTOMY    . APPENDECTOMY    . CATARACT EXTRACTION, BILATERAL    . CHOLECYSTECTOMY    . ENDARTERECTOMY FEMORAL Right 04/19/2017   Procedure: ENDARTERECTOMY COMMON FEMORAL ARTERY, PROFUNDA  FEMORIS ARTERY, and  SUPERFICIAL FEMORAL ARTERY;  Surgeon: Algernon Huxley, MD;  Location: [facility];  Service: Vascular;  Laterality: Right;  . ENDARTERECTOMY FEMORAL Left 11/14/2017   Procedure: ENDARTERECTOMY FEMORAL;  Surgeon: Algernon Huxley, MD;  Location: [facility];  Service: Vascular;  Laterality: Left;  . HIP SURGERY Bilateral   . LOWER EXTREMITY ANGIOGRAM Right 04/19/2017   Procedure: LOWER EXTREMITY ANGIOGRAM WITH SUPERFICIAL FEMORAL ARTERY STENT PLACEMENT;  Surgeon: Algernon Huxley, MD;  Location: [facility];  Service: Vascular;  Laterality: Right;  . LOWER EXTREMITY ANGIOGRAPHY Right 04/18/2017   Procedure: Lower Extremity Angiography;  Surgeon: Algernon Huxley, MD;  Location: [place] [facility];  Service: Cardiovascular;  Laterality:  Right;  . LOWER EXTREMITY ANGIOGRAPHY Left 07/12/2017   Procedure: Lower Extremity Angiography;  Surgeon: Algernon Huxley, MD;  Location: [place] [facility];  Service: Cardiovascular;  Laterality: Left;  . LOWER EXTREMITY ANGIOGRAPHY Left 11/09/2017   Procedure: LOWER EXTREMITY ANGIOGRAPHY;  Surgeon: Algernon Huxley, MD;  Location: [place] [facility];  Service: Cardiovascular;  Laterality: Left;  . LOWER EXTREMITY INTERVENTION  07/12/2017   Procedure: Lower Extremity Intervention;  Surgeon: Algernon Huxley, MD;  Location: [place] [facility];  Service: Cardiovascular;;  . LOWER EXTREMITY INTERVENTION  11/09/2017   Procedure: LOWER EXTREMITY INTERVENTION;  Surgeon: Algernon Huxley, MD;  Location: [place] [facility];  Service: Cardiovascular;;  . RENAL ANGIOGRAPHY N/A 06/21/2017   Procedure: Renal Angiography;  Surgeon: Algernon Huxley, MD;  Location: [place] [facility];  Service: Cardiovascular;  Laterality: N/A;  . SHOULDER SURGERY Bilateral   . TOTAL HIP ARTHROPLASTY Bilateral     Family History  Problem Relation Age of Onset  . Diabetes Father   . Heart disease Father     Social History:  reports that she has quit smoking. Her smoking use included cigarettes. she has never used smokeless tobacco. She reports that she does not drink alcohol or use drugs.  Allergies:  Allergies  Allergen Reactions  . Percocet [Oxycodone-Acetaminophen] Itching  . Percodan [Oxycodone-Aspirin] Itching    Medications: I have reviewed the patient's current medications.  Results for orders placed or performed during the hospital encounter of 11/09/17 (from the past 48 hour(s))  CBC     Status: Abnormal  Collection Time: 11/17/17  6:10 AM  Result Value Ref Range   WBC 10.4 3.6 - 11.0 K/uL   RBC 3.18 (L) 3.80 - 5.20 MIL/uL   Hemoglobin 9.0 (L) 12.0 - 16.0 g/dL   HCT 27.2 (L) 35.0 - 47.0 %   MCV 85.4 80.0 - 100.0 fL   MCH 28.2 26.0 - 34.0 pg   MCHC 33.0 32.0 - 36.0 g/dL   RDW 14.6 (H) 11.5 - 14.5 %    Platelets 376 150 - 440 K/uL    Comment: Performed at [facility], [address] [place], [place] [address]  Basic metabolic panel     Status: Abnormal   Collection Time: 11/17/17  6:10 AM  Result Value Ref Range   Sodium 128 (L) 135 - 145 mmol/L   Potassium 3.3 (L) 3.5 - 5.1 mmol/L   Chloride 90 (L) 101 - 111 mmol/L   CO2 27 22 - 32 mmol/L   Glucose, Bld 95 65 - 99 mg/dL   BUN 43 (H) 6 - 20 mg/dL   Creatinine, Ser 0.99 0.44 - 1.00 mg/dL   Calcium 8.8 (L) 8.9 - 10.3 mg/dL   GFR calc non Af Amer 50 (L) >60 mL/min   GFR calc Af Amer 58 (L) >60 mL/min    Comment: (NOTE) The eGFR has been calculated using the CKD EPI equation. This calculation has not been validated in all clinical situations. eGFR's persistently <60 mL/min signify possible Chronic Kidney Disease.    Anion gap 11 5 - 15    Comment: Performed at [facility], [address] [place], [place] [address]  Magnesium     Status: None   Collection Time: 11/17/17  6:10 AM  Result Value Ref Range   Magnesium 2.1 1.7 - 2.4 mg/dL    Comment: Performed at [facility], [address] [place], [place] [address]    No results found.  ROS:  CONSTITUTIONAL: Denies fevers, chills. Denies any fatigue, reporting generalized weakness.  EYES: Denies blurry vision, double vision, eye pain. EARS, NOSE, THROAT: Denies tinnitus, ear pain, hearing loss. RESPIRATORY: Denies cough, wheeze, shortness of breath.  CARDIOVASCULAR: Denies chest pain, palpitations, edema.  GASTROINTESTINAL: Denies nausea, vomiting, diarrhea, abdominal pain. Denies bright red blood per rectum. GENITOURINARY: Denies dysuria, hematuria. ENDOCRINE: Denies nocturia or thyroid problems. HEMATOLOGIC AND LYMPHATIC: Denies easy bruising or bleeding. SKIN: Denies rash or lesion. MUSCULOSKELETAL: Denies pain in neck, back, shoulder, knees, hips or arthritic symptoms.  NEUROLOGIC: Denies paralysis, paresthesias.  PSYCHIATRIC: Denies  anxiety or depressive symptoms. Blood pressure (!) 161/49, pulse 89, temperature (!) 97.5 F (36.4 C), temperature source Oral, resp. rate 18, height 5' 2"  (1.575 m), weight 43.5 kg (96 lb), SpO2 95 %.   PHYSICAL EXAMINATION:  GENERAL: Well-nourished, well-developed currently in no acute distress.  HEAD: Normocephalic, atraumatic.  EYES: Pupils equal, round, and reactive to light. Extraocular muscles intact. No scleral icterus.  MOUTH: Moist mucosal membranes. Dentition intact. No abscess noted. EARS, NOSE, THROAT: Clear without exudates. No external lesions.  NECK: Supple. No thyromegaly. No nodules. No JVD.  PULMONARY: Clear to auscultation bilaterally without wheezes, rales, or rhonchi. No use of accessory muscles. Good respiratory effort. CHEST: Nontender to palpation.  CARDIOVASCULAR: S1, S2, regular rate and rhythm. No murmurs, rubs, or gallops.  GASTROINTESTINAL: Soft, nontender, nondistended. No masses. Positive bowel sounds. No hepatosplenomegaly. MUSCULOSKELETAL: Status post left lower extremity endarterectomy No swelling, clubbing, edema. Range of motion full in all extremities. NEUROLOGIC: Cranial nerves II-XII intact. No gross  focal neurological deficits. Sensation intact. Reflexes intact. SKIN: No ulcerations, lesions, rash, cyanosis. Skin warm, dry. Turgor intact. PSYCHIATRIC: Mood, affect within normal limits. Patient awake, alert, oriented x 3. Insight and judgment intact.   Assessment/Plan:  # FTT  Will increase ensure protein supplements to 3 times a day Consult dietitian regarding poor by mouth intake and calorie count  Megace is added to the regimen Patient denies any  Depression Discussed with patient's son to encourage her to take frequent small meals  #Hyponatremia could be secondary to hydrochlorothiazide and SIADH DC'd hydrochlorothiazide Salt tablets Check BMP in a.m.  #Hypokalemia replete and recheck.  Magnesium 2.1  #Hypertension elevated blood  pressure Hydrochlorothiazide discontinued Metoprolol is added to the regimen was titrated as needed Continue clonidine  #Generalized weakness Patient had endarterectomy of the lower extremity Continue incentive spirometry Encourage out of bed to chair each shift Patient will be benefited if physical therapy can work with her at least twice a day   Thank you for consulting hospitalist team to medically manage this patient  Plan of care discussed in detail with the patient and her son at bedside. They both verbalized understanding. Patient remains full code  TOTAL TIME TAKING CARE OF THIS PATIENT:43 minutes.   Note: This dictation was prepared with Dragon dictation along with smaller phrase technology. Any transcriptional errors that result from this process are unintentional.   @[facility] @ Pager - [phone] 11/18/2017, 11:05 AM

## 2017-11-18 NOTE — Progress Notes (Signed)
4 Days Post-Op   Subjective/Chief Complaint: Patient with evidence of failure to thrive. Denies pain in groin or leg. Denies abdominal pain or nausea, although she had some emesis this morning.   Objective: Vital signs in last 24 hours: Temp:  [97.5 F (36.4 C)-99.2 F (37.3 C)] 97.5 F (36.4 C) (12/23 0824) Pulse Rate:  [87-93] 89 (12/23 0824) Resp:  [14-18] 18 (12/23 0315) BP: (159-167)/(45-54) 161/49 (12/23 0824) SpO2:  [91 %-95 %] 95 % (12/23 0824) Weight:  [43.5 kg (96 lb)] 43.5 kg (96 lb) (12/23 0335) Last BM Date: 11/17/17  Intake/Output from previous day: 12/22 0701 - 12/23 0700 In: -  Out: 3664 [Urine:1050; Stool:1] Intake/Output this shift: Total I/O In: -  Out: 200 [Urine:200]  General appearance: alert and no distress Cardio: regular rate and rhythm, S1, S2 normal, no murmur, click, rub or gallop GI: soft, non-tender; bowel sounds normal; no masses,  no organomegaly Extremities: Left groin soft, incision, C/D/I. Left leg warm, +motor/sensory  Lab Results:  Recent Labs    11/16/17 1005 11/17/17 0610  WBC 10.8 10.4  HGB 8.5* 9.0*  HCT 25.7* 27.2*  PLT 344 376   BMET Recent Labs    11/16/17 1005 11/17/17 0610  NA 126* 128*  K 3.5 3.3*  CL 91* 90*  CO2 23 27  GLUCOSE 164* 95  BUN 44* 43*  CREATININE 1.09* 0.99  CALCIUM 8.4* 8.8*   PT/INR No results for input(s): LABPROT, INR in the last 72 hours. ABG No results for input(s): PHART, HCO3 in the last 72 hours.  Invalid input(s): PCO2, PO2  Studies/Results: No results found.  Anti-infectives: Anti-infectives (From admission, onward)   Start     Dose/Rate Route Frequency Ordered Stop   11/14/17 0630  ceFAZolin (ANCEF) 2 g in dextrose 5 % 100 mL IVPB     2 g 200 mL/hr over 30 Minutes Intravenous On call to O.R. 11/14/17 4034 11/14/17 0838   11/14/17 0600  ceFAZolin (ANCEF) IVPB 2g/100 mL premix  Status:  Discontinued     2 g 200 mL/hr over 30 Minutes Intravenous On call to O.R. 11/13/17  2217 11/14/17 0619   11/14/17 0032  cefUROXime (ZINACEF) 1.5 g in dextrose 5 % 50 mL IVPB  Status:  Discontinued     1.5 g 100 mL/hr over 30 Minutes Intravenous 30 min pre-op 11/14/17 0032 11/14/17 0618   11/09/17 1200  ceFAZolin (ANCEF) IVPB 2g/100 mL premix     2 g 200 mL/hr over 30 Minutes Intravenous  Once 11/09/17 1156 11/09/17 1359      Assessment/Plan: s/p Procedure(s): ENDARTERECTOMY FEMORAL (Left) Replace Electrolytes  OOB to chair Medicine Consult- failure to thrive, Appreciate input and care. Rehab when bed available.  LOS: 9 days    Dawn [name] A 11/18/2017

## 2017-11-19 ENCOUNTER — Encounter: Payer: Self-pay | Admitting: *Deleted

## 2017-11-19 LAB — COMPREHENSIVE METABOLIC PANEL
ALBUMIN: 2.2 g/dL — AB (ref 3.5–5.0)
ALT: 37 U/L (ref 14–54)
AST: 93 U/L — AB (ref 15–41)
Alkaline Phosphatase: 72 U/L (ref 38–126)
Anion gap: 8 (ref 5–15)
BILIRUBIN TOTAL: 0.5 mg/dL (ref 0.3–1.2)
BUN: 49 mg/dL — AB (ref 6–20)
CHLORIDE: 97 mmol/L — AB (ref 101–111)
CO2: 25 mmol/L (ref 22–32)
CREATININE: 0.94 mg/dL (ref 0.44–1.00)
Calcium: 8.4 mg/dL — ABNORMAL LOW (ref 8.9–10.3)
GFR calc Af Amer: >60 mL/min (ref >60)
GFR, EST NON AFRICAN AMERICAN: 53 mL/min — AB (ref >60)
GLUCOSE: 100 mg/dL — AB (ref 65–99)
POTASSIUM: 3.9 mmol/L (ref 3.5–5.1)
Sodium: 130 mmol/L — ABNORMAL LOW (ref 135–145)
Total Protein: 6.2 g/dL — ABNORMAL LOW (ref 6.5–8.1)

## 2017-11-19 LAB — CBC
HEMATOCRIT: 26.3 % — AB (ref 35.0–47.0)
HEMOGLOBIN: 8.6 g/dL — AB (ref 12.0–16.0)
MCH: 27.9 pg (ref 26.0–34.0)
MCHC: 32.7 g/dL (ref 32.0–36.0)
MCV: 85.4 fL (ref 80.0–100.0)
Platelets: 452 10*3/uL — ABNORMAL HIGH (ref 150–440)
RBC: 3.07 MIL/uL — AB (ref 3.80–5.20)
RDW: 15.2 % — ABNORMAL HIGH (ref 11.5–14.5)
WBC: 9 10*3/uL (ref 3.6–11.0)

## 2017-11-19 LAB — RETICULOCYTES
RBC.: 2.82 MIL/uL — ABNORMAL LOW (ref 3.80–5.20)
Retic Count, Absolute: 33.8 10*3/uL (ref 19.0–183.0)
Retic Ct Pct: 1.2 % (ref 0.4–3.1)

## 2017-11-19 LAB — BASIC METABOLIC PANEL
ANION GAP: 8 (ref 5–15)
BUN: 50 mg/dL — ABNORMAL HIGH (ref 6–20)
CO2: 25 mmol/L (ref 22–32)
Calcium: 8.3 mg/dL — ABNORMAL LOW (ref 8.9–10.3)
Chloride: 95 mmol/L — ABNORMAL LOW (ref 101–111)
Creatinine, Ser: 0.98 mg/dL (ref 0.44–1.00)
GFR calc Af Amer: 58 mL/min — ABNORMAL LOW (ref >60)
GFR calc non Af Amer: 50 mL/min — ABNORMAL LOW (ref >60)
GLUCOSE: 128 mg/dL — AB (ref 65–99)
POTASSIUM: 4.4 mmol/L (ref 3.5–5.1)
Sodium: 128 mmol/L — ABNORMAL LOW (ref 135–145)

## 2017-11-19 LAB — CBC WITH DIFFERENTIAL/PLATELET
BASOS ABS: 0.1 10*3/uL (ref 0–0.1)
BASOS PCT: 1 %
Eosinophils Absolute: 0.2 10*3/uL (ref 0–0.7)
Eosinophils Relative: 3 %
HEMATOCRIT: 23.6 % — AB (ref 35.0–47.0)
HEMOGLOBIN: 7.8 g/dL — AB (ref 12.0–16.0)
LYMPHS ABS: 1.1 10*3/uL (ref 1.0–3.6)
LYMPHS PCT: 13 %
MCH: 28.1 pg (ref 26.0–34.0)
MCHC: 33.2 g/dL (ref 32.0–36.0)
MCV: 84.7 fL (ref 80.0–100.0)
Monocytes Absolute: 0.7 10*3/uL (ref 0.2–0.9)
Monocytes Relative: 9 %
Neutro Abs: 6.2 10*3/uL (ref 1.4–6.5)
Neutrophils Relative %: 74 %
Platelets: 414 10*3/uL (ref 150–440)
RBC: 2.78 MIL/uL — AB (ref 3.80–5.20)
RDW: 15 % — ABNORMAL HIGH (ref 11.5–14.5)
WBC: 8.2 10*3/uL (ref 3.6–11.0)

## 2017-11-19 LAB — IRON AND TIBC
IRON: 11 ug/dL — AB (ref 28–170)
SATURATION RATIOS: 7 % — AB (ref 10.4–31.8)
TIBC: 167 ug/dL — AB (ref 250–450)
UIBC: 156 ug/dL

## 2017-11-19 LAB — FOLATE: FOLATE: 13.8 ng/mL (ref >5.9)

## 2017-11-19 LAB — FERRITIN: Ferritin: 129 ng/mL (ref 11–307)

## 2017-11-19 LAB — VITAMIN B12: Vitamin B-12: 1336 pg/mL — ABNORMAL HIGH (ref 180–914)

## 2017-11-19 MED ORDER — GUAIFENESIN-DM 100-10 MG/5ML PO SYRP
5.0000 mL | ORAL_SOLUTION | ORAL | Status: DC | PRN
  Administered 2017-11-19 – 2017-11-20 (×3): 5 mL via ORAL
  Filled 2017-11-19 (×3): qty 5

## 2017-11-19 MED ORDER — RISAQUAD PO CAPS
1.0000 | ORAL_CAPSULE | Freq: Every day | ORAL | Status: DC
  Administered 2017-11-19 – 2017-11-22 (×4): 1 via ORAL
  Filled 2017-11-19 (×4): qty 1

## 2017-11-19 MED ORDER — POTASSIUM CHLORIDE CRYS ER 20 MEQ PO TBCR
40.0000 meq | EXTENDED_RELEASE_TABLET | Freq: Once | ORAL | Status: AC
  Administered 2017-11-19: 40 meq via ORAL
  Filled 2017-11-19: qty 2

## 2017-11-19 MED ORDER — NYSTATIN 100000 UNIT/ML MT SUSP
5.0000 mL | Freq: Four times a day (QID) | OROMUCOSAL | Status: DC
  Administered 2017-11-19 (×2): 500000 [IU] via ORAL
  Filled 2017-11-19 (×4): qty 5

## 2017-11-19 MED ORDER — POTASSIUM CHLORIDE 20 MEQ PO PACK
40.0000 meq | PACK | Freq: Two times a day (BID) | ORAL | Status: DC
Start: 2017-11-19 — End: 2017-11-19

## 2017-11-19 MED ORDER — AMLODIPINE BESYLATE 5 MG PO TABS
5.0000 mg | ORAL_TABLET | Freq: Every day | ORAL | Status: DC
  Administered 2017-11-19 – 2017-11-20 (×2): 5 mg via ORAL
  Filled 2017-11-19 (×2): qty 1

## 2017-11-19 NOTE — Progress Notes (Signed)
CCMD called stating pt. Had a 2.89 second pause, HR dropped to 39 then quickly rebounded to 60-70's. Pt. Assessed and doing fine with no c/o pain, SOB or acute distress noted. Pt was alert and oriented. Dr. Estanislado Pandy notified and said to hold BP meds. Pt. Does not have any BP meds due at this time. Will continue to monitor pt.

## 2017-11-19 NOTE — Progress Notes (Signed)
Occupational Therapy Treatment Patient Details Name: Dawn [name] MRN: [id number] DOB: October 13, 1928 Today's Date: 11/19/2017    History of present illness 83 y.o. female admitted for L femoral endarterectomy. Pt reports she has had multiple surgeries recently for issues and is having functional decline regarding mobility.   OT comments  Patient seen for OT tx session this date, she just returned to bed after sitting up for one hour today and is fatigued but willing to work with OT.  Patient seen for grooming tasks for combing hair, washing face from bed level with setup, some assist to reach the back of her hair with comb.  Patient also seen for BUE ROM strengthening exercises for 2 sets of 5 reps each.  Rest breaks as needed.  Patient had previous issues and surgery to rotator cuff BUE in the distant past. Patient requires increased assistance with self care tasks and will benefit from continued therapy as well as short term rehab.   Follow Up Recommendations  SNF    Equipment Recommendations       Recommendations for Other Services      Precautions / Restrictions Precautions Precautions: Fall Restrictions Weight Bearing Restrictions: No       Mobility Bed Mobility Overal bed mobility: Needs Assistance Bed Mobility: Supine to Sit     Supine to sit: Min assist Sit to supine: Mod assist   General bed mobility comments: Pt showed good effort in getting to sitting, needed only light assist to scoot up to EOB  Transfers Overall transfer level: Needs assistance Equipment used: Rolling walker (2 wheeled) Transfers: Sit to/from Stand Sit to Stand: Mod assist         General transfer comment: Pt able to put some weight though LEs (R>L), but is very hesitant to do much WBing.  She needed constant assist to keep weight from falling back on bed and despite good effort needed very heavy assist to shuffle over to recliner    Balance Overall balance assessment: Needs  assistance Sitting-balance support: Feet supported Sitting balance-Leahy Scale: Fair       Standing balance-Leahy Scale: Zero Standing balance comment: Even with heavy cuing to use UE on walker she needed constant assist to keep form falling back, no real ambulation.                           ADL either performed or assessed with clinical judgement   ADL Overall ADL's : Needs assistance/impaired     Grooming: Set up;Bed level                                       Vision       Perception     Praxis      Cognition Arousal/Alertness: Awake/alert Behavior During Therapy: WFL for tasks assessed/performed Overall Cognitive Status: Within Functional Limits for tasks assessed                                 General Comments: Patient reports fatigue from PT session, was able to sit up for 1 hour        Exercises General Exercises - Lower Extremity Ankle Circles/Pumps: AROM;Both;15 reps;Supine Long Arc Quad: Strengthening;10 reps;AROM Heel Slides: Strengthening;10 reps;AROM Hip ABduction/ADduction: Strengthening;AROM;10 reps Hip Flexion/Marching: Strengthening;10 reps;AROM Other Exercises Other Exercises: Patient seen  for BUE strengthening exercises with towel for shoulder flexion, chest press, ABD for 5 repetitions for 2 sets.    Shoulder Instructions       General Comments      Pertinent Vitals/ Pain       Pain Assessment: No/denies pain Pain Score: 8  Pain Location: b/l LEs, L>R  Home Living                                          Prior Functioning/Environment              Frequency  Min 1X/week        Progress Toward Goals  OT Goals(current goals can now be found in the care plan section)  Progress towards OT goals: Progressing toward goals  Acute Rehab OT Goals Patient Stated Goal: To get stronger OT Goal Formulation: With patient Potential to Achieve Goals: Good  Plan Discharge  plan remains appropriate    Co-evaluation                 AM-PAC PT "6 Clicks" Daily Activity     Outcome Measure                    End of Session    OT Visit Diagnosis: Muscle weakness (generalized) (M62.81)   Activity Tolerance Patient tolerated treatment well;Patient limited by lethargy   Patient Left in bed   Nurse Communication          Time: 1140-1201 OT Time Calculation (min): 21 min  Charges: OT General Charges $OT Visit: 1 Visit OT Treatments $Therapeutic Exercise: 8-22 mins   T , OTR/L, CLT    , 11/19/2017, 12:06 PM

## 2017-11-19 NOTE — Care Management (Signed)
Patient with decreased appetite.  Dietary consult for calorie count. may benefit from palliative consult to follow for treatment goals.

## 2017-11-19 NOTE — Progress Notes (Signed)
Physical Therapy Treatment Patient Details Name: Dawn [name] MRN: [id number] DOB: 04-12-1928 Today's Date: 11/19/2017    History of Present Illness 83 y.o. female admitted for L femoral endarterectomy. Pt reports she has had multiple surgeries recently for issues and is having functional decline regarding mobility.    PT Comments    Pt did show good effort with PT session but is still very pain limited in LEs (L>R) and was unable to functionally bear weight though b/l LEs even with heavy cuing to use walker and assist to unweight/keep weight forward.  Overall pt was able to tolerate more today but is not at all close to being able to functionally walk/mobilize.   Pt with general weakness and pain with light palpation of distal LEs for exercises, but showed good effort and better tolerance today.  Follow Up Recommendations  SNF     Equipment Recommendations  None recommended by PT    Recommendations for Other Services       Precautions / Restrictions Precautions Precautions: Fall Restrictions Weight Bearing Restrictions: No    Mobility  Bed Mobility Overal bed mobility: Needs Assistance Bed Mobility: Supine to Sit     Supine to sit: Min assist     General bed mobility comments: Pt showed good effort in getting to sitting, needed only light assist to scoot up to EOB  Transfers Overall transfer level: Needs assistance Equipment used: Rolling walker (2 wheeled) Transfers: Sit to/from Stand Sit to Stand: Mod assist         General transfer comment: Pt able to put some weight though LEs (R>L), but is very hesitant to do much WBing.  She needed constant assist to keep weight from falling back on bed and despite good effort needed very heavy assist to shuffle over to recliner  Ambulation/Gait             General Gait Details: unable   Stairs            Wheelchair Mobility    Modified Rankin (Stroke Patients Only)       Balance Overall  balance assessment: Needs assistance Sitting-balance support: Feet supported Sitting balance-Leahy Scale: Fair       Standing balance-Leahy Scale: Zero Standing balance comment: Even with heavy cuing to use UE on walker she needed constant assist to keep form falling back, no real ambulation.                            Cognition Arousal/Alertness: Awake/alert Behavior During Therapy: WFL for tasks assessed/performed Overall Cognitive Status: Within Functional Limits for tasks assessed                                        Exercises General Exercises - Lower Extremity Ankle Circles/Pumps: AROM;Both;15 reps;Supine Long Arc Quad: Strengthening;10 reps;AROM Heel Slides: Strengthening;10 reps;AROM Hip ABduction/ADduction: Strengthening;AROM;10 reps Hip Flexion/Marching: Strengthening;10 reps;AROM    General Comments        Pertinent Vitals/Pain Pain Assessment: 0-10 Pain Score: 8  Pain Location: b/l LEs, L>R    Home Living                      Prior Function            PT Goals (current goals can now be found in the care plan section) Acute Rehab PT Goals  PT Goal Formulation: With patient Time For Goal Achievement: 12/03/17 Potential to Achieve Goals: Fair Progress towards PT goals: Progressing toward goals    Frequency    Min 2X/week      PT Plan Current plan remains appropriate    Co-evaluation              AM-PAC PT "6 Clicks" Daily Activity  Outcome Measure  Difficulty turning over in bed (including adjusting bedclothes, sheets and blankets)?: A Little Difficulty moving from lying on back to sitting on the side of the bed? : Unable Difficulty sitting down on and standing up from a chair with arms (e.g., wheelchair, bedside commode, etc,.)?: Unable Help needed moving to and from a bed to chair (including a wheelchair)?: Total Help needed walking in hospital room?: Total Help needed climbing 3-5 steps with a  railing? : Total 6 Click Score: 8    End of Session Equipment Utilized During Treatment: Gait belt Activity Tolerance: Patient limited by pain Patient left: with chair alarm set;with call bell/phone within reach   PT Visit Diagnosis: Muscle weakness (generalized) (M62.81);Difficulty in walking, not elsewhere classified (R26.2);Pain Pain - Right/Left: Left Pain - part of body: Leg     Time: 0911-0940 PT Time Calculation (min) (ACUTE ONLY): 29 min  Charges:  $Therapeutic Exercise: 8-22 mins $Therapeutic Activity: 8-22 mins                    G Codes:  Functional Assessment Tool Used: AM-PAC 6 Clicks Basic Mobility Functional Limitation: Mobility: Walking and moving around Mobility: Walking and Moving Around Current Status (T0240): At least 80 percent but less than 100 percent impaired, limited or restricted Mobility: Walking and Moving Around Goal Status [phone]): At least 40 percent but less than 60 percent impaired, limited or restricted    Kreg Shropshire, DPT 11/19/2017, 11:35 AM

## 2017-11-19 NOTE — Clinical Social Work Note (Signed)
CSW met with patient and her son to present bed offers.  Patient and son have decided they would like to go to [facility] for short term rehab.  CSW informed [facility] that patient is requesting a private room, [facility] said they will try to accommodate patient's request.  CSW contacted [facility], and they can accept patient later in the week once she is medically ready for discharge and orders have been received.  CSW to continue to follow patient's progress throughout discharge planning.    Jones Broom. [place], MSW, [place]  11/19/2017 2:40 PM

## 2017-11-19 NOTE — Progress Notes (Deleted)
Physical Therapy Treatment Patient Details Name: Dawn [name] MRN: [id number] DOB: [birth year] Today's Date: 11/19/2017    History of Present Illness 83 y.o. female admitted for L femoral endarterectomy. Pt reports she has had multiple surgeries recently for issues and is having functional decline regarding mobility.    PT Comments    Pt did show good effort with PT session but is still very pain limited in LEs (L>R) and was unable to functionally bear weight though b/l LEs even with heavy cuing to use walker and assist to unweight/keep weight forward.  Overall pt was able to tolerate more today but is not at all close to being able to functionally walk/mobilize.     Follow Up Recommendations  SNF     Equipment Recommendations  None recommended by PT    Recommendations for Other Services       Precautions / Restrictions Precautions Precautions: Fall Restrictions Weight Bearing Restrictions: No    Mobility  Bed Mobility Overal bed mobility: Needs Assistance Bed Mobility: Supine to Sit     Supine to sit: Min assist     General bed mobility comments: Pt showed good effort in getting to sitting, needed only light assist to scoot up to EOB  Transfers Overall transfer level: Needs assistance Equipment used: Rolling walker (2 wheeled) Transfers: Sit to/from Stand Sit to Stand: Mod assist         General transfer comment: Pt able to put some weight though LEs (R>L), but is very hesitant to do much WBing.  She needed constant assist to keep weight from falling back on bed and despite good effort needed very heavy assist to shuffle over to recliner  Ambulation/Gait             General Gait Details: unable   Stairs            Wheelchair Mobility    Modified Rankin (Stroke Patients Only)       Balance Overall balance assessment: Needs assistance Sitting-balance support: Feet supported Sitting balance-Leahy Scale: Fair       Standing  balance-Leahy Scale: Zero Standing balance comment: Even with heavy cuing to use UE on walker she needed constant assist to keep form falling back, no real ambulation.                            Cognition Arousal/Alertness: Awake/alert Behavior During Therapy: WFL for tasks assessed/performed Overall Cognitive Status: Within Functional Limits for tasks assessed                                        Exercises General Exercises - Lower Extremity Ankle Circles/Pumps: AROM;Both;15 reps;Supine Long Arc Quad: Strengthening;10 reps;AROM Heel Slides: Strengthening;10 reps;AROM Hip ABduction/ADduction: Strengthening;AROM;10 reps Hip Flexion/Marching: Strengthening;10 reps;AROM    General Comments        Pertinent Vitals/Pain Pain Assessment: 0-10 Pain Score: 8  Pain Location: b/l LEs, L>R    Home Living                      Prior Function            PT Goals (current goals can now be found in the care plan section) Acute Rehab PT Goals PT Goal Formulation: With patient Time For Goal Achievement: 12/03/17 Potential to Achieve Goals: Fair Progress towards PT goals: Progressing toward  goals    Frequency    Min 2X/week      PT Plan Current plan remains appropriate    Co-evaluation              AM-PAC PT "6 Clicks" Daily Activity  Outcome Measure  Difficulty turning over in bed (including adjusting bedclothes, sheets and blankets)?: A Little Difficulty moving from lying on back to sitting on the side of the bed? : Unable Difficulty sitting down on and standing up from a chair with arms (e.g., wheelchair, bedside commode, etc,.)?: Unable Help needed moving to and from a bed to chair (including a wheelchair)?: Total Help needed walking in hospital room?: Total Help needed climbing 3-5 steps with a railing? : Total 6 Click Score: 8    End of Session Equipment Utilized During Treatment: Gait belt Activity Tolerance: Patient  limited by pain Patient left: with chair alarm set;with call bell/phone within reach   PT Visit Diagnosis: Muscle weakness (generalized) (M62.81);Difficulty in walking, not elsewhere classified (R26.2);Pain Pain - Right/Left: Left Pain - part of body: Leg     Time: 0911-0940 PT Time Calculation (min) (ACUTE ONLY): 29 min  Charges:  $Therapeutic Exercise: 8-22 mins $Therapeutic Activity: 8-22 mins                    G Codes:  Functional Assessment Tool Used: AM-PAC 6 Clicks Basic Mobility Functional Limitation: Mobility: Walking and moving around Mobility: Walking and Moving Around Current Status (K5993): At least 80 percent but less than 100 percent impaired, limited or restricted Mobility: Walking and Moving Around Goal Status [phone]): At least 40 percent but less than 60 percent impaired, limited or restricted    Kreg Shropshire, DPT 11/19/2017, 11:30 AM

## 2017-11-19 NOTE — Progress Notes (Signed)
5 Days Post-Op   Subjective/Chief Complaint: More alert today. Complains of Pain in mouth- Likely Yeast Infection as noted in her past history. Tolerated OOB to Chair yesterday.Noted pause with recovery in HR- asymptomatic.   Objective: Vital signs in last 24 hours: Temp:  [97.5 F (36.4 C)-98.5 F (36.9 C)] 98 F (36.7 C) (12/24 0735) Pulse Rate:  [70-89] 70 (12/24 0735) Resp:  [17] 17 (12/24 0303) BP: (113-161)/(48-70) 152/48 (12/24 0735) SpO2:  [93 %-98 %] 93 % (12/24 0735) Weight:  [42.7 kg (94 lb 3 oz)] 42.7 kg (94 lb 3 oz) (12/24 0301) Last BM Date: 11/18/17  Intake/Output from previous day: 12/23 0701 - 12/24 0700 In: -  Out: 200 [Urine:200] Intake/Output this shift: No intake/output data recorded.  General appearance: alert and no distress Cardio: regular rate and rhythm, S1, S2 normal, no murmur, click, rub or gallop Extremities: Warm, Incision- C/D/I, +motor/sensory  Lab Results:  Recent Labs    11/17/17 0610 11/19/17 0545  WBC 10.4 8.2  HGB 9.0* 7.8*  HCT 27.2* 23.6*  PLT 376 414   BMET Recent Labs    11/17/17 0610 11/19/17 0545  NA 128* 130*  K 3.3* 3.9  CL 90* 97*  CO2 27 25  GLUCOSE 95 100*  BUN 43* 49*  CREATININE 0.99 0.94  CALCIUM 8.8* 8.4*   PT/INR No results for input(s): LABPROT, INR in the last 72 hours. ABG No results for input(s): PHART, HCO3 in the last 72 hours.  Invalid input(s): PCO2, PO2  Studies/Results: No results found.  Anti-infectives: Anti-infectives (From admission, onward)   Start     Dose/Rate Route Frequency Ordered Stop   11/14/17 0630  ceFAZolin (ANCEF) 2 g in dextrose 5 % 100 mL IVPB     2 g 200 mL/hr over 30 Minutes Intravenous On call to O.R. 11/14/17 5697 11/14/17 0838   11/14/17 0600  ceFAZolin (ANCEF) IVPB 2g/100 mL premix  Status:  Discontinued     2 g 200 mL/hr over 30 Minutes Intravenous On call to O.R. 11/13/17 2217 11/14/17 0619   11/14/17 0032  cefUROXime (ZINACEF) 1.5 g in dextrose 5 % 50  mL IVPB  Status:  Discontinued     1.5 g 100 mL/hr over 30 Minutes Intravenous 30 min pre-op 11/14/17 0032 11/14/17 0618   11/09/17 1200  ceFAZolin (ANCEF) IVPB 2g/100 mL premix     2 g 200 mL/hr over 30 Minutes Intravenous  Once 11/09/17 1156 11/09/17 1359      Assessment/Plan: s/p Procedure(s): ENDARTERECTOMY FEMORAL (Left) Appreciate Medicine Input and care Will replace K+, check EKG, consider consulting Dr. Carlus Pavlov if any further issues with HR, OK to resume BP meds. Check CMP and recheck HgB in morning(current low HgB- no evidence of bleeding or symptoms)  Nystatin Swish and Swallow OOB to chair today  LOS: 10 days    Jamesetta So A 11/19/2017

## 2017-11-19 NOTE — Progress Notes (Signed)
Calorie Count Note  24 hour calorie count ordered.  Diet: Regular Supplements: Ensure Enlive po TID, each supplement provides 350 kcal and 20 grams of protein  Dinner 12/23: N/A (no meal ticket was collected) Breakfast 12/24: 237 kcal, 9.5 grams of protein (30% of pancakes with syrup, 100% of 8 fl oz whole milk) Lunch 12/24: 253.5 kcal, 8 grams of protein (33% of pasta with meat sauce, 100% of ice cream, 25% of milk) Supplements: 350 kcal, 20 grams of protein (1 bottle Ensure Enlive)  Total intake: 840.5 kcal (65% of minimum estimated needs)  37.5 grams of protein (58% of minimum estimated needs)  Estimated Nutritional Needs:  Kcal:  1285-1500 (30-35 kcal/kg) Protein:  65-77 grams (1.5-1.8 grams/kg) Fluid:  1.3-1.5 L/day (1 mL/kcal)  Nutrition Dx: Severe Malnutrition related to social / environmental circumstances(anorexia, early satiety) as evidenced by severe fat depletion, severe muscle depletion.  Goal: Patient will meet greater than or equal to 90% of their needs   Intervention: -Continue Ensure Enlive po TID, each supplement provides 350 kcal and 20 grams of protein -Recommend daily MVI  Willey Blade, MS, RD, LDN Office: [phone] Pager: [phone] After Hours/Weekend Pager: [phone]

## 2017-11-19 NOTE — Progress Notes (Signed)
Pt complaining that she is unable to tolerate nystatin for the yeast in her mouth. Pt's son brought what she has taken in the past at home, lactobacillus, MD paged, Dr. Manuella Ghazi gave orders to give this but to give our pharmacy's version, risaquad. Will continue to monitor. Conley Simmonds, RN, BSN

## 2017-11-19 NOTE — Progress Notes (Signed)
Pt. Slept well throughout the night with c/o pain x1, medicated with effective results. HR for the most part of the night ran SR.

## 2017-11-19 NOTE — Progress Notes (Signed)
[place] [facility]                                                                                                                                                                                  Patient Demographics   Dawn [name], is a 83 y.o. female, DOB - 1928-10-31, CHE:[id number]  Admit date - 11/09/2017   Admitting Physician Algernon Huxley, MD  Outpatient Primary MD for the patient is Idelle Crouch, MD   LOS - 10  Subjective: Pt  Feeling weak at some today    Review of Systems:   CONSTITUTIONAL: No documented fever. No fatigue, + weakness. No weight gain, no weight loss.  EYES: No blurry or double vision.  ENT: No tinnitus. No postnasal drip. No redness of the oropharynx.  RESPIRATORY: No cough, no wheeze, no hemoptysis. No dyspnea.  CARDIOVASCULAR: No chest pain. No orthopnea. No palpitations. No syncope.  GASTROINTESTINAL: No nausea, no vomiting or diarrhea. No abdominal pain. No melena or hematochezia.  GENITOURINARY: No dysuria or hematuria.  ENDOCRINE: No polyuria or nocturia. No heat or cold intolerance.  HEMATOLOGY: No anemia. No bruising. No bleeding.  INTEGUMENTARY: No rashes. No lesions.  MUSCULOSKELETAL: No arthritis. No swelling. No gout.  NEUROLOGIC: No numbness, tingling, or ataxia. No seizure-type activity.  PSYCHIATRIC: No anxiety. No insomnia. No ADD.    Vitals:   Vitals:   11/18/17 1918 11/19/17 0301 11/19/17 0303 11/19/17 0735  BP: (!) 156/70  (!) 113/53 (!) 152/48  Pulse: 85  75 70  Resp: 17  17   Temp: 98.5 F (36.9 C)  98.5 F (36.9 C) 98 F (36.7 C)  TempSrc: Oral   Oral  SpO2: 96%  94% 93%  Weight:  94 lb 3 oz (42.7 kg)    Height:        Wt Readings from Last 3 Encounters:  11/19/17 94 lb 3 oz (42.7 kg)  10/23/17 95 lb (43.1 kg)  08/07/17 100 lb (45.4 kg)     Intake/Output Summary (Last 24 hours) at 11/19/2017 1417 Last data filed at 11/19/2017 1347 Gross per 24 hour  Intake 480 ml  Output  0 ml  Net 480 ml    Physical Exam:   GENERAL: Pleasant-appearing in no apparent distress.  HEAD, EYES, EARS, NOSE AND THROAT: Atraumatic, normocephalic. Extraocular muscles are intact. Pupils equal and reactive to light. Sclerae anicteric. No conjunctival injection. No oro-pharyngeal erythema.  NECK: Supple. There is no jugular venous distention. No bruits, no lymphadenopathy, no thyromegaly.  HEART: Regular rate and rhythm,. No murmurs, no rubs, no clicks.  LUNGS: Clear to auscultation bilaterally. No rales or rhonchi. No wheezes.  ABDOMEN:  Soft, flat, nontender, nondistended. Has good bowel sounds. No hepatosplenomegaly appreciated.  EXTREMITIES: No evidence of any cyanosis, clubbing, or peripheral edema.  +2 pedal and radial pulses bilaterally.  NEUROLOGIC: The patient is alert, awake, and oriented x3 with no focal motor or sensory deficits appreciated bilaterally.  SKIN: Moist and warm with no rashes appreciated.  Psych: Not anxious, depressed LN: No inguinal LN enlargement    Antibiotics   Anti-infectives (From admission, onward)   Start     Dose/Rate Route Frequency Ordered Stop   11/14/17 0630  ceFAZolin (ANCEF) 2 g in dextrose 5 % 100 mL IVPB     2 g 200 mL/hr over 30 Minutes Intravenous On call to O.R. 11/14/17 7902 11/14/17 0838   11/14/17 0600  ceFAZolin (ANCEF) IVPB 2g/100 mL premix  Status:  Discontinued     2 g 200 mL/hr over 30 Minutes Intravenous On call to O.R. 11/13/17 2217 11/14/17 0619   11/14/17 0032  cefUROXime (ZINACEF) 1.5 g in dextrose 5 % 50 mL IVPB  Status:  Discontinued     1.5 g 100 mL/hr over 30 Minutes Intravenous 30 min pre-op 11/14/17 0032 11/14/17 0618   11/09/17 1200  ceFAZolin (ANCEF) IVPB 2g/100 mL premix     2 g 200 mL/hr over 30 Minutes Intravenous  Once 11/09/17 1156 11/09/17 1359      Medications   Scheduled Meds: . aspirin EC  81 mg Oral Daily  . atorvastatin  20 mg Oral q1800  . clopidogrel  75 mg Oral Daily  . docusate sodium   100 mg Oral BID  . feeding supplement (ENSURE ENLIVE)  237 mL Oral TID BM  . megestrol  200 mg Oral BID  . nystatin  5 mL Oral QID  . sodium chloride flush  3 mL Intravenous Q12H  . sodium chloride  1 g Oral BID WC   Continuous Infusions: PRN Meds:.acetaminophen **OR** acetaminophen, indomethacin, ondansetron **OR** ondansetron (ZOFRAN) IV, sorbitol, traMADol   Data Review:   Micro Results No results found for this or any previous visit (from the past 240 hour(s)).  Radiology Reports No results found.   CBC Recent Labs  Lab 11/13/17 1136 11/14/17 0614 11/16/17 1005 11/17/17 0610 11/19/17 0545  WBC 7.9 7.7 10.8 10.4 8.2  HGB 9.6* 9.0* 8.5* 9.0* 7.8*  HCT 29.4* 26.9* 25.7* 27.2* 23.6*  PLT 241 266 344 376 414  MCV 86.9 85.4 85.7 85.4 84.7  MCH 28.3 28.7 28.2 28.2 28.1  MCHC 32.6 33.6 32.9 33.0 33.2  RDW 14.3 14.5 14.6* 14.6* 15.0*  LYMPHSABS 1.0  --   --   --  1.1  MONOABS 0.7  --   --   --  0.7  EOSABS 0.1  --   --   --  0.2  BASOSABS 0.0  --   --   --  0.1    Chemistries  Recent Labs  Lab 11/13/17 0416 11/13/17 1136 11/16/17 1005 11/17/17 0610 11/19/17 0545  NA 131* 129* 126* 128* 130*  K 4.4 4.1 3.5 3.3* 3.9  CL 97* 95* 91* 90* 97*  CO2 26 25 23 27 25   GLUCOSE 106* 133* 164* 95 100*  BUN 37* 37* 44* 43* 49*  CREATININE 1.05* 1.04* 1.09* 0.99 0.94  CALCIUM 8.9 8.6* 8.4* 8.8* 8.4*  MG  --   --  1.8 2.1  --   AST 22  --   --   --  93*  ALT 6*  --   --   --  37  ALKPHOS 56  --   --   --  72  BILITOT 0.4  --   --   --  0.5   ------------------------------------------------------------------------------------------------------------------ estimated creatinine clearance is 28.4 mL/min (by C-G formula based on SCr of 0.94 mg/dL). ------------------------------------------------------------------------------------------------------------------ No results for input(s): HGBA1C in the last 72  hours. ------------------------------------------------------------------------------------------------------------------ No results for input(s): CHOL, HDL, LDLCALC, TRIG, CHOLHDL, LDLDIRECT in the last 72 hours. ------------------------------------------------------------------------------------------------------------------ No results for input(s): TSH, T4TOTAL, T3FREE, THYROIDAB in the last 72 hours.  Invalid input(s): FREET3 ------------------------------------------------------------------------------------------------------------------ No results for input(s): VITAMINB12, FOLATE, FERRITIN, TIBC, IRON, RETICCTPCT in the last 72 hours.  Coagulation profile Recent Labs  Lab 11/13/17 1136  INR 0.94    No results for input(s): DDIMER in the last 72 hours.  Cardiac Enzymes No results for input(s): CKMB, TROPONINI, MYOGLOBIN in the last 168 hours.  Invalid input(s): CK ------------------------------------------------------------------------------------------------------------------ Invalid input(s): POCBNP    Assessment & Plan   AAssessment/Plan:  # FTT  Continue  protein supplements to 3 times a day Consult dietitian regarding poor by mouth intake and calorie count Continue megace Patient denies any  Depression   #Hyponatremia could be secondary to hydrochlorothiazide and SIADH DC'd hydrochlorothiazide Improved salt tablete  #Hypokalemia replaced  #Hypertension elevated blood pressure And his heart rate dropped last night I will discontinue metoprolol and clonidine Use hydralazine  #Anemia likely anemia chronic disease we will check iron panel due to weakness low threshold for transfusion    #Generalized weakness Patient had endarterectomy of the lower extremity Continue incentive spirometry Encourage out of bed to chair each shift Patient will be benefited if physical therapy can work with her at least twice a day       Code Status Orders  (From  admission, onward)        Start     Ordered   11/09/17 1127  Full code  Continuous     11/09/17 1131    Code Status History    Date Active Date Inactive Code Status Order ID Comments User Context   11/09/2017 10:19 11/09/2017 11:22 Full Code [id number]  Leonides Schanz Outpatient   11/09/2017 10:19 11/09/2017 10:19 Full Code [id number]  Algernon Huxley, MD Outpatient   07/12/2017 10:05 07/12/2017 16:56 Full Code [id number]  Algernon Huxley, MD Inpatient   06/21/2017 11:25 06/21/2017 16:22 Full Code [id number]  Algernon Huxley, MD Inpatient   04/18/2017 11:50 04/18/2017 13:02 Full Code [id number]  Algernon Huxley, MD Inpatient   04/18/2017 11:04 04/18/2017 11:50 Full Code [id number]  Algernon Huxley, MD Inpatient              DVT Prophylaxis SCDs Lab Results  Component Value Date   PLT 414 11/19/2017     Time Spent in minutes   35 minutes  Greater than 50% of time spent in care coordination and counseling patient regarding the condition and plan of care.   Dustin Flock M.D on 11/19/2017 at 2:17 PM  Between 7am to 6pm - Pager - [phone]  After 6pm go to [email or web address] - password [email or web address] [facility]   Office  [phone]

## 2017-11-20 LAB — BASIC METABOLIC PANEL
Anion gap: 9 (ref 5–15)
BUN: 40 mg/dL — AB (ref 6–20)
CHLORIDE: 98 mmol/L — AB (ref 101–111)
CO2: 23 mmol/L (ref 22–32)
CREATININE: 0.79 mg/dL (ref 0.44–1.00)
Calcium: 8.4 mg/dL — ABNORMAL LOW (ref 8.9–10.3)
GFR calc non Af Amer: >60 mL/min (ref >60)
Glucose, Bld: 93 mg/dL (ref 65–99)
POTASSIUM: 4 mmol/L (ref 3.5–5.1)
Sodium: 130 mmol/L — ABNORMAL LOW (ref 135–145)

## 2017-11-20 MED ORDER — HYDRALAZINE HCL 25 MG PO TABS
25.0000 mg | ORAL_TABLET | Freq: Three times a day (TID) | ORAL | Status: DC
  Administered 2017-11-20: 25 mg via ORAL
  Filled 2017-11-20: qty 1

## 2017-11-20 MED ORDER — HYDRALAZINE HCL 50 MG PO TABS
100.0000 mg | ORAL_TABLET | Freq: Three times a day (TID) | ORAL | Status: DC
  Administered 2017-11-20 – 2017-11-22 (×7): 100 mg via ORAL
  Filled 2017-11-20 (×7): qty 2

## 2017-11-20 MED ORDER — HYDRALAZINE HCL 20 MG/ML IJ SOLN
10.0000 mg | Freq: Four times a day (QID) | INTRAMUSCULAR | Status: DC | PRN
  Administered 2017-11-20 (×2): 10 mg via INTRAVENOUS
  Filled 2017-11-20 (×2): qty 1

## 2017-11-20 MED ORDER — AMLODIPINE BESYLATE 10 MG PO TABS
10.0000 mg | ORAL_TABLET | Freq: Every day | ORAL | Status: DC
  Administered 2017-11-21 – 2017-11-22 (×2): 10 mg via ORAL
  Filled 2017-11-20 (×2): qty 1

## 2017-11-20 MED ORDER — FLUCONAZOLE IN SODIUM CHLORIDE 200-0.9 MG/100ML-% IV SOLN
200.0000 mg | Freq: Once | INTRAVENOUS | Status: AC
  Administered 2017-11-20: 200 mg via INTRAVENOUS
  Filled 2017-11-20: qty 100

## 2017-11-20 NOTE — Progress Notes (Signed)
Pt's BP elevated this morning with no PRN medications, MD paged, Dr. Estanislado Pandy to put in orders for hydralazine. Will give and continue to monitor.  Vitals:   11/20/17 0447 11/20/17 0609  BP: (!) 177/55 (!) 186/63  Pulse: 91 89  Resp: 18   Temp: 98.1 F (36.7 C)   SpO2: 97%

## 2017-11-20 NOTE — Progress Notes (Signed)
6 Days Post-Op   Subjective/Chief Complaint: Doing OK. Continues to be weak.   Objective: Vital signs in last 24 hours: Temp:  [97.8 F (36.6 C)-98.3 F (36.8 C)] 98.3 F (36.8 C) (12/25 0811) Pulse Rate:  [84-92] 87 (12/25 0811) Resp:  [16-18] 18 (12/25 0447) BP: (159-207)/(48-71) 207/71 (12/25 0811) SpO2:  [97 %-98 %] 97 % (12/25 0811) Weight:  [43.5 kg (96 lb)] 43.5 kg (96 lb) (12/25 0447) Last BM Date: 11/20/17  Intake/Output from previous day: 12/24 0701 - 12/25 0700 In: 480 [P.O.:480] Out: 500 [Urine:500] Intake/Output this shift: Total I/O In: 240 [P.O.:240] Out: -   General appearance: alert and no distress GI: soft, non-tender; bowel sounds normal; no masses,  no organomegaly Extremities: warm, faintly palpable, no edema, +motor/sensory  Lab Results:  Recent Labs    11/19/17 0545 11/19/17 1555  WBC 8.2 9.0  HGB 7.8* 8.6*  HCT 23.6* 26.3*  PLT 414 452*   BMET Recent Labs    11/19/17 1555 11/20/17 0437  NA 128* 130*  K 4.4 4.0  CL 95* 98*  CO2 25 23  GLUCOSE 128* 93  BUN 50* 40*  CREATININE 0.98 0.79  CALCIUM 8.3* 8.4*   PT/INR No results for input(s): LABPROT, INR in the last 72 hours. ABG No results for input(s): PHART, HCO3 in the last 72 hours.  Invalid input(s): PCO2, PO2  Studies/Results: No results found.  Anti-infectives: Anti-infectives (From admission, onward)   Start     Dose/Rate Route Frequency Ordered Stop   11/20/17 0930  fluconazole (DIFLUCAN) IVPB 200 mg     200 mg 100 mL/hr over 60 Minutes Intravenous  Once 11/20/17 0919     11/14/17 0630  ceFAZolin (ANCEF) 2 g in dextrose 5 % 100 mL IVPB     2 g 200 mL/hr over 30 Minutes Intravenous On call to O.R. 11/14/17 0240 11/14/17 0838   11/14/17 0600  ceFAZolin (ANCEF) IVPB 2g/100 mL premix  Status:  Discontinued     2 g 200 mL/hr over 30 Minutes Intravenous On call to O.R. 11/13/17 2217 11/14/17 0619   11/14/17 0032  cefUROXime (ZINACEF) 1.5 g in dextrose 5 % 50 mL IVPB   Status:  Discontinued     1.5 g 100 mL/hr over 30 Minutes Intravenous 30 min pre-op 11/14/17 0032 11/14/17 0618   11/09/17 1200  ceFAZolin (ANCEF) IVPB 2g/100 mL premix     2 g 200 mL/hr over 30 Minutes Intravenous  Once 11/09/17 1156 11/09/17 1359      Assessment/Plan: s/p Procedure(s): ENDARTERECTOMY FEMORAL (Left) Ramona and care  Continued failure to thrive BP control Encourage PO OOB as tolerated with PT  LOS: 11 days    Jamesetta So A 11/20/2017

## 2017-11-20 NOTE — Progress Notes (Addendum)
[place] at [facility]                                                                                                                                                                                  Patient Demographics   Dawn [name], is a 83 y.o. female, DOB - 1928/10/22, EVO:[id number]  Admit date - 11/09/2017   Admitting Physician Algernon Huxley, MD  Outpatient Primary MD for the patient is Idelle Crouch, MD   LOS - 11  Subjective: Continues to be very weak she had episode of choking with doing nystatin swish and swallow her son does not want her to get that she is eating better    Review of Systems:   CONSTITUTIONAL: No documented fever. No fatigue, + weakness. No weight gain, no weight loss.  EYES: No blurry or double vision.  ENT: No tinnitus. No postnasal drip. No redness of the oropharynx.  RESPIRATORY: No cough, no wheeze, no hemoptysis. No dyspnea.  CARDIOVASCULAR: No chest pain. No orthopnea. No palpitations. No syncope.  GASTROINTESTINAL: No nausea, no vomiting or diarrhea. No abdominal pain. No melena or hematochezia.  GENITOURINARY: No dysuria or hematuria.  ENDOCRINE: No polyuria or nocturia. No heat or cold intolerance.  HEMATOLOGY: No anemia. No bruising. No bleeding.  INTEGUMENTARY: No rashes. No lesions.  MUSCULOSKELETAL: No arthritis. No swelling. No gout.  NEUROLOGIC: No numbness, tingling, or ataxia. No seizure-type activity.  PSYCHIATRIC: No anxiety. No insomnia. No ADD.    Vitals:   Vitals:   11/20/17 0609 11/20/17 0811 11/20/17 1112 11/20/17 1237  BP: (!) 186/63 (!) 207/71 (!) 189/62 (!) 180/56  Pulse: 89 87 89 96  Resp:      Temp:  98.3 F (36.8 C)  98.1 F (36.7 C)  TempSrc:    Oral  SpO2:  97% 98% 98%  Weight:      Height:        Wt Readings from Last 3 Encounters:  11/20/17 96 lb (43.5 kg)  10/23/17 95 lb (43.1 kg)  08/07/17 100 lb (45.4 kg)     Intake/Output Summary (Last 24 hours) at 11/20/2017  1301 Last data filed at 11/20/2017 1217 Gross per 24 hour  Intake 480 ml  Output 801 ml  Net -321 ml    Physical Exam:   GENERAL: Pleasant-appearing in no apparent distress.  Chronically ill-appearing week HEAD, EYES, EARS, NOSE AND THROAT: Atraumatic, normocephalic. Extraocular muscles are intact. Pupils equal and reactive to light. Sclerae anicteric. No conjunctival injection. No oro-pharyngeal erythema.  NECK: Supple. There is no jugular venous distention. No bruits, no lymphadenopathy, no thyromegaly.  HEART: Regular rate and rhythm,. No murmurs, no rubs,  no clicks.  LUNGS: Clear to auscultation bilaterally. No rales or rhonchi. No wheezes.  ABDOMEN: Soft, flat, nontender, nondistended. Has good bowel sounds. No hepatosplenomegaly appreciated.  EXTREMITIES: No evidence of any cyanosis, clubbing, or peripheral edema.  +2 pedal and radial pulses bilaterally.  NEUROLOGIC: The patient is alert, awake, and oriented x3 with no focal motor or sensory deficits appreciated bilaterally.  SKIN: Moist and warm with no rashes appreciated.  Psych: Not anxious, depressed LN: No inguinal LN enlargement    Antibiotics   Anti-infectives (From admission, onward)   Start     Dose/Rate Route Frequency Ordered Stop   11/20/17 0930  fluconazole (DIFLUCAN) IVPB 200 mg     200 mg 100 mL/hr over 60 Minutes Intravenous  Once 11/20/17 0919 11/20/17 1156   11/14/17 0630  ceFAZolin (ANCEF) 2 g in dextrose 5 % 100 mL IVPB     2 g 200 mL/hr over 30 Minutes Intravenous On call to O.R. 11/14/17 0258 11/14/17 0838   11/14/17 0600  ceFAZolin (ANCEF) IVPB 2g/100 mL premix  Status:  Discontinued     2 g 200 mL/hr over 30 Minutes Intravenous On call to O.R. 11/13/17 2217 11/14/17 0619   11/14/17 0032  cefUROXime (ZINACEF) 1.5 g in dextrose 5 % 50 mL IVPB  Status:  Discontinued     1.5 g 100 mL/hr over 30 Minutes Intravenous 30 min pre-op 11/14/17 0032 11/14/17 0618   11/09/17 1200  ceFAZolin (ANCEF) IVPB 2g/100  mL premix     2 g 200 mL/hr over 30 Minutes Intravenous  Once 11/09/17 1156 11/09/17 1359      Medications   Scheduled Meds: . acidophilus  1 capsule Oral Daily  . [START ON 11/21/2017] amLODipine  10 mg Oral Daily  . aspirin EC  81 mg Oral Daily  . atorvastatin  20 mg Oral q1800  . clopidogrel  75 mg Oral Daily  . docusate sodium  100 mg Oral BID  . feeding supplement (ENSURE ENLIVE)  237 mL Oral TID BM  . hydrALAZINE  100 mg Oral Q8H  . megestrol  200 mg Oral BID  . sodium chloride flush  3 mL Intravenous Q12H  . sodium chloride  1 g Oral BID WC   Continuous Infusions: PRN Meds:.acetaminophen **OR** acetaminophen, guaiFENesin-dextromethorphan, hydrALAZINE, indomethacin, ondansetron **OR** ondansetron (ZOFRAN) IV, sorbitol, traMADol   Data Review:   Micro Results No results found for this or any previous visit (from the past 240 hour(s)).  Radiology Reports No results found.   CBC Recent Labs  Lab 11/14/17 0614 11/16/17 1005 11/17/17 0610 11/19/17 0545 11/19/17 1555  WBC 7.7 10.8 10.4 8.2 9.0  HGB 9.0* 8.5* 9.0* 7.8* 8.6*  HCT 26.9* 25.7* 27.2* 23.6* 26.3*  PLT 266 344 376 414 452*  MCV 85.4 85.7 85.4 84.7 85.4  MCH 28.7 28.2 28.2 28.1 27.9  MCHC 33.6 32.9 33.0 33.2 32.7  RDW 14.5 14.6* 14.6* 15.0* 15.2*  LYMPHSABS  --   --   --  1.1  --   MONOABS  --   --   --  0.7  --   EOSABS  --   --   --  0.2  --   BASOSABS  --   --   --  0.1  --     Chemistries  Recent Labs  Lab 11/16/17 1005 11/17/17 0610 11/19/17 0545 11/19/17 1555 11/20/17 0437  NA 126* 128* 130* 128* 130*  K 3.5 3.3* 3.9 4.4 4.0  CL 91* 90* 97*  95* 98*  CO2 23 27 25 25 23   GLUCOSE 164* 95 100* 128* 93  BUN 44* 43* 49* 50* 40*  CREATININE 1.09* 0.99 0.94 0.98 0.79  CALCIUM 8.4* 8.8* 8.4* 8.3* 8.4*  MG 1.8 2.1  --   --   --   AST  --   --  93*  --   --   ALT  --   --  37  --   --   ALKPHOS  --   --  72  --   --   BILITOT  --   --  0.5  --   --     ------------------------------------------------------------------------------------------------------------------ estimated creatinine clearance is 34 mL/min (by C-G formula based on SCr of 0.79 mg/dL). ------------------------------------------------------------------------------------------------------------------ No results for input(s): HGBA1C in the last 72 hours. ------------------------------------------------------------------------------------------------------------------ No results for input(s): CHOL, HDL, LDLCALC, TRIG, CHOLHDL, LDLDIRECT in the last 72 hours. ------------------------------------------------------------------------------------------------------------------ No results for input(s): TSH, T4TOTAL, T3FREE, THYROIDAB in the last 72 hours.  Invalid input(s): FREET3 ------------------------------------------------------------------------------------------------------------------ Recent Labs    11/19/17 0545 11/19/17 0549  VITAMINB12  --  1,336*  FOLATE 13.8  --   FERRITIN 129  --   TIBC 167*  --   IRON 11*  --   RETICCTPCT 1.2  --     Coagulation profile No results for input(s): INR, PROTIME in the last 168 hours.  No results for input(s): DDIMER in the last 72 hours.  Cardiac Enzymes No results for input(s): CKMB, TROPONINI, MYOGLOBIN in the last 168 hours.  Invalid input(s): CK ------------------------------------------------------------------------------------------------------------------ Invalid input(s): POCBNP    Assessment & Plan   AAssessment/Plan:  # FTT  Continue  protein supplements 3 times a day Calorie count being done Continue megace Patient denies any  Depression  #Oral thrush give a dose of fluconazole unable to do the nystatin swish and swallow  #Hyponatremia could be secondary to hydrochlorothiazide and SIADH Hydrochlorothiazide has been discontinued Improved salt tablete  #Hypokalemia replaced  #Hypertension  elevated blood pressure Due to bradycardia clonidine and metoprolol have been discontinued Patient on higher dose hydralazine, increase Norvasc dose to 10 mg  #Anemia likely anemia chronic disease repeat CBC in the morning   #Generalized weakness Patient had endarterectomy of the lower extremity Continue incentive spirometry Encourage out of bed to chair each shift Speech eval According to primary team patient's family not interested in palliative care team      Code Status Orders  (From admission, onward)        Start     Ordered   11/09/17 1127  Full code  Continuous     11/09/17 1131    Code Status History    Date Active Date Inactive Code Status Order ID Comments User Context   11/09/2017 10:19 11/09/2017 11:22 Full Code [id number]  Leonides Schanz Outpatient   11/09/2017 10:19 11/09/2017 10:19 Full Code [id number]  Algernon Huxley, MD Outpatient   07/12/2017 10:05 07/12/2017 16:56 Full Code [id number]  Algernon Huxley, MD Inpatient   06/21/2017 11:25 06/21/2017 16:22 Full Code [id number]  Algernon Huxley, MD Inpatient   04/18/2017 11:50 04/18/2017 13:02 Full Code [id number]  Algernon Huxley, MD Inpatient   04/18/2017 11:04 04/18/2017 11:50 Full Code [id number]  Algernon Huxley, MD Inpatient              DVT Prophylaxis SCDs Lab Results  Component Value Date   PLT 452 (H) 11/19/2017     Time Spent in minutes   32 minutes  Greater than 50% of time spent in care coordination and counseling patient regarding the condition and plan of care.   Dustin Flock M.D on 11/20/2017 at 1:01 PM  Between 7am to 6pm - Pager - [phone]  After 6pm go to [email or web address] - password [email or web address] [place] [facility]   Office  [phone]

## 2017-11-21 NOTE — Progress Notes (Signed)
[place] at [facility]                                                                                                                                                                                  Patient Demographics   Dawn [name], is a 83 y.o. female, DOB - June 09, 1928, TMH:[id number]  Admit date - 11/09/2017   Admitting Physician Algernon Huxley, MD  Outpatient Primary MD for the patient is Idelle Crouch, MD   LOS - 12  Subjective: Says that she is feeling very weak.  Review of Systems:   CONSTITUTIONAL: No documented fever. No fatigue, + weakness. No weight gain, no weight loss.  EYES: No blurry or double vision.  ENT: No tinnitus. No postnasal drip. No redness of the oropharynx.  RESPIRATORY: No cough, no wheeze, no hemoptysis. No dyspnea.  CARDIOVASCULAR: No chest pain. No orthopnea. No palpitations. No syncope.  GASTROINTESTINAL: No nausea, no vomiting or diarrhea. No abdominal pain. No melena or hematochezia.  GENITOURINARY: No dysuria or hematuria.  ENDOCRINE: No polyuria or nocturia. No heat or cold intolerance.  HEMATOLOGY: No anemia. No bruising. No bleeding.  INTEGUMENTARY: No rashes. No lesions.  MUSCULOSKELETAL: No arthritis. No swelling. No gout.  NEUROLOGIC: No numbness, tingling, or ataxia. No seizure-type activity.  PSYCHIATRIC: No anxiety. No insomnia. No ADD.    Vitals:   Vitals:   11/20/17 1923 11/21/17 0434 11/21/17 0645 11/21/17 0813  BP: (!) 164/52 (!) 188/58 (!) 173/75 (!) 181/60  Pulse: (!) 107 97 98 (!) 101  Resp:  18  16  Temp: 98.1 F (36.7 C) 97.8 F (36.6 C)    TempSrc: Oral Oral    SpO2: 96% 96%  99%  Weight:  42.8 kg (94 lb 6.4 oz)    Height:        Wt Readings from Last 3 Encounters:  11/21/17 42.8 kg (94 lb 6.4 oz)  10/23/17 43.1 kg (95 lb)  08/07/17 45.4 kg (100 lb)     Intake/Output Summary (Last 24 hours) at 11/21/2017 1236 Last data filed at 11/21/2017 1046 Gross per 24 hour  Intake 360  ml  Output 0 ml  Net 360 ml    Physical Exam:   GENERAL: Pleasant-appearing in no apparent distress.  Chronically ill-appearing week HEAD, EYES, EARS, NOSE AND THROAT: Atraumatic, normocephalic. Extraocular muscles are intact. Pupils equal and reactive to light. Sclerae anicteric. No conjunctival injection. No oro-pharyngeal erythema.  NECK: Supple. There is no jugular venous distention. No bruits, no lymphadenopathy, no thyromegaly.  HEART: Regular rate and rhythm,. No murmurs, no rubs, no clicks.  LUNGS: Clear to auscultation bilaterally. No rales or rhonchi. No wheezes.  ABDOMEN: Soft, flat, nontender, nondistended. Has good bowel sounds. No hepatosplenomegaly appreciated.  EXTREMITIES: No evidence of any cyanosis, clubbing, or peripheral edema.  +2 pedal and radial pulses bilaterally.  NEUROLOGIC: The patient is alert, awake, and oriented x3 with no focal motor or sensory deficits appreciated bilaterally.  SKIN: Moist and warm with no rashes appreciated.  Psych: Not anxious, depressed LN: No inguinal LN enlargement    Antibiotics   Anti-infectives (From admission, onward)   Start     Dose/Rate Route Frequency Ordered Stop   11/20/17 0930  fluconazole (DIFLUCAN) IVPB 200 mg     200 mg 100 mL/hr over 60 Minutes Intravenous  Once 11/20/17 0919 11/20/17 1156   11/14/17 0630  ceFAZolin (ANCEF) 2 g in dextrose 5 % 100 mL IVPB     2 g 200 mL/hr over 30 Minutes Intravenous On call to O.R. 11/14/17 7672 11/14/17 0838   11/14/17 0600  ceFAZolin (ANCEF) IVPB 2g/100 mL premix  Status:  Discontinued     2 g 200 mL/hr over 30 Minutes Intravenous On call to O.R. 11/13/17 2217 11/14/17 0619   11/14/17 0032  cefUROXime (ZINACEF) 1.5 g in dextrose 5 % 50 mL IVPB  Status:  Discontinued     1.5 g 100 mL/hr over 30 Minutes Intravenous 30 min pre-op 11/14/17 0032 11/14/17 0618   11/09/17 1200  ceFAZolin (ANCEF) IVPB 2g/100 mL premix     2 g 200 mL/hr over 30 Minutes Intravenous  Once 11/09/17  1156 11/09/17 1359      Medications   Scheduled Meds: . acidophilus  1 capsule Oral Daily  . amLODipine  10 mg Oral Daily  . aspirin EC  81 mg Oral Daily  . atorvastatin  20 mg Oral q1800  . clopidogrel  75 mg Oral Daily  . docusate sodium  100 mg Oral BID  . feeding supplement (ENSURE ENLIVE)  237 mL Oral TID BM  . hydrALAZINE  100 mg Oral Q8H  . megestrol  200 mg Oral BID  . sodium chloride flush  3 mL Intravenous Q12H  . sodium chloride  1 g Oral BID WC   Continuous Infusions: PRN Meds:.acetaminophen **OR** acetaminophen, guaiFENesin-dextromethorphan, hydrALAZINE, indomethacin, ondansetron **OR** ondansetron (ZOFRAN) IV, sorbitol, traMADol   Data Review:   Micro Results No results found for this or any previous visit (from the past 240 hour(s)).  Radiology Reports No results found.   CBC Recent Labs  Lab 11/16/17 1005 11/17/17 0610 11/19/17 0545 11/19/17 1555  WBC 10.8 10.4 8.2 9.0  HGB 8.5* 9.0* 7.8* 8.6*  HCT 25.7* 27.2* 23.6* 26.3*  PLT 344 376 414 452*  MCV 85.7 85.4 84.7 85.4  MCH 28.2 28.2 28.1 27.9  MCHC 32.9 33.0 33.2 32.7  RDW 14.6* 14.6* 15.0* 15.2*  LYMPHSABS  --   --  1.1  --   MONOABS  --   --  0.7  --   EOSABS  --   --  0.2  --   BASOSABS  --   --  0.1  --     Chemistries  Recent Labs  Lab 11/16/17 1005 11/17/17 0610 11/19/17 0545 11/19/17 1555 11/20/17 0437  NA 126* 128* 130* 128* 130*  K 3.5 3.3* 3.9 4.4 4.0  CL 91* 90* 97* 95* 98*  CO2 23 27 25 25 23   GLUCOSE 164* 95 100* 128* 93  BUN 44* 43* 49* 50* 40*  CREATININE 1.09* 0.99 0.94 0.98 0.79  CALCIUM 8.4* 8.8* 8.4* 8.3* 8.4*  MG 1.8  2.1  --   --   --   AST  --   --  93*  --   --   ALT  --   --  37  --   --   ALKPHOS  --   --  72  --   --   BILITOT  --   --  0.5  --   --    ------------------------------------------------------------------------------------------------------------------ estimated creatinine clearance is 33.5 mL/min (by C-G formula based on SCr of 0.79  mg/dL). ------------------------------------------------------------------------------------------------------------------ No results for input(s): HGBA1C in the last 72 hours. ------------------------------------------------------------------------------------------------------------------ No results for input(s): CHOL, HDL, LDLCALC, TRIG, CHOLHDL, LDLDIRECT in the last 72 hours. ------------------------------------------------------------------------------------------------------------------ No results for input(s): TSH, T4TOTAL, T3FREE, THYROIDAB in the last 72 hours.  Invalid input(s): FREET3 ------------------------------------------------------------------------------------------------------------------ Recent Labs    11/19/17 0545 11/19/17 0549  VITAMINB12  --  1,336*  FOLATE 13.8  --   FERRITIN 129  --   TIBC 167*  --   IRON 11*  --   RETICCTPCT 1.2  --     Coagulation profile No results for input(s): INR, PROTIME in the last 168 hours.  No results for input(s): DDIMER in the last 72 hours.  Cardiac Enzymes No results for input(s): CKMB, TROPONINI, MYOGLOBIN in the last 168 hours.  Invalid input(s): CK ------------------------------------------------------------------------------------------------------------------ Invalid input(s): POCBNP    Assessment & Plan   AAssessment/Plan:  # FTT  Continue  protein supplements 3 times a day Calorie count being done Continue megace Patient denies any  Depression  ##Hyponatremia could be secondary to hydrochlorothiazide and SIADH Hydrochlorothiazide has been discontinued Hyponatremia improved.  #Hypokalemia replaced  #Hypertension elevated blood pressure Due to bradycardia clonidine and metoprolol have been discontinued Not tolerating hydralazing  #Anemia likely anemia chronic disease repeat CBC in the morning   #Generalized weakness Patient had endarterectomy of the lower extremity Continue incentive  spirometry Encourage out of bed to chair each shift Speech eval       Code Status Orders  (From admission, onward)        Start     Ordered   11/09/17 1127  Full code  Continuous     11/09/17 1131    Code Status History    Date Active Date Inactive Code Status Order ID Comments User Context   11/09/2017 10:19 11/09/2017 11:22 Full Code [id number]  Leonides Schanz Outpatient   11/09/2017 10:19 11/09/2017 10:19 Full Code [id number]  Algernon Huxley, MD Outpatient   07/12/2017 10:05 07/12/2017 16:56 Full Code [id number]  Algernon Huxley, MD Inpatient   06/21/2017 11:25 06/21/2017 16:22 Full Code [id number]  Algernon Huxley, MD Inpatient   04/18/2017 11:50 04/18/2017 13:02 Full Code [id number]  Algernon Huxley, MD Inpatient   04/18/2017 11:04 04/18/2017 11:50 Full Code [id number]  Algernon Huxley, MD Inpatient              DVT Prophylaxis SCDs Lab Results  Component Value Date   PLT 452 (H) 11/19/2017     Time Spent in minutes   32 minutes  Greater than 50% of time spent in care coordination and counseling patient regarding the condition and plan of care.   Epifanio Lesches M.D on 11/21/2017 at 12:36 PM  Between 7am to 6pm - Pager - [phone]  After 6pm go to [email or web address] - password [email or web address] [place] [facility]   Office  [phone]

## 2017-11-21 NOTE — Plan of Care (Signed)
  Progressing Pain Managment: General experience of comfort will improve 11/21/2017 0004 - Progressing by Loran Senters, RN Safety: Ability to remain free from injury will improve 11/21/2017 0004 - Progressing by Loran Senters, RN Skin Integrity: Risk for impaired skin integrity will decrease 11/21/2017 0004 - Progressing by Loran Senters, RN   Not Progressing Nutrition: Adequate nutrition will be maintained 11/21/2017 0004 - Not Progressing by Loran Senters, RN Note Patient still has poor intake. Ensures given, less than 50% consumed. Will continue to encourage PO intake.

## 2017-11-21 NOTE — Progress Notes (Signed)
[facility]  Daily Progress Note   Subjective  - 7 Days Post-Op  More alert today.  Patient and son reports adverse reaction to hydralazine and asks if we can stop that. No major events.  Ate a little better  Objective Vitals:   11/20/17 1923 11/21/17 0434 11/21/17 0645 11/21/17 0813  BP: (!) 164/52 (!) 188/58 (!) 173/75 (!) 181/60  Pulse: (!) 107 97 98 (!) 101  Resp:  18  16  Temp: 98.1 F (36.7 C) 97.8 F (36.6 C)    TempSrc: Oral Oral    SpO2: 96% 96%  99%  Weight:  42.8 kg (94 lb 6.4 oz)    Height:        Intake/Output Summary (Last 24 hours) at 11/21/2017 1126 Last data filed at 11/21/2017 1046 Gross per 24 hour  Intake 360 ml  Output 301 ml  Net 59 ml    PULM  CTAB CV  RRR VASC  Pedal pulses present bilaterally.  Feet warm  Laboratory CBC    Component Value Date/Time   WBC 9.0 11/19/2017 1555   HGB 8.6 (L) 11/19/2017 1555   HCT 26.3 (L) 11/19/2017 1555   PLT 452 (H) 11/19/2017 1555    BMET    Component Value Date/Time   NA 130 (L) 11/20/2017 0437   K 4.0 11/20/2017 0437   CL 98 (L) 11/20/2017 0437   CO2 23 11/20/2017 0437   GLUCOSE 93 11/20/2017 0437   BUN 40 (H) 11/20/2017 0437   CREATININE 0.79 11/20/2017 0437   CREATININE 0.92 03/13/2012 0805   CALCIUM 8.4 (L) 11/20/2017 0437   GFRNONAA >60 11/20/2017 0437   GFRNONAA 58 (L) 03/13/2012 0805   GFRAA >60 11/20/2017 0437   GFRAA >60 03/13/2012 0805    Assessment/Planning: POD #12 s/p left femoral endarterectomy   More alert today  Son reports adverse reaction to hydralazine.  Will stop that.    Will need rehab   Vascular status is currently good    Leotis Pain  11/21/2017, 11:26 AM

## 2017-11-21 NOTE — Evaluation (Addendum)
Clinical/Bedside Swallow Evaluation Patient Details  Name: Dawn [name] MRN: [id number] Date of Birth: [birth year]  Today's Date: 11/21/2017 Time: SLP Start Time (ACUTE ONLY): 1200 SLP Stop Time (ACUTE ONLY): 1300 SLP Time Calculation (min) (ACUTE ONLY): 60 min  Past Medical History:  Past Medical History:  Diagnosis Date  . Cancer ([place])    skin cancer  . 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 Hypertension   . Sciatica   . Sciatica    Past Surgical History:  Past Surgical History:  Procedure Laterality Date  . ABDOMINAL HYSTERECTOMY    . APPENDECTOMY    . CATARACT EXTRACTION, BILATERAL    . CHOLECYSTECTOMY    . ENDARTERECTOMY FEMORAL Right 04/19/2017   Procedure: ENDARTERECTOMY COMMON FEMORAL ARTERY, PROFUNDA  FEMORIS ARTERY, and  SUPERFICIAL FEMORAL ARTERY;  Surgeon: Algernon Huxley, MD;  Location: [facility];  Service: Vascular;  Laterality: Right;  . ENDARTERECTOMY FEMORAL Left 11/14/2017   Procedure: ENDARTERECTOMY FEMORAL;  Surgeon: Algernon Huxley, MD;  Location: [facility];  Service: Vascular;  Laterality: Left;  . HIP SURGERY Bilateral   . LOWER EXTREMITY ANGIOGRAM Right 04/19/2017   Procedure: LOWER EXTREMITY ANGIOGRAM WITH SUPERFICIAL FEMORAL ARTERY STENT PLACEMENT;  Surgeon: Algernon Huxley, MD;  Location: [facility];  Service: Vascular;  Laterality: Right;  . LOWER EXTREMITY ANGIOGRAPHY Right 04/18/2017   Procedure: Lower Extremity Angiography;  Surgeon: Algernon Huxley, MD;  Location: [place] [facility];  Service: Cardiovascular;  Laterality: Right;  . LOWER EXTREMITY ANGIOGRAPHY Left 07/12/2017   Procedure: Lower Extremity Angiography;  Surgeon: Algernon Huxley, MD;  Location: [place] [facility];  Service: Cardiovascular;  Laterality: Left;  . LOWER EXTREMITY ANGIOGRAPHY Left 11/09/2017   Procedure: LOWER EXTREMITY ANGIOGRAPHY;  Surgeon: Algernon Huxley, MD;  Location: [place] [facility];  Service: Cardiovascular;  Laterality:  Left;  . LOWER EXTREMITY INTERVENTION  07/12/2017   Procedure: Lower Extremity Intervention;  Surgeon: Algernon Huxley, MD;  Location: [place] [facility];  Service: Cardiovascular;;  . LOWER EXTREMITY INTERVENTION  11/09/2017   Procedure: LOWER EXTREMITY INTERVENTION;  Surgeon: Algernon Huxley, MD;  Location: [place] [facility];  Service: Cardiovascular;;  . RENAL ANGIOGRAPHY N/A 06/21/2017   Procedure: Renal Angiography;  Surgeon: Algernon Huxley, MD;  Location: [place] [facility];  Service: Cardiovascular;  Laterality: N/A;  . SHOULDER SURGERY Bilateral   . TOTAL HIP ARTHROPLASTY Bilateral    HPI:  Pt is a 83 y.o. female who presents with an ischemic leg and needs an urgent procedure performed.  The nurses have been unable to gain a stable peripheral IV and clearly for her angiogram she will have a need for venous access.  Central venous catheter was placed for stable venous access for her procedure concomitant to her angiogram with her groin already prepped.  The patient is aware the risks of central venous catheter placement include but are not limited to: bleeding, infection, central venous injury, pneumothorax, possible venous stenosis, possible malpositioning in the venous system, and possible infections related to long-term catheter presence. Pt has had surgical intervention from vascular surgery during this admission; LT leg angio - prolonged recovery w/ FTT per MD.  Assessment / Plan / Recommendation Clinical Impression  Pt appears to present w/ adequate oropharyngeal phase swallow function w/ no immediate, overt pharyngeal phase deficits noted; min increased oral phase time for mastication and requested foods(fruit) be cut small for easier mastication. Pt  consumed trials of thin liquids via cup/straw, pureese, and minced/broken-down mech soft foods w/ no overt s/s of aspiration noted; no decline in vocal quality or respiratory status during/post intake except w/ trial of thin liquid x1 - pt  attempted talking immediately after swallowing. When pt drank other thin liquid trials, no further wet vocal quality noted when instructed to not talk immmediately after and to take time to swallow completely to clear after drinking. During the oral phase, she exhibited adequate motor function and control of boluses but needed min extra time for full mastication of increased texture and prefers foods cut well. Pt fed self w/ tray setup. Recommend a mech soft diet w/ minced meats(if preferred), moistened foods; aspiration precautions; Pills in Puree if needed for easier, safer swallowing w/ NSG. No further skilled ST services indicated at this time as pt appears at her baseline. NSG to reconsult if any decline in status while admitted.  SLP Visit Diagnosis: Dysphagia, unspecified (R13.10)    Aspiration Risk  (reduced following aspiration precautions)    Diet Recommendation  Dysphagia level 3(mech soft w/ minced meats if needed for easier mastication); thin liquids - monitor any straw use. Recommend general aspiration precautions and reduce distractions, talking during meals  Medication Administration: Whole meds with puree(for safer swallowing as needed vs w/ liquids)    Other  Recommendations Recommended Consults: (Dietician f/u) Oral Care Recommendations: Oral care BID;Staff/trained caregiver to provide oral care;Patient independent with oral care Other Recommendations: (n/a)   Follow up Recommendations None      Frequency and Duration (n/a)  (n/a)       Prognosis Prognosis for Safe Diet Advancement: Good      Swallow Study   General Date of Onset: 11/09/17 HPI: Pt is a 83 y.o. female who presents with an ischemic leg and needs an urgent procedure performed.  The nurses have been unable to gain a stable peripheral IV and clearly for her angiogram she will have a need for venous access.  Central venous catheter was placed for stable venous access for her procedure concomitant to her  angiogram with her groin already prepped.  The patient is aware the risks of central venous catheter placement include but are not limited to: bleeding, infection, central venous injury, pneumothorax, possible venous stenosis, possible malpositioning in the venous system, and possible infections related to long-term catheter presence.  Type of Study: Bedside Swallow Evaluation Previous Swallow Assessment: none indicated Diet Prior to this Study: Regular;Thin liquids Temperature Spikes Noted: No(wbc 9.0) Respiratory Status: Room air History of Recent Intubation: No Behavior/Cognition: Alert;Cooperative;Pleasant mood Oral Cavity Assessment: Within Functional Limits Oral Care Completed by SLP: Recent completion by staff Oral Cavity - Dentition: Adequate natural dentition Vision: Functional for self-feeding Self-Feeding Abilities: Able to feed self;Needs set up;Needs assist Patient Positioning: Upright in chair Baseline Vocal Quality: Normal Volitional Cough: Strong(min congested) Volitional Swallow: Able to elicit    Oral/Motor/Sensory Function Overall Oral Motor/Sensory Function: Within functional limits   Ice Chips Ice chips: Within functional limits Presentation: Spoon;Self Fed(2 trials)   Thin Liquid Thin Liquid: Within functional limits Presentation: Cup;Self Fed;Straw(4 trials each) Oral Phase Impairments: (none) Oral Phase Functional Implications: (none) Pharyngeal  Phase Impairments: Wet Vocal Quality(x1 when she spoke immediately after swallowing ) Other Comments: no further wet vocal quality noted when instructed to not talk immmediately after and to take time to swallow completely to clear after drinking    Nectar Thick Nectar Thick Liquid: Not tested   Honey Thick Honey Thick Liquid:  Not tested   Puree Puree: Within functional limits Presentation: Self Fed;Spoon(x4 trials)   Solid   GO   Solid: Impaired Presentation: Self Fed;Spoon(x8 trials) Oral Phase Impairments:  Impaired mastication(requested foods be cut small) Oral Phase Functional Implications: Impaired mastication Pharyngeal Phase Impairments: (none)    Functional Assessment Tool Used: clinical judgement Functional Limitations: Swallowing Swallow Current Status (U0454): At least 1 percent but less than 20 percent impaired, limited or restricted Swallow Goal Status [phone]): At least 1 percent but less than 20 percent impaired, limited or restricted Swallow Discharge Status [phone]): At least 1 percent but less than 20 percent impaired, limited or restricted     Orinda Kenner, MS, CCC-SLP Watson,Katherine 11/21/2017,1:49 PM

## 2017-11-21 NOTE — Care Management (Signed)
Vascular  documentation appears that patient vascular issues are stable.  Patient's calorie count is no longer in progress. Started on megace 11/18/2017.    Have reached out to attending to discuss anticipated discharge date. Has been evaluated by SLP and there are no swallowing issues.

## 2017-11-21 NOTE — Care Management Important Message (Signed)
Important Message  Patient Details  Name: Dawn [name] MRN: [id number] Date of Birth: [birth year]   Medicare Important Message Given:  Yes    Katrina Stack, RN 11/21/2017, 4:35 PM

## 2017-11-22 MED ORDER — RISAQUAD PO CAPS: 1.0000 | ORAL_CAPSULE | Freq: Every day | ORAL | 3 refills | Status: DC

## 2017-11-22 MED ORDER — AMLODIPINE BESYLATE 10 MG PO TABS: 10.0000 mg | ORAL_TABLET | Freq: Every day | ORAL | 3 refills | Status: DC

## 2017-11-22 NOTE — Plan of Care (Signed)
  Progressing Education: Knowledge of General Education information will improve 11/22/2017 0102 - Progressing by Loran Senters, RN Pain Managment: General experience of comfort will improve 11/22/2017 0102 - Progressing by Loran Senters, RN Safety: Ability to remain free from injury will improve 11/22/2017 0102 - Progressing by Loran Senters, RN

## 2017-11-22 NOTE — Clinical Social Work Placement (Signed)
   CLINICAL SOCIAL WORK PLACEMENT  NOTE  Date:  11/22/2017  Patient Details  Name: Fendi Meinhardt MRN: [id number] Date of Birth: 06/26/1928  Clinical Social Work is seeking post-discharge placement for this patient at the [place] level of care (*CSW will initial, date and re-position this form in  chart as items are completed):  Yes   Patient/family provided with [facility] Work Department's list of facilities offering this level of care within the geographic area requested by the patient (or if unable, by the patient's family).  Yes   Patient/family informed of their freedom to choose among providers that offer the needed level of care, that participate in Medicare, Medicaid or managed care program needed by the patient, have an available bed and are willing to accept the patient.  Yes   Patient/family informed of [facility] ownership interest in [facility] and [facility], as well as of the fact that they are under no obligation to receive care at these facilities.  PASRR submitted to EDS on 11/16/17     PASRR number received on       Existing PASRR number confirmed on 11/16/17     FL2 transmitted to all facilities in geographic area requested by pt/family on 11/16/17     FL2 transmitted to all facilities within larger geographic area on       Patient informed that his/her managed care company has contracts with or will negotiate with certain facilities, including the following:        Yes   Patient/family informed of bed offers received.  Patient chooses bed at [facility]     Physician recommends and patient chooses bed at      Patient to be transferred to [facility] on 11/22/17.  Patient to be transferred to facility by [facility]     Patient family notified on 11/22/17 of transfer.  Name of family member notified:  Patient had a family member at bedside, patient also was going to notify  her son.     PHYSICIAN Please sign FL2     Additional Comment:    _______________________________________________ Ross Ludwig, LCSWA 11/22/2017, 6:50 PM

## 2017-11-22 NOTE — Progress Notes (Signed)
Report called to [facility]/ EMS called to transport pt to SNF

## 2017-11-22 NOTE — Clinical Social Work Note (Signed)
Patient to be d/c'ed today to [facility].   Patient and family agreeable to plans will transport via ems RN to call report to [phone] room 6.  Evette Cristal, MSW, [place]

## 2017-11-22 NOTE — Discharge Summary (Signed)
[facility]    Discharge Summary    Patient ID:  Dawn [name] MRN: [id number] DOB/AGE: Mar 27, 1928 83 y.o.  Admit date: 11/09/2017 Discharge date: 11/22/2017 Date of Surgery: 11/14/2017 Surgeon: Surgeon(s): Dew, Erskine Squibb, MD Schnier, Dolores Lory, MD  Admission Diagnosis: LT leg angio   ASO w claudication  Discharge Diagnoses:  LT leg angio   ASO w claudication  Secondary Diagnoses: Past Medical History:  Diagnosis Date  . Cancer ([place])    skin cancer  . 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 Hypertension   . Sciatica   . Sciatica     Procedure(s): ENDARTERECTOMY FEMORAL  Discharged Condition: good  HPI:  Patient admitted for profound LLE ischemia. Had procedures to restore blood flow. Prolonged recovery has followed with poor PO intake and failure to thrive.  Hospital Course:  Dawn [name] is a 83 y.o. female is S/P Left Procedure(s): ENDARTERECTOMY FEMORAL Extubated: POD # 0 Physical exam: feet warm with good pedal pulses Post-op wounds healing well Pt. Ambulating, voiding and taking PO diet without difficulty. Pt pain controlled with PO pain meds. Labs as below Complications:none  Consults:  Treatment Team:  Molli Barrows, MD Dustin Flock, MD  Significant Diagnostic Studies: CBC Lab Results  Component Value Date   WBC 9.0 11/19/2017   HGB 8.6 (L) 11/19/2017   HCT 26.3 (L) 11/19/2017   MCV 85.4 11/19/2017   PLT 452 (H) 11/19/2017    BMET    Component Value Date/Time   NA 130 (L) 11/20/2017 0437   K 4.0 11/20/2017 0437   CL 98 (L) 11/20/2017 0437   CO2 23 11/20/2017 0437   GLUCOSE 93 11/20/2017 0437   BUN 40 (H) 11/20/2017 0437   CREATININE 0.79 11/20/2017 0437   CREATININE 0.92 03/13/2012 0805   CALCIUM 8.4 (L) 11/20/2017 0437   GFRNONAA >60 11/20/2017 0437   GFRNONAA 58 (L) 03/13/2012 0805   GFRAA >60 11/20/2017 0437   GFRAA >60  03/13/2012 0805   COAG Lab Results  Component Value Date   INR 0.94 11/13/2017   INR 0.97 11/10/2017   INR 1.02 04/18/2017     Disposition:  Discharge to :Nursing Home Discharge Instructions    Call MD for:  redness, tenderness, or signs of infection (pain, swelling, bleeding, redness, odor or green/yellow discharge around incision site)   Complete by:  As directed    Call MD for:  severe or increased pain, loss or decreased feeling  in affected limb(s)   Complete by:  As directed    Call MD for:  temperature >100.5   Complete by:  As directed    No dressing needed   Complete by:  As directed    Replace only if drainage present   Resume previous diet   Complete by:  As directed      Allergies as of 11/22/2017      Reactions   Percocet [oxycodone-acetaminophen] Itching   Percodan [oxycodone-aspirin] Itching      Medication List    TAKE these medications   acidophilus Caps capsule Take 1 capsule by mouth daily. Start taking on:  11/23/2017   amLODipine 10 MG tablet Commonly known as:  NORVASC Take 1 tablet (10 mg total) by mouth daily. Start taking on:  11/23/2017   aspirin EC 81 MG tablet Take 81 mg by mouth daily.   atorvastatin 20 MG tablet Commonly known as:  LIPITOR TAKE  1 TABLET BY MOUTH ONCE DAILY AT  6PM   B-12 5000 MCG Caps Take 5,000 mcg by mouth daily.   cloNIDine 0.1 MG tablet Commonly known as:  CATAPRES Take 1 tablet (0.1 mg total) by mouth 2 (two) times daily. What changed:    how much to take  additional instructions   clopidogrel 75 MG tablet Commonly known as:  PLAVIX TAKE 1 TABLET BY MOUTH ONCE DAILY   hydrochlorothiazide 25 MG tablet Commonly known as:  HYDRODIURIL Take 25 mg by mouth daily.   indomethacin 50 MG capsule Commonly known as:  INDOCIN Take 50 mg by mouth 2 (two) times daily as needed (for gout flares).   ketorolac 0.4 % Soln Commonly known as:  ACULAR Place 1 drop into the left eye 3 (three) times daily.    LEG-GESIC PO Take 0.5 tablets by mouth at bedtime. LEGATRIN PM   naproxen sodium 220 MG tablet Commonly known as:  ALEVE Take 440 mg by mouth daily as needed (PAIN).   traMADol 50 MG tablet Commonly known as:  ULTRAM Take 1 tablet (50 mg total) by mouth every 6 (six) hours as needed. What changed:  when to take this   tretinoin 0.05 % cream Commonly known as:  RETIN-A Apply 1 application topically every other day. EVERY OTHER NIGHT   Vitamin D3 400 units Caps Take 400 Units by mouth daily.      Verbal and written Discharge instructions given to the patient. Wound care per Discharge AVS Follow-up Information    Dew, Erskine Squibb, MD Follow up in 3 week(s).   Specialties:  Vascular Surgery, Radiology, Interventional Cardiology Why:  with ABIs Contact information: [place] [address] [phone]           Signed: Leotis Pain, MD  11/22/2017, 11:45 AM

## 2017-11-23 ENCOUNTER — Encounter: Payer: Self-pay | Admitting: Vascular Surgery

## 2017-12-21 ENCOUNTER — Ambulatory Visit (INDEPENDENT_AMBULATORY_CARE_PROVIDER_SITE_OTHER): Payer: Medicare Other | Admitting: Vascular Surgery

## 2017-12-21 ENCOUNTER — Encounter (INDEPENDENT_AMBULATORY_CARE_PROVIDER_SITE_OTHER): Payer: Medicare Other

## 2017-12-31 ENCOUNTER — Encounter (INDEPENDENT_AMBULATORY_CARE_PROVIDER_SITE_OTHER): Payer: Medicare Other

## 2017-12-31 ENCOUNTER — Ambulatory Visit (INDEPENDENT_AMBULATORY_CARE_PROVIDER_SITE_OTHER): Payer: Medicare Other | Admitting: Vascular Surgery

## 2018-01-03 ENCOUNTER — Other Ambulatory Visit: Payer: Self-pay

## 2018-01-03 ENCOUNTER — Inpatient Hospital Stay: Payer: Medicare Other

## 2018-01-03 ENCOUNTER — Emergency Department: Payer: Medicare Other

## 2018-01-03 ENCOUNTER — Encounter: Payer: Self-pay | Admitting: Intensive Care

## 2018-01-03 ENCOUNTER — Inpatient Hospital Stay
Admission: EM | Admit: 2018-01-03 | Discharge: 2018-01-14 | DRG: 871 | Disposition: A | Discharge status: Skilled Nursing Facility | Payer: Medicare Other | Attending: Internal Medicine | Admitting: Internal Medicine

## 2018-01-03 DIAGNOSIS — I739 Peripheral vascular disease, unspecified: Secondary | ICD-10-CM | POA: Diagnosis present

## 2018-01-03 DIAGNOSIS — Z9842 Cataract extraction status, left eye: Secondary | ICD-10-CM | POA: Diagnosis not present

## 2018-01-03 DIAGNOSIS — Z85828 Personal history of other malignant neoplasm of skin: Secondary | ICD-10-CM

## 2018-01-03 DIAGNOSIS — E876 Hypokalemia: Secondary | ICD-10-CM | POA: Diagnosis present

## 2018-01-03 DIAGNOSIS — X58XXXA Exposure to other specified factors, initial encounter: Secondary | ICD-10-CM | POA: Diagnosis present

## 2018-01-03 DIAGNOSIS — M1A9XX0 Chronic gout, unspecified, without tophus (tophi): Secondary | ICD-10-CM | POA: Diagnosis present

## 2018-01-03 DIAGNOSIS — E872 Acidosis: Secondary | ICD-10-CM | POA: Diagnosis present

## 2018-01-03 DIAGNOSIS — L8990 Pressure ulcer of unspecified site, unspecified stage: Secondary | ICD-10-CM

## 2018-01-03 DIAGNOSIS — J440 Chronic obstructive pulmonary disease with acute lower respiratory infection: Secondary | ICD-10-CM | POA: Diagnosis not present

## 2018-01-03 DIAGNOSIS — D509 Iron deficiency anemia, unspecified: Secondary | ICD-10-CM | POA: Diagnosis present

## 2018-01-03 DIAGNOSIS — J189 Pneumonia, unspecified organism: Secondary | ICD-10-CM | POA: Diagnosis not present

## 2018-01-03 DIAGNOSIS — Z681 Body mass index (BMI) 19 or less, adult: Secondary | ICD-10-CM

## 2018-01-03 DIAGNOSIS — L89152 Pressure ulcer of sacral region, stage 2: Secondary | ICD-10-CM | POA: Diagnosis present

## 2018-01-03 DIAGNOSIS — R509 Fever, unspecified: Secondary | ICD-10-CM

## 2018-01-03 DIAGNOSIS — Z7982 Long term (current) use of aspirin: Secondary | ICD-10-CM

## 2018-01-03 DIAGNOSIS — D62 Acute posthemorrhagic anemia: Secondary | ICD-10-CM | POA: Diagnosis present

## 2018-01-03 DIAGNOSIS — I10 Essential (primary) hypertension: Secondary | ICD-10-CM | POA: Diagnosis present

## 2018-01-03 DIAGNOSIS — A0811 Acute gastroenteropathy due to Norwalk agent: Secondary | ICD-10-CM | POA: Diagnosis present

## 2018-01-03 DIAGNOSIS — Z9049 Acquired absence of other specified parts of digestive tract: Secondary | ICD-10-CM | POA: Diagnosis not present

## 2018-01-03 DIAGNOSIS — Z79891 Long term (current) use of opiate analgesic: Secondary | ICD-10-CM

## 2018-01-03 DIAGNOSIS — B370 Candidal stomatitis: Secondary | ICD-10-CM | POA: Diagnosis present

## 2018-01-03 DIAGNOSIS — R1310 Dysphagia, unspecified: Secondary | ICD-10-CM | POA: Diagnosis present

## 2018-01-03 DIAGNOSIS — E860 Dehydration: Secondary | ICD-10-CM | POA: Diagnosis present

## 2018-01-03 DIAGNOSIS — E538 Deficiency of other specified B group vitamins: Secondary | ICD-10-CM | POA: Diagnosis present

## 2018-01-03 DIAGNOSIS — Z8249 Family history of ischemic heart disease and other diseases of the circulatory system: Secondary | ICD-10-CM

## 2018-01-03 DIAGNOSIS — S8011XA Contusion of right lower leg, initial encounter: Secondary | ICD-10-CM | POA: Diagnosis present

## 2018-01-03 DIAGNOSIS — Z833 Family history of diabetes mellitus: Secondary | ICD-10-CM

## 2018-01-03 DIAGNOSIS — E43 Unspecified severe protein-calorie malnutrition: Secondary | ICD-10-CM | POA: Diagnosis present

## 2018-01-03 DIAGNOSIS — Z9841 Cataract extraction status, right eye: Secondary | ICD-10-CM

## 2018-01-03 DIAGNOSIS — E785 Hyperlipidemia, unspecified: Secondary | ICD-10-CM | POA: Diagnosis present

## 2018-01-03 DIAGNOSIS — N390 Urinary tract infection, site not specified: Secondary | ICD-10-CM | POA: Diagnosis present

## 2018-01-03 DIAGNOSIS — Z95820 Peripheral vascular angioplasty status with implants and grafts: Secondary | ICD-10-CM

## 2018-01-03 DIAGNOSIS — E559 Vitamin D deficiency, unspecified: Secondary | ICD-10-CM | POA: Diagnosis present

## 2018-01-03 DIAGNOSIS — R197 Diarrhea, unspecified: Secondary | ICD-10-CM

## 2018-01-03 DIAGNOSIS — N179 Acute kidney failure, unspecified: Secondary | ICD-10-CM | POA: Diagnosis present

## 2018-01-03 DIAGNOSIS — Z79899 Other long term (current) drug therapy: Secondary | ICD-10-CM

## 2018-01-03 DIAGNOSIS — Z885 Allergy status to narcotic agent status: Secondary | ICD-10-CM | POA: Diagnosis not present

## 2018-01-03 DIAGNOSIS — Z90710 Acquired absence of both cervix and uterus: Secondary | ICD-10-CM

## 2018-01-03 DIAGNOSIS — A419 Sepsis, unspecified organism: Secondary | ICD-10-CM | POA: Diagnosis present

## 2018-01-03 DIAGNOSIS — Z7902 Long term (current) use of antithrombotics/antiplatelets: Secondary | ICD-10-CM

## 2018-01-03 DIAGNOSIS — S8012XA Contusion of left lower leg, initial encounter: Secondary | ICD-10-CM | POA: Diagnosis present

## 2018-01-03 DIAGNOSIS — K921 Melena: Secondary | ICD-10-CM | POA: Diagnosis present

## 2018-01-03 DIAGNOSIS — R7989 Other specified abnormal findings of blood chemistry: Secondary | ICD-10-CM

## 2018-01-03 DIAGNOSIS — Z96643 Presence of artificial hip joint, bilateral: Secondary | ICD-10-CM | POA: Diagnosis present

## 2018-01-03 DIAGNOSIS — Z87891 Personal history of nicotine dependence: Secondary | ICD-10-CM

## 2018-01-03 LAB — CBC WITH DIFFERENTIAL/PLATELET
BASOS ABS: 0 10*3/uL (ref 0–0.1)
BASOS ABS: 0 10*3/uL (ref 0–0.1)
BASOS PCT: 0 %
Basophils Relative: 0 %
EOS ABS: 0 10*3/uL (ref 0–0.7)
EOS PCT: 0 %
EOS PCT: 0 %
Eosinophils Absolute: 0 10*3/uL (ref 0–0.7)
HCT: 29.3 % — ABNORMAL LOW (ref 35.0–47.0)
HEMATOCRIT: 23.2 % — AB (ref 35.0–47.0)
Hemoglobin: 9.2 g/dL — ABNORMAL LOW (ref 12.0–16.0)
Hemoglobin: 7.1 g/dL — ABNORMAL LOW (ref 12.0–16.0)
LYMPHS ABS: 0.5 10*3/uL — AB (ref 1.0–3.6)
LYMPHS PCT: 3 %
LYMPHS PCT: 4 %
Lymphs Abs: 0.7 10*3/uL — ABNORMAL LOW (ref 1.0–3.6)
MCH: 25.3 pg — AB (ref 26.0–34.0)
MCH: 25.1 pg — ABNORMAL LOW (ref 26.0–34.0)
MCHC: 31.5 g/dL — ABNORMAL LOW (ref 32.0–36.0)
MCHC: 30.8 g/dL — ABNORMAL LOW (ref 32.0–36.0)
MCV: 80.3 fL (ref 80.0–100.0)
MCV: 81.5 fL (ref 80.0–100.0)
MONO ABS: 0.6 10*3/uL (ref 0.2–0.9)
MONO ABS: 0.9 10*3/uL (ref 0.2–0.9)
Monocytes Relative: 4 %
Monocytes Relative: 5 %
NEUTROS ABS: 16.2 10*3/uL — AB (ref 1.4–6.5)
Neutro Abs: 14.4 10*3/uL — ABNORMAL HIGH (ref 1.4–6.5)
Neutrophils Relative %: 93 %
Neutrophils Relative %: 91 %
PLATELETS: 586 10*3/uL — AB (ref 150–440)
PLATELETS: 439 10*3/uL (ref 150–440)
RBC: 3.65 MIL/uL — ABNORMAL LOW (ref 3.80–5.20)
RBC: 2.84 MIL/uL — AB (ref 3.80–5.20)
RDW: 17.1 % — AB (ref 11.5–14.5)
RDW: 17.3 % — AB (ref 11.5–14.5)
WBC: 15.6 10*3/uL — ABNORMAL HIGH (ref 3.6–11.0)
WBC: 17.8 10*3/uL — AB (ref 3.6–11.0)

## 2018-01-03 LAB — GASTROINTESTINAL PANEL BY PCR, STOOL (REPLACES STOOL CULTURE)
ASTROVIRUS: NOT DETECTED
Adenovirus F40/41: NOT DETECTED
Campylobacter species: NOT DETECTED
Cryptosporidium: NOT DETECTED
Cyclospora cayetanensis: NOT DETECTED
ENTAMOEBA HISTOLYTICA: NOT DETECTED
ENTEROTOXIGENIC E COLI (ETEC): NOT DETECTED
Enteroaggregative E coli (EAEC): NOT DETECTED
Enteropathogenic E coli (EPEC): NOT DETECTED
Giardia lamblia: NOT DETECTED
NOROVIRUS GI/GII: DETECTED — AB
Plesimonas shigelloides: NOT DETECTED
ROTAVIRUS A: NOT DETECTED
SALMONELLA SPECIES: NOT DETECTED
SAPOVIRUS (I, II, IV, AND V): NOT DETECTED
SHIGA LIKE TOXIN PRODUCING E COLI (STEC): NOT DETECTED
SHIGELLA/ENTEROINVASIVE E COLI (EIEC): NOT DETECTED
VIBRIO CHOLERAE: NOT DETECTED
Vibrio species: NOT DETECTED
Yersinia enterocolitica: NOT DETECTED

## 2018-01-03 LAB — URINALYSIS, COMPLETE (UACMP) WITH MICROSCOPIC
BACTERIA UA: NONE SEEN
Bilirubin Urine: NEGATIVE
GLUCOSE, UA: NEGATIVE mg/dL
Ketones, ur: NEGATIVE mg/dL
Nitrite: POSITIVE — AB
PROTEIN: 30 mg/dL — AB
Specific Gravity, Urine: 1.015 (ref 1.005–1.030)
pH: 8 (ref 5.0–8.0)

## 2018-01-03 LAB — LACTIC ACID, PLASMA
LACTIC ACID, VENOUS: 3.6 mmol/L — AB (ref 0.5–1.9)
LACTIC ACID, VENOUS: 2.4 mmol/L — AB (ref 0.5–1.9)
Lactic Acid, Venous: 2.3 mmol/L (ref 0.5–1.9)
Lactic Acid, Venous: 1.9 mmol/L (ref 0.5–1.9)
Lactic Acid, Venous: 2 mmol/L (ref 0.5–1.9)

## 2018-01-03 LAB — COMPREHENSIVE METABOLIC PANEL
ALBUMIN: 2.2 g/dL — AB (ref 3.5–5.0)
ALT: 21 U/L (ref 14–54)
ALT: 23 U/L (ref 14–54)
AST: 55 U/L — ABNORMAL HIGH (ref 15–41)
AST: 54 U/L — AB (ref 15–41)
Albumin: 2.7 g/dL — ABNORMAL LOW (ref 3.5–5.0)
Alkaline Phosphatase: 111 U/L (ref 38–126)
Alkaline Phosphatase: 110 U/L (ref 38–126)
Anion gap: 16 — ABNORMAL HIGH (ref 5–15)
Anion gap: 13 (ref 5–15)
BUN: 60 mg/dL — ABNORMAL HIGH (ref 6–20)
BUN: 46 mg/dL — AB (ref 6–20)
CHLORIDE: 102 mmol/L (ref 101–111)
CHLORIDE: 110 mmol/L (ref 101–111)
CO2: 26 mmol/L (ref 22–32)
CO2: 21 mmol/L — ABNORMAL LOW (ref 22–32)
CREATININE: 0.96 mg/dL (ref 0.44–1.00)
Calcium: 9.3 mg/dL (ref 8.9–10.3)
Calcium: 7.6 mg/dL — ABNORMAL LOW (ref 8.9–10.3)
Creatinine, Ser: 1.22 mg/dL — ABNORMAL HIGH (ref 0.44–1.00)
GFR calc Af Amer: 60 mL/min — ABNORMAL LOW (ref >60)
GFR calc non Af Amer: 52 mL/min — ABNORMAL LOW (ref >60)
GFR, EST AFRICAN AMERICAN: 45 mL/min — AB (ref >60)
GFR, EST NON AFRICAN AMERICAN: 39 mL/min — AB (ref >60)
GLUCOSE: 138 mg/dL — AB (ref 65–99)
Glucose, Bld: 144 mg/dL — ABNORMAL HIGH (ref 65–99)
POTASSIUM: 3.4 mmol/L — AB (ref 3.5–5.1)
POTASSIUM: 2.9 mmol/L — AB (ref 3.5–5.1)
SODIUM: 144 mmol/L (ref 135–145)
Sodium: 144 mmol/L (ref 135–145)
Total Bilirubin: 0.4 mg/dL (ref 0.3–1.2)
Total Bilirubin: 0.2 mg/dL — ABNORMAL LOW (ref 0.3–1.2)
Total Protein: 7.7 g/dL (ref 6.5–8.1)
Total Protein: 6.3 g/dL — ABNORMAL LOW (ref 6.5–8.1)

## 2018-01-03 LAB — PROCALCITONIN: Procalcitonin: 0.12 ng/mL

## 2018-01-03 LAB — MRSA PCR SCREENING: MRSA BY PCR: NEGATIVE

## 2018-01-03 LAB — APTT: APTT: 30 s (ref 24–36)

## 2018-01-03 LAB — PREALBUMIN: PREALBUMIN: 15.7 mg/dL — AB (ref 18–38)

## 2018-01-03 LAB — PROTIME-INR
INR: 1.11
PROTHROMBIN TIME: 14.2 s (ref 11.4–15.2)

## 2018-01-03 LAB — TROPONIN I

## 2018-01-03 MED ORDER — AMLODIPINE BESYLATE 10 MG PO TABS
10.0000 mg | ORAL_TABLET | Freq: Every day | ORAL | Status: DC
  Administered 2018-01-04 – 2018-01-14 (×11): 10 mg via ORAL
  Filled 2018-01-03 (×10): qty 1

## 2018-01-03 MED ORDER — SODIUM CHLORIDE 0.9 % IV BOLUS (SEPSIS)
500.0000 mL | Freq: Once | INTRAVENOUS | Status: AC
  Administered 2018-01-03: 500 mL via INTRAVENOUS

## 2018-01-03 MED ORDER — TRAMADOL HCL 50 MG PO TABS
50.0000 mg | ORAL_TABLET | Freq: Four times a day (QID) | ORAL | Status: DC | PRN
  Administered 2018-01-04 – 2018-01-14 (×6): 50 mg via ORAL
  Filled 2018-01-03 (×6): qty 1

## 2018-01-03 MED ORDER — VITAMIN B-12 1000 MCG PO TABS
5000.0000 ug | ORAL_TABLET | Freq: Every day | ORAL | Status: DC
  Administered 2018-01-04 – 2018-01-08 (×5): 5000 ug via ORAL
  Filled 2018-01-03 (×6): qty 5

## 2018-01-03 MED ORDER — IPRATROPIUM-ALBUTEROL 0.5-2.5 (3) MG/3ML IN SOLN
3.0000 mL | Freq: Four times a day (QID) | RESPIRATORY_TRACT | Status: DC | PRN
  Filled 2018-01-03: qty 3

## 2018-01-03 MED ORDER — CHLORHEXIDINE GLUCONATE 0.12 % MT SOLN
15.0000 mL | Freq: Two times a day (BID) | OROMUCOSAL | Status: DC
  Administered 2018-01-03 – 2018-01-14 (×22): 15 mL via OROMUCOSAL
  Filled 2018-01-03 (×22): qty 15

## 2018-01-03 MED ORDER — SODIUM CHLORIDE 0.9 % IV BOLUS (SEPSIS): 1000.0000 mL | Freq: Once | INTRAVENOUS | Status: DC

## 2018-01-03 MED ORDER — CHOLECALCIFEROL 10 MCG (400 UNIT) PO TABS
400.0000 [IU] | ORAL_TABLET | Freq: Every day | ORAL | Status: DC
  Administered 2018-01-04 – 2018-01-14 (×10): 400 [IU] via ORAL
  Filled 2018-01-03 (×11): qty 1

## 2018-01-03 MED ORDER — DEXTROSE 5 % IV SOLN: 2.0000 g | Freq: Once | INTRAVENOUS | Status: DC

## 2018-01-03 MED ORDER — SODIUM CHLORIDE 0.9% FLUSH
3.0000 mL | Freq: Two times a day (BID) | INTRAVENOUS | Status: DC
  Administered 2018-01-03 – 2018-01-13 (×17): 3 mL via INTRAVENOUS

## 2018-01-03 MED ORDER — DEXTROSE 5 % IV SOLN
1.0000 g | Freq: Once | INTRAVENOUS | Status: AC
  Administered 2018-01-03: 1 g via INTRAVENOUS
  Filled 2018-01-03: qty 1

## 2018-01-03 MED ORDER — TRETINOIN 0.05 % EX CREA
1.0000 | TOPICAL_CREAM | CUTANEOUS | Status: DC
Start: 2018-01-03 — End: 2018-01-04

## 2018-01-03 MED ORDER — ACETAMINOPHEN 650 MG RE SUPP: 650.0000 mg | Freq: Four times a day (QID) | RECTAL | Status: DC | PRN

## 2018-01-03 MED ORDER — CLOPIDOGREL BISULFATE 75 MG PO TABS
75.0000 mg | ORAL_TABLET | Freq: Every day | ORAL | Status: DC
  Administered 2018-01-04: 75 mg via ORAL
  Filled 2018-01-03: qty 1

## 2018-01-03 MED ORDER — ONDANSETRON HCL 4 MG PO TABS: 4.0000 mg | ORAL_TABLET | Freq: Four times a day (QID) | ORAL | Status: DC | PRN

## 2018-01-03 MED ORDER — ORAL CARE MOUTH RINSE
15.0000 mL | Freq: Two times a day (BID) | OROMUCOSAL | Status: DC
  Administered 2018-01-04 – 2018-01-13 (×15): 15 mL via OROMUCOSAL

## 2018-01-03 MED ORDER — ACETAMINOPHEN 325 MG PO TABS
650.0000 mg | ORAL_TABLET | Freq: Four times a day (QID) | ORAL | Status: DC | PRN
  Administered 2018-01-07 – 2018-01-13 (×2): 650 mg via ORAL
  Filled 2018-01-03 (×3): qty 2

## 2018-01-03 MED ORDER — GUAIFENESIN 100 MG/5ML PO SYRP
400.0000 mg | ORAL_SOLUTION | Freq: Three times a day (TID) | ORAL | Status: DC | PRN
  Administered 2018-01-06 – 2018-01-09 (×4): 400 mg via ORAL
  Filled 2018-01-03 (×7): qty 20

## 2018-01-03 MED ORDER — HEPARIN SODIUM (PORCINE) 5000 UNIT/ML IJ SOLN
5000.0000 [IU] | Freq: Three times a day (TID) | INTRAMUSCULAR | Status: DC
  Administered 2018-01-03 – 2018-01-04 (×2): 5000 [IU] via SUBCUTANEOUS
  Filled 2018-01-03 (×2): qty 1

## 2018-01-03 MED ORDER — ONDANSETRON HCL 4 MG/2ML IJ SOLN
INTRAMUSCULAR | Status: AC
  Administered 2018-01-03: 4 mg via INTRAVENOUS
  Filled 2018-01-03: qty 2

## 2018-01-03 MED ORDER — INDOMETHACIN 25 MG PO CAPS
50.0000 mg | ORAL_CAPSULE | Freq: Two times a day (BID) | ORAL | Status: DC | PRN
  Filled 2018-01-03: qty 1

## 2018-01-03 MED ORDER — ONDANSETRON HCL 4 MG/2ML IJ SOLN
4.0000 mg | Freq: Once | INTRAMUSCULAR | Status: AC
  Administered 2018-01-03: 4 mg via INTRAVENOUS

## 2018-01-03 MED ORDER — ADULT MULTIVITAMIN W/MINERALS CH
1.0000 | ORAL_TABLET | Freq: Every day | ORAL | Status: DC
  Administered 2018-01-04 – 2018-01-13 (×10): 1 via ORAL
  Filled 2018-01-03 (×10): qty 1

## 2018-01-03 MED ORDER — SODIUM CHLORIDE 0.9% FLUSH
3.0000 mL | Freq: Two times a day (BID) | INTRAVENOUS | Status: DC
  Administered 2018-01-04 – 2018-01-13 (×18): 3 mL via INTRAVENOUS

## 2018-01-03 MED ORDER — DEXTROSE 5 % IV SOLN
2.0000 g | INTRAVENOUS | Status: DC
  Filled 2018-01-03: qty 2

## 2018-01-03 MED ORDER — NYSTATIN 100000 UNIT/ML MT SUSP
5.0000 mL | Freq: Four times a day (QID) | OROMUCOSAL | Status: DC
  Administered 2018-01-03 (×2): 500000 [IU] via ORAL
  Filled 2018-01-03 (×2): qty 5

## 2018-01-03 MED ORDER — KETOROLAC TROMETHAMINE 0.5 % OP SOLN
1.0000 [drp] | Freq: Three times a day (TID) | OPHTHALMIC | Status: DC
  Administered 2018-01-03 – 2018-01-14 (×33): 1 [drp] via OPHTHALMIC
  Filled 2018-01-03: qty 3

## 2018-01-03 MED ORDER — ONDANSETRON HCL 4 MG/2ML IJ SOLN: 4.0000 mg | Freq: Four times a day (QID) | INTRAMUSCULAR | Status: DC | PRN

## 2018-01-03 MED ORDER — SODIUM CHLORIDE 0.9 % IV BOLUS (SEPSIS): 500.0000 mL | Freq: Once | INTRAVENOUS | Status: DC

## 2018-01-03 MED ORDER — SODIUM CHLORIDE 0.9 % IV BOLUS (SEPSIS)
1000.0000 mL | Freq: Once | INTRAVENOUS | Status: AC
  Administered 2018-01-03: 1000 mL via INTRAVENOUS

## 2018-01-03 MED ORDER — ATORVASTATIN CALCIUM 20 MG PO TABS
20.0000 mg | ORAL_TABLET | Freq: Every day | ORAL | Status: DC
  Administered 2018-01-04 – 2018-01-13 (×10): 20 mg via ORAL
  Filled 2018-01-03 (×11): qty 1

## 2018-01-03 MED ORDER — DEXTROSE 5 % IV SOLN
1.0000 g | Freq: Once | INTRAVENOUS | Status: AC
  Administered 2018-01-03: 1 g via INTRAVENOUS
  Filled 2018-01-03: qty 10

## 2018-01-03 MED ORDER — BUDESONIDE 0.5 MG/2ML IN SUSP
0.5000 mg | Freq: Two times a day (BID) | RESPIRATORY_TRACT | Status: DC
  Administered 2018-01-04 – 2018-01-14 (×19): 0.5 mg via RESPIRATORY_TRACT
  Filled 2018-01-03 (×23): qty 2

## 2018-01-03 MED ORDER — RISAQUAD PO CAPS
1.0000 | ORAL_CAPSULE | Freq: Every day | ORAL | Status: DC
  Administered 2018-01-04 – 2018-01-14 (×11): 1 via ORAL
  Filled 2018-01-03 (×11): qty 1

## 2018-01-03 MED ORDER — ASPIRIN EC 81 MG PO TBEC
81.0000 mg | DELAYED_RELEASE_TABLET | Freq: Every day | ORAL | Status: DC
  Administered 2018-01-04: 81 mg via ORAL
  Filled 2018-01-03: qty 1

## 2018-01-03 MED ORDER — CLONIDINE HCL 0.1 MG PO TABS
0.1000 mg | ORAL_TABLET | Freq: Three times a day (TID) | ORAL | Status: DC
  Administered 2018-01-03 – 2018-01-14 (×33): 0.1 mg via ORAL
  Filled 2018-01-03 (×32): qty 1

## 2018-01-03 MED ORDER — KCL IN DEXTROSE-NACL 20-5-0.45 MEQ/L-%-% IV SOLN
INTRAVENOUS | Status: DC
  Administered 2018-01-03: 18:00:00 via INTRAVENOUS
  Filled 2018-01-03 (×2): qty 1000

## 2018-01-03 NOTE — ED Notes (Signed)
Chaplin at bedside. Pt's son notably upset and has left treatment room with chaplin. Family remains at bedside.

## 2018-01-03 NOTE — Progress Notes (Signed)
Pharmacy Antibiotic Note  Evette Diclemente is a 84 y.o. female admitted on 01/03/2018 with UTI.  Pharmacy has been consulted for cefepime dosing.  Plan: cefepime 2gm iv q24h   Height: 5\' 3"  (160 cm) Weight: 91 lb 9.6 oz (41.5 kg) IBW/kg (Calculated) : 52.4  Temp (24hrs), Avg:97.7 F (36.5 C), Min:97.5 F (36.4 C), Max:97.8 F (36.6 C)  Recent Labs  Lab 01/03/18 1141 01/03/18 1434  WBC 15.6*  --   CREATININE 1.22*  --   LATICACIDVEN 3.6* 2.4*    Estimated Creatinine Clearance: 21.3 mL/min (A) (by C-G formula based on SCr of 1.22 mg/dL (H)).    Allergies  Allergen Reactions  . Percocet [Oxycodone-Acetaminophen] Itching  . Percodan [Oxycodone-Aspirin] Itching    Antimicrobials this admission: Anti-infectives (From admission, onward)   Start     Dose/Rate Route Frequency Ordered Stop   01/04/18 1400  ceFEPIme (MAXIPIME) 2 g in dextrose 5 % 50 mL IVPB     2 g 100 mL/hr over 30 Minutes Intravenous Every 24 hours 01/03/18 1527     01/03/18 1530  ceFEPIme (MAXIPIME) 2 g in dextrose 5 % 50 mL IVPB  Status:  Discontinued     2 g 100 mL/hr over 30 Minutes Intravenous  Once 01/03/18 1521 01/03/18 1526   01/03/18 1530  ceFEPIme (MAXIPIME) 1 g in dextrose 5 % 50 mL IVPB     1 g 100 mL/hr over 30 Minutes Intravenous  Once 01/03/18 1526     01/03/18 1400  ceFEPIme (MAXIPIME) 1 g in dextrose 5 % 50 mL IVPB     1 g 100 mL/hr over 30 Minutes Intravenous  Once 01/03/18 1346 01/03/18 1430   01/03/18 1230  cefTRIAXone (ROCEPHIN) 1 g in dextrose 5 % 50 mL IVPB     1 g 100 mL/hr over 30 Minutes Intravenous  Once 01/03/18 1221 01/03/18 1332      Microbiology results: No results found for this or any previous visit (from the past 240 hour(s)).   Thank you for allowing pharmacy to be a part of this patient's care.  Donna Christen Neils [place] 01/03/2018 3:27 PM

## 2018-01-03 NOTE — ED Provider Notes (Signed)
[facility] Emergency Department Provider Note  _______________________________________   I have reviewed the triage vital signs and the nursing notes.   HISTORY  Chief Complaint Emesis and Diarrhea   History limited by confusion, history obtained from son.   HPI Dawn [name] is a 84 y.o. female who presents to the emergency department today because of concern for diarrhea, weakness and ams. Patient is coming from rehab facility after vascular surgery. The son states that since going there she has not been doing well. She will have 1 good day and then 5 bad days. Recently he has noticed that she has been weaker. She has also started having some intermittent confusion. She has had cough recently and son states x-ray was done Monday which did not show any concerning findings. Denies any fevers.    Per medical record review patient has a history of recent vascular surgery.   Past Medical History:  Diagnosis Date  . Cancer ([place])    skin cancer  . 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 Hypertension   . Sciatica   . Sciatica     Patient Active Problem List   Diagnosis Date Noted  . Protein-calorie malnutrition, severe 11/14/2017  . Atherosclerosis of native arteries of extremity with rest pain ([place]) 11/09/2017  . Ischemic leg 11/09/2017  . Atherosclerotic peripheral vascular disease with rest pain ([place]) 11/09/2017  . Atypical chest pain 09/21/2017  . Renal artery stenosis ([place]) 06/15/2017  . Hyperlipidemia 05/09/2017  . Essential hypertension 10/17/2016  . Carotid stenosis 10/17/2016  . PVD (peripheral vascular disease) ([place]) 10/17/2016  . B12 deficiency 07/19/2016  . Vitamin D deficiency, unspecified 07/19/2016  . History of right hip replacement 11/04/2015  . Aftercare following bilateral hip joint replacement surgery 11/05/2014  . SCC (squamous cell carcinoma) 05/21/2014  . COPD with  asthma ([place]) 03/16/2014  . S/P hip replacement 10/03/2012    Past Surgical History:  Procedure Laterality Date  . ABDOMINAL HYSTERECTOMY    . APPENDECTOMY    . CATARACT EXTRACTION, BILATERAL    . CHOLECYSTECTOMY    . ENDARTERECTOMY FEMORAL Right 04/19/2017   Procedure: ENDARTERECTOMY COMMON FEMORAL ARTERY, PROFUNDA  FEMORIS ARTERY, and  SUPERFICIAL FEMORAL ARTERY;  Surgeon: Algernon Huxley, MD;  Location: [facility] OR[facility];  Service: Vascular;  Laterality: Right;  . ENDARTERECTOMY FEMORAL Left 11/14/2017   Procedure: ENDARTERECTOMY FEMORAL;  Surgeon: Algernon Huxley, MD;  Location: [facility];  Service: Vascular;  Laterality: Left;  . HIP SURGERY Bilateral   . LOWER EXTREMITY ANGIOGRAM Right 04/19/2017   Procedure: LOWER EXTREMITY ANGIOGRAM WITH SUPERFICIAL FEMORAL ARTERY STENT PLACEMENT;  Surgeon: Algernon Huxley, MD;  Location: [facility];  Service: Vascular;  Laterality: Right;  . LOWER EXTREMITY ANGIOGRAPHY Right 04/18/2017   Procedure: Lower Extremity Angiography;  Surgeon: Algernon Huxley, MD;  Location: [place] [facility];  Service: Cardiovascular;  Laterality: Right;  . LOWER EXTREMITY ANGIOGRAPHY Left 07/12/2017   Procedure: Lower Extremity Angiography;  Surgeon: Algernon Huxley, MD;  Location: [place] [facility];  Service: Cardiovascular;  Laterality: Left;  . LOWER EXTREMITY ANGIOGRAPHY Left 11/09/2017   Procedure: LOWER EXTREMITY ANGIOGRAPHY;  Surgeon: Algernon Huxley, MD;  Location: [place] [facility];  Service: Cardiovascular;  Laterality: Left;  . LOWER EXTREMITY INTERVENTION  07/12/2017   Procedure: Lower Extremity Intervention;  Surgeon: Algernon Huxley, MD;  Location: [place] [facility];  Service: Cardiovascular;;  . LOWER EXTREMITY INTERVENTION  11/09/2017   Procedure: LOWER EXTREMITY INTERVENTION;  Surgeon: Algernon Huxley, MD;  Location: [place] [facility];  Service: Cardiovascular;;  . RENAL ANGIOGRAPHY N/A 06/21/2017   Procedure: Renal Angiography;  Surgeon: Algernon Huxley, MD;  Location: [place] [facility];  Service: Cardiovascular;  Laterality: N/A;  . SHOULDER SURGERY Bilateral   . TOTAL HIP ARTHROPLASTY Bilateral     Prior to Admission medications   Medication Sig Start Date End Date Taking? Authorizing Provider  acidophilus (RISAQUAD) CAPS capsule Take 1 capsule by mouth daily. 11/23/17   Algernon Huxley, MD  amLODipine (NORVASC) 10 MG tablet Take 1 tablet (10 mg total) by mouth daily. 11/23/17   Algernon Huxley, MD  aspirin EC 81 MG tablet Take 81 mg by mouth daily.    [provider]  atorvastatin (LIPITOR) 20 MG tablet TAKE 1 TABLET BY MOUTH ONCE DAILY AT  6PM 08/29/17   Algernon Huxley, MD  Cholecalciferol (VITAMIN D3) 400 units CAPS Take 400 Units by mouth daily.    [provider]  cloNIDine (CATAPRES) 0.1 MG tablet Take 1 tablet (0.1 mg total) by mouth 2 (two) times daily. Patient taking differently: Take 0.1-0.2 mg by mouth 2 (two) times daily. 0.1 mg in the morning & 0.2 mg in the evening 06/08/16   Daymon Larsen, MD  clopidogrel (PLAVIX) 75 MG tablet TAKE 1 TABLET BY MOUTH ONCE DAILY 08/29/17   Algernon Huxley, MD  Cyanocobalamin (B-12) 5000 MCG CAPS Take 5,000 mcg by mouth daily.     [provider]  hydrochlorothiazide (HYDRODIURIL) 25 MG tablet Take 25 mg by mouth daily.  03/22/17   [provider]  indomethacin (INDOCIN) 50 MG capsule Take 50 mg by mouth 2 (two) times daily as needed (for gout flares).    [provider]  ketorolac (ACULAR) 0.4 % SOLN Place 1 drop into the left eye 3 (three) times daily.     [provider]  Multiple Minerals-Vitamins (LEG-GESIC PO) Take 0.5 tablets by mouth at bedtime. [place] PM     [provider]  naproxen sodium (ANAPROX) 220 MG tablet Take 440 mg by mouth daily as needed (PAIN).     [provider]  traMADol (ULTRAM) 50 MG tablet Take 1 tablet (50 mg total) by mouth every 6 (six) hours as needed. Patient taking differently: Take 50 mg by mouth daily.  04/20/17    Algernon Huxley, MD  tretinoin (RETIN-A) 0.05 % cream Apply 1 application topically every other day. EVERY OTHER NIGHT    [provider]    Allergies Percocet [oxycodone-acetaminophen] and Percodan [oxycodone-aspirin]  Family History  Problem Relation Age of Onset  . Diabetes Father   . Heart disease Father     Social History Social History   Tobacco Use  . Smoking status: Former Smoker    Types: Cigarettes    Last attempt to quit: 1980    Years since quitting: 39.1  . Smokeless tobacco: Never Used  Substance Use Topics  . Alcohol use: No  . Drug use: No    Review of Systems Constitutional: No fever/chills Eyes: No visual changes. ENT: No sore throat. Cardiovascular: Denies chest pain. Respiratory: Positive for cough. Gastrointestinal: No abdominal pain.  No nausea, no vomiting.  No diarrhea.   Genitourinary: Negative for dysuria. Musculoskeletal: Negative for back pain. Skin: Negative for rash. Neurological: Positive for confusion  ____________________________________________   PHYSICAL EXAM:  VITAL SIGNS: ED Triage Vitals [01/03/18 1027]  Enc Vitals Group     BP (!) 166/62     Pulse Rate (!) 106     Resp (!) 24     Temp (!) 97.5 F (36.4 C)     Temp Source Oral     SpO2 96 %     Weight 91 lb 9.6 oz (41.5 kg)     Height 5\' 3"  (1.6 m)   Constitutional: Alert and oriented. Well appearing and in no distress. Eyes: Conjunctivae are normal.  ENT   Head: Normocephalic and atraumatic.   Nose: No congestion/rhinnorhea.   Mouth/Throat: Mucous membranes are moist.   Neck: No stridor. Hematological/Lymphatic/Immunilogical: No cervical lymphadenopathy. Cardiovascular: Normal rate, regular rhythm.  No murmurs, rubs, or gallops.  Respiratory: Normal respiratory effort without tachypnea nor retractions. Breath sounds are clear and equal bilaterally. No wheezes/rales/rhonchi. Gastrointestinal: Soft and non tender. No rebound. No guarding.   Genitourinary: Deferred Musculoskeletal: Normal range of motion in all extremities. No lower extremity edema. Neurologic:  Normal speech and language. No gross focal neurologic deficits are appreciated.  Skin:  Skin is warm, dry and intact. No rash noted. Psychiatric: Mood and affect are normal. Speech and behavior are normal. Patient exhibits appropriate insight and judgment.  ____________________________________________    LABS (pertinent positives/negatives)  Lactic 3.6 Trop <0.03 UA positive nitrite, large leukocytes, 6-30 wbcs CBC wbc 15.6, hgb 9.2, plt 586 CMP cr 1.22, k 3.4  ____________________________________________   EKG  None  ____________________________________________    RADIOLOGY  CXR No acute disease ____________________________________________   PROCEDURES  Procedures  ____________________________________________   INITIAL IMPRESSION / ASSESSMENT AND PLAN / ED COURSE  Pertinent labs & imaging results that were available during my care of the patient were reviewed by me and considered in my medical decision making (see chart for details).  Patient presented to the emergency department today from rehab facility because of concerns for diarrhea, weakness and some confusion.  Differential would be broad including electrolyte abnormalities, infection, intracranial process, medication side effect amongst other etiologies.  Workup however is most consistent with urosepsis.  Patient will be given IV fluids and antibiotics.  Will plan on admission to the hospital service.   ____________________________________________   FINAL CLINICAL IMPRESSION(S) / ED DIAGNOSES  Final diagnoses:  Dehydration  Elevated lactic acid level  Lower urinary tract infection  Diarrhea, unspecified type     Note: This dictation was prepared with Dragon dictation. Any transcriptional errors that result from this process are unintentional     Nance Pear,  MD 01/03/18 1323

## 2018-01-03 NOTE — ED Notes (Signed)
All pt belongings taken with family. Pt in hospital gown and not in own clothing.

## 2018-01-03 NOTE — ED Triage Notes (Signed)
PAtient arrived from [facility] for N/V/D since this AM around 0700. Recently had vascular surgery on L leg and at [facility] for rehab. EMS vitals 152/62 b/p, RR 22, 96Pulse

## 2018-01-03 NOTE — ED Notes (Signed)
Pt placed on 2L [place] after RN noted decreased oxygenation. MD aware. Pt also sitting gupright to see if positioning helps increase oxygenation.

## 2018-01-03 NOTE — Progress Notes (Signed)
Ch was rounding in ED and Ch was asked for assistance by Pt family friend who with family. Friend asked for Ch to support son. Ch prayed with the family and Pt and was asked to go to consultation room by the son. Son explained issues with health care staff and institutions. Ch offered active listening and probing questioning. Family dynamics has some disfunction, fueling some agitation. Ch said he would help to give son information about care doctor who he had issues with. Ch will follow through to help defuse.  Ch escorted family to tele. And offer prayer and continued ear.    01/03/18 1500  Clinical Encounter Type  Visited With Patient and family together  Visit Type Initial;Spiritual support;Critical Care  Referral From Nurse  Spiritual Encounters  Spiritual Needs Prayer;Emotional

## 2018-01-03 NOTE — ED Notes (Signed)
Pt changed after urinating. Pt able to roll without difficulty for RN.

## 2018-01-03 NOTE — Progress Notes (Signed)
Since 1500 patient has had three watery stools with only small bits of brown fecal matter.  Labs reports GI panel is positive for norovirus.  Enteric precautions were initiated on admission.

## 2018-01-03 NOTE — H&P (Signed)
[facility] [place] NAME: Dawn [name]    MR#:  [id number]  DATE OF BIRTH:  03-24-1928  DATE OF ADMISSION:  01/03/2018  PRIMARY CARE PHYSICIAN: Idelle Crouch, MD   REQUESTING/REFERRING PHYSICIAN:   CHIEF COMPLAINT:   Chief Complaint  Patient presents with  . Emesis  . Diarrhea    HISTORY OF PRESENT ILLNESS: Dawn [name]  is a 84 y.o. female with a known history per below, recently sent to inpatient rehab status post vascular procedure to her lower extremities for peripheral vascular disease, presents with 1-2-day history of worsening nausea, vomiting, diarrhea, generalized weakness, fatigue, altered mental status, most of the information is coming from the patient's son whom has numerous complaints, which also includes chronic cough-no etiology, chest x-ray done on Monday which was negative, concern for oral thrush-has not been treated per's patient's son, 4-5 pound weight loss within the last week or 2,, poor p.o. intake, disputes any history of malnutrition-though records indicate severe protein energy malnutrition/failure to thrive-the patient's son is argumentative when addressing this issue as well as advanced directives, daughter-in-law is also present at the bedside, noted hypoxia-satting 88% on room air, improved with nasal cannula oxygen, ER workup noted for sepsis due to UTI, potassium 3.4, creatinine 1.2, BUN 60, white count 15.6, hemoglobin 9.2, lactic acid 3.6, AST 55, patient is now being admitted for acute sepsis secondary to acute urinary tract infection, acute dehydration, acute lactic acidosis, and diarrhea.  PAST MEDICAL HISTORY:   Past Medical History:  Diagnosis Date  . Cancer ([place])    skin cancer  . 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 Hypertension   . Sciatica   . Sciatica     PAST SURGICAL HISTORY:  Past Surgical History:  Procedure Laterality Date  .  ABDOMINAL HYSTERECTOMY    . APPENDECTOMY    . CATARACT EXTRACTION, BILATERAL    . CHOLECYSTECTOMY    . ENDARTERECTOMY FEMORAL Right 04/19/2017   Procedure: ENDARTERECTOMY COMMON FEMORAL ARTERY, PROFUNDA  FEMORIS ARTERY, and  SUPERFICIAL FEMORAL ARTERY;  Surgeon: Algernon Huxley, MD;  Location: [facility];  Service: Vascular;  Laterality: Right;  . ENDARTERECTOMY FEMORAL Left 11/14/2017   Procedure: ENDARTERECTOMY FEMORAL;  Surgeon: Algernon Huxley, MD;  Location: [facility];  Service: Vascular;  Laterality: Left;  . HIP SURGERY Bilateral   . LOWER EXTREMITY ANGIOGRAM Right 04/19/2017   Procedure: LOWER EXTREMITY ANGIOGRAM WITH SUPERFICIAL FEMORAL ARTERY STENT PLACEMENT;  Surgeon: Algernon Huxley, MD;  Location: [facility];  Service: Vascular;  Laterality: Right;  . LOWER EXTREMITY ANGIOGRAPHY Right 04/18/2017   Procedure: Lower Extremity Angiography;  Surgeon: Algernon Huxley, MD;  Location: [facility];  Service: Cardiovascular;  Laterality: Right;  . LOWER EXTREMITY ANGIOGRAPHY Left 07/12/2017   Procedure: Lower Extremity Angiography;  Surgeon: Algernon Huxley, MD;  Location: [facility];  Service: Cardiovascular;  Laterality: Left;  . LOWER EXTREMITY ANGIOGRAPHY Left 11/09/2017   Procedure: LOWER EXTREMITY ANGIOGRAPHY;  Surgeon: Algernon Huxley, MD;  Location: [facility];  Service: Cardiovascular;  Laterality: Left;  . LOWER EXTREMITY INTERVENTION  07/12/2017   Procedure: Lower Extremity Intervention;  Surgeon: Algernon Huxley, MD;  Location: [facility];  Service: Cardiovascular;;  . LOWER EXTREMITY INTERVENTION  11/09/2017   Procedure: LOWER EXTREMITY INTERVENTION;  Surgeon: Algernon Huxley, MD;  Location: [facility];  Service: Cardiovascular;;  .  RENAL ANGIOGRAPHY N/A 06/21/2017   Procedure: Renal Angiography;  Surgeon: Algernon Huxley, MD;  Location: [facility];  Service: Cardiovascular;  Laterality: N/A;  . SHOULDER SURGERY Bilateral   . TOTAL HIP ARTHROPLASTY  Bilateral     SOCIAL HISTORY:  Social History   Tobacco Use  . Smoking status: Former Smoker    Types: Cigarettes    Last attempt to quit: 1980    Years since quitting: 39.1  . Smokeless tobacco: Never Used  Substance Use Topics  . Alcohol use: No    FAMILY HISTORY:  Family History  Problem Relation Age of Onset  . Diabetes Father   . Heart disease Father     DRUG ALLERGIES:  Allergies  Allergen Reactions  . Percocet [Oxycodone-Acetaminophen] Itching  . Percodan [Oxycodone-Aspirin] Itching    REVIEW OF SYSTEMS:  Most of information is coming from the patient's son/daughter-in-law, patient defers to her family to give details-noted altered mental state per son CONSTITUTIONAL: No fever, fatigue or weakness.  EYES: No blurred or double vision.  EARS, NOSE, AND THROAT: No tinnitus or ear pain.  RESPIRATORY: No cough, shortness of breath, wh  MEDICATIONS AT HOME:  Prior to Admission medications   Medication Sig Start Date End Date Taking? Authorizing Provider  acidophilus (RISAQUAD) CAPS capsule Take 1 capsule by mouth daily. 11/23/17  Yes Algernon Huxley, MD  amLODipine (NORVASC) 10 MG tablet Take 1 tablet (10 mg total) by mouth daily. 11/23/17  Yes Algernon Huxley, MD  aspirin EC 81 MG tablet Take 81 mg by mouth daily.   Yes [provider]  atorvastatin (LIPITOR) 20 MG tablet TAKE 1 TABLET BY MOUTH ONCE DAILY AT  6PM 08/29/17  Yes Dew, Erskine Squibb, MD  Cholecalciferol (VITAMIN D3) 400 units CAPS Take 400 Units by mouth daily.   Yes [provider]  cloNIDine (CATAPRES) 0.1 MG tablet Take 1 tablet (0.1 mg total) by mouth 2 (two) times daily. Patient taking differently: Take 0.1 mg by mouth 3 (three) times daily.  06/08/16  Yes Daymon Larsen, MD  clopidogrel (PLAVIX) 75 MG tablet TAKE 1 TABLET BY MOUTH ONCE DAILY 08/29/17  Yes Dew, Erskine Squibb, MD  Cyanocobalamin (B-12) 5000 MCG CAPS Take 5,000 mcg by mouth daily.    Yes [provider]  guaifenesin  (ROBITUSSIN) 100 MG/5ML syrup Take 400 mg by mouth 3 (three) times daily as needed for cough.   Yes [provider]  hydrochlorothiazide (HYDRODIURIL) 25 MG tablet Take 25 mg by mouth daily.  03/22/17  Yes [provider]  ketorolac (ACULAR) 0.4 % SOLN Place 1 drop into the left eye 3 (three) times daily.    Yes [provider]  Multiple Minerals-Vitamins (LEG-GESIC PO) Take 0.5 tablets by mouth at bedtime. LEGATRIN PM    Yes [provider]  naproxen sodium (ANAPROX) 220 MG tablet Take 440 mg by mouth daily as needed (PAIN).    Yes [provider]  indomethacin (INDOCIN) 50 MG capsule Take 50 mg by mouth 2 (two) times daily as needed (for gout flares).    [provider]  traMADol (ULTRAM) 50 MG tablet Take 1 tablet (50 mg total) by mouth every 6 (six) hours as needed. Patient taking differently: Take 50 mg by mouth daily.  04/20/17   Algernon Huxley, MD  tretinoin (RETIN-A) 0.05 % cream Apply 1 application topically every other day. EVERY OTHER NIGHT    [provider]      PHYSICAL  EXAMINATION:   VITAL SIGNS: Blood pressure (!) 151/53, pulse (!) 107, temperature (!) 97.5 F (36.4 C), temperature source Oral, resp. rate (!) 21, height 5\' 3"  (1.6 m), weight 41.5 kg (91 lb 9.6 oz), SpO2 (!) 87 %.  GENERAL:  84 y.o.-year-old patient lying in the bed with no acute distress.  Malnourished appearance, frailty EYES: Pupils equal, round, reactive to light and accommodation. No scleral icterus. Extraocular muscles intact.  HEENT: Head atraumatic, normocephalic. Oropharynx and nasopharynx clear.  Oral thrush, dry mucous membranes NECK:  Supple, no jugular venous distention. No thyroid enlargement, no tenderness.  Poor skin turgor LUNGS: Normal breath sounds bilaterally, no wheezing, rales,rhonchi or crepitation. No use of accessory muscles of respiration.  CARDIOVASCULAR: S1, S2 normal. No murmurs, rubs, or gallops.  ABDOMEN: Soft, nontender,  nondistended. Bowel sounds present. No organomegaly or mass.  EXTREMITIES: No pedal edema, cyanosis, or clubbing.  Diffuse muscular atrophy NEUROLOGIC: Cranial nerves II through XII are intact. MAES. Gait not checked.  PSYCHIATRIC: The patient is lethargic, arouses easily.  SKIN: No obvious rash, lesion, or ulcer.   LABORATORY PANEL:   CBC Recent Labs  Lab 01/03/18 1141  WBC 15.6*  HGB 9.2*  HCT 29.3*  PLT 586*  MCV 80.3  MCH 25.3*  MCHC 31.5*  RDW 17.1*  LYMPHSABS 0.5*  MONOABS 0.6  EOSABS 0.0  BASOSABS 0.0   ------------------------------------------------------------------------------------------------------------------  Chemistries  Recent Labs  Lab 01/03/18 1141  NA 144  K 3.4*  CL 102  CO2 26  GLUCOSE 144*  BUN 60*  CREATININE 1.22*  CALCIUM 9.3  AST 55*  ALT 21  ALKPHOS 111  BILITOT 0.4   ------------------------------------------------------------------------------------------------------------------ estimated creatinine clearance is 21.3 mL/min (A) (by C-G formula based on SCr of 1.22 mg/dL (H)). ------------------------------------------------------------------------------------------------------------------ No results for input(s): TSH, T4TOTAL, T3FREE, THYROIDAB in the last 72 hours.  Invalid input(s): FREET3   Coagulation profile No results for input(s): INR, PROTIME in the last 168 hours. ------------------------------------------------------------------------------------------------------------------- No results for input(s): DDIMER in the last 72 hours. -------------------------------------------------------------------------------------------------------------------  Cardiac Enzymes Recent Labs  Lab 01/03/18 1141  TROPONINI <0.03   ------------------------------------------------------------------------------------------------------------------ Invalid input(s):  POCBNP  ---------------------------------------------------------------------------------------------------------------  Urinalysis    Component Value Date/Time   COLORURINE YELLOW (A) 01/03/2018 1141   APPEARANCEUR CLOUDY (A) 01/03/2018 1141   LABSPEC 1.015 01/03/2018 1141   PHURINE 8.0 01/03/2018 1141   GLUCOSEU NEGATIVE 01/03/2018 1141   HGBUR SMALL (A) 01/03/2018 1141   BILIRUBINUR NEGATIVE 01/03/2018 1141   KETONESUR NEGATIVE 01/03/2018 1141   PROTEINUR 30 (A) 01/03/2018 1141   NITRITE POSITIVE (A) 01/03/2018 1141   LEUKOCYTESUR LARGE (A) 01/03/2018 1141     RADIOLOGY: Dg Chest Portable 1 View  Result Date: 01/03/2018 CLINICAL DATA:  Nausea, vomiting, diarrhea, cough EXAM: PORTABLE CHEST 1 VIEW COMPARISON:  CT chest 11/16/2015 FINDINGS: There is no focal parenchymal opacity. There is no pleural effusion or pneumothorax. The heart and mediastinal contours are unremarkable. 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 IMPRESSION: No active disease. Electronically Signed   By: Kathreen Devoid   On: 01/03/2018 13:18    EKG: Orders placed or performed during the hospital encounter of 11/09/17  . EKG 12-Lead  . EKG 12-Lead  . EKG 12-Lead  . EKG 12-Lead  . EKG 12-Lead  . EKG 12-Lead    IMPRESSION AND PLAN: 1 acute sepsis secondary to nosocomial acquired UTI Admit on our sepsis protocol, empiric cefepime, IV fluids for rehydration, follow-up on cultures  2 acute urinary tract infection Status post  recent hospital procedure, presenting from inpatient rehab facility Plan of care as stated above  3 acute nausea/vomiting/diarrhea Exact etiology is unknown, ?  Secondary to above versus acute viral enteritis Antiemetics PRN, IV fluids for rehydration, check GI panel, strict I&O monitoring  4 acute dehydration Most likely secondary to above Hold diuretic medicine, IV fluids for rehydration, check BMP in the morning  5 history of adult failure to  thrive/moderate to severe protein energy/calorie malnutrition Noted history of dysphagia Dietary to see, check prealbumin level, resume dysphagia 3 diet per speech therapy last note, ?  Need for possible feeding tube  6 history of COPD/asthma without exacerbation Noted coughing without etiology per son, chest x-ray was unremarkable Will check CT chest for further evaluation, breathing treatments every 6 hours, respiratory therapy to see, start inhaled corticosteroids twice daily, hold off on IV steroids, and continue close medical monitoring  7 chronic benign essential hypertension Stable Continue home regiment with exception of hydrochlorothiazide given acute dehydration  8 chronic peripheral vascular disease Stable Continue dual antiplatelet therapy with aspirin, Plavix, statin therapy  9 acute thrush, minimal Nystatin swish and swallow for short course  10 chronic gout without exacerbation Stable Continue indomethacin as needed    All the records are reviewed and case discussed with ED provider. Management plans discussed with the patient, family and they are in agreement.  CODE STATUS: Code Status History    Date Active Date Inactive Code Status Order ID Comments User Context   11/09/2017 11:31 11/22/2017 19:13 Full Code [id number]  Leonides Schanz Inpatient   11/09/2017 10:19 11/09/2017 11:22 Full Code [id number]  Leonides Schanz Outpatient   11/09/2017 10:19 11/09/2017 10:19 Full Code [id number]  Algernon Huxley, MD Outpatient   07/12/2017 10:05 07/12/2017 16:56 Full Code [id number]  Algernon Huxley, MD Inpatient   06/21/2017 11:25 06/21/2017 16:22 Full Code [id number]  Algernon Huxley, MD Inpatient   04/18/2017 11:50 04/18/2017 13:02 Full Code [id number]  Algernon Huxley, MD Inpatient   04/18/2017 11:04 04/18/2017 11:50 Full Code [id number]  Algernon Huxley, MD Inpatient       TOTAL TIME TAKING CARE OF THIS PATIENT: 45 minutes of critical care time was used to discuss plan  of care with the patient's son, the patient, the patient's daughter-in-law, ancillary staff, subspecialty/ED staff, with all questions answered.    Avel Peace Wong Steadham M.D on 01/03/2018   Between 7am to 6pm - Pager - [phone]  After 6pm go to [email or web address] - password [email or web address] [facility]  Office  [phone]  CC: Primary care physician; Idelle Crouch, MD   Note: This dictation was prepared with Dragon dictation along with smaller phrase technology. Any transcriptional errors that result from this process are unintentional.

## 2018-01-03 NOTE — ED Notes (Signed)
Pt requested thickened liquids. Pt able to take two sips before vomiting moderate amount of liquid. Pt cleaned by this RN. NAD at this time.

## 2018-01-03 NOTE — Progress Notes (Signed)
CODE SEPSIS - PHARMACY COMMUNICATION  **Broad Spectrum Antibiotics should be administered within 1 hour of Sepsis diagnosis**  Time Code Sepsis Called/Page Received: 1230  Antibiotics Ordered: CTX  Time of 1st antibiotic administration: 1302  Additional action taken by pharmacy:   If necessary, Name of Provider/Nurse Contacted:     Napoleon Form ,PharmD Clinical Pharmacist  01/03/2018  12:37 PM

## 2018-01-04 DIAGNOSIS — L8990 Pressure ulcer of unspecified site, unspecified stage: Secondary | ICD-10-CM

## 2018-01-04 LAB — FOLATE: Folate: 11.1 ng/mL (ref >5.9)

## 2018-01-04 LAB — BASIC METABOLIC PANEL
Anion gap: 9 (ref 5–15)
BUN: 39 mg/dL — ABNORMAL HIGH (ref 6–20)
CALCIUM: 7.5 mg/dL — AB (ref 8.9–10.3)
CHLORIDE: 113 mmol/L — AB (ref 101–111)
CO2: 23 mmol/L (ref 22–32)
Creatinine, Ser: 0.86 mg/dL (ref 0.44–1.00)
GFR calc non Af Amer: 59 mL/min — ABNORMAL LOW (ref >60)
GLUCOSE: 107 mg/dL — AB (ref 65–99)
Potassium: 2.9 mmol/L — ABNORMAL LOW (ref 3.5–5.1)
Sodium: 145 mmol/L (ref 135–145)

## 2018-01-04 LAB — IRON AND TIBC
IRON: 7 ug/dL — AB (ref 28–170)
Saturation Ratios: 3 % — ABNORMAL LOW (ref 10.4–31.8)
TIBC: 204 ug/dL — AB (ref 250–450)
UIBC: 197 ug/dL

## 2018-01-04 LAB — PREPARE RBC (CROSSMATCH)

## 2018-01-04 LAB — CBC
HCT: 20 % — ABNORMAL LOW (ref 35.0–47.0)
HEMOGLOBIN: 6.3 g/dL — AB (ref 12.0–16.0)
MCH: 25.5 pg — AB (ref 26.0–34.0)
MCHC: 31.4 g/dL — ABNORMAL LOW (ref 32.0–36.0)
MCV: 81.4 fL (ref 80.0–100.0)
PLATELETS: 383 10*3/uL (ref 150–440)
RBC: 2.45 MIL/uL — AB (ref 3.80–5.20)
RDW: 16.7 % — ABNORMAL HIGH (ref 11.5–14.5)
WBC: 16.6 10*3/uL — ABNORMAL HIGH (ref 3.6–11.0)

## 2018-01-04 LAB — VITAMIN B12: Vitamin B-12: >7500 pg/mL — ABNORMAL HIGH (ref 180–914)

## 2018-01-04 LAB — HEMOGLOBIN AND HEMATOCRIT, BLOOD
HEMATOCRIT: 23.4 % — AB (ref 35.0–47.0)
Hemoglobin: 7.5 g/dL — ABNORMAL LOW (ref 12.0–16.0)

## 2018-01-04 LAB — FERRITIN: FERRITIN: 77 ng/mL (ref 11–307)

## 2018-01-04 LAB — RETICULOCYTES
RBC.: 2.55 MIL/uL — ABNORMAL LOW (ref 3.80–5.20)
Retic Count, Absolute: 61.2 10*3/uL (ref 19.0–183.0)
Retic Ct Pct: 2.4 % (ref 0.4–3.1)

## 2018-01-04 LAB — MAGNESIUM: MAGNESIUM: 1.8 mg/dL (ref 1.7–2.4)

## 2018-01-04 MED ORDER — DIPHENHYDRAMINE HCL 25 MG PO CAPS
25.0000 mg | ORAL_CAPSULE | Freq: Once | ORAL | Status: AC
  Administered 2018-01-04: 25 mg via ORAL
  Filled 2018-01-04: qty 1

## 2018-01-04 MED ORDER — SODIUM CHLORIDE 0.9 % IV SOLN
INTRAVENOUS | Status: DC
  Administered 2018-01-04 – 2018-01-09 (×6): via INTRAVENOUS

## 2018-01-04 MED ORDER — ACETAMINOPHEN 325 MG PO TABS
650.0000 mg | ORAL_TABLET | Freq: Once | ORAL | Status: AC
  Administered 2018-01-04: 650 mg via ORAL
  Filled 2018-01-04: qty 2

## 2018-01-04 MED ORDER — SODIUM CHLORIDE 0.9 % IV SOLN
Freq: Once | INTRAVENOUS | Status: AC
  Administered 2018-01-05: 06:00:00 via INTRAVENOUS

## 2018-01-04 MED ORDER — POTASSIUM CHLORIDE CRYS ER 20 MEQ PO TBCR
40.0000 meq | EXTENDED_RELEASE_TABLET | Freq: Once | ORAL | Status: AC
  Administered 2018-01-04: 40 meq via ORAL
  Filled 2018-01-04: qty 2

## 2018-01-04 MED ORDER — PANTOPRAZOLE SODIUM 40 MG IV SOLR
40.0000 mg | Freq: Two times a day (BID) | INTRAVENOUS | Status: DC
  Administered 2018-01-04 – 2018-01-09 (×11): 40 mg via INTRAVENOUS
  Filled 2018-01-04 (×11): qty 40

## 2018-01-04 MED ORDER — SODIUM CHLORIDE 0.9 % IV SOLN
Freq: Once | INTRAVENOUS | Status: AC
  Administered 2018-01-04: 22:00:00 via INTRAVENOUS

## 2018-01-04 MED ORDER — DEXTROSE 5 % IV SOLN
1.0000 g | INTRAVENOUS | Status: DC
  Administered 2018-01-04 – 2018-01-06 (×3): 1 g via INTRAVENOUS
  Filled 2018-01-04 (×4): qty 10

## 2018-01-04 NOTE — Progress Notes (Signed)
[facility] at [place] NAME: Dawn [name]    MR#:  [id number]  DATE OF BIRTH:  1928-02-04  SUBJECTIVE:   Patient's diarrhea has improved. Son is at bedside and has many questions today.  REVIEW OF SYSTEMS:    Review of Systems  Constitutional: Negative for fever, chills weight loss ++Generalized weakness HENT: Negative for ear pain, nosebleeds, congestion, facial swelling, rhinorrhea, neck pain, neck stiffness and ear discharge.   Respiratory: Negative for cough, shortness of breath, wheezing  Cardiovascular: Negative for chest pain, palpitations and leg swelling.  Gastrointestinal: Negative for heartburn, abdominal pain, vomiting, diarrhea (IMPROVED) NO consitpation Genitourinary: Negative for dysuria, urgency, frequency, hematuria Musculoskeletal: Negative for back pain or joint pain Neurological: Negative for dizziness, seizures, syncope, focal weakness,  numbness and headaches.  Hematological: Does not bruise/bleed easily.  Psychiatric/Behavioral: Negative for hallucinations, confusion, dysphoric mood    Tolerating Diet: yes      DRUG ALLERGIES:   Allergies  Allergen Reactions  . Percocet [Oxycodone-Acetaminophen] Itching  . Percodan [Oxycodone-Aspirin] Itching    VITALS:  Blood pressure (!) 127/48, pulse 91, temperature 98.4 F (36.9 C), temperature source Oral, resp. rate 18, height 5\' 3"  (1.6 m), weight 43 kg (94 lb 14.4 oz), SpO2 90 %.  PHYSICAL EXAMINATION:  Constitutional: Appears thin and frail No distress. HENT: Normocephalic. Marland Kitchen Oropharynx is clear and moist.  No thrush noted Eyes: Conjunctivae and EOM are normal. PERRLA, no scleral icterus.  Neck: Normal ROM. Neck supple. No JVD. No tracheal deviation. CVS: RRR, S1/S2 +, no murmurs, no gallops, no carotid bruit.  Pulmonary: Decreased breath sounds throughout lung fields without rales or rhonchi Abdominal: Soft. BS +,  no distension, tenderness, rebound or  guarding.  Musculoskeletal: Normal range of motion. No edema and no tenderness.  Neuro: Alert. CN 2-12 grossly intact. No focal deficits. Skin: Bruising noted lower extremities  Psychiatric: Normal mood and affect.      LABORATORY PANEL:   CBC Recent Labs  Lab 01/04/18 0352  WBC 16.6*  HGB 6.3*  HCT 20.0*  PLT 383   ------------------------------------------------------------------------------------------------------------------  Chemistries  Recent Labs  Lab 01/03/18 1549 01/04/18 0352  NA 144 145  K 2.9* 2.9*  CL 110 113*  CO2 21* 23  GLUCOSE 138* 107*  BUN 46* 39*  CREATININE 0.96 0.86  CALCIUM 7.6* 7.5*  AST 54*  --   ALT 23  --   ALKPHOS 110  --   BILITOT 0.2*  --    ------------------------------------------------------------------------------------------------------------------  Cardiac Enzymes Recent Labs  Lab 01/03/18 1141  TROPONINI <0.03   ------------------------------------------------------------------------------------------------------------------  RADIOLOGY:  Ct Chest Wo Contrast  Result Date: 01/03/2018 CLINICAL DATA:  Worsening oxygenation. Chronic cough and weakness. Negative chest x-ray. EXAM: CT CHEST WITHOUT CONTRAST TECHNIQUE: Multidetector CT imaging of the chest was performed following the standard protocol without IV contrast. COMPARISON:  Chest radiography same day.  CT chest 11/18/2015. FINDINGS: Cardiovascular: Aortic atherosclerotic calcification. Maximal diameter of the ascending aorta 3.4 cm. Extensive coronary artery calcification. Heart size is normal. No pericardial fluid. Mediastinum/Nodes: No mass or adenopathy seen without contrast. Lungs/Pleura: Background pattern of pulmonary scarring and emphysematous change without dominant blebs or bullae. Infiltrate in the posterior right lower lobe consistent with bronchopneumonia. No dense consolidation or lobar collapse. Patchy areas of scarring throughout both lungs, slightly  progressive since the previous exam. No suspicion of neoplastic mass lesion. Upper Abdomen: Negative except for vascular calcification. Musculoskeletal: Chronic spinal degenerative changes. IMPRESSION: Bronchopneumonia affecting the posterior aspect  of the right lower lobe. Background emphysema and pulmonary scarring which is progressive over time. Advanced aortic atherosclerosis and coronary artery calcification. Electronically Signed   By: Nelson Chimes M.D.   On: 01/03/2018 19:29   Dg Chest Portable 1 View  Result Date: 01/03/2018 CLINICAL DATA:  Nausea, vomiting, diarrhea, cough EXAM: PORTABLE CHEST 1 VIEW COMPARISON:  CT chest 11/16/2015 FINDINGS: There is no focal parenchymal opacity. There is no pleural effusion or pneumothorax. The heart and mediastinal contours are unremarkable. 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 IMPRESSION: No active disease. Electronically Signed   By: Kathreen Devoid   On: 01/03/2018 13:18     ASSESSMENT AND PLAN:   84 year old female with a history essential hypertension who presents from inpatient rehabilitation due to diarrhea and generalized weakness.   1. Sepsis: Patient presents with tachypnea and tachycardia as well as leukocytosis Sepsis is due to UTI Follow up final blood and urine culture Follow lactic acid level WBC improving  2. UTI: Continue Rocephin and follow up on urine culture  3. Diarrhea with Norovirus Continue supportive management with IV fluids  4. Acute on chronic blood loss anemia: Anemia panel ordered Patient consented for blood transfusion 1 unit today GI consultation requested Start Protonix 40 IV every 12 Stop Plavix/aspirin for now Further recommendations after GI consultation  5. Hypokalemia: Check magnesium and replete  6. Protein calorie malnutrition, severe: Dietitian consultation  7. History of COPD without exacerbation  8. PVD: Hold Plavix for now due to anemia Continue  atorvastatin 9. Essential hypertension: Continue Norvasc, clonidine Management plans discussed with the patient and son and they are in agreement.  CODE STATUS: FULL  TOTAL TIME TAKING CARE OF THIS PATIENT: 30 minutes.   Physical therapy consultation for discharge planning  POSSIBLE D/C 2-3 days, DEPENDING ON CLINICAL CONDITION.   Jayveion Stalling M.D on 01/04/2018 at 10:34 AM  Between 7am to 6pm - Pager - [phone] After 6pm go to [email or web address] - password [email or web address] [facility]  Office  [phone]  CC: Primary care physician; Idelle Crouch, MD  Note: This dictation was prepared with Dragon dictation along with smaller phrase technology. Any transcriptional errors that result from this process are unintentional.

## 2018-01-04 NOTE — Progress Notes (Signed)
DR. Alice Reichert called about patient's Hgb 7.5  Patient had dark red bloody stool, large amount x 2. Per Dr. Alice Reichert tell prime doctor to order 2 units of blood. Dr. Jannifer Franklin notified and received the order. Will continue to monitor.

## 2018-01-04 NOTE — Progress Notes (Addendum)
Patient's Niece or daughter wants the patient to be placed on a [employer] low air rotating mattress.  A physician order is needed which willl be obtained in the morning as [employer] does not deliver at night.  Patient can not have thin liquids such as ice chips as she has a choking problem and has been on a dysphagia III diet and nectar thick liquids for several weeks.  I explained this to the family and the patient.  The family was very unhappy about this news and told me many times that the GI physician told them she could have ice chips.  She can any quantity of thickened liquids that she wants

## 2018-01-04 NOTE — Consult Note (Signed)
GI Inpatient Consult Note  Reason for Consult: GI Bleed   Attending Requesting Consult: Dr. Benjie Karvonen   History of Present Illness: Dawn [name] is a 84 y.o. female seen for evaluation of GI bleed at the request of Dr Benjie Karvonen.   Most of history obtained via son given mental status. She has multiple chronic comorbidities.  Son reports that she had 2 vascular procedures back in December.  She was discharged to rehab at that time.  After discharge to rehab she began losing weight, poor appetite.  Approximately 3-4 days ago she developed some vomiting with diarrhea at that time.  Continue to have poor appetite and generally not feeling well.  Yesterday had episode of acute mental status change and was brought to the emergency room.  She was diagnosed with [place] virus and started on sepsis protocol.  Nausea vomiting have improved but she has continued to have diarrhea.  Multiple stools throughout the day.  She had been on a soft to liquid diet until today.  She ate fish and carrots for lunch and is currently having a milkshake.  Per son she was using naproxen at least once a day but possibly more for joint pain.  She was also on Plavix as well as aspirin 81 mg.  She does not have a PPI on her medicine list.  She denies any heartburn reflux complaints.  Nurse at bedside during interview and case discussed with the nurse.  Was having brown watery stools which have turned darker over the past few hours. patient had episode of passing large amount of melena w/ maroon tinge. She is currently getting a unit of PRBCs. She last got plavix at 10am today.    Last Colonoscopy: None prior  Last Endoscopy: None prior    Past Medical History:  Past Medical History:  Diagnosis Date  . Cancer ([place])    skin cancer  . 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 Hypertension   . Sciatica   . Sciatica     Problem List: Patient Active Problem List   Diagnosis Date  Noted  . Pressure injury of skin 01/04/2018  . Sepsis ([place]) 01/03/2018  . Protein-calorie malnutrition, severe 11/14/2017  . Atherosclerosis of native arteries of extremity with rest pain ([place]) 11/09/2017  . Ischemic leg 11/09/2017  . Atherosclerotic peripheral vascular disease with rest pain ([place]) 11/09/2017  . Atypical chest pain 09/21/2017  . Renal artery stenosis ([place]) 06/15/2017  . Hyperlipidemia 05/09/2017  . Essential hypertension 10/17/2016  . Carotid stenosis 10/17/2016  . PVD (peripheral vascular disease) ([place]) 10/17/2016  . B12 deficiency 07/19/2016  . Vitamin D deficiency, unspecified 07/19/2016  . History of right hip replacement 11/04/2015  . Aftercare following bilateral hip joint replacement surgery 11/05/2014  . SCC (squamous cell carcinoma) 05/21/2014  . COPD with asthma ([place]) 03/16/2014  . S/P hip replacement 10/03/2012    Past Surgical History: Past Surgical History:  Procedure Laterality Date  . ABDOMINAL HYSTERECTOMY    . APPENDECTOMY    . CATARACT EXTRACTION, BILATERAL    . CHOLECYSTECTOMY    . ENDARTERECTOMY FEMORAL Right 04/19/2017   Procedure: ENDARTERECTOMY COMMON FEMORAL ARTERY, PROFUNDA  FEMORIS ARTERY, and  SUPERFICIAL FEMORAL ARTERY;  Surgeon: Algernon Huxley, MD;  Location: [facility] OR[facility];  Service: Vascular;  Laterality: Right;  . ENDARTERECTOMY FEMORAL Left 11/14/2017   Procedure: ENDARTERECTOMY FEMORAL;  Surgeon: Algernon Huxley, MD;  Location: [facility] OR[facility];  Service: Vascular;  Laterality: Left;  . HIP SURGERY Bilateral   . LOWER EXTREMITY ANGIOGRAM Right 04/19/2017   Procedure: LOWER EXTREMITY ANGIOGRAM WITH SUPERFICIAL FEMORAL ARTERY STENT PLACEMENT;  Surgeon: Algernon Huxley, MD;  Location: [facility];  Service: Vascular;  Laterality: Right;  . LOWER EXTREMITY ANGIOGRAPHY Right 04/18/2017   Procedure: Lower Extremity Angiography;  Surgeon: Algernon Huxley, MD;  Location: [place] [facility];  Service: Cardiovascular;  Laterality: Right;  . LOWER EXTREMITY ANGIOGRAPHY  Left 07/12/2017   Procedure: Lower Extremity Angiography;  Surgeon: Algernon Huxley, MD;  Location: [place] [facility];  Service: Cardiovascular;  Laterality: Left;  . LOWER EXTREMITY ANGIOGRAPHY Left 11/09/2017   Procedure: LOWER EXTREMITY ANGIOGRAPHY;  Surgeon: Algernon Huxley, MD;  Location: [place] [facility];  Service: Cardiovascular;  Laterality: Left;  . LOWER EXTREMITY INTERVENTION  07/12/2017   Procedure: Lower Extremity Intervention;  Surgeon: Algernon Huxley, MD;  Location: [place] [facility];  Service: Cardiovascular;;  . LOWER EXTREMITY INTERVENTION  11/09/2017   Procedure: LOWER EXTREMITY INTERVENTION;  Surgeon: Algernon Huxley, MD;  Location: [place] [facility];  Service: Cardiovascular;;  . RENAL ANGIOGRAPHY N/A 06/21/2017   Procedure: Renal Angiography;  Surgeon: Algernon Huxley, MD;  Location: [place] [facility];  Service: Cardiovascular;  Laterality: N/A;  . SHOULDER SURGERY Bilateral   . TOTAL HIP ARTHROPLASTY Bilateral     Allergies: Allergies  Allergen Reactions  . Percocet [Oxycodone-Acetaminophen] Itching  . Percodan [Oxycodone-Aspirin] Itching    Home Medications: Medications Prior to Admission  Medication Sig Dispense Refill Last Dose  . acidophilus (RISAQUAD) CAPS capsule Take 1 capsule by mouth daily. 30 capsule 3 01/03/2018 at Unknown time  . amLODipine (NORVASC) 10 MG tablet Take 1 tablet (10 mg total) by mouth daily. 30 tablet 3 01/03/2018 at Unknown time  . aspirin EC 81 MG tablet Take 81 mg by mouth daily.   01/03/2018 at Unknown time  . atorvastatin (LIPITOR) 20 MG tablet TAKE 1 TABLET BY MOUTH ONCE DAILY AT  6PM 30 tablet 3 01/03/2018 at Unknown time  . Cholecalciferol (VITAMIN D3) 400 units CAPS Take 400 Units by mouth daily.   01/03/2018 at Unknown time  . cloNIDine (CATAPRES) 0.1 MG tablet Take 1 tablet (0.1 mg total) by mouth 2 (two) times daily. (Patient taking differently: Take 0.1 mg by mouth 3 (three) times daily. ) 60 tablet 0 01/03/2018 at Unknown time  .  clopidogrel (PLAVIX) 75 MG tablet TAKE 1 TABLET BY MOUTH ONCE DAILY 30 tablet 3 01/03/2018 at Unknown time  . Cyanocobalamin (B-12) 5000 MCG CAPS Take 5,000 mcg by mouth daily.    01/03/2018 at Unknown time  . guaifenesin (ROBITUSSIN) 100 MG/5ML syrup Take 400 mg by mouth 3 (three) times daily as needed for cough.   01/03/2018 at Unknown time  . hydrochlorothiazide (HYDRODIURIL) 25 MG tablet Take 25 mg by mouth daily.    01/03/2018 at Unknown time  . ketorolac (ACULAR) 0.4 % SOLN Place 1 drop into the left eye 3 (three) times daily.    01/03/2018 at Unknown time  . Multiple Minerals-Vitamins (LEG-GESIC PO) Take 0.5 tablets by mouth at bedtime. LEGATRIN PM    01/03/2018 at Unknown time  . naproxen sodium (ANAPROX) 220 MG tablet Take 440 mg by mouth daily as needed (PAIN).    01/03/2018 at Unknown time  . indomethacin (INDOCIN) 50 MG capsule Take 50 mg by mouth 2 (two) times daily as needed (for gout flares).   PRN at PRN  .  traMADol (ULTRAM) 50 MG tablet Take 1 tablet (50 mg total) by mouth every 6 (six) hours as needed. (Patient taking differently: Take 50 mg by mouth daily. ) 30 tablet 1 PRN at PRN  . tretinoin (RETIN-A) 0.05 % cream Apply 1 application topically every other day. EVERY OTHER NIGHT   PRN at PRN   Home medication reconciliation was completed with the patient.   Scheduled Inpatient Medications:   . acidophilus  1 capsule Oral Daily  . amLODipine  10 mg Oral Daily  . atorvastatin  20 mg Oral q1800  . budesonide (PULMICORT) nebulizer solution  0.5 mg Nebulization BID  . chlorhexidine  15 mL Mouth Rinse BID  . cholecalciferol  400 Units Oral Daily  . cloNIDine  0.1 mg Oral TID  . heparin  5,000 Units Subcutaneous Q8H  . ketorolac  1 drop Left Eye TID  . mouth rinse  15 mL Mouth Rinse q12n4p  . multivitamin with minerals  1 tablet Oral QHS  . pantoprazole (PROTONIX) IV  40 mg Intravenous Q12H  . sodium chloride flush  3 mL Intravenous Q12H  . sodium chloride flush  3 mL Intravenous Q12H  .  vitamin B-12  5,000 mcg Oral Daily    Continuous Inpatient Infusions:   . sodium chloride    . sodium chloride 50 mL/hr at 01/04/18 1230  . cefTRIAXone (ROCEPHIN)  IV 1 g (01/04/18 1236)  . sodium chloride     And  . sodium chloride      PRN Inpatient Medications:  acetaminophen **OR** acetaminophen, guaifenesin, indomethacin, ipratropium-albuterol, ondansetron **OR** ondansetron (ZOFRAN) IV, traMADol  Family History: family history includes Diabetes in her father; Heart disease in her father.  The patient's family history is negative for inflammatory bowel disorders, GI malignancy, or solid organ transplantation.  Social History:   reports that she quit smoking about 39 years ago. Her smoking use included cigarettes. she has never used smokeless tobacco. She reports that she does not drink alcohol or use drugs. The patient denies ETOH, tobacco, or drug use.   Review of Systems: Constitutional: Weight is stable.  Eyes: No changes in vision. ENT: No oral lesions, sore throat.  GI: see HPI.  Heme/Lymph: No easy bruising.  CV: No chest pain.  GU: No hematuria.  Integumentary: No rashes.  Neuro: No headaches.  Psych: No depression/anxiety.  Endocrine: No heat/cold intolerance.  Allergic/Immunologic: No urticaria.  Resp: No cough, SOB.  Musculoskeletal: No joint swelling.    Physical Examination: BP (!) 118/37   Pulse 89   Temp 97.9 F (36.6 C)   Resp 16   Ht 5\' 3"  (1.6 m)   Wt 43 kg (94 lb 14.4 oz)   SpO2 97%   BMI 16.81 kg/m  Gen: NAD, alert and oriented. Minimal response from pt during history.  HEENT: PEERLA, EOMI, Neck: supple, no JVD or thyromegaly Chest: CTA bilaterally, no wheezes, crackles, or other adventitious sounds. On 2 L o2.  CV: RRR, no m/g/c/r Abd: soft, NT, ND, +BS in all four quadrants; no HSM, guarding, ridigity, or rebound tenderness RECTAL-large amount of dark melena w/ maroon tinge in bed. Grossly occult blood positive.  Ext: no edema, well  perfused with 2+ pulses, Skin: no rash or lesions noted Lymph: no LAD  Data: Lab Results  Component Value Date   WBC 16.6 (H) 01/04/2018   HGB 6.3 (L) 01/04/2018   HCT 20.0 (L) 01/04/2018   MCV 81.4 01/04/2018   PLT 383 01/04/2018   Recent Labs  Lab 01/03/18 1141 01/03/18 1549 01/04/18 0352  HGB 9.2* 7.1* 6.3*   Lab Results  Component Value Date   NA 145 01/04/2018   K 2.9 (L) 01/04/2018   CL 113 (H) 01/04/2018   CO2 23 01/04/2018   BUN 39 (H) 01/04/2018   CREATININE 0.86 01/04/2018   Lab Results  Component Value Date   ALT 23 01/03/2018   AST 54 (H) 01/03/2018   ALKPHOS 110 01/03/2018   BILITOT 0.2 (L) 01/03/2018   Recent Labs  Lab 01/03/18 1549  APTT 30  INR 1.11   Assessment/Plan: Ms. Estabrook is a 84 y.o. female admitted for n/v/d w/ mental status change.   1. Anemia - acute blood loss - 3 gram drop in hgb over past 24 hours with large amount of obvious bleeding. Given prior plavix, daily nsaid use concern for peptic ulcer as source. Differential also includes AVM, etc. plavix has been d/c. Need to d/c heparin give overt GI bleeding with 3 gram drop. Repeat H/h pending after current unit completes tranfusing. Recommend PPI gtt. Diet switched to NPO.  Dr. Alice Reichert to discuss possible EGD and timing w/ family. Patient last ate around 3:30pm and had baked fish and milkshake.   2. Diarrhea - likely 2/2 norovirus but needs testing for C. Diff as well given recent hospitalizations. Continue supportive care.   3. Hypokalemia - replace per primary team.   Case discussed in detail w/ Dr. Alice Reichert who will determine final treatment plan.Thank you for the consult. Please call with questions or concerns.  Laurine Blazer, PA-C [place]

## 2018-01-04 NOTE — Progress Notes (Signed)
Pharmacy consulted for electrolyte replacement protocol:   Goal of therapy: Electrolytes within normal limits:  K 3.5 - 5.1 Corrected Ca 8.9 - 10.3 Phos 2.5 - 4.6 Mg 1.7 - 2.4   Assessment: Lab Results  Component Value Date   CREATININE 0.86 01/04/2018   BUN 39 (H) 01/04/2018   NA 145 01/04/2018   K 2.9 (L) 01/04/2018   CL 113 (H) 01/04/2018   CO2 23 01/04/2018    Plan: KCL 37meq po x 1 dose, patient has orders for discharge    Thomasenia Sales, PharmD, MBA, [facility]

## 2018-01-04 NOTE — Evaluation (Signed)
Physical Therapy Evaluation Patient Details Name: Dawn [name] MRN: [id number] DOB: 1928/07/12 Today's Date: 01/04/2018   History of Present Illness  84 y.o. female with norovirus was readmitted after recent L femoral endarterectomy, went to SNF and back to [facility].  Has UTI, elevated lactic acid, tachypnea, leukocytosis and Sepsis, awaiting a transfusion.  PMHx:  COPD, PVD, HTN  Clinical Impression  Pt is up to side of bed with assistance, very weak and stood x 1 with mod assist.  Needs nearly total assist to get back to bed and was mainly not able to use arms and legs effectively in the bed to assist scooting up.  Pt is covered in broken skin on legs, very fragile and awaiting a transfusion.  Recommend PT send back to SNF where she looks like a candidate for LT care if the pt is willing to consider this.    Follow Up Recommendations SNF    Equipment Recommendations  None recommended by PT    Recommendations for Other Services       Precautions / Restrictions Precautions Precautions: Fall(telemetry) Precaution Comments: has purwick, ENTERIC Restrictions Weight Bearing Restrictions: No      Mobility  Bed Mobility Overal bed mobility: Needs Assistance Bed Mobility: Supine to Sit;Sit to Supine     Supine to sit: Mod assist Sit to supine: Max assist   General bed mobility comments: pt has pain from previous vacular issues and norovirus related peri pain, incontinent of bladder and bowels  Transfers Overall transfer level: Needs assistance Equipment used: Rolling walker (2 wheeled);1 person hand held assist Transfers: Sit to/from Stand Sit to Stand: Mod assist         General transfer comment: assistance with all mobility and pt does not follow hand placement cues  Ambulation/Gait             General Gait Details: unable  Stairs            Wheelchair Mobility    Modified Rankin (Stroke Patients Only)       Balance Overall balance assessment:  Needs assistance Sitting-balance support: Feet supported;Bilateral upper extremity supported Sitting balance-Leahy Scale: Poor     Standing balance support: Bilateral upper extremity supported;During functional activity Standing balance-Leahy Scale: Poor                               Pertinent Vitals/Pain Pain Assessment: Faces Faces Pain Scale: Hurts little more Pain Location: legs and peri area Pain Descriptors / Indicators: Tender Pain Intervention(s): Limited activity within patient's tolerance;Monitored during session;Repositioned    Home Living Family/patient expects to be discharged to:: Skilled nursing facility                 Additional Comments: has been home alone in level home with one step no rails to enter    Prior Function Level of Independence: Needs assistance   Gait / Transfers Assistance Needed: has just recently been assisted with gait in SNF but not ambulatory yet  ADL's / Homemaking Assistance Needed: living in SNF        Hand Dominance   Dominant Hand: Right    Extremity/Trunk Assessment   Upper Extremity Assessment Upper Extremity Assessment: Generalized weakness    Lower Extremity Assessment Lower Extremity Assessment: Generalized weakness    Cervical / Trunk Assessment Cervical / Trunk Assessment: Kyphotic  Communication   Communication: No difficulties  Cognition Arousal/Alertness: Awake/alert Behavior During Therapy: Anxious Overall Cognitive  Status: Within Functional Limits for tasks assessed                                 General Comments: son was present and answering for pt before PT could ask her about PLOF      General Comments General comments (skin integrity, edema, etc.): pt has multiple areas of broken skin and bruises on legs, hgb is 6 and preparing for a transfusion in a short time    Exercises     Assessment/Plan    PT Assessment Patient needs continued PT services  PT Problem  List Decreased strength;Decreased range of motion;Decreased activity tolerance;Decreased balance;Decreased mobility;Decreased coordination;Decreased cognition;Decreased knowledge of use of DME;Decreased safety awareness;Decreased skin integrity;Pain;Other (comment)(significant mm wasting and joint deterioration)       PT Treatment Interventions DME instruction;Functional mobility training;Therapeutic activities;Therapeutic exercise;Balance training;Neuromuscular re-education;Patient/family education;Gait training    PT Goals (Current goals can be found in the Care Plan section)  Acute Rehab PT Goals Patient Stated Goal: to try to stand but to not hurt with movement PT Goal Formulation: With patient/family Time For Goal Achievement: 01/18/18 Potential to Achieve Goals: Fair    Frequency Min 2X/week   Barriers to discharge Other (comment)(will return to SNF) recommend LT placement in SNF    Co-evaluation               AM-PAC PT "6 Clicks" Daily Activity  Outcome Measure Difficulty turning over in bed (including adjusting bedclothes, sheets and blankets)?: Unable Difficulty moving from lying on back to sitting on the side of the bed? : Unable Difficulty sitting down on and standing up from a chair with arms (e.g., wheelchair, bedside commode, etc,.)?: Unable Help needed moving to and from a bed to chair (including a wheelchair)?: Total Help needed walking in hospital room?: Total Help needed climbing 3-5 steps with a railing? : Total 6 Click Score: 6    End of Session Equipment Utilized During Treatment: Gait belt;Oxygen Activity Tolerance: Patient limited by fatigue;Patient limited by pain;Treatment limited secondary to medical complications (Comment)(anemia and cold/fatigue from the hgb) Patient left: in bed;with call bell/phone within reach;with bed alarm set;with nursing/sitter in room;with family/visitor present Nurse Communication: Mobility status;Other (comment)(pt needs  cleaned up from incontinent episode) PT Visit Diagnosis: Unsteadiness on feet (R26.81);Muscle weakness (generalized) (M62.81);Difficulty in walking, not elsewhere classified (R26.2);Adult, failure to thrive (R62.7);Pain Pain - Right/Left: (B legs and peri area) Pain - part of body: Leg(perineum)    Time: 9798-9211 PT Time Calculation (min) (ACUTE ONLY): 27 min   Charges:   PT Evaluation $PT Eval Moderate Complexity: 1 Mod PT Treatments $Therapeutic Activity: 8-22 mins   PT G Codes:   PT G-Codes **NOT FOR INPATIENT CLASS** Functional Assessment Tool Used: AM-PAC 6 Clicks Basic Mobility    Ramond Dial 01/04/2018, 12:11 PM   Mee Hives, PT MS Acute Rehab Dept. Number: [place]

## 2018-01-04 NOTE — Progress Notes (Signed)
Initial Nutrition Assessment  DOCUMENTATION CODES:   Severe malnutrition in context of social or environmental circumstances  INTERVENTION:  Magic cup TID with meals, each supplement provides 290 kcal and 9 grams of protein  Vital Cuisine BID, each supplement provides 520kcal and 22g of protein.   Monitor intake and GOC  NUTRITION DIAGNOSIS:   Severe Malnutrition related to social / environmental circumstances as evidenced by severe fat depletion, severe muscle depletion  GOAL:   Patient will meet greater than or equal to 90% of their needs  MONITOR:   PO intake, I & O's, Labs, Supplement acceptance, Skin, Weight trends  REASON FOR ASSESSMENT:   Consult Assessment of nutrition requirement/status  ASSESSMENT:   Dawn [name] is an 84 yo female with PMH Skin Cancer, HTN, PVD,  FTT, presents with 1-2 days of nausea, vomiting, diarrhea, generalized weakness, fatigue and altered mental status. 4-5 pound weight loss in the past 1-2 weeks. Admitted with Sepsis secondary to UTI, acute dehydration.  Dawn [name] is familiar to our service, calorie count was done 12/24 - was found to be meeting 65% of her estimated needs and 58% of protein needs at that time. Also noted hx of anorexia and early satiety per previous RD note. Spoke with Patient's Son at bedside. He reports patient has been eating poorly since October. Normally eats "good" 1 meal out of 5. Was diagnosed with thrush upon this admission. Has been in a facility since December 28th where she was receiving 3 meals a day but this trend continued. There she aspirated on a green bean and was switched to NDD3 with nectar thickened liquids. Was 95 pounds on the 28th, he believes she has lost about about 5 pounds, seems it was related to dehydration. Did not eat much of her breakfast this morning, only had a few bites of eggs. Son does state she will drink milks. Also drank 2 containers of nectar thickened water during visit. Will try  hormel shake and magic cup, cannot consume ensure on nectar thickened diet.   NUTRITION - FOCUSED PHYSICAL EXAM:    Most Recent Value  Orbital Region  Severe depletion  Upper Arm Region  Severe depletion  Thoracic and Lumbar Region  Severe depletion  Buccal Region  Severe depletion  Temple Region  Severe depletion  Clavicle Bone Region  Severe depletion  Clavicle and Acromion Bone Region  Severe depletion  Scapular Bone Region  Severe depletion  Dorsal Hand  Severe depletion  Patellar Region  Severe depletion  Anterior Thigh Region  Severe depletion  Posterior Calf Region  Severe depletion  Edema (RD Assessment)  None  Hair  Reviewed  Eyes  Reviewed  Mouth  Reviewed [noted thrush upon admit]  Skin  Reviewed [bruises on BL LE]  Nails  Reviewed       Diet Order:  Aspiration precautions Fall precautions DIET DYS 3 Room service appropriate? Yes; Fluid consistency: Nectar Thick  EDUCATION NEEDS:   Education needs have been addressed  Skin:  Skin Assessment: Skin Integrity Issues: Skin Integrity Issues:: Stage II Stage II: to Coccyx  Last BM:  PTA  Height:   Ht Readings from Last 1 Encounters:  01/03/18 5\' 3"  (1.6 m)    Weight:   Wt Readings from Last 1 Encounters:  01/04/18 94 lb 14.4 oz (43 kg)    Ideal Body Weight:  52.27 kg  BMI:  Body mass index is 16.81 kg/m.  Estimated Nutritional Needs:   Kcal:  1300-1500 calories  Protein:  64-73  grams (1.5-1.7g/kg)  Fluid:  >1.5L   Dawn Anis. Dawn Panameno, MS, RD LDN Inpatient Clinical Dietitian Pager [phone]

## 2018-01-04 NOTE — Progress Notes (Signed)
Family Meeting Note  Advance Directive:yes  Today a meeting took place with the Patient.and son  The following clinical team members were present during this meeting:MD  The following were discussed:Patient's diagnosis: , Sepsis due to UTI Norovirus diarrhea Hypokalemia Anemia Patient's progosis: Unable to determine and Goals for treatment: Full Code  Additional follow-up to be provided: they would like chaplain visit D/w nursing who will call chaplain  Time spent during discussion: 16 minutes  Ezreal Turay, MD

## 2018-01-04 NOTE — Progress Notes (Signed)
Pharmacy Antibiotic Note  Dawn [name] is a 84 y.o. female admitted on 01/03/2018 with UTI.  Pharmacy has been consulted for ceftriaxone dosing.  Plan: ceftriaxone 1gm iv q24h   Height: 5\' 3"  (160 cm) Weight: 94 lb 14.4 oz (43 kg) IBW/kg (Calculated) : 52.4  Temp (24hrs), Avg:98 F (36.7 C), Min:97.8 F (36.6 C), Max:98.4 F (36.9 C)  Recent Labs  Lab 01/03/18 1141 01/03/18 1434 01/03/18 1549 01/03/18 1842 01/03/18 2129 01/04/18 0352  WBC 15.6*  --  17.8*  --   --  16.6*  CREATININE 1.22*  --  0.96  --   --  0.86  LATICACIDVEN 3.6* 2.4* 2.3* 1.9 2.0*  --     Estimated Creatinine Clearance: 31.3 mL/min (by C-G formula based on SCr of 0.86 mg/dL).    Allergies  Allergen Reactions  . Percocet [Oxycodone-Acetaminophen] Itching  . Percodan [Oxycodone-Aspirin] Itching    Antimicrobials this admission: Anti-infectives (From admission, onward)   Start     Dose/Rate Route Frequency Ordered Stop   01/04/18 1400  ceFEPIme (MAXIPIME) 2 g in dextrose 5 % 50 mL IVPB  Status:  Discontinued     2 g 100 mL/hr over 30 Minutes Intravenous Every 24 hours 01/03/18 1527 01/04/18 1040   01/04/18 1230  cefTRIAXone (ROCEPHIN) 1 g in dextrose 5 % 50 mL IVPB     1 g 100 mL/hr over 30 Minutes Intravenous Every 24 hours 01/04/18 1045     01/03/18 1530  ceFEPIme (MAXIPIME) 2 g in dextrose 5 % 50 mL IVPB  Status:  Discontinued     2 g 100 mL/hr over 30 Minutes Intravenous  Once 01/03/18 1521 01/03/18 1526   01/03/18 1530  ceFEPIme (MAXIPIME) 1 g in dextrose 5 % 50 mL IVPB     1 g 100 mL/hr over 30 Minutes Intravenous  Once 01/03/18 1526 01/03/18 1810   01/03/18 1400  ceFEPIme (MAXIPIME) 1 g in dextrose 5 % 50 mL IVPB     1 g 100 mL/hr over 30 Minutes Intravenous  Once 01/03/18 1346 01/03/18 1430   01/03/18 1230  cefTRIAXone (ROCEPHIN) 1 g in dextrose 5 % 50 mL IVPB     1 g 100 mL/hr over 30 Minutes Intravenous  Once 01/03/18 1221 01/03/18 1332       Microbiology results: Recent  Results (from the past 240 hour(s))  Blood Culture (routine x 2)     Status: None (Preliminary result)   Collection Time: 01/03/18  1:00 PM  Result Value Ref Range Status   Specimen Description BLOOD RFOA  Final   Special Requests   Final    BOTTLES DRAWN AEROBIC AND ANAEROBIC Blood Culture adequate volume   Culture   Final    NO GROWTH < 24 HOURS Performed at [facility], [address] [place], [place] [address]    Report Status PENDING  Incomplete  Blood Culture (routine x 2)     Status: None (Preliminary result)   Collection Time: 01/03/18  1:00 PM  Result Value Ref Range Status   Specimen Description BLOOD RFOA  Final   Special Requests   Final    BOTTLES DRAWN AEROBIC AND ANAEROBIC Blood Culture adequate volume   Culture   Final    NO GROWTH < 24 HOURS Performed at [facility], [address] [place], [place] [address]    Report Status PENDING  Incomplete  Gastrointestinal Panel by PCR , Stool     Status: Abnormal   Collection Time:  01/03/18  3:27 PM  Result Value Ref Range Status   Campylobacter species NOT DETECTED NOT DETECTED Final   Plesimonas shigelloides NOT DETECTED NOT DETECTED Final   Salmonella species NOT DETECTED NOT DETECTED Final   Yersinia enterocolitica NOT DETECTED NOT DETECTED Final   Vibrio species NOT DETECTED NOT DETECTED Final   Vibrio cholerae NOT DETECTED NOT DETECTED Final   Enteroaggregative E coli (EAEC) NOT DETECTED NOT DETECTED Final   Enteropathogenic E coli (EPEC) NOT DETECTED NOT DETECTED Final   Enterotoxigenic E coli (ETEC) NOT DETECTED NOT DETECTED Final   Shiga like toxin producing E coli (STEC) NOT DETECTED NOT DETECTED Final   Shigella/Enteroinvasive E coli (EIEC) NOT DETECTED NOT DETECTED Final   Cryptosporidium NOT DETECTED NOT DETECTED Final   Cyclospora cayetanensis NOT DETECTED NOT DETECTED Final   Entamoeba histolytica NOT DETECTED NOT DETECTED Final   Giardia lamblia NOT DETECTED NOT DETECTED Final    Adenovirus F40/41 NOT DETECTED NOT DETECTED Final   Astrovirus NOT DETECTED NOT DETECTED Final   Norovirus GI/GII DETECTED (A) NOT DETECTED Final    Comment: RESULT CALLED TO, READ BACK BY AND VERIFIED WITH: JANCY JOHNSTONE AT 1828 ON 01/03/2018 JJB    Rotavirus A NOT DETECTED NOT DETECTED Final   Sapovirus (I, II, IV, and V) NOT DETECTED NOT DETECTED Final    Comment: Performed at [facility], [address] [place], [place] [address]  MRSA PCR Screening     Status: None   Collection Time: 01/03/18  6:39 PM  Result Value Ref Range Status   MRSA by PCR NEGATIVE NEGATIVE Final    Comment:        The GeneXpert MRSA Assay (FDA approved for NASAL specimens only), is one component of a comprehensive MRSA colonization surveillance program. It is not intended to diagnose MRSA infection nor to guide or monitor treatment for MRSA infections. Performed at [facility], [address], [place], [place] [address]     Thank you for allowing pharmacy to be a part of this patient's care.  Donna Christen Kashif Pooler 01/04/2018 10:46 AM

## 2018-01-04 NOTE — Clinical Social Work Note (Signed)
CSW was informed that patient is from [facility] as a short term rehab patient.  Physician asked CSW to inform SNF that patient has Norovirus, CSW contacted SNF to make them aware.  CSW to continue to follow patient's progress throughout discharge planning.  Jones Broom. [place], MSW, [place]  01/04/2018 10:20 AM

## 2018-01-05 LAB — URINE CULTURE

## 2018-01-05 LAB — BASIC METABOLIC PANEL
Anion gap: 7 (ref 5–15)
BUN: 43 mg/dL — ABNORMAL HIGH (ref 6–20)
CALCIUM: 7.4 mg/dL — AB (ref 8.9–10.3)
CO2: 19 mmol/L — AB (ref 22–32)
CREATININE: 0.98 mg/dL (ref 0.44–1.00)
Chloride: 116 mmol/L — ABNORMAL HIGH (ref 101–111)
GFR calc non Af Amer: 50 mL/min — ABNORMAL LOW (ref >60)
GFR, EST AFRICAN AMERICAN: 58 mL/min — AB (ref >60)
Glucose, Bld: 86 mg/dL (ref 65–99)
Potassium: 3.1 mmol/L — ABNORMAL LOW (ref 3.5–5.1)
SODIUM: 142 mmol/L (ref 135–145)

## 2018-01-05 LAB — CBC
HCT: 23.3 % — ABNORMAL LOW (ref 35.0–47.0)
Hemoglobin: 7.6 g/dL — ABNORMAL LOW (ref 12.0–16.0)
MCH: 27 pg (ref 26.0–34.0)
MCHC: 32.5 g/dL (ref 32.0–36.0)
MCV: 83 fL (ref 80.0–100.0)
Platelets: 240 10*3/uL (ref 150–440)
RBC: 2.8 MIL/uL — AB (ref 3.80–5.20)
RDW: 15.5 % — ABNORMAL HIGH (ref 11.5–14.5)
WBC: 13.3 10*3/uL — AB (ref 3.6–11.0)

## 2018-01-05 LAB — HEMOGLOBIN: HEMOGLOBIN: 10 g/dL — AB (ref 12.0–16.0)

## 2018-01-05 MED ORDER — POTASSIUM CHLORIDE CRYS ER 20 MEQ PO TBCR
40.0000 meq | EXTENDED_RELEASE_TABLET | Freq: Once | ORAL | Status: AC
  Administered 2018-01-05: 40 meq via ORAL
  Filled 2018-01-05: qty 2

## 2018-01-05 MED ORDER — NYSTATIN 100000 UNIT/ML MT SUSP
5.0000 mL | Freq: Four times a day (QID) | OROMUCOSAL | Status: AC
  Administered 2018-01-05 – 2018-01-06 (×4): 500000 [IU] via ORAL
  Filled 2018-01-05 (×4): qty 5

## 2018-01-05 NOTE — NC FL2 (Signed)
[place] LEVEL OF CARE SCREENING TOOL     IDENTIFICATION  Patient Name: Dawn [name] Birthdate: 06/15/1928 Sex: female Admission Date (Current Location): 01/03/2018  [place] and [place] Number:  Engineering geologist and Address:  [facility], [address] [place], [place] [address]      Provider Number: [id number]  Attending Physician Name and Address:  Henreitta Leber, MD  Relative Name and Phone Number:  Angeletta Goelz    Current Level of Care: Hospital Recommended Level of Care: [place] Prior Approval Number:    Date Approved/Denied:   PASRR Number: [id number]  Discharge Plan: SNF    Current Diagnoses: Patient Active Problem List   Diagnosis Date Noted  . Pressure injury of skin 01/04/2018  . Sepsis ([place]) 01/03/2018  . Protein-calorie malnutrition, severe 11/14/2017  . Atherosclerosis of native arteries of extremity with rest pain ([place]) 11/09/2017  . Ischemic leg 11/09/2017  . Atherosclerotic peripheral vascular disease with rest pain ([place]) 11/09/2017  . Atypical chest pain 09/21/2017  . Renal artery stenosis ([place]) 06/15/2017  . Hyperlipidemia 05/09/2017  . Essential hypertension 10/17/2016  . Carotid stenosis 10/17/2016  . PVD (peripheral vascular disease) ([place]) 10/17/2016  . B12 deficiency 07/19/2016  . Vitamin D deficiency, unspecified 07/19/2016  . History of right hip replacement 11/04/2015  . Aftercare following bilateral hip joint replacement surgery 11/05/2014  . SCC (squamous cell carcinoma) 05/21/2014  . 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 S/P hip replacement 10/03/2012    Orientation RESPIRATION BLADDER Height & Weight     Self, Place  O2(2L o2) Incontinent Weight: 99 lb (44.9 kg) Height:  5\' 3"  (160 cm)  BEHAVIORAL SYMPTOMS/MOOD NEUROLOGICAL BOWEL NUTRITION STATUS      Incontinent Diet(Dysphagia 3, Nectar thick liquids)  AMBULATORY STATUS COMMUNICATION OF NEEDS Skin    Extensive Assist Verbally Normal                       Personal Care Assistance Level of Assistance  Bathing, Feeding, Dressing Bathing Assistance: Limited assistance Feeding assistance: Independent Dressing Assistance: Limited assistance     Functional Limitations Info  Sight, Hearing Sight Info: Impaired Hearing Info: Impaired      SPECIAL CARE FACTORS FREQUENCY  PT (By licensed PT)     PT Frequency: Up to 5/week              Contractures Contractures Info: Not present    Additional Factors Info  Code Status, Allergies, Isolation Precautions Code Status Info: Full Allergies Info: Percocet Oxycodone-acetaminophen, Percodan Oxycodone-aspirin     Isolation Precautions Info: Enteric Precautions;      Current Medications (01/05/2018):  This is the current hospital active medication list Current Facility-Administered Medications  Medication Dose Route Frequency Provider Last Rate Last Dose  . 0.9 %  sodium chloride infusion   Intravenous Continuous Bettey Costa, MD 50 mL/hr at 01/05/18 0547    . acetaminophen (TYLENOL) tablet 650 mg  650 mg Oral Q6H PRN Salary, Montell D, MD       Or  . acetaminophen (TYLENOL) suppository 650 mg  650 mg Rectal Q6H PRN Salary, Montell D, MD      . acidophilus (RISAQUAD) capsule 1 capsule  1 capsule Oral Daily Salary, Montell D, MD   1 capsule at 01/05/18 1110  . amLODipine (NORVASC) tablet 10 mg  10 mg Oral Daily Salary, Montell D, MD   10 mg at 01/05/18 1113  . atorvastatin (LIPITOR) tablet  20 mg  20 mg Oral q1800 Salary, Montell D, MD   20 mg at 01/04/18 1749  . budesonide (PULMICORT) nebulizer solution 0.5 mg  0.5 mg Nebulization BID Salary, Montell D, MD   0.5 mg at 01/05/18 0848  . cefTRIAXone (ROCEPHIN) 1 g in dextrose 5 % 50 mL IVPB  1 g Intravenous Q24H Coffee, Garrett, RPH 100 mL/hr at 01/05/18 1308 1 g at 01/05/18 1308  . chlorhexidine (PERIDEX) 0.12 % solution 15 mL  15 mL Mouth Rinse BID Salary, Montell D, MD   15 mL at  01/05/18 1113  . cholecalciferol (VITAMIN D) tablet 400 Units  400 Units Oral Daily Salary, Holly Bodily D, MD   400 Units at 01/04/18 1008  . cloNIDine (CATAPRES) tablet 0.1 mg  0.1 mg Oral TID Loney Hering D, MD   0.1 mg at 01/05/18 1113  . guaifenesin (ROBITUSSIN) 100 MG/5ML syrup 400 mg  400 mg Oral TID PRN Salary, Montell D, MD      . indomethacin (INDOCIN) capsule 50 mg  50 mg Oral BID PRN Salary, Montell D, MD      . ipratropium-albuterol (DUONEB) 0.5-2.5 (3) MG/3ML nebulizer solution 3 mL  3 mL Nebulization Q6H PRN Salary, Montell D, MD      . ketorolac (ACULAR) 0.5 % ophthalmic solution 1 drop  1 drop Left Eye TID Salary, Montell D, MD   1 drop at 01/05/18 1557  . MEDLINE mouth rinse  15 mL Mouth Rinse q12n4p Salary, Montell D, MD   15 mL at 01/05/18 1557  . multivitamin with minerals tablet 1 tablet  1 tablet Oral QHS Salary, Holly Bodily D, MD   1 tablet at 01/04/18 2235  . nystatin (MYCOSTATIN) 100000 UNIT/ML suspension 500,000 Units  5 mL Oral QID Henreitta Leber, MD   500,000 Units at 01/05/18 1556  . ondansetron (ZOFRAN) tablet 4 mg  4 mg Oral Q6H PRN Salary, Montell D, MD       Or  . ondansetron (ZOFRAN) injection 4 mg  4 mg Intravenous Q6H PRN Salary, Montell D, MD      . pantoprazole (PROTONIX) injection 40 mg  40 mg Intravenous Q12H Mody, Sital, MD   40 mg at 01/05/18 1114  . sodium chloride 0.9 % bolus 1,000 mL  1,000 mL Intravenous Once Salary, Montell D, MD       And  . sodium chloride 0.9 % bolus 500 mL  500 mL Intravenous Once Salary, Montell D, MD      . sodium chloride flush (NS) 0.9 % injection 3 mL  3 mL Intravenous Q12H Salary, Montell D, MD   3 mL at 01/05/18 1115  . sodium chloride flush (NS) 0.9 % injection 3 mL  3 mL Intravenous Q12H Salary, Montell D, MD   3 mL at 01/05/18 1115  . traMADol (ULTRAM) tablet 50 mg  50 mg Oral Q6H PRN Salary, Montell D, MD   50 mg at 01/05/18 1515  . vitamin B-12 (CYANOCOBALAMIN) tablet 5,000 mcg  5,000 mcg Oral Daily Salary, Montell D, MD    5,000 mcg at 01/05/18 1112     Discharge Medications: Please see discharge summary for a list of discharge medications.  Relevant Imaging Results:  Relevant Lab Results:   Additional Information SS# [id number]  Zettie Pho, LCSW

## 2018-01-05 NOTE — Progress Notes (Signed)
Patient received I unit of blood  This shift without any reaction. V/S stable. Will continue to monitor.

## 2018-01-05 NOTE — Progress Notes (Signed)
Pharmacy consulted for electrolyte replacement protocol:   Patient w/ norovirus  Goal of therapy: Electrolytes within normal limits:  K 3.5 - 5.1 Corrected Ca 8.9 - 10.3 Phos 2.5 - 4.6 Mg 1.7 - 2.4   Assessment: Lab Results  Component Value Date   CREATININE 0.98 01/05/2018   BUN 43 (H) 01/05/2018   NA 142 01/05/2018   K 3.1 (L) 01/05/2018   CL 116 (H) 01/05/2018   CO2 19 (L) 01/05/2018    Plan: K = 3.1. Will order KCL 40 meq po x 1. F/u labs in am   Chinita Greenland PharmD Clinical Pharmacist 01/05/2018

## 2018-01-05 NOTE — Evaluation (Signed)
Clinical/Bedside Swallow Evaluation Patient Details  Name: Dawn [name] MRN: [id number] Date of Birth: 1928-05-25  Today's Date: 01/05/2018 Time: SLP Start Time (ACUTE ONLY): 1000 SLP Stop Time (ACUTE ONLY): 1100 SLP Time Calculation (min) (ACUTE ONLY): 60 min  Past Medical History:  Past Medical History:  Diagnosis Date  . Cancer ([place])    skin cancer  . 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 Hypertension   . Sciatica   . Sciatica    Past Surgical History:  Past Surgical History:  Procedure Laterality Date  . ABDOMINAL HYSTERECTOMY    . APPENDECTOMY    . CATARACT EXTRACTION, BILATERAL    . CHOLECYSTECTOMY    . ENDARTERECTOMY FEMORAL Right 04/19/2017   Procedure: ENDARTERECTOMY COMMON FEMORAL ARTERY, PROFUNDA  FEMORIS ARTERY, and  SUPERFICIAL FEMORAL ARTERY;  Surgeon: Algernon Huxley, MD;  Location: [facility] ORS;  Service: Vascular;  Laterality: Right;  . ENDARTERECTOMY FEMORAL Left 11/14/2017   Procedure: ENDARTERECTOMY FEMORAL;  Surgeon: Algernon Huxley, MD;  Location: [facility] ORS;  Service: Vascular;  Laterality: Left;  . HIP SURGERY Bilateral   . LOWER EXTREMITY ANGIOGRAM Right 04/19/2017   Procedure: LOWER EXTREMITY ANGIOGRAM WITH SUPERFICIAL FEMORAL ARTERY STENT PLACEMENT;  Surgeon: Algernon Huxley, MD;  Location: [facility] ORS;  Service: Vascular;  Laterality: Right;  . LOWER EXTREMITY ANGIOGRAPHY Right 04/18/2017   Procedure: Lower Extremity Angiography;  Surgeon: Algernon Huxley, MD;  Location: [place] [facility];  Service: Cardiovascular;  Laterality: Right;  . LOWER EXTREMITY ANGIOGRAPHY Left 07/12/2017   Procedure: Lower Extremity Angiography;  Surgeon: Algernon Huxley, MD;  Location: [place] [facility];  Service: Cardiovascular;  Laterality: Left;  . LOWER EXTREMITY ANGIOGRAPHY Left 11/09/2017   Procedure: LOWER EXTREMITY ANGIOGRAPHY;  Surgeon: Algernon Huxley, MD;  Location: [place] [facility];  Service: Cardiovascular;  Laterality:  Left;  . LOWER EXTREMITY INTERVENTION  07/12/2017   Procedure: Lower Extremity Intervention;  Surgeon: Algernon Huxley, MD;  Location: [place] [facility];  Service: Cardiovascular;;  . LOWER EXTREMITY INTERVENTION  11/09/2017   Procedure: LOWER EXTREMITY INTERVENTION;  Surgeon: Algernon Huxley, MD;  Location: [place] [facility];  Service: Cardiovascular;;  . RENAL ANGIOGRAPHY N/A 06/21/2017   Procedure: Renal Angiography;  Surgeon: Algernon Huxley, MD;  Location: [place] [facility];  Service: Cardiovascular;  Laterality: N/A;  . SHOULDER SURGERY Bilateral   . TOTAL HIP ARTHROPLASTY Bilateral    HPI:  Dawn [name]  is a 84 y.o. female with a known history per below, recently sent to inpatient rehab status post vascular procedure to her lower extremities for peripheral vascular disease, presents with 1-2-day history of worsening nausea, vomiting, diarrhea, generalized weakness, fatigue, altered mental status, most of the information is coming from the patient's son whom has numerous complaints, which also includes chronic cough-no etiology, chest x-ray done on Monday which was negative, concern for oral thrush-has not been treated per's patient's son, 4-5 pound weight loss within the last week or 2,, poor p.o. intake, disputes any history of malnutrition-though records indicate severe protein energy malnutrition/failure to thrive-the patient's son is argumentative when addressing this issue as well as advanced directives, daughter-in-law is also present at the bedside, noted hypoxia-satting 88% on room air, improved with nasal cannula oxygen, ER workup noted for sepsis due to UTI, potassium 3.4, creatinine 1.2, BUN 60, white count 15.6, hemoglobin 9.2, lactic acid 3.6, AST 55, patient is now being admitted for  acute sepsis secondary to acute urinary tract infection, acute dehydration, acute lactic acidosis, and diarrhea. Pt had a BSE when she was last admitted in December and was recommended to have Dysphagia  3 with thin liquids. Since discharge and admission to SNF, family reports pt has been on Mech soft w/pureed meats and nectar thick liquids.    Assessment / Plan / Recommendation Clinical Impression  This 84 y/o female presents w/oral dysphagia and suspected pharyngeal dysphagia. Pt has been NPO except for sips of thickened (nectar) thick liquids and ice chips since admission. Pt has been on a Dys 3 diet w/pureed meats with nectar thick liquids at SNF prior to this admission. Family reports they have not observed any overt s/s of aspiration when she is eating and drinking. Pt given trials of ice chips, nectar thick liquids, puree and solid consistencies. Did not attempt thin liquids d/t pt's h/o dysphagia and d/t generalized weakness on this admission. No overt s/s of aspiration were observed immediately following any trials given. Pt did have intermittent cough during trials, however the cough did not appear related to any of the PO given to pt. Pt was also observed to cough when no trials were being given. Pt's vocal quality remained clear throughout trials and further no other s/s of aspiration such as tearing, runny nose or throat clearing was observed. Pt did present w/impaired mastication and clearing of solid consistency. Pt left with mild oral residue following mastication and swallow of solid. Pt was able to clear with a f/u tsp of puree consistency or thickened liquid. Pt was also noted to have intermittent confusion when presented with a tsp or cup sip of thickened liquids and would need verbal cues to accept bite. This confusion imroved over the course of the trials and was not observed throughout. Pt is judged to be a mild-moderate risk of aspiration d/t generalized weakness and overall health status. Aspiration precautions and altered diet reduce this risk. Recommend a Dysphagia 3 diet and pureed meats w/nectar thick liquids and aspiration precautions. Pt may have ice chips for pleasure. Will follow  up in 1-2 days regarding toleration of diet. Spoke w/MD, nsg and family regarding recommendations. MD to initiate diet as appropriate.  SLP Visit Diagnosis: Dysphagia, oropharyngeal phase (R13.12)    Aspiration Risk  Mild aspiration risk;Moderate aspiration risk    Diet Recommendation Dysphagia 3 (Mech soft);Nectar-thick liquid;Other (Comment)(Pureed meats and ice chips for pleasure)   Liquid Administration via: Cup;Spoon;No straw Medication Administration: Crushed with puree Supervision: Full supervision/cueing for compensatory strategies;Staff to assist with self feeding Compensations: Minimize environmental distractions;Slow rate;Small sips/bites Postural Changes: Seated upright at 90 degrees;Remain upright for at least 30 minutes after po intake    Other  Recommendations Oral Care Recommendations: Oral care QID;Staff/trained caregiver to provide oral care   Follow up Recommendations Skilled Nursing facility      Frequency and Duration min 3x week  2 weeks       Prognosis Prognosis for Safe Diet Advancement: Fair Barriers to Reach Goals: Cognitive deficits      Swallow Study   General Date of Onset: 01/04/18 HPI: Dawn [name]  is a 84 y.o. female with a known history per below, recently sent to inpatient rehab status post vascular procedure to her lower extremities for peripheral vascular disease, presents with 1-2-day history of worsening nausea, vomiting, diarrhea, generalized weakness, fatigue, altered mental status, most of the information is coming from the patient's son whom has numerous complaints, which also includes chronic cough-no etiology,  chest x-ray done on Monday which was negative, concern for oral thrush-has not been treated per's patient's son, 4-5 pound weight loss within the last week or 2,, poor p.o. intake, disputes any history of malnutrition-though records indicate severe protein energy malnutrition/failure to thrive-the patient's son is argumentative when  addressing this issue as well as advanced directives, daughter-in-law is also present at the bedside, noted hypoxia-satting 88% on room air, improved with nasal cannula oxygen, ER workup noted for sepsis due to UTI, potassium 3.4, creatinine 1.2, BUN 60, white count 15.6, hemoglobin 9.2, lactic acid 3.6, AST 55, patient is now being admitted for acute sepsis secondary to acute urinary tract infection, acute dehydration, acute lactic acidosis, and diarrhea. Pt had a BSE when she was last admitted in December and was recommended to have Dysphagia 3 with thin liquids. Since discharge and admission to SNF, family reports pt has been on Mech soft w/pureed meats and nectar thick liquids.  Type of Study: Bedside Swallow Evaluation Previous Swallow Assessment: Previous assessment in December 2018, pt was recommend to have Dys 3 diet w/thin liquids, but has since been downgraded to nectar thick liquids after admission to SNF.  Diet Prior to this Study: NPO;Other (Comment)(ice chips and thickened liquids) Temperature Spikes Noted: No Respiratory Status: Nasal cannula History of Recent Intubation: No Behavior/Cognition: Alert;Cooperative;Pleasant mood;Requires cueing Oral Cavity Assessment: Dry;Other (comment)(Pt reports her mouth is itchy. Family is c/o thrush. Nsg and MD aware. ) Oral Care Completed by SLP: Other (Comment)(Family had completed and also swabbed mouth in presence of clinician) Oral Cavity - Dentition: Dentures, top;Other (Comment)(Partial on bottom) Vision: (Unable to assess. ST or family fed pt.) Self-Feeding Abilities: Total assist Patient Positioning: Upright in bed Baseline Vocal Quality: Normal Volitional Cough: Strong Volitional Swallow: Able to elicit    Oral/Motor/Sensory Function Overall Oral Motor/Sensory Function: Generalized oral weakness Facial ROM: Within Functional Limits Facial Symmetry: Within Functional Limits Facial Strength: (generalized weakness) Facial Sensation:  Within Functional Limits Lingual ROM: Within Functional Limits Lingual Symmetry: Within Functional Limits Lingual Strength: Reduced Lingual Sensation: Within Functional Limits Velum: Within Functional Limits Mandible: Within Functional Limits   Ice Chips Ice chips: Within functional limits Presentation: Spoon   Thin Liquid Thin Liquid: Not tested    Nectar Thick Nectar Thick Liquid: Within functional limits Presentation: Cup;Spoon   Honey Thick Honey Thick Liquid: Not tested   Puree Puree: Within functional limits Presentation: [place], [place], CCC-SLP  Speech-Language Pathologist  Solid: Impaired Oral Phase Impairments: Impaired mastication Oral Phase Functional Implications: Impaired mastication;Oral residue        Walden,Maaran 01/05/2018,11:16 AM

## 2018-01-05 NOTE — Progress Notes (Signed)
[facility] Gastroenterology Inpatient Progress Note  Subjective: Patient seen for follow up of melena, hematochezia, anemia. Patient appears more comfortable today. Had two black small liquid stools this AM. Has less abdominal discomfort.   Objective: Vital signs in last 24 hours: Temp:  [97.2 F (36.2 C)-98.2 F (36.8 C)] 98.2 F (36.8 C) (02/09 1606) Pulse Rate:  [70-91] 82 (02/09 1606) Resp:  [16-21] 16 (02/09 1606) BP: (119-146)/(36-58) 144/58 (02/09 1606) SpO2:  [95 %-100 %] 98 % (02/09 1606) Weight:  [44.9 kg (99 lb)] 44.9 kg (99 lb) (02/09 0500) Blood pressure (!) 144/58, pulse 82, temperature 98.2 F (36.8 C), temperature source Oral, resp. rate 16, height 5\' 3"  (1.6 m), weight 44.9 kg (99 lb), SpO2 98 %.    Intake/Output from previous day: 02/08 0701 - 02/09 0700 In: 1438 [I.V.:350; Blood:1088] Out: 0   Intake/Output this shift: Total I/O In: 2204.4 [I.V.:1740; Blood:364.4; IV Piggyback:100] Out: -    General appearance:  Somnolent, though oriented x 3. Resp:  Coarse BS's, no wheezes, normal bilateral excursion Cardio:  RRR GI:  Nontender, soft, no rebound Extremities:  NO edema.   Lab Results: Results for orders placed or performed during the hospital encounter of 01/03/18 (from the past 24 hour(s))  Hemoglobin and hematocrit, blood     Status: Abnormal   Collection Time: 01/04/18  7:22 PM  Result Value Ref Range   Hemoglobin 7.5 (L) 12.0 - 16.0 g/dL   HCT 23.4 (L) 35.0 - 47.0 %  Prepare RBC     Status: None   Collection Time: 01/04/18  9:25 PM  Result Value Ref Range   Order Confirmation      ORDER PROCESSED BY BLOOD BANK Performed at [facility], [address] [place], [place] [address]   Basic metabolic panel     Status: Abnormal   Collection Time: 01/05/18  4:41 AM  Result Value Ref Range   Sodium 142 135 - 145 mmol/L   Potassium 3.1 (L) 3.5 - 5.1 mmol/L   Chloride 116 (H) 101 - 111 mmol/L   CO2 19 (L) 22 - 32 mmol/L   Glucose,  Bld 86 65 - 99 mg/dL   BUN 43 (H) 6 - 20 mg/dL   Creatinine, Ser 0.98 0.44 - 1.00 mg/dL   Calcium 7.4 (L) 8.9 - 10.3 mg/dL   GFR calc non Af Amer 50 (L) >60 mL/min   GFR calc Af Amer 58 (L) >60 mL/min   Anion gap 7 5 - 15  CBC     Status: Abnormal   Collection Time: 01/05/18  4:41 AM  Result Value Ref Range   WBC 13.3 (H) 3.6 - 11.0 K/uL   RBC 2.80 (L) 3.80 - 5.20 MIL/uL   Hemoglobin 7.6 (L) 12.0 - 16.0 g/dL   HCT 23.3 (L) 35.0 - 47.0 %   MCV 83.0 80.0 - 100.0 fL   MCH 27.0 26.0 - 34.0 pg   MCHC 32.5 32.0 - 36.0 g/dL   RDW 15.5 (H) 11.5 - 14.5 %   Platelets 240 150 - 440 K/uL  Hemoglobin     Status: Abnormal   Collection Time: 01/05/18 11:44 AM  Result Value Ref Range   Hemoglobin 10.0 (L) 12.0 - 16.0 g/dL     Recent Labs    01/03/18 1549 01/04/18 0352 01/04/18 1922 01/05/18 0441 01/05/18 1144  WBC 17.8* 16.6*  --  13.3*  --   HGB 7.1* 6.3* 7.5* 7.6* 10.0*  HCT 23.2* 20.0* 23.4* 23.3*  --  PLT 439 383  --  240  --    BMET Recent Labs    01/03/18 1549 01/04/18 0352 01/05/18 0441  NA 144 145 142  K 2.9* 2.9* 3.1*  CL 110 113* 116*  CO2 21* 23 19*  GLUCOSE 138* 107* 86  BUN 46* 39* 43*  CREATININE 0.96 0.86 0.98  CALCIUM 7.6* 7.5* 7.4*   LFT Recent Labs    01/03/18 1549  PROT 6.3*  ALBUMIN 2.2*  AST 54*  ALT 23  ALKPHOS 110  BILITOT 0.2*   PT/INR Recent Labs    01/03/18 1549  LABPROT 14.2  INR 1.11   Hepatitis Panel No results for input(s): HEPBSAG, HCVAB, HEPAIGM, HEPBIGM in the last 72 hours. C-Diff No results for input(s): CDIFFTOX in the last 72 hours. No results for input(s): CDIFFPCR in the last 72 hours.   Studies/Results: Ct Chest Wo Contrast  Result Date: 01/03/2018 CLINICAL DATA:  Worsening oxygenation. Chronic cough and weakness. Negative chest x-ray. EXAM: CT CHEST WITHOUT CONTRAST TECHNIQUE: Multidetector CT imaging of the chest was performed following the standard protocol without IV contrast. COMPARISON:  Chest radiography  same day.  CT chest 11/18/2015. FINDINGS: Cardiovascular: Aortic atherosclerotic calcification. Maximal diameter of the ascending aorta 3.4 cm. Extensive coronary artery calcification. Heart size is normal. No pericardial fluid. Mediastinum/Nodes: No mass or adenopathy seen without contrast. Lungs/Pleura: Background pattern of pulmonary scarring and emphysematous change without dominant blebs or bullae. Infiltrate in the posterior right lower lobe consistent with bronchopneumonia. No dense consolidation or lobar collapse. Patchy areas of scarring throughout both lungs, slightly progressive since the previous exam. No suspicion of neoplastic mass lesion. Upper Abdomen: Negative except for vascular calcification. Musculoskeletal: Chronic spinal degenerative changes. IMPRESSION: Bronchopneumonia affecting the posterior aspect of the right lower lobe. Background emphysema and pulmonary scarring which is progressive over time. Advanced aortic atherosclerosis and coronary artery calcification. Electronically Signed   By: Nelson Chimes M.D.   On: 01/03/2018 19:29    Scheduled Inpatient Medications:   . acidophilus  1 capsule Oral Daily  . amLODipine  10 mg Oral Daily  . atorvastatin  20 mg Oral q1800  . budesonide (PULMICORT) nebulizer solution  0.5 mg Nebulization BID  . chlorhexidine  15 mL Mouth Rinse BID  . cholecalciferol  400 Units Oral Daily  . cloNIDine  0.1 mg Oral TID  . ketorolac  1 drop Left Eye TID  . mouth rinse  15 mL Mouth Rinse q12n4p  . multivitamin with minerals  1 tablet Oral QHS  . nystatin  5 mL Oral QID  . pantoprazole (PROTONIX) IV  40 mg Intravenous Q12H  . sodium chloride flush  3 mL Intravenous Q12H  . sodium chloride flush  3 mL Intravenous Q12H  . vitamin B-12  5,000 mcg Oral Daily    Continuous Inpatient Infusions:   . sodium chloride 50 mL/hr at 01/05/18 0547  . cefTRIAXone (ROCEPHIN)  IV Stopped (01/05/18 1618)  . sodium chloride     And  . sodium chloride       PRN Inpatient Medications:  acetaminophen **OR** acetaminophen, guaifenesin, indomethacin, ipratropium-albuterol, ondansetron **OR** ondansetron (ZOFRAN) IV, traMADol  Miscellaneous:   Assessment:   1. Anemia - acute blood loss - Improved HGB  To 10 after transfusion.  Given prior plavix, daily nsaid use concern for peptic ulcer as source. Differential also includes AVM, etc. plavix has been d/c. .   2. Diarrhea - improving Hx  C. Diff as well given recent hospitalizations. Continue supportive  care.   3. Hypokalemia - replace per primary team.   4. Dysphagia - S/P swallowing study per ST. Approved with thickened diet.  5. UTI - per primary team.  6. Severe Peripheral vascular disease.   Plan:  1. Continue observation on PPI but holding Plavix. 2. EGD when feasible.  3. Following.   Teodoro K. Alice Reichert, M.D. 01/05/2018, 5:29 PM

## 2018-01-05 NOTE — Progress Notes (Signed)
Ch followed up on Pt with prayer and conversation with daughter and Pt. Pt asked if she was dying. Ch said he doesn't know but we will do what we can to keep her alive.    01/05/18 1000  Clinical Encounter Type  Visited With Patient and family together  Visit Type Initial;Spiritual support  Referral From Nurse  Spiritual Encounters  Spiritual Needs Prayer;Emotional

## 2018-01-05 NOTE — Progress Notes (Signed)
[facility] at [place] NAME: Dawn [name]    MR#:  [id number]  DATE OF BIRTH:  05-08-1928  SUBJECTIVE:   Patient still having some diarrhea, stools are more formed today. Patient's family is at bedside and is requesting a air mattress along with some nystatin for her thrush. Patient herself is somewhat lethargic but follows commands.  REVIEW OF SYSTEMS:    Review of Systems  Constitutional: Negative for chills and fever.  HENT: Negative for congestion and tinnitus.   Eyes: Negative for blurred vision and double vision.  Respiratory: Negative for cough, shortness of breath and wheezing.   Cardiovascular: Negative for chest pain, orthopnea and PND.  Gastrointestinal: Positive for diarrhea. Negative for abdominal pain, nausea and vomiting.  Genitourinary: Negative for dysuria and hematuria.  Neurological: Positive for weakness. Negative for dizziness, sensory change and focal weakness.  All other systems reviewed and are negative.   Nutrition: Dysphagia III with nectar thick liquids Tolerating Diet: Yes Tolerating PT: Await Eval.   DRUG ALLERGIES:   Allergies  Allergen Reactions  . Percocet [Oxycodone-Acetaminophen] Itching  . Percodan [Oxycodone-Aspirin] Itching    VITALS:  Blood pressure (!) 146/44, pulse 81, temperature (!) 97.5 F (36.4 C), temperature source Oral, resp. rate 18, height 5\' 3"  (1.6 m), weight 44.9 kg (99 lb), SpO2 95 %.  PHYSICAL EXAMINATION:   Physical Exam  GENERAL:  84 y.o.-year-old thin frail patient lying in bed in no acute distress.  EYES: Pupils equal, round, reactive to light and accommodation. No scleral icterus. Extraocular muscles intact.  HEENT: Head atraumatic, normocephalic. Oropharynx and nasopharynx clear.  NECK:  Supple, no jugular venous distention. No thyroid enlargement, no tenderness.  LUNGS: Normal breath sounds bilaterally, no wheezing, rales, rhonchi. No use of accessory muscles of  respiration.  CARDIOVASCULAR: S1, S2 normal. No murmurs, rubs, or gallops.  ABDOMEN: Soft, nontender, nondistended. Bowel sounds present. No organomegaly or mass.  EXTREMITIES: No cyanosis, clubbing or edema b/l.    NEUROLOGIC: Cranial nerves II through XII are intact. No focal Motor or sensory deficits b/l.  Globally weak. PSYCHIATRIC: The patient is alert and oriented x 3.  SKIN: No obvious rash, lesion, or ulcer.    LABORATORY PANEL:   CBC Recent Labs  Lab 01/05/18 0441 01/05/18 1144  WBC 13.3*  --   HGB 7.6* 10.0*  HCT 23.3*  --   PLT 240  --    ------------------------------------------------------------------------------------------------------------------  Chemistries  Recent Labs  Lab 01/03/18 1549  01/04/18 0937 01/05/18 0441  NA 144   < >  --  142  K 2.9*   < >  --  3.1*  CL 110   < >  --  116*  CO2 21*   < >  --  19*  GLUCOSE 138*   < >  --  86  BUN 46*   < >  --  43*  CREATININE 0.96   < >  --  0.98  CALCIUM 7.6*   < >  --  7.4*  MG  --   --  1.8  --   AST 54*  --   --   --   ALT 23  --   --   --   ALKPHOS 110  --   --   --   BILITOT 0.2*  --   --   --    < > = values in this interval not displayed.   ------------------------------------------------------------------------------------------------------------------  Cardiac Enzymes Recent Labs  Lab 01/03/18 1141  TROPONINI <0.03   ------------------------------------------------------------------------------------------------------------------  RADIOLOGY:  Ct Chest Wo Contrast  Result Date: 01/03/2018 CLINICAL DATA:  Worsening oxygenation. Chronic cough and weakness. Negative chest x-ray. EXAM: CT CHEST WITHOUT CONTRAST TECHNIQUE: Multidetector CT imaging of the chest was performed following the standard protocol without IV contrast. COMPARISON:  Chest radiography same day.  CT chest 11/18/2015. FINDINGS: Cardiovascular: Aortic atherosclerotic calcification. Maximal diameter of the ascending aorta  3.4 cm. Extensive coronary artery calcification. Heart size is normal. No pericardial fluid. Mediastinum/Nodes: No mass or adenopathy seen without contrast. Lungs/Pleura: Background pattern of pulmonary scarring and emphysematous change without dominant blebs or bullae. Infiltrate in the posterior right lower lobe consistent with bronchopneumonia. No dense consolidation or lobar collapse. Patchy areas of scarring throughout both lungs, slightly progressive since the previous exam. No suspicion of neoplastic mass lesion. Upper Abdomen: Negative except for vascular calcification. Musculoskeletal: Chronic spinal degenerative changes. IMPRESSION: Bronchopneumonia affecting the posterior aspect of the right lower lobe. Background emphysema and pulmonary scarring which is progressive over time. Advanced aortic atherosclerosis and coronary artery calcification. Electronically Signed   By: Nelson Chimes M.D.   On: 01/03/2018 19:29     ASSESSMENT AND PLAN:   84 year old female with a history essential hypertension who presents from inpatient rehabilitation due to diarrhea and generalized weakness.  1. Sepsis: Patient presents with tachypnea and tachycardia as well as leukocytosis Sepsis is due to UTI - Urine culture growing Providenicia.  - cont. Rocephin.    2. UTI: Continue Rocephin and Urine culture + for Providencia.   - improving.   3. Diarrhea with Norovirus - diarrhea is improving.  - cont. Probiotics.   Continue supportive management with IV fluids  4. Acute on chronic blood loss anemia: Anemia panel consistent with iron deficiency.  - Status post 2 units of packed blood cells transfusion. Hemoglobin improved and currently stable at 10. -Continue PPI twice a day. Hold aspirin, Plavix. Seen by gastroenterology and no plans for acute intervention and continue supportive care for now. Seen by speech therapy and started on a dysphagia 3 diet with nectar thin liquids.  5. Hypokalemia: Due to  diarrhea, improving with supplementation. Magnesium level was normal.  6. Protein calorie malnutrition, severe: appreciate dietary consult and cont. To encourage Po intake.   7. History of COPD without exacerbation - cont. PRN duonebs, Pulmicort nebs.   8. PVD: Hold Plavix for now due to anemia Continue atorvastatin  9. Essential hypertension: Continue Norvasc, clonidine  Await PT eval. Pt's family does not want her going back to the SNF she came from.   All the records are reviewed and case discussed with Care Management/Social Worker. Management plans discussed with the patient, family and they are in agreement.  CODE STATUS: Full code  DVT Prophylaxis: Ted's & SCD's.   TOTAL TIME TAKING CARE OF THIS PATIENT: 30 minutes.   POSSIBLE D/C IN 2-3 DAYS, DEPENDING ON CLINICAL CONDITION.   Henreitta Leber M.D on 01/05/2018 at 3:46 PM  Between 7am to 6pm - Pager - [phone]  After 6pm go to [email or web address] - Proofreader  [facility]  Office  [phone]  CC: Primary care physician; Idelle Crouch, MD

## 2018-01-05 NOTE — Progress Notes (Signed)
Patient Family in room at bedside this morning changing/cleaning patient after bowel movement, family had discarded of stool in trash can in room. RN educated pt family that stool would need to be assessed by RN for further bleeding, and for them to call for assistance. RN also noted that patient family was pausing infusing unit of blood that was hung on prior shift. Family educated on use of call bell and phone, and to please ask for assistance with any further alarms, or concerns, and asked to please not touch the IV pump. Family persistent about low air loss mattress for patient and diet orders, notified MD for further assistance in this matter. MD rounded with this RN to address family concerns at this time, House supervisor notified and spoke with family.

## 2018-01-05 NOTE — Clinical Social Work Note (Signed)
Clinical Social Work Assessment  Patient Details  Name: Dawn [name] MRN: [id number] Date of Birth: 07-Sep-1928  Date of referral:  01/04/18               Reason for consult:  Facility Placement                Permission sought to share information with:  Facility Sport and exercise psychologist, Family Supports Permission granted to share information::  Yes, Verbal Permission Granted  Name::     Dawn, [name] [phone]   Agency::  SNF admissions  Relationship::     Contact Information:     Housing/Transportation Living arrangements for the past 2 months:  [place], [place] of Information:  Medical Team Patient Interpreter Needed:  None Criminal Activity/Legal Involvement Pertinent to Current Situation/Hospitalization:  No - Comment as needed Significant Relationships:  Adult Children Lives with:  Adult Children Do you feel safe going back to the place where you live?  No Need for family participation in patient care:  Yes (Comment)  Care giving concerns: Patient admitted from SNF; Patient's family wants to change SNF   Social Worker assessment / plan:  CSW met with the patient's son at bedside to discuss discharge planning. The patient's son reported that he would like to pursue SNF at [facility] if possible but is willing to make a change to any other SNF that offers. The CSW explained the insurance barrier that the patient is in co-pay days having been in SNF for 42 days this year. The patient's son indicated that he understands.  The CSW has sent the new referral. The patient will most likely discharge early next week pending progress. The CSW will continue to follow.  Employment status:  Retired Forensic scientist:  [employer] PT Recommendations:  [place] / Referral to community resources:  [place]  Patient/Family's Response to care:  The patient's son thanked the  CSW.  Patient/Family's Understanding of and Emotional Response to Diagnosis, Current Treatment, and Prognosis: The patient's family wants to change SNF but continue to pursue SNF as the discharge plan.  Emotional Assessment Appearance:  Appears stated age Attitude/Demeanor/Rapport:    Affect (typically observed):  Appropriate, Calm, Stable Orientation:  Oriented to Self Alcohol / Substance use:  Not Applicable Psych involvement (Current and /or in the community):  No (Comment)  Discharge Needs  Concerns to be addressed:  Care Coordination Readmission within the last 30 days:  No Current discharge risk:  Lack of support system, Cognitively Impaired Barriers to Discharge:  Continued Medical Work up   [employer], LCSW 01/05/2018, 3:54 PM

## 2018-01-06 LAB — BPAM RBC
BLOOD PRODUCT EXPIRATION DATE: 201902162359
Blood Product Expiration Date: 201902152359
Blood Product Expiration Date: 201902162359
ISSUE DATE / TIME: 201902081427
ISSUE DATE / TIME: 201902082207
ISSUE DATE / TIME: 201902090513
UNIT TYPE AND RH: 6200
UNIT TYPE AND RH: 6200
Unit Type and Rh: 6200

## 2018-01-06 LAB — BASIC METABOLIC PANEL
Anion gap: 8 (ref 5–15)
BUN: 34 mg/dL — ABNORMAL HIGH (ref 6–20)
CALCIUM: 7.9 mg/dL — AB (ref 8.9–10.3)
CO2: 20 mmol/L — ABNORMAL LOW (ref 22–32)
CREATININE: 0.94 mg/dL (ref 0.44–1.00)
Chloride: 116 mmol/L — ABNORMAL HIGH (ref 101–111)
GFR calc non Af Amer: 53 mL/min — ABNORMAL LOW (ref >60)
Glucose, Bld: 88 mg/dL (ref 65–99)
Potassium: 3.7 mmol/L (ref 3.5–5.1)
SODIUM: 144 mmol/L (ref 135–145)

## 2018-01-06 LAB — TYPE AND SCREEN
ABO/RH(D): A POS
Antibody Screen: NEGATIVE
UNIT DIVISION: 0
Unit division: 0
Unit division: 0

## 2018-01-06 LAB — CBC
HCT: 31 % — ABNORMAL LOW (ref 35.0–47.0)
Hemoglobin: 10.2 g/dL — ABNORMAL LOW (ref 12.0–16.0)
MCH: 28 pg (ref 26.0–34.0)
MCHC: 32.9 g/dL (ref 32.0–36.0)
MCV: 84.9 fL (ref 80.0–100.0)
PLATELETS: 256 10*3/uL (ref 150–440)
RBC: 3.65 MIL/uL — AB (ref 3.80–5.20)
RDW: 16.5 % — ABNORMAL HIGH (ref 11.5–14.5)
WBC: 12.3 10*3/uL — AB (ref 3.6–11.0)

## 2018-01-06 NOTE — Progress Notes (Signed)
[facility] at [place] NAME: Dawn [name]    MR#:  [id number]  DATE OF BIRTH:  Jun 28, 1928  SUBJECTIVE:   Diarrhea has improved. Still significantly weak. Patient started on a dysphagia 3 diet with nectar thick liquids and is tolerating it well. Patient's son is at bedside and her appetite is improving. Hg. Stable.   REVIEW OF SYSTEMS:    Review of Systems  Constitutional: Negative for chills and fever.  HENT: Negative for congestion and tinnitus.   Eyes: Negative for blurred vision and double vision.  Respiratory: Negative for cough, shortness of breath and wheezing.   Cardiovascular: Negative for chest pain, orthopnea and PND.  Gastrointestinal: Positive for diarrhea. Negative for abdominal pain, nausea and vomiting.  Genitourinary: Negative for dysuria and hematuria.  Neurological: Positive for weakness. Negative for dizziness, sensory change and focal weakness.  All other systems reviewed and are negative.   Nutrition: Dysphagia III with nectar thick liquids Tolerating Diet: Yes Tolerating PT: Await Eval.   DRUG ALLERGIES:   Allergies  Allergen Reactions  . Percocet [Oxycodone-Acetaminophen] Itching  . Percodan [Oxycodone-Aspirin] Itching    VITALS:  Blood pressure (!) 142/49, pulse 86, temperature 98.1 F (36.7 C), temperature source Oral, resp. rate 16, height 5\' 3"  (1.6 m), weight 44.9 kg (99 lb), SpO2 100 %.  PHYSICAL EXAMINATION:   Physical Exam  GENERAL:  84 y.o.-year-old thin frail patient lying in bed in no acute distress.  EYES: Pupils equal, round, reactive to light and accommodation. No scleral icterus. Extraocular muscles intact.  HEENT: Head atraumatic, normocephalic. Oropharynx and nasopharynx clear.  NECK:  Supple, no jugular venous distention. No thyroid enlargement, no tenderness.  LUNGS: Poor Resp. effort, no wheezing, rales, rhonchi. No use of accessory muscles of respiration.  CARDIOVASCULAR: S1, S2  normal. No murmurs, rubs, or gallops.  ABDOMEN: Soft, nontender, nondistended. Bowel sounds present. No organomegaly or mass.  EXTREMITIES: No cyanosis, clubbing or edema b/l.    NEUROLOGIC: Cranial nerves II through XII are intact. No focal Motor or sensory deficits b/l.  Globally weak. PSYCHIATRIC: The patient is alert and oriented x 2.  SKIN: No obvious rash, lesion, or ulcer.    LABORATORY PANEL:   CBC Recent Labs  Lab 01/06/18 0558  WBC 12.3*  HGB 10.2*  HCT 31.0*  PLT 256   ------------------------------------------------------------------------------------------------------------------  Chemistries  Recent Labs  Lab 01/03/18 1549  01/04/18 0937  01/06/18 0558  NA 144   < >  --    < > 144  K 2.9*   < >  --    < > 3.7  CL 110   < >  --    < > 116*  CO2 21*   < >  --    < > 20*  GLUCOSE 138*   < >  --    < > 88  BUN 46*   < >  --    < > 34*  CREATININE 0.96   < >  --    < > 0.94  CALCIUM 7.6*   < >  --    < > 7.9*  MG  --   --  1.8  --   --   AST 54*  --   --   --   --   ALT 23  --   --   --   --   ALKPHOS 110  --   --   --   --  BILITOT 0.2*  --   --   --   --    < > = values in this interval not displayed.   ------------------------------------------------------------------------------------------------------------------  Cardiac Enzymes Recent Labs  Lab 01/03/18 1141  TROPONINI <0.03   ------------------------------------------------------------------------------------------------------------------  RADIOLOGY:  No results found.   ASSESSMENT AND PLAN:   84 year old female with a history essential hypertension who presents from inpatient rehabilitation due to diarrhea and generalized weakness.  1. Sepsis: Patient presents with tachypnea and tachycardia as well as leukocytosis Sepsis is due to UTI - Urine culture growing Providenicia.  - cont. Rocephin.    2. UTI: Continue Rocephin and Urine culture + for Providencia.   - improving.   3.  Diarrhea with Norovirus - diarrhea is improving.  - cont. Probiotics.  Cont. Imodium prn. - started on Dysphagia III diet with nectar thick liquids and tolerating it.   4. Acute on chronic blood loss anemia: Anemia panel consistent with iron deficiency.  - Status post 2 units of packed blood cells transfusion. Hemoglobin improved and currently stable at 10.2. -Continue PPI twice a day. Hold aspirin, Plavix. Seen by gastroenterology and no plans for acute intervention and continue supportive care for now. Seen by speech therapy and started on a dysphagia 3 diet with nectar thin liquids and tolerating it.   5. Hypokalemia: Due to diarrhea, improved with supplementation. Magnesium level was normal.  6. Protein calorie malnutrition, severe: started on Dysphagia III, nectar thick liquids.  - encourage PO intake. Improving.    7. History of COPD without exacerbation - cont. PRN duonebs, Pulmicort nebs.   8. PVD: Hold Plavix for now due to anemia - Continue atorvastatin  9. Essential hypertension: Continue Norvasc, clonidine  Await PT eval. Pt's family does not want her going back to the SNF she came from.   All the records are reviewed and case discussed with Care Management/Social Worker. Management plans discussed with the patient, family and they are in agreement.  CODE STATUS: Full code  DVT Prophylaxis: Ted's & SCD's.   TOTAL TIME TAKING CARE OF THIS PATIENT: 30 minutes.   POSSIBLE D/C IN 2-3 DAYS, DEPENDING ON CLINICAL CONDITION.   Henreitta Leber M.D on 01/06/2018 at 2:31 PM  Between 7am to 6pm - Pager - [phone]  After 6pm go to [email or web address] - Proofreader  [facility]  Office  [phone]  CC: Primary care physician; Idelle Crouch, MD

## 2018-01-06 NOTE — Progress Notes (Signed)
[facility]ology Inpatient Progress Note  Subjective: Patient seen for follow-up of gastrointestinal bleeding. Discussed with patient RN and technician who reported no rectal bleeding for over 24 hours. Patient has not had undergone a undergarment change yet today. Son, Legrand Como, get a bedside. He reports patient's eating is improved with a dysphagia diet.  Objective: Vital signs in last 24 hours: Temp:  [97.6 F (36.4 C)-98.4 F (36.9 C)] 98.1 F (36.7 C) (02/10 0750) Pulse Rate:  [78-86] 86 (02/10 0750) Resp:  [16-18] 16 (02/10 0750) BP: (131-144)/(46-61) 142/49 (02/10 0750) SpO2:  [94 %-100 %] 100 % (02/10 0750) Weight:  [44.9 kg (99 lb)] 44.9 kg (99 lb) (02/10 0247) Blood pressure (!) 142/49, pulse 86, temperature 98.1 F (36.7 C), temperature source Oral, resp. rate 16, height 5\' 3"  (1.6 m), weight 44.9 kg (99 lb), SpO2 100 %.    Intake/Output from previous day: 02/09 0701 - 02/10 0700 In: 2804.4 [I.V.:2340; Blood:364.4; IV Piggyback:100] Out: -   Intake/Output this shift: Total I/O In: 3 [I.V.:3] Out: -    General appearance:  Somnolent, agreeable. Resp:  Few coarse rhonchi, otherwise clear. Cardio:  Regular rate no gallop. GI:  Soft, nontender nondistended. Bowel sounds positive. No masses. Extremities:  No edema.   Lab Results: Results for orders placed or performed during the hospital encounter of 01/03/18 (from the past 24 hour(s))  Hemoglobin     Status: Abnormal   Collection Time: 01/05/18 11:44 AM  Result Value Ref Range   Hemoglobin 10.0 (L) 12.0 - 16.0 g/dL  Basic metabolic panel     Status: Abnormal   Collection Time: 01/06/18  5:58 AM  Result Value Ref Range   Sodium 144 135 - 145 mmol/L   Potassium 3.7 3.5 - 5.1 mmol/L   Chloride 116 (H) 101 - 111 mmol/L   CO2 20 (L) 22 - 32 mmol/L   Glucose, Bld 88 65 - 99 mg/dL   BUN 34 (H) 6 - 20 mg/dL   Creatinine, Ser 0.94 0.44 - 1.00 mg/dL   Calcium 7.9 (L) 8.9 - 10.3 mg/dL   GFR calc non Af  Amer 53 (L) >60 mL/min   GFR calc Af Amer >60 >60 mL/min   Anion gap 8 5 - 15  CBC     Status: Abnormal   Collection Time: 01/06/18  5:58 AM  Result Value Ref Range   WBC 12.3 (H) 3.6 - 11.0 K/uL   RBC 3.65 (L) 3.80 - 5.20 MIL/uL   Hemoglobin 10.2 (L) 12.0 - 16.0 g/dL   HCT 31.0 (L) 35.0 - 47.0 %   MCV 84.9 80.0 - 100.0 fL   MCH 28.0 26.0 - 34.0 pg   MCHC 32.9 32.0 - 36.0 g/dL   RDW 16.5 (H) 11.5 - 14.5 %   Platelets 256 150 - 440 K/uL     Recent Labs    01/04/18 0352 01/04/18 1922 01/05/18 0441 01/05/18 1144 01/06/18 0558  WBC 16.6*  --  13.3*  --  12.3*  HGB 6.3* 7.5* 7.6* 10.0* 10.2*  HCT 20.0* 23.4* 23.3*  --  31.0*  PLT 383  --  240  --  256   BMET Recent Labs    01/04/18 0352 01/05/18 0441 01/06/18 0558  NA 145 142 144  K 2.9* 3.1* 3.7  CL 113* 116* 116*  CO2 23 19* 20*  GLUCOSE 107* 86 88  BUN 39* 43* 34*  CREATININE 0.86 0.98 0.94  CALCIUM 7.5* 7.4* 7.9*   LFT Recent Labs  01/03/18 1549  PROT 6.3*  ALBUMIN 2.2*  AST 54*  ALT 23  ALKPHOS 110  BILITOT 0.2*   PT/INR Recent Labs    01/03/18 1549  LABPROT 14.2  INR 1.11   Hepatitis Panel No results for input(s): HEPBSAG, HCVAB, HEPAIGM, HEPBIGM in the last 72 hours. C-Diff No results for input(s): CDIFFTOX in the last 72 hours. No results for input(s): CDIFFPCR in the last 72 hours.   Studies/Results: No results found.  Scheduled Inpatient Medications:   . acidophilus  1 capsule Oral Daily  . amLODipine  10 mg Oral Daily  . atorvastatin  20 mg Oral q1800  . budesonide (PULMICORT) nebulizer solution  0.5 mg Nebulization BID  . chlorhexidine  15 mL Mouth Rinse BID  . cholecalciferol  400 Units Oral Daily  . cloNIDine  0.1 mg Oral TID  . ketorolac  1 drop Left Eye TID  . mouth rinse  15 mL Mouth Rinse q12n4p  . multivitamin with minerals  1 tablet Oral QHS  . nystatin  5 mL Oral QID  . pantoprazole (PROTONIX) IV  40 mg Intravenous Q12H  . sodium chloride flush  3 mL Intravenous  Q12H  . sodium chloride flush  3 mL Intravenous Q12H  . vitamin B-12  5,000 mcg Oral Daily    Continuous Inpatient Infusions:   . sodium chloride 50 mL/hr at 01/05/18 2244  . cefTRIAXone (ROCEPHIN)  IV Stopped (01/05/18 1618)  . sodium chloride     And  . sodium chloride      PRN Inpatient Medications:  acetaminophen **OR** acetaminophen, guaifenesin, indomethacin, ipratropium-albuterol, ondansetron **OR** ondansetron (ZOFRAN) IV, traMADol  Miscellaneous: Hemoglobin 10.2, up from 10.0 yesterday.  Assessment:  1. Anemia - acute blood loss - Improved HGB  To 10 after transfusion. Now 10.2.  Given prior plavix,dailynsaid use concern for peptic ulcer as source. Differential also includes AVM, etc. plavix has been d/c. .   2. Diarrhea - improving Hx  C. Diff as well given recent hospitalizations.Continue supportive care.  3. Hypokalemia - replace per primary team.   4. Dysphagia - S/P swallowing study per ST. Improved with thickened diet.  5. UTI - per primary team.  6. Severe Peripheral vascular disease.   Plan:  1. Continue observation on PPI but holding Plavix. 2. EGD when feasible.  3. Following.  no new recommendations. Assessment and details discuss with family.      Draedyn Weidinger K. Alice Reichert, M.D. 01/06/2018, 10:22 AM

## 2018-01-06 NOTE — Progress Notes (Signed)
Pharmacy consulted for electrolyte replacement protocol:   Patient w/ norovirus  Goal of therapy: Electrolytes within normal limits:  K 3.5 - 5.1 Corrected Ca 8.9 - 10.3 Phos 2.5 - 4.6 Mg 1.7 - 2.4   Assessment: Lab Results  Component Value Date   CREATININE 0.94 01/06/2018   BUN 34 (H) 01/06/2018   NA 144 01/06/2018   K 3.7 01/06/2018   CL 116 (H) 01/06/2018   CO2 20 (L) 01/06/2018    Plan:Electrolytes WNL. F/u labs in am   Chinita Greenland PharmD Clinical Pharmacist 01/06/2018

## 2018-01-07 LAB — BASIC METABOLIC PANEL
Anion gap: 10 (ref 5–15)
BUN: 23 mg/dL — AB (ref 6–20)
CALCIUM: 7.8 mg/dL — AB (ref 8.9–10.3)
CHLORIDE: 110 mmol/L (ref 101–111)
CO2: 20 mmol/L — AB (ref 22–32)
CREATININE: 0.71 mg/dL (ref 0.44–1.00)
GFR calc Af Amer: >60 mL/min (ref >60)
GFR calc non Af Amer: >60 mL/min (ref >60)
Glucose, Bld: 97 mg/dL (ref 65–99)
Potassium: 3 mmol/L — ABNORMAL LOW (ref 3.5–5.1)
Sodium: 140 mmol/L (ref 135–145)

## 2018-01-07 LAB — MAGNESIUM: Magnesium: 1.7 mg/dL (ref 1.7–2.4)

## 2018-01-07 MED ORDER — SODIUM CHLORIDE 0.9 % IV SOLN
1.0000 g | INTRAVENOUS | Status: AC
  Administered 2018-01-07 – 2018-01-08 (×2): 1 g via INTRAVENOUS
  Filled 2018-01-07 (×2): qty 10

## 2018-01-07 MED ORDER — MAGNESIUM SULFATE 4 GM/100ML IV SOLN
4.0000 g | Freq: Once | INTRAVENOUS | Status: AC
  Administered 2018-01-07: 4 g via INTRAVENOUS
  Filled 2018-01-07: qty 100

## 2018-01-07 MED ORDER — GERHARDT'S BUTT CREAM
TOPICAL_CREAM | Freq: Two times a day (BID) | CUTANEOUS | Status: DC
  Administered 2018-01-07 – 2018-01-14 (×15): via TOPICAL
  Filled 2018-01-07: qty 1

## 2018-01-07 MED ORDER — POTASSIUM CHLORIDE 20 MEQ PO PACK
20.0000 meq | PACK | Freq: Two times a day (BID) | ORAL | Status: AC
  Administered 2018-01-07 – 2018-01-08 (×4): 20 meq via ORAL
  Filled 2018-01-07 (×4): qty 1

## 2018-01-07 NOTE — Progress Notes (Signed)
Report that patient had a run of SVT with heart rate in the 180s, non-sustained. Patient asymptomatic, heart rate came back down to the 70s. MD notified. Will continue to monitor patient.

## 2018-01-07 NOTE — Clinical Social Work Note (Signed)
CSW attempted to call patient's son Frank Pilger, [phone], to present bed offers, CSW left message, awaiting for call back.  CSW to continue to follow patient's progress throughout discharge planning.  Jones Broom. Norval Morton, MSW, [place]  01/07/2018 2:39 PM

## 2018-01-07 NOTE — Progress Notes (Signed)
GI Progress Note  Patient without any significant rebleeding.  Abdominal pain slightly improved.  Vital signs are reviewed.  Impression:  1.  Sepsis- continued on IV Rocephin-slightly better.  2.  Melena-currently resolved.  PPI use appears to have had a positive impact.  Currently patient still too sick medically to proceed with otherwise elective upper endoscopy.  3.  viral syndrome with diarrhea-on supportive care  Plan:  1.  Continue serial monitoring and medical care as prescribed by primary team.  2.  Continue acid suppression.  3.  No plans for endoscopy at this time.  Call back if there are any clinical changes warranting an inpatient endoscopy.  Thank you

## 2018-01-07 NOTE — Progress Notes (Signed)
   [place] [facility] Day: 4 days Dawn [name] is a 85 y.o. female presenting with Emesis and Diarrhea and noted to have Norovirus, UTI and suspected Asp. Pneumonia.     Advance care planning discussed with patient  with additional Family at bedside. All questions in regards to overall condition and expected prognosis answered. The decision was made to continue current code status  CODE STATUS: full Time spent: 18 minutes

## 2018-01-07 NOTE — Progress Notes (Signed)
Dawn [name] at [place] NAME: Dawn [name]    MR#:  [id number]  DATE OF BIRTH:  November 23, 1928  SUBJECTIVE:   Diarrhea improving. Potassium noted be a bit lower today. Patient started on a dysphagia 3 diet with nectar thick liquids and is tolerating it. Patient's son is at bedside and is feeding her. STill quite weak and Dawn [name].    REVIEW OF SYSTEMS:    Review of Systems  Constitutional: Negative for chills and fever.  HENT: Negative for congestion and tinnitus.   Eyes: Negative for blurred vision and double vision.  Respiratory: Negative for cough, shortness of breath and wheezing.   Cardiovascular: Negative for chest pain, orthopnea and PND.  Gastrointestinal: Positive for diarrhea. Negative for abdominal pain, nausea and vomiting.  Genitourinary: Negative for dysuria and hematuria.  Neurological: Positive for weakness. Negative for dizziness, sensory change and focal weakness.  All other systems reviewed and are negative.   Nutrition: Dysphagia III with nectar thick liquids Tolerating Diet: Yes Tolerating PT: Await Eval.   DRUG ALLERGIES:   Allergies  Allergen Reactions  . Percocet [Oxycodone-Acetaminophen] Itching  . Percodan [Oxycodone-Aspirin] Itching    VITALS:  Blood pressure (!) 155/50, pulse 86, temperature 98 F (36.7 C), temperature source Oral, resp. rate 18, height 5\' 3"  (1.6 m), weight 44.5 kg (98 lb), SpO2 96 %.  PHYSICAL EXAMINATION:   Physical Exam  GENERAL:  84 y.o.-year-old thin frail patient lying in bed in no acute distress.  EYES: Pupils equal, round, reactive to light and accommodation. No scleral icterus. Extraocular muscles intact.  HEENT: Head atraumatic, normocephalic. Oropharynx and nasopharynx clear.  NECK:  Supple, no jugular venous distention. No thyroid enlargement, no tenderness.  LUNGS: Poor Resp. effort, no wheezing, rales, rhonchi. No use of accessory muscles of respiration.   CARDIOVASCULAR: S1, S2 normal. No murmurs, rubs, or gallops.  ABDOMEN: Soft, nontender, nondistended. Bowel sounds present. No organomegaly or mass.  EXTREMITIES: No cyanosis, clubbing or edema b/l.    NEUROLOGIC: Cranial nerves II through XII are intact. No focal Motor or sensory deficits b/l.  Globally weak. PSYCHIATRIC: The patient is alert and oriented x 2.  SKIN: No obvious rash, lesion, or ulcer.    LABORATORY PANEL:   CBC Recent Labs  Lab 01/06/18 0558  WBC 12.3*  HGB 10.2*  HCT 31.0*  PLT 256   ------------------------------------------------------------------------------------------------------------------  Chemistries  Recent Labs  Lab 01/03/18 1549  01/07/18 0356  NA 144   < > 140  K 2.9*   < > 3.0*  CL 110   < > 110  CO2 21*   < > 20*  GLUCOSE 138*   < > 97  BUN 46*   < > 23*  CREATININE 0.96   < > 0.71  CALCIUM 7.6*   < > 7.8*  MG  --    < > 1.7  AST 54*  --   --   ALT 23  --   --   ALKPHOS 110  --   --   BILITOT 0.2*  --   --    < > = values in this interval not displayed.   ------------------------------------------------------------------------------------------------------------------  Cardiac Enzymes Recent Labs  Lab 01/03/18 1141  TROPONINI <0.03   ------------------------------------------------------------------------------------------------------------------  RADIOLOGY:  No results found.   ASSESSMENT AND PLAN:   84 year old female with a history essential hypertension who presents from inpatient rehabilitation due to diarrhea and generalized weakness.  1. Sepsis: Patient presents with tachypnea and tachycardia  as well as leukocytosis Sepsis is due to UTI - Urine culture growing Providenicia.  - cont. Rocephin X 5 days.    2. UTI: Continue Rocephin and Urine culture + for Providencia.   - improving.   3. Diarrhea with Norovirus - diarrhea is improving.  - cont. Probiotics.  Cont. Imodium prn. - started on Dysphagia III  diet with nectar thick liquids and tolerating it.   4. Acute on chronic blood loss anemia: Anemia panel consistent with iron deficiency.  - Status post 2 units of packed blood cells transfusion. Hemoglobin improved and currently stable at 10.2. -Continue PPI twice a day. Hold aspirin, Plavix. Seen by gastroenterology and no plans for acute intervention and continue supportive care for now. Seen by speech therapy and started on a dysphagia 3 diet with nectar thin liquids and tolerating it.   5. Hypokalemia: Due to diarrhea, cont. Oral Supplements. Mg. Level was 1.7 and will give 4 gm of Mg.  - Repeat level in a.m.   6. Protein calorie malnutrition, severe: started on Dysphagia III, nectar thick liquids.  - encourage PO intake. Improving.    7. History of COPD without exacerbation - cont. PRN duonebs, Pulmicort nebs.   8. PVD: Hold Plavix for now due to anemia - Continue atorvastatin  9. Essential hypertension: Continue Norvasc, clonidine  Attempted to talk to pt's son about code status and he wants pt. To be a full code for now.   Await PT eval. Pt's family does not want her going back to the SNF she came from.   All the records are reviewed and case discussed with Care Management/Social Worker. Management plans discussed with the patient, family and they are in agreement.  CODE STATUS: Full code  DVT Prophylaxis: Ted's & SCD's.   TOTAL TIME TAKING CARE OF THIS PATIENT: 30 minutes.   POSSIBLE D/C IN 2-3 DAYS, DEPENDING ON CLINICAL CONDITION.   Henreitta Leber M.D on 01/07/2018 at 2:07 PM  Between 7am to 6pm - Pager - [phone]  After 6pm go to [email or web address] - Proofreader  [facility]  Office  [phone]  CC: Primary care physician; Idelle Crouch, MD

## 2018-01-07 NOTE — Progress Notes (Signed)
Pharmacy consulted for electrolyte replacement protocol:   Patient w/ norovirus  Goal of therapy: Electrolytes within normal limits:  K 3.5 - 5.1 Corrected Ca 8.9 - 10.3 Phos 2.5 - 4.6 Mg 1.7 - 2.4   Assessment: Lab Results  Component Value Date   CREATININE 0.71 01/07/2018   BUN 23 (H) 01/07/2018   NA 140 01/07/2018   K 3.0 (L) 01/07/2018   CL 110 01/07/2018   CO2 20 (L) 01/07/2018    Plan:K =3.0. Patient currently receiving KCl 20 mEq PO BID x 2 days. Will recheck electrolytes with am labs.    Larene Beach, PharmD  Clinical Pharmacist 01/07/2018

## 2018-01-07 NOTE — Consult Note (Addendum)
[facility] Nurse wound consult note Reason for Consult:moisture associated skin damage to coccyx and upper buttocks from frequent, loose stools. Moisture and pressure over a bony prominence.  Skin tear to left arm with approximated edges and silicone contact layer in place.  Cover with kerlix and tape.  Wound type:MASD to sacrum and coccyx, upper buttocks/stage 2 pressure to coccyx Trauma to left arm Pressure Injury POA: NA Measurement: 1 cm x 1 cm  x 0.1 cm  Coccyx wound with blanchable erythema to buttocks and sacrum Wound ZOX:WRUE and moist Drainage (amount, consistency, odor) scant weeping Periwound:erythema Dressing procedure/placement/frequency:Cleanse buttocks with soap and water and pat dry.  Apply Gerhardts butt paste twice daily and PRN soilage.  No disposable briefs or underpads.  Allow dermatherapy to be in contact with skin to promote healing.  Will not follow at this time.  Please re-consult if needed.  Domenic Moras RN BSN [place] Pager [phone]

## 2018-01-08 LAB — CULTURE, BLOOD (ROUTINE X 2)
Culture: NO GROWTH
Culture: NO GROWTH
SPECIAL REQUESTS: ADEQUATE
SPECIAL REQUESTS: ADEQUATE

## 2018-01-08 LAB — BASIC METABOLIC PANEL
ANION GAP: 8 (ref 5–15)
BUN: 19 mg/dL (ref 6–20)
CALCIUM: 7.5 mg/dL — AB (ref 8.9–10.3)
CO2: 22 mmol/L (ref 22–32)
Chloride: 106 mmol/L (ref 101–111)
Creatinine, Ser: 0.69 mg/dL (ref 0.44–1.00)
GFR calc Af Amer: >60 mL/min (ref >60)
GLUCOSE: 102 mg/dL — AB (ref 65–99)
Potassium: 3.6 mmol/L (ref 3.5–5.1)
SODIUM: 136 mmol/L (ref 135–145)

## 2018-01-08 LAB — MAGNESIUM: MAGNESIUM: 2.3 mg/dL (ref 1.7–2.4)

## 2018-01-08 MED ORDER — DIPHENHYDRAMINE HCL 25 MG PO CAPS
25.0000 mg | ORAL_CAPSULE | Freq: Every evening | ORAL | Status: DC | PRN
  Administered 2018-01-10 – 2018-01-13 (×2): 25 mg via ORAL
  Filled 2018-01-08 (×3): qty 1

## 2018-01-08 MED ORDER — VITAMIN B-12 1000 MCG PO TABS
1000.0000 ug | ORAL_TABLET | Freq: Every day | ORAL | Status: DC
  Administered 2018-01-09 – 2018-01-14 (×6): 1000 ug via ORAL
  Filled 2018-01-08 (×6): qty 1

## 2018-01-08 MED ORDER — OCUVITE-LUTEIN PO CAPS
1.0000 | ORAL_CAPSULE | Freq: Every day | ORAL | Status: DC
  Administered 2018-01-08 – 2018-01-14 (×6): 1 via ORAL
  Filled 2018-01-08 (×7): qty 1

## 2018-01-08 MED ORDER — LOPERAMIDE HCL 2 MG PO CAPS
2.0000 mg | ORAL_CAPSULE | Freq: Every day | ORAL | Status: DC | PRN
  Administered 2018-01-08: 2 mg via ORAL
  Filled 2018-01-08: qty 1

## 2018-01-08 NOTE — Clinical Social Work Note (Signed)
CSW spoke to patient's son, who is interested in going to a different facility.  Patient's son prefers [facility], CSW presented bed offers, patient's son is going to make a decision on offers that were given.   4:30 pm  CSW attempted to contact patient's son with other bed offers, left a message awaiting for call back from son.  Jones Broom. [place], MSW, [place]  01/08/2018 5:16 PM

## 2018-01-08 NOTE — Progress Notes (Signed)
Physical Therapy Treatment Patient Details Name: Dawn [name] MRN: [id number] DOB: February 07, 1928 Today's Date: 01/08/2018    History of Present Illness 84 y.o. female with norovirus was readmitted after recent L femoral endarterectomy, went to SNF and back to [facility].  Has UTI, elevated lactic acid, tachypnea, leukocytosis and Sepsis, awaiting a transfusion.  PMHx:  COPD, PVD, HTN    PT Comments    Participated in exercises as described below.  Pt inc loose BM, no blood noted.  Nurse tech in to assist with care.  After cleaning, pt was able to transition to edge of bed with mod a x 2 and remain upright for 10 minutes with min guard/assist.  Pt did not feel like she was able to stand today due to fatigue and weakness but put in good effort during session stating she has felt better today than she has in days.  Returned to supine with max a x 1 and max a x 2 to reposition in bed.  It was noted that pt has a red area on right heel.  Bilateral heels floated on pillows with a pillow at end of bed to support feet and prevent foot drop.  Discussed and educated with family.  Answered questions as appropriate.   Follow Up Recommendations  SNF     Equipment Recommendations  None recommended by PT    Recommendations for Other Services       Precautions / Restrictions Precautions Precautions: Fall Precaution Comments: has purwick, ENTERIC Restrictions Weight Bearing Restrictions: No    Mobility  Bed Mobility Overal bed mobility: Needs Assistance Bed Mobility: Rolling;Supine to Sit;Sit to Supine Rolling: Mod assist   Supine to sit: Mod assist Sit to supine: Max assist      Transfers                 General transfer comment: deferred due to weakness  Ambulation/Gait             General Gait Details: unable   Stairs            Wheelchair Mobility    Modified Rankin (Stroke Patients Only)       Balance Overall balance assessment: Needs  assistance Sitting-balance support: Feet supported;Bilateral upper extremity supported Sitting balance-Leahy Scale: Poor                                      Cognition Arousal/Alertness: Awake/alert Behavior During Therapy: WFL for tasks assessed/performed Overall Cognitive Status: Within Functional Limits for tasks assessed                                        Exercises Other Exercises Other Exercises: BLE AROM for ankle pumps, SLR, ab/add and heel slides with light tactile support to prevent sheering of skin. Other Exercises: Pt inc of BM.  Nurse tech in to assist with care.  Verbal cues for rolling and hand placments,  unable to hold sidelying without support. Other Exercises: Seated LAQ x 10 BLE - unable to complete full ROM Other Exercises: sat EOB x 10 minutes     General Comments        Pertinent Vitals/Pain Pain Assessment: Faces Pain Score: 4  Pain Location: BLE tenderness Pain Descriptors / Indicators: Tender Pain Intervention(s): Limited activity within patient's tolerance;Monitored during session  Home Living                      Prior Function            PT Goals (current goals can now be found in the care plan section) Progress towards PT goals: Progressing toward goals    Frequency    Min 2X/week      PT Plan Current plan remains appropriate    Co-evaluation              AM-PAC PT "6 Clicks" Daily Activity  Outcome Measure  Difficulty turning over in bed (including adjusting bedclothes, sheets and blankets)?: Unable Difficulty moving from lying on back to sitting on the side of the bed? : Unable Difficulty sitting down on and standing up from a chair with arms (e.g., wheelchair, bedside commode, etc,.)?: Unable Help needed moving to and from a bed to chair (including a wheelchair)?: Total Help needed walking in hospital room?: Total Help needed climbing 3-5 steps with a railing? : Total 6  Click Score: 6    End of Session Equipment Utilized During Treatment: Oxygen Activity Tolerance: Patient tolerated treatment well;Patient limited by fatigue Patient left: in bed;with bed alarm set;with call bell/phone within reach;with family/visitor present Nurse Communication: Other (comment) Pain - part of body: Leg     Time: 1100-1133 PT Time Calculation (min) (ACUTE ONLY): 33 min  Charges:  $Therapeutic Exercise: 8-22 mins $Therapeutic Activity: 8-22 mins                    G Codes:       Chesley Noon, PTA 01/08/18, 12:06 PM

## 2018-01-08 NOTE — Progress Notes (Signed)
Nutrition Follow Up Note   DOCUMENTATION CODES:   Severe malnutrition in context of social or environmental circumstances  INTERVENTION:   Recommend decrease vitamin B12 dose to 1000 mcg daily   Add Ocuvite daily for wound healing (provides zinc, vitamin A, vitamin C, Vitamin E, copper, and selenium)  MVI daily   Magic cup TID with meals, each supplement provides 290 kcal and 9 grams of protein  Vital Cuisine BID, each supplement provides 520kcal and 22g of protein.   Assist with meals  NUTRITION DIAGNOSIS:   Severe Malnutrition related to social / environmental circumstances as evidenced by severe fat depletion, severe muscle depletion  GOAL:   Patient will meet greater than or equal to 90% of their needs  MONITOR:   PO intake, I & O's, Labs, Supplement acceptance, Skin, Weight trends  ASSESSMENT:   Dawn [name] is an 84 yo female with PMH Skin Cancer, HTN, PVD,  FTT, presents with 1-2 days of nausea, vomiting, diarrhea, generalized weakness, fatigue and altered mental status. 4-5 pound weight loss in the past 1-2 weeks. Admitted with Sepsis secondary to UTI, acute dehydration.   Pt s/p CSE 2/9 approved for dysphagia 3/ nct thick diet  Spoke to RN, pt eating well today and finished 100% of her breakfast. Appetite improved. Pt's son at bedside and assists pt with meals. Pt will eat as long as someone continues to feed her. Pt eating Magic Cups with meals. RD will add Ocuvite to encourage wound healing. Pt noted to be on 5000 mcg B12 daily; recommend decrease to 1000 mcg daily as pt's B12 elevated. Per MD note, diarrhea improving. Pt with 3lb weight gain since admit.   Medications reviewed and include: risaquad, vit D, MVI, protonix, KCl, B12  Labs reviewed: K 3.6 wnl, Ca 7.5(L), Mg 2.3 wnl B12- >7500- 2/8  Diet Order:  Aspiration precautions DIET DYS 3 Room service appropriate? Yes; Fluid consistency: Nectar Thick  EDUCATION NEEDS:   Education needs have been  addressed  Skin:  Stage II: to Coccyx  Last BM:  2/10- type 6  Height:   Ht Readings from Last 1 Encounters:  01/03/18 5\' 3"  (1.6 m)    Weight:   Wt Readings from Last 1 Encounters:  01/08/18 196 lb (88.9 kg)    Ideal Body Weight:  52.27 kg  BMI:  Body mass index is 34.72 kg/m.  Estimated Nutritional Needs:   Kcal:  1000-1200kcal/day   Protein:  65-74g/day   Fluid:  >1L/day   Koleen Distance MS, RD, LDN Pager #[phone] After Hours Pager: [phone]

## 2018-01-08 NOTE — Progress Notes (Signed)
Pharmacy consulted for electrolyte replacement protocol:   Patient w/ norovirus  Goal of therapy: Electrolytes within normal limits:  K 3.5 - 5.1 Corrected Ca 8.9 - 10.3 Phos 2.5 - 4.6 Mg 1.7 - 2.4   Assessment: Lab Results  Component Value Date   CREATININE 0.69 01/08/2018   BUN 19 01/08/2018   NA 136 01/08/2018   K 3.6 01/08/2018   CL 106 01/08/2018   CO2 22 01/08/2018   PLAN: All labs wnl. No further supplementation warranted @ this time.    Larene Beach, PharmD  Clinical Pharmacist 01/08/2018

## 2018-01-08 NOTE — Progress Notes (Signed)
[facility] [place] NAME: Dawn [name]    MR#:  [id number]  DATE OF BIRTH:  [birth year]  SUBJECTIVE:   Diarrhea much improved. By mouth intake is improving. Potassium level is normal. Patient is more awake and following commands today. Patient's son is at bedside.  REVIEW OF SYSTEMS:    Review of Systems  Constitutional: Negative for chills and fever.  HENT: Negative for congestion and tinnitus.   Eyes: Negative for blurred vision and double vision.  Respiratory: Negative for cough, shortness of breath and wheezing.   Cardiovascular: Negative for chest pain, orthopnea and PND.  Gastrointestinal: Positive for diarrhea. Negative for abdominal pain, nausea and vomiting.  Genitourinary: Negative for dysuria and hematuria.  Neurological: Positive for weakness. Negative for dizziness, sensory change and focal weakness.  All other systems reviewed and are negative.   Nutrition: Dysphagia III with nectar thick liquids Tolerating Diet: Yes Tolerating PT: PT eval noted.   DRUG ALLERGIES:   Allergies  Allergen Reactions  . Percocet [Oxycodone-Acetaminophen] Itching  . Percodan [Oxycodone-Aspirin] Itching    VITALS:  Blood pressure (!) 132/51, pulse 87, temperature (!) 96.6 F (35.9 C), temperature source Oral, resp. rate 18, height 5\' 3"  (1.6 m), weight 88.9 kg (196 lb), SpO2 99 %.  PHYSICAL EXAMINATION:   Physical Exam  GENERAL:  84 y.o.-year-old thin frail patient lying in bed in no acute distress.  EYES: Pupils equal, round, reactive to light and accommodation. No scleral icterus. Extraocular muscles intact.  HEENT: Head atraumatic, normocephalic. Oropharynx and nasopharynx clear.  NECK:  Supple, no jugular venous distention. No thyroid enlargement, no tenderness.  LUNGS: Poor Resp. effort, no wheezing, rales, rhonchi. No use of accessory muscles of respiration.  CARDIOVASCULAR: S1, S2 normal. No murmurs, rubs, or gallops.   ABDOMEN: Soft, nontender, nondistended. Bowel sounds present. No organomegaly or mass.  EXTREMITIES: No cyanosis, clubbing or edema b/l.    NEUROLOGIC: Cranial nerves II through XII are intact. No focal Motor or sensory deficits b/l.  Globally weak. PSYCHIATRIC: The patient is alert and oriented x 2.  SKIN: No obvious rash, lesion, or ulcer.    LABORATORY PANEL:   CBC Recent Labs  Lab 01/06/18 0558  WBC 12.3*  HGB 10.2*  HCT 31.0*  PLT 256   ------------------------------------------------------------------------------------------------------------------  Chemistries  Recent Labs  Lab 01/03/18 1549  01/08/18 0546  NA 144   < > 136  K 2.9*   < > 3.6  CL 110   < > 106  CO2 21*   < > 22  GLUCOSE 138*   < > 102*  BUN 46*   < > 19  CREATININE 0.96   < > 0.69  CALCIUM 7.6*   < > 7.5*  MG  --    < > 2.3  AST 54*  --   --   ALT 23  --   --   ALKPHOS 110  --   --   BILITOT 0.2*  --   --    < > = values in this interval not displayed.   ------------------------------------------------------------------------------------------------------------------  Cardiac Enzymes Recent Labs  Lab 01/03/18 1141  TROPONINI <0.03   ------------------------------------------------------------------------------------------------------------------  RADIOLOGY:  No results found.   ASSESSMENT AND PLAN:   84 year old female with a history essential hypertension who presents from inpatient rehabilitation due to diarrhea and generalized weakness.  1. Sepsis: Patient presents with tachypnea and tachycardia as well as leukocytosis - Sepsis is due to UTI - Urine culture  growing Providenicia.  - Treated with Rocephin X 5 days.  Afebrile, hemodynamically stable.   2. UTI: Continue Rocephin and Urine culture + for Providencia.   - Treated for 5 days with IV Rocephin.   3. Diarrhea with Norovirus - diarrhea much improved.  - cont. Probiotics.  Cont. Imodium prn. - started on Dysphagia  III diet with nectar thick liquids and tolerating it and PO intake is improving.   4. Acute on chronic blood loss anemia: Anemia panel consistent with iron deficiency.  - Status post 2 units of packed blood cells transfusion. Hemoglobin improved and remains stable. -Continue PPI twice a day. Cont. To Hold aspirin, Plavix. Seen by gastroenterology and no plans for acute intervention and continue supportive care for now. Seen by speech therapy and started on a dysphagia 3 diet with nectar thin liquids and tolerating it well.   5. Hypokalemia: improved w/ supplementation and will cont. To monitor.  - Mg level normal.   6. Protein calorie malnutrition, severe: started on Dysphagia III, nectar thick liquids. - PO intake improving.    7. History of COPD without exacerbation - cont. PRN duonebs, Pulmicort nebs.   8. PVD: Hold Plavix for now due to anemia - Continue atorvastatin  9. Essential hypertension: Continue Norvasc, clonidine  ET eval noted and patient will be discharged to skilled nursing facility. Patient's family does not want her going back to the same facility she came from.  All the records are reviewed and case discussed with Care Management/Social Worker. Management plans discussed with the patient, family and they are in agreement.  CODE STATUS: Full code  DVT Prophylaxis: Ted's & SCD's.   TOTAL TIME TAKING CARE OF THIS PATIENT: 30 minutes.   POSSIBLE D/C IN 2-3 DAYS, DEPENDING ON CLINICAL CONDITION.   Henreitta Leber M.D on 01/08/2018 at 1:35 PM  Between 7am to 6pm - Pager - [phone]  After 6pm go to [email or web address] - Proofreader  [facility]  Office  [phone]  CC: Primary care physician; Idelle Crouch, MD

## 2018-01-09 ENCOUNTER — Encounter (INDEPENDENT_AMBULATORY_CARE_PROVIDER_SITE_OTHER): Payer: Medicare Other

## 2018-01-09 ENCOUNTER — Ambulatory Visit (INDEPENDENT_AMBULATORY_CARE_PROVIDER_SITE_OTHER): Payer: Medicare Other | Admitting: Vascular Surgery

## 2018-01-09 LAB — BASIC METABOLIC PANEL
Anion gap: 7 (ref 5–15)
BUN: 19 mg/dL (ref 6–20)
CALCIUM: 7.6 mg/dL — AB (ref 8.9–10.3)
CO2: 24 mmol/L (ref 22–32)
CREATININE: 0.78 mg/dL (ref 0.44–1.00)
Chloride: 107 mmol/L (ref 101–111)
GFR calc Af Amer: >60 mL/min (ref >60)
GLUCOSE: 111 mg/dL — AB (ref 65–99)
Potassium: 3.6 mmol/L (ref 3.5–5.1)
SODIUM: 138 mmol/L (ref 135–145)

## 2018-01-09 LAB — MAGNESIUM: MAGNESIUM: 1.8 mg/dL (ref 1.7–2.4)

## 2018-01-09 MED ORDER — PANTOPRAZOLE SODIUM 40 MG PO TBEC
40.0000 mg | DELAYED_RELEASE_TABLET | Freq: Two times a day (BID) | ORAL | Status: DC
  Administered 2018-01-10 – 2018-01-14 (×9): 40 mg via ORAL
  Filled 2018-01-09 (×8): qty 1

## 2018-01-09 NOTE — Plan of Care (Signed)
  Progressing Education: Knowledge of General Education information will improve 01/09/2018 1648 - Progressing by Rolley Sims, RN Health Behavior/Discharge Planning: Ability to manage health-related needs will improve 01/09/2018 1648 - Progressing by Rolley Sims, RN Clinical Measurements: Will remain free from infection 01/09/2018 1648 - Progressing by Rolley Sims, RN Diagnostic test results will improve 01/09/2018 1648 - Progressing by Rolley Sims, RN

## 2018-01-09 NOTE — Progress Notes (Signed)
[facility] [place] NAME: Dawn [name]    MR#:  [id number]  DATE OF BIRTH:  August 05, 1928  SUBJECTIVE:    Generalized weakness, better oral intake.  REVIEW OF SYSTEMS:    Review of Systems  Constitutional: Negative for chills and fever.  HENT: Negative for congestion and tinnitus.   Eyes: Negative for blurred vision and double vision.  Respiratory: Negative for cough, shortness of breath and wheezing.   Cardiovascular: Negative for chest pain, orthopnea and PND.  Gastrointestinal: Negative for abdominal pain, diarrhea, nausea and vomiting.  Genitourinary: Negative for dysuria and hematuria.  Neurological: Positive for weakness. Negative for dizziness, sensory change and focal weakness.  All other systems reviewed and are negative.   Nutrition: Dysphagia III with nectar thick liquids Tolerating Diet: Yes Tolerating PT: PT eval noted.   DRUG ALLERGIES:   Allergies  Allergen Reactions  . Percocet [Oxycodone-Acetaminophen] Itching  . Percodan [Oxycodone-Aspirin] Itching    VITALS:  Blood pressure (!) 166/57, pulse 76, temperature 98.4 F (36.9 C), resp. rate 18, height 5\' 3"  (1.6 m), weight 224 lb (101.6 kg), SpO2 98 %.  PHYSICAL EXAMINATION:   Physical Exam  GENERAL:  84 y.o.-year-old thin frail patient lying in bed in no acute distress.  EYES: Pupils equal, round, reactive to light and accommodation. No scleral icterus. Extraocular muscles intact.  HEENT: Head atraumatic, normocephalic. Oropharynx and nasopharynx clear.  NECK:  Supple, no jugular venous distention. No thyroid enlargement, no tenderness.  LUNGS: Poor Resp. effort, no wheezing, rales, rhonchi. No use of accessory muscles of respiration.  CARDIOVASCULAR: S1, S2 normal. No murmurs, rubs, or gallops.  ABDOMEN: Soft, nontender, nondistended. Bowel sounds present. No organomegaly or mass.  EXTREMITIES: No cyanosis, clubbing or edema b/l.    NEUROLOGIC: Cranial  nerves II through XII are intact. No focal Motor or sensory deficits b/l.  Globally weak. PSYCHIATRIC: The patient is alert and oriented x 2.  SKIN: No obvious rash, lesion, or ulcer.    LABORATORY PANEL:   CBC Recent Labs  Lab 01/06/18 0558  WBC 12.3*  HGB 10.2*  HCT 31.0*  PLT 256   ------------------------------------------------------------------------------------------------------------------  Chemistries  Recent Labs  Lab 01/03/18 1549  01/09/18 0952  NA 144   < > 138  K 2.9*   < > 3.6  CL 110   < > 107  CO2 21*   < > 24  GLUCOSE 138*   < > 111*  BUN 46*   < > 19  CREATININE 0.96   < > 0.78  CALCIUM 7.6*   < > 7.6*  MG  --    < > 1.8  AST 54*  --   --   ALT 23  --   --   ALKPHOS 110  --   --   BILITOT 0.2*  --   --    < > = values in this interval not displayed.   ------------------------------------------------------------------------------------------------------------------  Cardiac Enzymes Recent Labs  Lab 01/03/18 1141  TROPONINI <0.03   ------------------------------------------------------------------------------------------------------------------  RADIOLOGY:  No results found.   ASSESSMENT AND PLAN:   84 year old female with a history essential hypertension who presents from inpatient rehabilitation due to diarrhea and generalized weakness.  1. Sepsis: Patient presents with tachypnea and tachycardia as well as leukocytosis - Sepsis is due to UTI - Urine culture growing Providenicia.  - Treated with Rocephin X 5 days.  Afebrile, hemodynamically stable.   2. UTI: Continue Rocephin and Urine culture + for  Providencia.   - Treated for 5 days with IV Rocephin.   3. Diarrhea with Norovirus - diarrhea much improved.  - cont. Probiotics.  Cont. Imodium prn. - started on Dysphagia III diet with nectar thick liquids and tolerating it and PO intake is improving.   4. Acute on chronic blood loss anemia: Anemia panel consistent with iron  deficiency.  - Status post 2 units of packed blood cells transfusion. Hemoglobin improved and remains stable. Change to protonix PO twice a day. Cont. To Hold aspirin, Plavix. Seen by gastroenterology and no plans for acute intervention and continue supportive care for now. Seen by speech therapy and started on a dysphagia 3 diet with nectar thin liquids and tolerating it well.   5. Hypokalemia: improved w/ supplementation and will cont. To monitor.  - Mg level normal.   6. Protein calorie malnutrition, severe: started on Dysphagia III, nectar thick liquids. - PO intake improving.    7. History of COPD without exacerbation - cont. PRN duonebs, Pulmicort nebs.   8. PVD: Hold Plavix for now due to anemia - Continue atorvastatin  9. Essential hypertension: Continue Norvasc, clonidine  Waiting for discharge to different skilled nursing facility. Patient's family does not want her going back to the same facility she came from.   All the records are reviewed and case discussed with Care Management/Social Worker. Management plans discussed with the patient, her daughter and granddaughter and they are in agreement.  CODE STATUS: Full code  DVT Prophylaxis: Ted's & SCD's.   TOTAL TIME TAKING CARE OF THIS PATIENT: 30 minutes.   POSSIBLE D/C IN 2 DAYS, DEPENDING ON CLINICAL CONDITION.   Demetrios Loll M.D on 01/09/2018 at 2:46 PM  Between 7am to 6pm - Pager - [phone]  After 6pm go to [email or web address] - Proofreader  [facility]  Office  [phone]  CC: Primary care physician; Idelle Crouch, MD

## 2018-01-09 NOTE — Care Management (Signed)
Attending anticipates discharge within next 24 hours if remains stable.

## 2018-01-09 NOTE — Progress Notes (Signed)
Pharmacy consulted for electrolyte replacement protocol:   Patient w/ norovirus  Goal of therapy: Electrolytes within normal limits:  K 3.5 - 5.1 Corrected Ca 8.9 - 10.3 Phos 2.5 - 4.6 Mg 1.7 - 2.4   Assessment: Lab Results  Component Value Date   CREATININE 0.78 01/09/2018   BUN 19 01/09/2018   NA 138 01/09/2018   K 3.6 01/09/2018   CL 107 01/09/2018   CO2 24 01/09/2018   PLAN: All labs wnl. No further supplementation warranted @ this time.    Larene Beach, PharmD  Clinical Pharmacist 01/09/2018

## 2018-01-09 NOTE — Care Management Important Message (Signed)
Important Message  Patient Details  Name: Dawn [name] MRN: [id number] Date of Birth: Apr 07, 1928   Medicare Important Message Given:  Yes Signed IM notice given    Katrina Stack, RN 01/09/2018, 12:41 PM

## 2018-01-09 NOTE — Progress Notes (Signed)
Patient does not tolerate manual cpt. However, able to work with patient with flutter valve. Uses without difficulty. Non productive congested cough noted with use

## 2018-01-10 ENCOUNTER — Encounter: Payer: Self-pay | Admitting: *Deleted

## 2018-01-10 LAB — CBC
HEMATOCRIT: 32.3 % — AB (ref 35.0–47.0)
Hemoglobin: 10.8 g/dL — ABNORMAL LOW (ref 12.0–16.0)
MCH: 28.5 pg (ref 26.0–34.0)
MCHC: 33.3 g/dL (ref 32.0–36.0)
MCV: 85.4 fL (ref 80.0–100.0)
Platelets: 246 10*3/uL (ref 150–440)
RBC: 3.78 MIL/uL — AB (ref 3.80–5.20)
RDW: 17.2 % — ABNORMAL HIGH (ref 11.5–14.5)
WBC: 7.7 10*3/uL (ref 3.6–11.0)

## 2018-01-10 LAB — GLUCOSE, CAPILLARY
GLUCOSE-CAPILLARY: 86 mg/dL (ref 65–99)
Glucose-Capillary: 73 mg/dL (ref 65–99)

## 2018-01-10 NOTE — Clinical Social Work Note (Addendum)
CSW presented bed offers and patient's son chose [facility].  CSW spoke to [facility] and they can accept patient once she is medically ready for discharge.    Patient's son stated if patient is ready for discharge, and he does not feel that she is ready he will appeal the discharge.  CSW informed him that it is patient's right to appeal if he wants to.  Jones Broom. [place], MSW, [place]  01/10/2018 2:46 PM

## 2018-01-10 NOTE — Progress Notes (Signed)
[place] at [place] NAME: Dawn [name]    MR#:  [id number]  DATE OF BIRTH:  07/23/1928  SUBJECTIVE:    Generalized weakness and cough. On O2 [place] 2 L.  REVIEW OF SYSTEMS:    Review of Systems  Constitutional: Negative for chills and fever.  HENT: Negative for congestion and tinnitus.   Eyes: Negative for blurred vision and double vision.  Respiratory: Positive for cough. Negative for shortness of breath and wheezing.   Cardiovascular: Negative for chest pain, orthopnea and PND.  Gastrointestinal: Negative for abdominal pain, diarrhea, nausea and vomiting.  Genitourinary: Negative for dysuria and hematuria.  Neurological: Positive for weakness. Negative for dizziness, sensory change and focal weakness.  All other systems reviewed and are negative.   Nutrition: Dysphagia III with nectar thick liquids Tolerating Diet: Yes Tolerating PT: PT eval noted.   DRUG ALLERGIES:   Allergies  Allergen Reactions  . Percocet [Oxycodone-Acetaminophen] Itching  . Percodan [Oxycodone-Aspirin] Itching    VITALS:  Blood pressure (!) 135/48, pulse 83, temperature 97.7 F (36.5 C), temperature source Oral, resp. rate 20, height 5\' 3"  (1.6 m), weight 249 lb (112.9 kg), SpO2 94 %.  PHYSICAL EXAMINATION:   Physical Exam  GENERAL:  84 y.o.-year-old thin frail patient lying in bed in no acute distress.  EYES: Pupils equal, round, reactive to light and accommodation. No scleral icterus. Extraocular muscles intact.  HEENT: Head atraumatic, normocephalic. Oropharynx and nasopharynx clear.  NECK:  Supple, no jugular venous distention. No thyroid enlargement, no tenderness.  LUNGS: Poor Resp. effort, no wheezing, rales, rhonchi. No use of accessory muscles of respiration.  CARDIOVASCULAR: S1, S2 normal. No murmurs, rubs, or gallops.  ABDOMEN: Soft, nontender, nondistended. Bowel sounds present. No organomegaly or mass.  EXTREMITIES: No cyanosis, clubbing  or edema b/l.    NEUROLOGIC: Cranial nerves II through XII are intact. No focal Motor or sensory deficits b/l.  Globally weak. PSYCHIATRIC: The patient is alert and oriented x 2.  SKIN: No obvious rash, lesion, or ulcer.    LABORATORY PANEL:   CBC Recent Labs  Lab 01/10/18 0536  WBC 7.7  HGB 10.8*  HCT 32.3*  PLT 246   ------------------------------------------------------------------------------------------------------------------  Chemistries  Recent Labs  Lab 01/03/18 1549  01/09/18 0952  NA 144   < > 138  K 2.9*   < > 3.6  CL 110   < > 107  CO2 21*   < > 24  GLUCOSE 138*   < > 111*  BUN 46*   < > 19  CREATININE 0.96   < > 0.78  CALCIUM 7.6*   < > 7.6*  MG  --    < > 1.8  AST 54*  --   --   ALT 23  --   --   ALKPHOS 110  --   --   BILITOT 0.2*  --   --    < > = values in this interval not displayed.   ------------------------------------------------------------------------------------------------------------------  Cardiac Enzymes No results for input(s): TROPONINI in the last 168 hours. ------------------------------------------------------------------------------------------------------------------  RADIOLOGY:  No results found.   ASSESSMENT AND PLAN:   84 year old female with a history essential hypertension who presents from inpatient rehabilitation due to diarrhea and generalized weakness.  1. Sepsis: Patient presents with tachypnea and tachycardia as well as leukocytosis - Sepsis is due to UTI - Urine culture growing Providenicia.  - Treated with Rocephin X 5 days.  Afebrile, hemodynamically stable.   2.  UTI: Continue Rocephin and Urine culture + for Providencia.   - Treated for 5 days with IV Rocephin.   3. Diarrhea with Norovirus - diarrhea much improved.  - cont. Probiotics.  Cont. Imodium prn. - started on Dysphagia III diet with nectar thick liquids and tolerating it and PO intake is improving.   4. Acute on chronic blood loss anemia:  Anemia panel consistent with iron deficiency.  - Status post 2 units of packed blood cells transfusion. Hemoglobin improved and remains stable. Change to protonix PO twice a day. Cont. To Hold aspirin, Plavix. Seen by gastroenterology and no plans for acute intervention and continue supportive care for now. Seen by speech therapy and started on a dysphagia 3 diet with nectar thin liquids and tolerating it well.   5. Hypokalemia: improved w/ supplementation and will cont. To monitor.  - Mg level normal.   6. Protein calorie malnutrition, severe: started on Dysphagia III, nectar thick liquids. - PO intake improving.    7. History of COPD without exacerbation - cont. PRN duonebs, Pulmicort nebs.   8. PVD: Hold Plavix for now due to anemia - Continue atorvastatin  9. Essential hypertension: Continue Norvasc, clonidine  Her son chose [facility] nursing facility. All the records are reviewed and case discussed with Care Management/Social Worker. Management and discharge plans discussed with the patient's son, who is not in agreement with discharge today.  CODE STATUS: Full code  DVT Prophylaxis: Ted's & SCD's.   TOTAL TIME TAKING CARE OF THIS PATIENT: 38 minutes.   POSSIBLE D/C IN 1-2 DAYS, DEPENDING ON CLINICAL CONDITION.   Demetrios Loll M.D on 01/10/2018 at 3:28 PM  Between 7am to 6pm - Pager - [phone]  After 6pm go to [email or web address] - Proofreader  [facility]  Office  [phone]  CC: Primary care physician; Idelle Crouch, MD

## 2018-01-10 NOTE — Progress Notes (Signed)
Wound care performed.  Open area seems much small that a few days ago and pink rather than red.

## 2018-01-10 NOTE — Progress Notes (Signed)
Physical Therapy Treatment Patient Details Name: Dawn [name] MRN: [id number] DOB: 20-Apr-1928 Today's Date: 01/10/2018    History of Present Illness 84 y.o. female with norovirus was readmitted after recent L femoral endarterectomy, went to SNF and back to [facility].  Has UTI, elevated lactic acid, tachypnea, leukocytosis and Sepsis, awaiting a transfusion.  PMHx:  COPD, PVD, HTN    PT Comments    Participated in exercises as described below.  Significant improvement in bed mobility today.  Min assist with encouragement for transitions.  Remained sitting min min guard but no LOB's today.  She was able to stand x 3 with encouragement with mod/max a x 1.  Very fearful with feet blocked to prevent sliding.  Overall significant progress with mobility.  SNF remains appropriate for discharge.   Follow Up Recommendations  SNF     Equipment Recommendations  None recommended by PT    Recommendations for Other Services       Precautions / Restrictions Precautions Precautions: Fall Precaution Comments: has purwick, ENTERIC Restrictions Weight Bearing Restrictions: No    Mobility  Bed Mobility Overal bed mobility: Needs Assistance Bed Mobility: Supine to Sit;Sit to Supine Rolling: Min assist   Supine to sit: Min assist        Transfers Overall transfer level: Needs assistance Equipment used: None Transfers: Sit to/from Stand Sit to Stand: Mod assist            Ambulation/Gait                 Stairs            Wheelchair Mobility    Modified Rankin (Stroke Patients Only)       Balance Overall balance assessment: Needs assistance Sitting-balance support: Feet supported;Bilateral upper extremity supported Sitting balance-Leahy Scale: Fair     Standing balance support: Bilateral upper extremity supported;During functional activity Standing balance-Leahy Scale: Zero                              Cognition Arousal/Alertness:  Awake/alert Behavior During Therapy: WFL for tasks assessed/performed Overall Cognitive Status: Within Functional Limits for tasks assessed                                        Exercises Other Exercises Other Exercises: BLE AROM for ankle pumps, SLR, ab/add and heel slides with light tactile support to prevent sheering of skin.    General Comments        Pertinent Vitals/Pain Pain Assessment: 0-10 Faces Pain Scale: Hurts little more Pain Location: BLE tenderness Pain Descriptors / Indicators: Tender Pain Intervention(s): Limited activity within patient's tolerance    Home Living                      Prior Function            PT Goals (current goals can now be found in the care plan section) Progress towards PT goals: Progressing toward goals    Frequency    Min 2X/week      PT Plan Current plan remains appropriate    Co-evaluation              AM-PAC PT "6 Clicks" Daily Activity  Outcome Measure  Difficulty turning over in bed (including adjusting bedclothes, sheets and blankets)?: Unable Difficulty moving from lying on  back to sitting on the side of the bed? : Unable Difficulty sitting down on and standing up from a chair with arms (e.g., wheelchair, bedside commode, etc,.)?: Unable Help needed moving to and from a bed to chair (including a wheelchair)?: Total Help needed walking in hospital room?: Total Help needed climbing 3-5 steps with a railing? : Total 6 Click Score: 6    End of Session Equipment Utilized During Treatment: Oxygen Activity Tolerance: Patient tolerated treatment well;Patient limited by fatigue Patient left: in bed;with bed alarm set;with call bell/phone within reach;with family/visitor present Nurse Communication: Other (comment) Pain - part of body: Leg     Time: 0223-3612 PT Time Calculation (min) (ACUTE ONLY): 24 min  Charges:  $Therapeutic Exercise: 8-22 mins $Therapeutic Activity: 8-22  mins                    G Codes:       Chesley Noon, PTA 01/10/18, 11:43 AM

## 2018-01-11 LAB — BASIC METABOLIC PANEL
Anion gap: 6 (ref 5–15)
BUN: 19 mg/dL (ref 6–20)
CALCIUM: 8.5 mg/dL — AB (ref 8.9–10.3)
CHLORIDE: 103 mmol/L (ref 101–111)
CO2: 29 mmol/L (ref 22–32)
CREATININE: 0.68 mg/dL (ref 0.44–1.00)
GFR calc non Af Amer: >60 mL/min (ref >60)
Glucose, Bld: 90 mg/dL (ref 65–99)
Potassium: 3.6 mmol/L (ref 3.5–5.1)
SODIUM: 138 mmol/L (ref 135–145)

## 2018-01-11 LAB — PHOSPHORUS: Phosphorus: 2.7 mg/dL (ref 2.5–4.6)

## 2018-01-11 LAB — MAGNESIUM: Magnesium: 1.6 mg/dL — ABNORMAL LOW (ref 1.7–2.4)

## 2018-01-11 MED ORDER — IPRATROPIUM-ALBUTEROL 0.5-2.5 (3) MG/3ML IN SOLN: 3.0000 mL | Freq: Four times a day (QID) | RESPIRATORY_TRACT | Status: DC | PRN

## 2018-01-11 MED ORDER — PANTOPRAZOLE SODIUM 40 MG PO TBEC: 40.0000 mg | DELAYED_RELEASE_TABLET | Freq: Two times a day (BID) | ORAL | Status: DC

## 2018-01-11 MED ORDER — TRAMADOL HCL 50 MG PO TABS: 50.0000 mg | ORAL_TABLET | Freq: Four times a day (QID) | ORAL | 0 refills | Status: DC | PRN

## 2018-01-11 MED ORDER — GERHARDT'S BUTT CREAM: 1.0000 "application " | TOPICAL_CREAM | Freq: Two times a day (BID) | CUTANEOUS | Status: DC

## 2018-01-11 MED ORDER — ONDANSETRON HCL 4 MG PO TABS: 4.0000 mg | ORAL_TABLET | Freq: Four times a day (QID) | ORAL | 0 refills | Status: DC | PRN

## 2018-01-11 MED ORDER — MAGNESIUM SULFATE 2 GM/50ML IV SOLN
2.0000 g | Freq: Once | INTRAVENOUS | Status: AC
  Administered 2018-01-11: 2 g via INTRAVENOUS
  Filled 2018-01-11: qty 50

## 2018-01-11 NOTE — Care Management (Addendum)
Left notice in room for son requesting alternate way of of contacting him.  Discussed with primary nurse to pass on in report that need son's cell phone number.  Discharge has been canceled by attending due to elevated temp 100.7

## 2018-01-11 NOTE — Care Management Important Message (Signed)
Important Message  Patient Details  Name: Dawn [name] MRN: [id number] Date of Birth: Jan 19, 1928   Medicare Important Message Given:  Yes Hung notice on the wall for son   Mathis Bud 01/11/2018, 5:30 PM

## 2018-01-11 NOTE — Progress Notes (Signed)
Speech Language Pathology Treatment: Dysphagia  Patient Details Name: Dawn [name] MRN: [id number] DOB: 1928-04-10 Today's Date: 01/11/2018 Time: 6122-4497 SLP Time Calculation (min) (ACUTE ONLY): 40 min  Assessment / Plan / Recommendation Clinical Impression  Pt seen for ongoing assessment of toleration of diet; education. Son present in room. Pt feeding self breakfast meal of eggs and sausage, Nectar liquids. Pt has been on a modified diet during Rehab d/t min dysphagia per Son per Rehab. Pt has been improving overall per Son and is looking to return to Rehab. Pulmonary status has been improving per NSG, Son.  Pt consumed trials of soft solids and Nectar liquids via Cup w/ no overt s/s of aspiration noted; no decline in vocal quality or respiratory status during/post trials. Swallows were all timely, and Oral phase appeared grossly wfl w/ oral intake - appropriate oral clearing w/ foods/liquids. Pt does wear dentures/partial and benefits from alternating foods/liquids during her meal. Discussed w/ Son and pt the need for follow through w/ aspiration precautions including Upright Positioning especially when in bed eating meals(pillows down low behind back); NOT using straws as pt can hold cup independently to drink liquids; dysphagia during extended illness and risk for airway compromise thus pulmonary decline; diet consistency for easier oral intake and reducing exertion overall; food options and preparation w/ condiments. Recommended rest breaks to lessen fatigue/SOB; and use of Pills in Puree - Whole as able but crushed if necessary for easier oral-esophageal clearing.  Recommend continue w/ current diet as she returns to Rehab. ST f/u for trials to upgrade; further object assessment if indicated. NSG updated and will reconsult if any decline in status while admitted.     HPI HPI: Dawn [name]  is a 84 y.o. female with a known history per below, recently sent to inpatient rehab status  post vascular procedure to her lower extremities for peripheral vascular disease, presents with 1-2-day history of worsening nausea, vomiting, diarrhea, generalized weakness, fatigue, altered mental status, most of the information is coming from the patient's son whom has numerous complaints, which also includes chronic cough-no etiology, chest x-ray done on Monday which was negative, concern for oral thrush-has not been treated per's patient's son, 4-5 pound weight loss within the last week or 2,, poor p.o. intake, disputes any history of malnutrition-though records indicate severe protein energy malnutrition/failure to thrive-the patient's son is argumentative when addressing this issue as well as advanced directives, daughter-in-law is also present at the bedside, noted hypoxia-satting 88% on room air, improved with nasal cannula oxygen, ER workup noted for sepsis due to UTI, potassium 3.4, creatinine 1.2, BUN 60, white count 15.6, hemoglobin 9.2, lactic acid 3.6, AST 55, patient is now being admitted for acute sepsis secondary to acute urinary tract infection, acute dehydration, acute lactic acidosis, and diarrhea. Pt had a BSE when she was last admitted in December and was recommended to have Dysphagia 3 with thin liquids. Since discharge and admission to SNF, family reports pt has been on Mech soft w/pureed meats and nectar thick liquids. She is tolerating this diet well and would be recommended to continue such d/t extended illness and weakness.       SLP Plan  All goals met       Recommendations  Diet recommendations: Dysphagia 3 (mechanical soft);Nectar-thick liquid(this is baseline during her Rehab) Liquids provided via: Cup;No straw Medication Administration: Whole meds with puree(but Crushed if needed for easier, safer swallowing) Supervision: Patient able to self feed;Intermittent supervision to cue for compensatory strategies(precautions; setup  of meal) Compensations: Minimize  environmental distractions;Slow rate;Small sips/bites;Lingual sweep for clearance of pocketing;Multiple dry swallows after each bite/sip;Follow solids with liquid Postural Changes and/or Swallow Maneuvers: Seated upright 90 degrees;Upright 30-60 min after meal                General recommendations: (Dietician f/u) Oral Care Recommendations: Oral care BID;Staff/trained caregiver to provide oral care;Patient independent with oral care Follow up Recommendations: Skilled Nursing facility(to continue Rehab) SLP Visit Diagnosis: Dysphagia, oropharyngeal phase (R13.12) Plan: All goals met       GO                Orinda Kenner, MS, CCC-SLP Akira Adelsberger 01/11/2018, 9:23 AM

## 2018-01-11 NOTE — Plan of Care (Signed)
  Progressing Health Behavior/Discharge Planning: Ability to manage health-related needs will improve 01/11/2018 0009 - Progressing by Loran Senters, RN Safety: Ability to remain free from injury will improve 01/11/2018 0009 - Progressing by Loran Senters, RN Skin Integrity: Risk for impaired skin integrity will decrease 01/11/2018 0009 - Progressing by Loran Senters, RN

## 2018-01-11 NOTE — Discharge Instructions (Signed)
Aspiration and fall precaution. Diet recommendations: Dysphagia 3 (mechanical soft);Nectar-thick liquid(this is baseline during her Rehab) Liquids provided via: Cup;No straw Medication Administration: Whole meds with puree(but Crushed if needed for easier, safer swallowing) Supervision: Patient able to self feed;Intermittent supervision to cue for compensatory strategies(precautions; setup of meal) Compensations: Minimize environmental distractions;Slow rate;Small sips/bites;Lingual sweep for clearance of pocketing;Multiple dry swallows after each bite/sip;Follow solids with liquid Postural Changes and/or Swallow Maneuvers: Seated upright 90 degrees;Upright 30-60 min after meal General recommendations: (Dietician f/u) Oral Care Recommendations: Oral care BID;Staff/trained caregiver to provide oral care;Patient independent with oral care Follow up Recommendations: Skilled Nursing facility(to continue Rehab) SLP Visit Diagnosis: Dysphagia, oropharyngeal phase

## 2018-01-11 NOTE — Progress Notes (Signed)
Patient has an oral temp of 100.7.  Tylenol given.  Other VS WNL.  Dr. Bridgett Larsson notified.

## 2018-01-11 NOTE — Care Management (Signed)
Patient for discharge to skilled nursing facility and patient and family member at bedside defer decision as to whether patient should proceed with discharge to her son.  Left voicemail for patient's son on what apparently is the home phone number requesting call back asap.  Patient nor person in the room know son's phone number.

## 2018-01-11 NOTE — Clinical Social Work Note (Addendum)
CSW spoke with patient and a family friend who was in her room.  CSW informed patient she has a bed at [facility], patient requested that CSW speak with her son to discuss discharge plans.  SW attempted to contact patient's son Legrand Como [phone] left a message on home phone.  4:15pm CSW attempted to contact patient's son again, left another message awaiting for call back.  5:29pm  CSW was informed that patient has a fever now, discharge cancelled per MD.  Jones Broom. [place], MSW, [place]  01/11/2018 3:03 PM

## 2018-01-11 NOTE — Progress Notes (Signed)
Pharmacy consulted for electrolyte replacement protocol:   Patient w/ norovirus  Goal of therapy: Electrolytes within normal limits:  K 3.5 - 5.1 Corrected Ca 8.9 - 10.3 Phos 2.5 - 4.6 Mg 1.7 - 2.4   Assessment: Lab Results  Component Value Date   CREATININE 0.68 01/11/2018   BUN 19 01/11/2018   NA 138 01/11/2018   K 3.6 01/11/2018   CL 103 01/11/2018   CO2 29 01/11/2018   Lab Results  Component Value Date   K 3.6 01/11/2018   Magnesium (mg/dL)  Date Value  01/11/2018 1.6 (L)   Phosphorus (mg/dL)  Date Value  01/11/2018 2.7  ] PLAN: Magnesium 2g IV x 1 dose ordered. No other electrolyte replacement warranted at this time. Will recheck Mg with AM labs.    Pernell Dupre, PharmD, BCPS Clinical Pharmacist 01/11/2018 9:13 AM

## 2018-01-11 NOTE — Discharge Summary (Signed)
[place] at [place] NAME: Dawn [name]    MR#:  [id number]  DATE OF BIRTH:  1928-11-11  DATE OF ADMISSION:  01/03/2018   ADMITTING PHYSICIAN: Gorden Harms, MD  DATE OF DISCHARGE: 01/11/2018 PRIMARY CARE PHYSICIAN: Idelle Crouch, MD   ADMISSION DIAGNOSIS:  Dehydration [E86.0] Lower urinary tract infection [N39.0] Elevated lactic acid level [R79.89] Diarrhea, unspecified type [R19.7] DISCHARGE DIAGNOSIS:  Active Problems:   Sepsis ([place])   Pressure injury of skin  SECONDARY DIAGNOSIS:   Past Medical History:  Diagnosis Date  . Cancer ([place])    skin cancer  . 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 Hypertension   . Sciatica    HOSPITAL COURSE:   84 year old female with a history essential hypertension who presents from inpatient rehabilitation due to diarrhea and generalized weakness.  1. Sepsis: Patient presents with tachypnea and tachycardia as well as leukocytosis - Sepsis is due to UTI - Urine culture growing Providenicia.  - Treated with Rocephin X 5 days.  Afebrile, hemodynamically stable.   2. UTI: Continue Rocephin and Urine culture + for Providencia.   - Treated for 5 days with IV Rocephin.   3. Diarrhea with Norovirus - diarrhea much improved.  - cont. Probiotics.  Cont. Imodium prn. - started on Dysphagia III diet with nectar thick liquids and tolerating it and PO intake is improving.   4. Acute on chronic blood loss anemia: Anemia panel consistent with iron deficiency.  - Status post 2 units of packed blood cells transfusion. Hemoglobin improved and remains stable. Change to protonix PO twice a day. Cont. To Hold aspirin, Plavix. Seen by gastroenterology and no plans for acute intervention and continue supportive care for now. Seen by speech therapy and started on a dysphagia 3 diet with nectar thin liquids and tolerating it well.   5.  Hypokalemia: improved w/ supplementation.  Mg level is 1.6, given iv mag.  6. Protein calorie malnutrition, severe: started on Dysphagia III, nectar thick liquids. - PO intake improving.    7. History of COPD without exacerbation - cont. PRN duonebs, Pulmicort nebs.   8. PVD: Hold Plavix for now due to anemia - Continue atorvastatin  9. Essential hypertension: Continue Norvasc,clonidine  Her son chose [facility] nursing facility. The patient is medically stable, but still weak, needs PT in facility. Her son does not want her to be discharged today.  He wants the patient to stay in the hospital a few more days.  Post nurse and me explained to him but he insisted no discharge. DISCHARGE CONDITIONS:  Stable, discharged to skilled nursing facility today. CONSULTS OBTAINED:   DRUG ALLERGIES:   Allergies  Allergen Reactions  . Percocet [Oxycodone-Acetaminophen] Itching  . Percodan [Oxycodone-Aspirin] Itching   DISCHARGE MEDICATIONS:   Allergies as of 01/11/2018      Reactions   Percocet [oxycodone-acetaminophen] Itching   Percodan [oxycodone-aspirin] Itching      Medication List    STOP taking these medications   aspirin EC 81 MG tablet   clopidogrel 75 MG tablet Commonly known as:  PLAVIX   hydrochlorothiazide 25 MG tablet Commonly known as:  HYDRODIURIL   naproxen sodium 220 MG tablet Commonly known as:  ALEVE     TAKE these medications   acidophilus Caps capsule Take 1 capsule by mouth daily.   amLODipine 10 MG tablet Commonly known as:  NORVASC Take 1  tablet (10 mg total) by mouth daily.   atorvastatin 20 MG tablet Commonly known as:  LIPITOR TAKE 1 TABLET BY MOUTH ONCE DAILY AT  6PM   B-12 5000 MCG Caps Take 5,000 mcg by mouth daily.   cloNIDine 0.1 MG tablet Commonly known as:  CATAPRES Take 1 tablet (0.1 mg total) by mouth 2 (two) times daily. What changed:  when to take this   Gerhardt's butt cream Crea Apply 1 application topically  2 (two) times daily.   guaifenesin 100 MG/5ML syrup Commonly known as:  ROBITUSSIN Take 400 mg by mouth 3 (three) times daily as needed for cough.   indomethacin 50 MG capsule Commonly known as:  INDOCIN Take 50 mg by mouth 2 (two) times daily as needed (for gout flares).   ipratropium-albuterol 0.5-2.5 (3) MG/3ML Soln Commonly known as:  DUONEB Take 3 mLs by nebulization every 6 (six) hours as needed.   ketorolac 0.4 % Soln Commonly known as:  ACULAR Place 1 drop into the left eye 3 (three) times daily.   LEG-GESIC PO Take 0.5 tablets by mouth at bedtime. LEGATRIN PM   ondansetron 4 MG tablet Commonly known as:  ZOFRAN Take 1 tablet (4 mg total) by mouth every 6 (six) hours as needed for nausea.   pantoprazole 40 MG tablet Commonly known as:  PROTONIX Take 1 tablet (40 mg total) by mouth 2 (two) times daily before a meal.   traMADol 50 MG tablet Commonly known as:  ULTRAM Take 1 tablet (50 mg total) by mouth every 6 (six) hours as needed. What changed:    when to take this  reasons to take this   tretinoin 0.05 % cream Commonly known as:  RETIN-A Apply 1 application topically every other day. EVERY OTHER NIGHT   Vitamin D3 400 units Caps Take 400 Units by mouth daily.        DISCHARGE INSTRUCTIONS:  See AVS. If you experience worsening of your admission symptoms, develop shortness of breath, life threatening emergency, suicidal or homicidal thoughts you must seek medical attention immediately by calling 911 or calling your MD immediately  if symptoms less severe.  You Must read complete instructions/literature along with all the possible adverse reactions/side effects for all the Medicines you take and that have been prescribed to you. Take any new Medicines after you have completely understood and accpet all the possible adverse reactions/side effects.   Please note  You were cared for by a hospitalist during your hospital stay. If you have any questions  about your discharge medications or the care you received while you were in the hospital after you are discharged, you can call the unit and asked to speak with the hospitalist on call if the hospitalist that took care of you is not available. Once you are discharged, your primary care physician will handle any further medical issues. Please note that NO REFILLS for any discharge medications will be authorized once you are discharged, as it is imperative that you return to your primary care physician (or establish a relationship with a primary care physician if you do not have one) for your aftercare needs so that they can reassess your need for medications and monitor your lab values.    On the day of Discharge:  VITAL SIGNS:  Blood pressure (!) 144/85, pulse 90, temperature 98.4 F (36.9 C), temperature source Oral, resp. rate (!) 26, height 5\' 3"  (1.6 m), weight 288 lb (130.6 kg), SpO2 93 %. PHYSICAL EXAMINATION:  GENERAL:  84 y.o.-year-old patient lying in the bed with no acute distress.  Severe malnutrition. EYES: Pupils equal, round, reactive to light and accommodation. No scleral icterus. Extraocular muscles intact.  HEENT: Head atraumatic, normocephalic. Oropharynx and nasopharynx clear.  NECK:  Supple, no jugular venous distention. No thyroid enlargement, no tenderness.  LUNGS: Normal breath sounds bilaterally, no wheezing, rales,rhonchi or crepitation. No use of accessory muscles of respiration.  CARDIOVASCULAR: S1, S2 normal. No murmurs, rubs, or gallops.  ABDOMEN: Soft, non-tender, non-distended. Bowel sounds present. No organomegaly or mass.  EXTREMITIES: No pedal edema, cyanosis, or clubbing.  NEUROLOGIC: Cranial nerves II through XII are intact. Muscle strength 2/5 in all extremities. Sensation intact. Gait not checked.  PSYCHIATRIC: The patient is alert and oriented x 2.  SKIN: No obvious rash, lesion, or ulcer.  DATA REVIEW:   CBC Recent Labs  Lab 01/10/18 0536  WBC 7.7    HGB 10.8*  HCT 32.3*  PLT 246    Chemistries  Recent Labs  Lab 01/11/18 0453  NA 138  K 3.6  CL 103  CO2 29  GLUCOSE 90  BUN 19  CREATININE 0.68  CALCIUM 8.5*  MG 1.6*     Microbiology Results  Results for orders placed or performed during the hospital encounter of 01/03/18  Urine Culture     Status: Abnormal   Collection Time: 01/03/18 11:41 AM  Result Value Ref Range Status   Specimen Description   Final    URINE, RANDOM Performed at [facility], [address] [place], [place] [address]    Special Requests   Final    NONE Performed at [facility], [address] [place], [place] [address]    Culture >=100,000 COLONIES/mL PROVIDENCIA RETTGERI (A)  Final   Report Status 01/05/2018 FINAL  Final   Organism ID, Bacteria PROVIDENCIA RETTGERI (A)  Final      Susceptibility   Providencia rettgeri - MIC*    AMPICILLIN RESISTANT Resistant     CEFAZOLIN >=64 RESISTANT Resistant     CEFTRIAXONE <=1 SENSITIVE Sensitive     CIPROFLOXACIN <=0.25 SENSITIVE Sensitive     GENTAMICIN <=1 SENSITIVE Sensitive     IMIPENEM 4 SENSITIVE Sensitive     NITROFURANTOIN 256 RESISTANT Resistant     TRIMETH/SULFA <=20 SENSITIVE Sensitive     AMPICILLIN/SULBACTAM >=32 RESISTANT Resistant     PIP/TAZO <=4 SENSITIVE Sensitive     * >=100,000 COLONIES/mL PROVIDENCIA RETTGERI  Blood Culture (routine x 2)     Status: None   Collection Time: 01/03/18  1:00 PM  Result Value Ref Range Status   Specimen Description BLOOD RFOA  Final   Special Requests   Final    BOTTLES DRAWN AEROBIC AND ANAEROBIC Blood Culture adequate volume   Culture   Final    NO GROWTH 5 DAYS Performed at [facility], [address] [place],  [address]    Report Status 01/08/2018 FINAL  Final  Blood Culture (routine x 2)     Status: None   Collection Time: 01/03/18  1:00 PM  Result Value Ref Range Status   Specimen Description BLOOD RFOA  Final   Special Requests   Final     BOTTLES DRAWN AEROBIC AND ANAEROBIC Blood Culture adequate volume   Culture   Final    NO GROWTH 5 DAYS Performed at [facility], [address] [place],  [address]    Report Status 01/08/2018 FINAL  Final  Gastrointestinal Panel by PCR , Stool  Status: Abnormal   Collection Time: 01/03/18  3:27 PM  Result Value Ref Range Status   Campylobacter species NOT DETECTED NOT DETECTED Final   Plesimonas shigelloides NOT DETECTED NOT DETECTED Final   Salmonella species NOT DETECTED NOT DETECTED Final   Yersinia enterocolitica NOT DETECTED NOT DETECTED Final   Vibrio species NOT DETECTED NOT DETECTED Final   Vibrio cholerae NOT DETECTED NOT DETECTED Final   Enteroaggregative E coli (EAEC) NOT DETECTED NOT DETECTED Final   Enteropathogenic E coli (EPEC) NOT DETECTED NOT DETECTED Final   Enterotoxigenic E coli (ETEC) NOT DETECTED NOT DETECTED Final   Shiga like toxin producing E coli (STEC) NOT DETECTED NOT DETECTED Final   Shigella/Enteroinvasive E coli (EIEC) NOT DETECTED NOT DETECTED Final   Cryptosporidium NOT DETECTED NOT DETECTED Final   Cyclospora cayetanensis NOT DETECTED NOT DETECTED Final   Entamoeba histolytica NOT DETECTED NOT DETECTED Final   Giardia lamblia NOT DETECTED NOT DETECTED Final   Adenovirus F40/41 NOT DETECTED NOT DETECTED Final   Astrovirus NOT DETECTED NOT DETECTED Final   Norovirus GI/GII DETECTED (A) NOT DETECTED Final    Comment: RESULT CALLED TO, READ BACK BY AND VERIFIED WITH: JANCY JOHNSTONE AT 1828 ON 01/03/2018 JJB    Rotavirus A NOT DETECTED NOT DETECTED Final   Sapovirus (I, II, IV, and V) NOT DETECTED NOT DETECTED Final    Comment: Performed at [facility], [address] [place], [place] [address]  MRSA PCR Screening     Status: None   Collection Time: 01/03/18  6:39 PM  Result Value Ref Range Status   MRSA by PCR NEGATIVE NEGATIVE Final    Comment:        The GeneXpert MRSA Assay (FDA approved for NASAL  specimens only), is one component of a comprehensive MRSA colonization surveillance program. It is not intended to diagnose MRSA infection nor to guide or monitor treatment for MRSA infections. Performed at [facility], [address] [place], [place] [address]     RADIOLOGY:  No results found.   Management plans discussed with the patient, her son and they are in agreement.  CODE STATUS: Full Code   TOTAL TIME TAKING CARE OF THIS PATIENT: 46 minutes.    Demetrios Loll M.D on 01/11/2018 at 12:29 PM  Between 7am to 6pm - Pager - [phone]  After 6pm go to [email or web address] - Proofreader  [facility]  Office  [phone]  CC: Primary care physician; Idelle Crouch, MD   Note: This dictation was prepared with Dragon dictation along with smaller phrase technology. Any transcriptional errors that result from this process are unintentional.

## 2018-01-12 ENCOUNTER — Inpatient Hospital Stay: Payer: Medicare Other

## 2018-01-12 LAB — POTASSIUM: Potassium: 3.3 mmol/L — ABNORMAL LOW (ref 3.5–5.1)

## 2018-01-12 LAB — MAGNESIUM: Magnesium: 2.1 mg/dL (ref 1.7–2.4)

## 2018-01-12 LAB — GLUCOSE, CAPILLARY: Glucose-Capillary: 80 mg/dL (ref 65–99)

## 2018-01-12 MED ORDER — CIPROFLOXACIN HCL 500 MG PO TABS
500.0000 mg | ORAL_TABLET | Freq: Two times a day (BID) | ORAL | Status: DC
  Administered 2018-01-12: 500 mg via ORAL
  Filled 2018-01-12: qty 1

## 2018-01-12 MED ORDER — SENNOSIDES-DOCUSATE SODIUM 8.6-50 MG PO TABS
1.0000 | ORAL_TABLET | Freq: Two times a day (BID) | ORAL | Status: DC
  Administered 2018-01-12 – 2018-01-14 (×5): 1 via ORAL
  Filled 2018-01-12 (×5): qty 1

## 2018-01-12 MED ORDER — PIPERACILLIN-TAZOBACTAM 3.375 G IVPB
3.3750 g | Freq: Three times a day (TID) | INTRAVENOUS | Status: DC
  Administered 2018-01-12 – 2018-01-14 (×6): 3.375 g via INTRAVENOUS
  Filled 2018-01-12 (×6): qty 50

## 2018-01-12 MED ORDER — POTASSIUM CHLORIDE CRYS ER 20 MEQ PO TBCR
40.0000 meq | EXTENDED_RELEASE_TABLET | Freq: Once | ORAL | Status: AC
  Administered 2018-01-12: 40 meq via ORAL
  Filled 2018-01-12: qty 2

## 2018-01-12 MED ORDER — POLYETHYLENE GLYCOL 3350 17 G PO PACK: 17.0000 g | PACK | Freq: Every day | ORAL | Status: DC

## 2018-01-12 NOTE — Progress Notes (Signed)
ANTIBIOTIC CONSULT NOTE - INITIAL  Pharmacy Consult for Zosyn Indication: sepsis  Allergies  Allergen Reactions  . Percocet [Oxycodone-Acetaminophen] Itching  . Percodan [Oxycodone-Aspirin] Itching    Patient Measurements: Height: 5\' 3"  (160 cm) Weight: 288 lb (130.6 kg) IBW/kg (Calculated) : 52.4 Adjusted Body Weight:   Vital Signs: Temp: 97.6 F (36.4 C) (02/16 0813) Temp Source: Oral (02/16 0813) BP: 149/48 (02/16 0813) Pulse Rate: 81 (02/16 0813) Intake/Output from previous day: 02/15 0701 - 02/16 0700 In: -  Out: 350 [Urine:350] Intake/Output from this shift: No intake/output data recorded.  Labs: Recent Labs    01/10/18 0536 01/11/18 0453  WBC 7.7  --   HGB 10.8*  --   PLT 246  --   CREATININE  --  0.68   Estimated Creatinine Clearance: 65.5 mL/min (by C-G formula based on SCr of 0.68 mg/dL). No results for input(s): VANCOTROUGH, VANCOPEAK, VANCORANDOM, GENTTROUGH, GENTPEAK, GENTRANDOM, TOBRATROUGH, TOBRAPEAK, TOBRARND, AMIKACINPEAK, AMIKACINTROU, AMIKACIN in the last 72 hours.   Microbiology: Recent Results (from the past 720 hour(s))  Urine Culture     Status: Abnormal   Collection Time: 01/03/18 11:41 AM  Result Value Ref Range Status   Specimen Description   Final    URINE, RANDOM Performed at [facility], [address], [place], [place] [address]    Special Requests   Final    NONE Performed at [facility], [address] [place], [place] [address]    Culture >=100,000 COLONIES/mL PROVIDENCIA RETTGERI (A)  Final   Report Status 01/05/2018 FINAL  Final   Organism ID, Bacteria PROVIDENCIA RETTGERI (A)  Final      Susceptibility   Providencia rettgeri - MIC*    AMPICILLIN RESISTANT Resistant     CEFAZOLIN >=64 RESISTANT Resistant     CEFTRIAXONE <=1 SENSITIVE Sensitive     CIPROFLOXACIN <=0.25 SENSITIVE Sensitive     GENTAMICIN <=1 SENSITIVE Sensitive     IMIPENEM 4 SENSITIVE Sensitive     NITROFURANTOIN 256 RESISTANT  Resistant     TRIMETH/SULFA <=20 SENSITIVE Sensitive     AMPICILLIN/SULBACTAM >=32 RESISTANT Resistant     PIP/TAZO <=4 SENSITIVE Sensitive     * >=100,000 COLONIES/mL PROVIDENCIA RETTGERI  Blood Culture (routine x 2)     Status: None   Collection Time: 01/03/18  1:00 PM  Result Value Ref Range Status   Specimen Description BLOOD RFOA  Final   Special Requests   Final    BOTTLES DRAWN AEROBIC AND ANAEROBIC Blood Culture adequate volume   Culture   Final    NO GROWTH 5 DAYS Performed at [facility], [address] [place], [place] [address]    Report Status 01/08/2018 FINAL  Final  Blood Culture (routine x 2)     Status: None   Collection Time: 01/03/18  1:00 PM  Result Value Ref Range Status   Specimen Description BLOOD RFOA  Final   Special Requests   Final    BOTTLES DRAWN AEROBIC AND ANAEROBIC Blood Culture adequate volume   Culture   Final    NO GROWTH 5 DAYS Performed at [facility], [address] [place], [place] [address]    Report Status 01/08/2018 FINAL  Final  Gastrointestinal Panel by PCR , Stool     Status: Abnormal   Collection Time: 01/03/18  3:27 PM  Result Value Ref Range Status   Campylobacter species NOT DETECTED NOT DETECTED Final   Plesimonas shigelloides NOT DETECTED NOT DETECTED Final   Salmonella  species NOT DETECTED NOT DETECTED Final   Yersinia enterocolitica NOT DETECTED NOT DETECTED Final   Vibrio species NOT DETECTED NOT DETECTED Final   Vibrio cholerae NOT DETECTED NOT DETECTED Final   Enteroaggregative E coli (EAEC) NOT DETECTED NOT DETECTED Final   Enteropathogenic E coli (EPEC) NOT DETECTED NOT DETECTED Final   Enterotoxigenic E coli (ETEC) NOT DETECTED NOT DETECTED Final   Shiga like toxin producing E coli (STEC) NOT DETECTED NOT DETECTED Final   Shigella/Enteroinvasive E coli (EIEC) NOT DETECTED NOT DETECTED Final   Cryptosporidium NOT DETECTED NOT DETECTED Final   Cyclospora cayetanensis NOT DETECTED NOT DETECTED  Final   Entamoeba histolytica NOT DETECTED NOT DETECTED Final   Giardia lamblia NOT DETECTED NOT DETECTED Final   Adenovirus F40/41 NOT DETECTED NOT DETECTED Final   Astrovirus NOT DETECTED NOT DETECTED Final   Norovirus GI/GII DETECTED (A) NOT DETECTED Final    Comment: RESULT CALLED TO, READ BACK BY AND VERIFIED WITH: JANCY JOHNSTONE AT 1610 ON 01/03/2018 JJB    Rotavirus A NOT DETECTED NOT DETECTED Final   Sapovirus (I, II, IV, and V) NOT DETECTED NOT DETECTED Final    Comment: Performed at [facility], [address] [place], [place] [address]  MRSA PCR Screening     Status: None   Collection Time: 01/03/18  6:39 PM  Result Value Ref Range Status   MRSA by PCR NEGATIVE NEGATIVE Final    Comment:        The GeneXpert MRSA Assay (FDA approved for NASAL specimens only), is one component of a comprehensive MRSA colonization surveillance program. It is not intended to diagnose MRSA infection nor to guide or monitor treatment for MRSA infections. Performed at [facility], [address], [place], [place] [address]     Medical History: Past Medical History:  Diagnosis Date  . Cancer ([place])    skin cancer  . 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 Hypertension   . Sciatica     Medications:  Medications Prior to Admission  Medication Sig Dispense Refill Last Dose  . acidophilus (RISAQUAD) CAPS capsule Take 1 capsule by mouth daily. 30 capsule 3 01/03/2018 at Unknown time  . amLODipine (NORVASC) 10 MG tablet Take 1 tablet (10 mg total) by mouth daily. 30 tablet 3 01/03/2018 at Unknown time  . aspirin EC 81 MG tablet Take 81 mg by mouth daily.   01/03/2018 at Unknown time  . atorvastatin (LIPITOR) 20 MG tablet TAKE 1 TABLET BY MOUTH ONCE DAILY AT  6PM 30 tablet 3 01/03/2018 at Unknown time  . Cholecalciferol (VITAMIN D3) 400 units CAPS Take 400 Units by mouth daily.   01/03/2018 at Unknown time  . cloNIDine  (CATAPRES) 0.1 MG tablet Take 1 tablet (0.1 mg total) by mouth 2 (two) times daily. (Patient taking differently: Take 0.1 mg by mouth 3 (three) times daily. ) 60 tablet 0 01/03/2018 at Unknown time  . clopidogrel (PLAVIX) 75 MG tablet TAKE 1 TABLET BY MOUTH ONCE DAILY 30 tablet 3 01/03/2018 at Unknown time  . Cyanocobalamin (B-12) 5000 MCG CAPS Take 5,000 mcg by mouth daily.    01/03/2018 at Unknown time  . guaifenesin (ROBITUSSIN) 100 MG/5ML syrup Take 400 mg by mouth 3 (three) times daily as needed for cough.   01/03/2018 at Unknown time  . hydrochlorothiazide (HYDRODIURIL) 25 MG tablet Take 25 mg by mouth daily.    01/03/2018 at Unknown time  .  ketorolac (ACULAR) 0.4 % SOLN Place 1 drop into the left eye 3 (three) times daily.    01/03/2018 at Unknown time  . Multiple Minerals-Vitamins (LEG-GESIC PO) Take 0.5 tablets by mouth at bedtime. LEGATRIN PM    01/03/2018 at Unknown time  . naproxen sodium (ANAPROX) 220 MG tablet Take 440 mg by mouth daily as needed (PAIN).    01/03/2018 at Unknown time  . indomethacin (INDOCIN) 50 MG capsule Take 50 mg by mouth 2 (two) times daily as needed (for gout flares).   PRN at PRN  . tretinoin (RETIN-A) 0.05 % cream Apply 1 application topically every other day. EVERY OTHER NIGHT   PRN at PRN   Scheduled:  . acidophilus  1 capsule Oral Daily  . amLODipine  10 mg Oral Daily  . atorvastatin  20 mg Oral q1800  . budesonide (PULMICORT) nebulizer solution  0.5 mg Nebulization BID  . chlorhexidine  15 mL Mouth Rinse BID  . cholecalciferol  400 Units Oral Daily  . cloNIDine  0.1 mg Oral TID  . Gerhardt's butt cream   Topical BID  . ketorolac  1 drop Left Eye TID  . mouth rinse  15 mL Mouth Rinse q12n4p  . multivitamin with minerals  1 tablet Oral QHS  . multivitamin-lutein  1 capsule Oral Daily  . pantoprazole  40 mg Oral BID AC  . senna-docusate  1 tablet Oral BID  . sodium chloride flush  3 mL Intravenous Q12H  . sodium chloride flush  3 mL Intravenous Q12H  . vitamin B-12   1,000 mcg Oral Daily   Assessment: Pharmacy consulted to dose and monitor Zosyn in this 84 year old woman being treated for sepsis   Goal of Therapy:    Plan:  Will start Zosyn 3.375 g IV q8 hours.   Dawn [name] 01/12/2018,10:36 AM

## 2018-01-12 NOTE — Progress Notes (Signed)
Pharmacy consulted for electrolyte replacement protocol:   Patient w/ norovirus  Goal of therapy: Electrolytes within normal limits:  K 3.5 - 5.1 Corrected Ca 8.9 - 10.3 Phos 2.5 - 4.6 Mg 1.7 - 2.4   Assessment: Lab Results  Component Value Date   CREATININE 0.68 01/11/2018   BUN 19 01/11/2018   NA 138 01/11/2018   K 3.3 (L) 01/12/2018   CL 103 01/11/2018   CO2 29 01/11/2018   Lab Results  Component Value Date   K 3.3 (L) 01/12/2018   Magnesium (mg/dL)  Date Value  01/12/2018 2.1   Phosphorus (mg/dL)  Date Value  01/11/2018 2.7   PLAN: K = 3.3, KCl 40 mEq PO once has already been ordered by MD. No additional supplementation needed at this time.   Lenis Noon, PharmD, BCPS Clinical Pharmacist 01/12/2018 8:30 AM

## 2018-01-12 NOTE — Progress Notes (Signed)
Patient ID: Dawn [name], female   DOB: 1928/02/19, 84 y.o.   MRN: [id number]  ACP note.  Patient and son at the bedside.  CODE STATUS discussed at length.  Patient and son would like the patient to be full code.  Son also mentioned that he does not want [place] to break ribs.  I explained that this happens in order to get the heart started back again.  He was okay with CPR at this time.  Diagnosis: severe protein calorie malnutrition.  Pneumonia that developed in the hospital.  Sepsis secondary to urinary tract infection.  Patient is on dysphagia diet.  Immobility.  Severe peripheral vascular disease and anemia requiring transfusion.  Weakness.  I explained to the patient and family that we need to eat better and walk around in order to prevent complications.  Patient already developed a complication of pneumonia.  Time spent on ACP discussion 17 minutes  Dr. Loletha Grayer

## 2018-01-12 NOTE — Progress Notes (Signed)
Patient ID: Dawn [name], female   DOB: 1928-03-28, 84 y.o.   MRN: [id number]  [employer] PROGRESS NOTE  Dawn [name] HFW:[id number] DOB: 08-26-1928 DOA: 01/03/2018 PCP: Idelle Crouch, MD  HPI/Subjective: Patient lying in bed.  Patient had a fever last night.  Some cough.  Some shortness of breath.  As per the son, she is eating a little bit better.  Objective: Vitals:   01/12/18 0443 01/12/18 0813  BP: (!) 153/49 (!) 149/48  Pulse: 91 81  Resp: 16 17  Temp: 98.6 F (37 C) 97.6 F (36.4 C)  SpO2: 90% 97%    Filed Weights   01/09/18 0426 01/10/18 0423 01/11/18 0527  Weight: 101.6 kg (224 lb) 112.9 kg (249 lb) 130.6 kg (288 lb)    ROS: Review of Systems  Constitutional: Negative for chills and fever.  Eyes: Negative for blurred vision.  Respiratory: Positive for cough and shortness of breath.   Cardiovascular: Negative for chest pain.  Gastrointestinal: Negative for abdominal pain, constipation, diarrhea, nausea and vomiting.  Genitourinary: Negative for dysuria.  Musculoskeletal: Negative for joint pain.  Neurological: Negative for dizziness and headaches.   Exam: Physical Exam  HENT:  Nose: No mucosal edema.  Mouth/Throat: No oropharyngeal exudate or posterior oropharyngeal edema.  Eyes: Conjunctivae, EOM and lids are normal. Pupils are equal, round, and reactive to light.  Neck: No JVD present. Carotid bruit is not present. No edema present. No thyroid mass and no thyromegaly present.  Cardiovascular: S1 normal and S2 normal. Exam reveals no gallop.  No murmur heard. Pulses:      Dorsalis pedis pulses are 0 on the right side, and 0 on the left side.  Respiratory: No respiratory distress. She has decreased breath sounds in the right lower field and the left lower field. She has no wheezes. She has rhonchi in the right lower field and the left lower field. She has no rales.  GI: Soft. Bowel sounds are normal. There is no tenderness.   Musculoskeletal:       Right ankle: She exhibits no swelling.       Left ankle: She exhibits no swelling.  Lymphadenopathy:    She has no cervical adenopathy.  Neurological: She is alert. No cranial nerve deficit.  Skin: Skin is warm. Nails show no clubbing.  As per nursing staff, stage II decubiti on buttock.  Chronic lower extremity discoloration bilateral lower extremities.  Psychiatric: She has a normal mood and affect.      Data Reviewed: Basic Metabolic Panel: Recent Labs  Lab 01/06/18 0558 01/07/18 0356 01/08/18 0546 01/09/18 0952 01/11/18 0453 01/12/18 0506  NA 144 140 136 138 138  --   K 3.7 3.0* 3.6 3.6 3.6 3.3*  CL 116* 110 106 107 103  --   CO2 20* 20* 22 24 29   --   GLUCOSE 88 97 102* 111* 90  --   BUN 34* 23* 19 19 19   --   CREATININE 0.94 0.71 0.69 0.78 0.68  --   CALCIUM 7.9* 7.8* 7.5* 7.6* 8.5*  --   MG  --  1.7 2.3 1.8 1.6* 2.1  PHOS  --   --   --   --  2.7  --    CBC: Recent Labs  Lab 01/06/18 0558 01/10/18 0536  WBC 12.3* 7.7  HGB 10.2* 10.8*  HCT 31.0* 32.3*  MCV 84.9 85.4  PLT 256 246    CBG: Recent Labs  Lab 01/10/18 0754 01/10/18 1143 01/12/18  1610  RUEAVW 09 81 19    Recent Results (from the past 240 hour(s))  Urine Culture     Status: Abnormal   Collection Time: 01/03/18 11:41 AM  Result Value Ref Range Status   Specimen Description   Final    URINE, RANDOM Performed at [facility], [address] [place], [place] [address]    Special Requests   Final    NONE Performed at [facility], [address] [place], [place] [address]    Culture >=100,000 COLONIES/mL PROVIDENCIA RETTGERI (A)  Final   Report Status 01/05/2018 FINAL  Final   Organism ID, Bacteria PROVIDENCIA RETTGERI (A)  Final      Susceptibility   Providencia rettgeri - MIC*    AMPICILLIN RESISTANT Resistant     CEFAZOLIN >=64 RESISTANT Resistant     CEFTRIAXONE <=1 SENSITIVE Sensitive     CIPROFLOXACIN <=0.25 SENSITIVE Sensitive      GENTAMICIN <=1 SENSITIVE Sensitive     IMIPENEM 4 SENSITIVE Sensitive     NITROFURANTOIN 256 RESISTANT Resistant     TRIMETH/SULFA <=20 SENSITIVE Sensitive     AMPICILLIN/SULBACTAM >=32 RESISTANT Resistant     PIP/TAZO <=4 SENSITIVE Sensitive     * >=100,000 COLONIES/mL PROVIDENCIA RETTGERI  Blood Culture (routine x 2)     Status: None   Collection Time: 01/03/18  1:00 PM  Result Value Ref Range Status   Specimen Description BLOOD RFOA  Final   Special Requests   Final    BOTTLES DRAWN AEROBIC AND ANAEROBIC Blood Culture adequate volume   Culture   Final    NO GROWTH 5 DAYS Performed at [facility], [address] [place], [place] [address]    Report Status 01/08/2018 FINAL  Final  Blood Culture (routine x 2)     Status: None   Collection Time: 01/03/18  1:00 PM  Result Value Ref Range Status   Specimen Description BLOOD RFOA  Final   Special Requests   Final    BOTTLES DRAWN AEROBIC AND ANAEROBIC Blood Culture adequate volume   Culture   Final    NO GROWTH 5 DAYS Performed at [facility], [address] [place], [place] [address]    Report Status 01/08/2018 FINAL  Final  Gastrointestinal Panel by PCR , Stool     Status: Abnormal   Collection Time: 01/03/18  3:27 PM  Result Value Ref Range Status   Campylobacter species NOT DETECTED NOT DETECTED Final   Plesimonas shigelloides NOT DETECTED NOT DETECTED Final   Salmonella species NOT DETECTED NOT DETECTED Final   Yersinia enterocolitica NOT DETECTED NOT DETECTED Final   Vibrio species NOT DETECTED NOT DETECTED Final   Vibrio cholerae NOT DETECTED NOT DETECTED Final   Enteroaggregative E coli (EAEC) NOT DETECTED NOT DETECTED Final   Enteropathogenic E coli (EPEC) NOT DETECTED NOT DETECTED Final   Enterotoxigenic E coli (ETEC) NOT DETECTED NOT DETECTED Final   Shiga like toxin producing E coli (STEC) NOT DETECTED NOT DETECTED Final   Shigella/Enteroinvasive E coli (EIEC) NOT DETECTED NOT DETECTED Final    Cryptosporidium NOT DETECTED NOT DETECTED Final   Cyclospora cayetanensis NOT DETECTED NOT DETECTED Final   Entamoeba histolytica NOT DETECTED NOT DETECTED Final   Giardia lamblia NOT DETECTED NOT DETECTED Final   Adenovirus F40/41 NOT DETECTED NOT DETECTED Final   Astrovirus NOT DETECTED NOT DETECTED Final   Norovirus GI/GII DETECTED (A) NOT DETECTED Final    Comment: RESULT CALLED TO, READ BACK BY  AND VERIFIED WITH: Lennart Pall AT 1937 ON 01/03/2018 JJB    Rotavirus A NOT DETECTED NOT DETECTED Final   Sapovirus (I, II, IV, and V) NOT DETECTED NOT DETECTED Final    Comment: Performed at [facility], [address] [place], [place] [address]  MRSA PCR Screening     Status: None   Collection Time: 01/03/18  6:39 PM  Result Value Ref Range Status   MRSA by PCR NEGATIVE NEGATIVE Final    Comment:        The GeneXpert MRSA Assay (FDA approved for NASAL specimens only), is one component of a comprehensive MRSA colonization surveillance program. It is not intended to diagnose MRSA infection nor to guide or monitor treatment for MRSA infections. Performed at [facility], [address] [place], [place] [address]      Studies: Dg Chest [facility] 1 View  Result Date: 01/12/2018 CLINICAL DATA:  Fever, diarrhea, weakness and altered mental status. EXAM: PORTABLE CHEST 1 VIEW COMPARISON:  CT chest and chest radiograph 01/03/2018. FINDINGS: Trachea is midline. Heart size normal. Thoracic aorta is calcified. Mild bibasilar hazy airspace opacification. There may be tiny bilateral pleural effusions. Osteopenia.  Degenerative changes in the shoulders. IMPRESSION: 1. Increasing hazy opacification of the lung bases may be due to evolving pneumonia. 2. Small bilateral pleural effusions. 3.  Aortic atherosclerosis (ICD10-170.0). Electronically Signed   By: Lorin Picket M.D.   On: 01/12/2018 08:19    Scheduled Meds: . acidophilus  1 capsule Oral Daily  . amLODipine  10 mg  Oral Daily  . atorvastatin  20 mg Oral q1800  . budesonide (PULMICORT) nebulizer solution  0.5 mg Nebulization BID  . chlorhexidine  15 mL Mouth Rinse BID  . cholecalciferol  400 Units Oral Daily  . cloNIDine  0.1 mg Oral TID  . Gerhardt's butt cream   Topical BID  . ketorolac  1 drop Left Eye TID  . mouth rinse  15 mL Mouth Rinse q12n4p  . multivitamin with minerals  1 tablet Oral QHS  . multivitamin-lutein  1 capsule Oral Daily  . pantoprazole  40 mg Oral BID AC  . senna-docusate  1 tablet Oral BID  . sodium chloride flush  3 mL Intravenous Q12H  . sodium chloride flush  3 mL Intravenous Q12H  . vitamin B-12  1,000 mcg Oral Daily   Continuous Infusions: . piperacillin-tazobactam (ZOSYN)  IV 3.375 g (01/12/18 1144)  . sodium chloride     And  . sodium chloride      Assessment/Plan:  1. Pneumonia that developed in the hospital.  Start Zosyn.  Patient had a fever last night and chest x-ray done this morning shows early pneumonia. 2. Clinical sepsis on presentation secondary to urinary tract infection growing Providencia.  Patient received 5 days of Rocephin in the hospital and completed the course. 3. Diarrhea with neurovirus 4. Difficulty with swallowing and dysphagia 3 diet with nectar thickened liquids 5. Acute on chronic blood loss anemia.  Patient received 2 units of packed red blood cells during the hospital course.  Patient was held on aspirin and Plavix. 6. Peripheral vascular disease.  Patient has had numerous procedures of her lower extremities. 7. Hypokalemia and hypomagnesemia. repleted during the hospital course. 8. Protein calorie malnutrition which is severe. 9. History of COPD 10. Essential hypertension continue usual medications 11. Weakness.  Patient will go back to [facility] potentially tomorrow or Monday depending on clinical course  Code Status:  Code Status Orders  (From admission, onward)        Start     Ordered   01/03/18 1521  Full code   Continuous     01/03/18 1521    Code Status History    Date Active Date Inactive Code Status Order ID Comments User Context   11/09/2017 11:31 11/22/2017 19:13 Full Code [id number]  Leonides Schanz Inpatient   11/09/2017 10:19 11/09/2017 11:22 Full Code [id number]  Leonides Schanz Outpatient   11/09/2017 10:19 11/09/2017 10:19 Full Code [id number]  Algernon Huxley, MD Outpatient   07/12/2017 10:05 07/12/2017 16:56 Full Code [id number]  Algernon Huxley, MD Inpatient   06/21/2017 11:25 06/21/2017 16:22 Full Code [id number]  Algernon Huxley, MD Inpatient   04/18/2017 11:50 04/18/2017 13:02 Full Code [id number]  Algernon Huxley, MD Inpatient   04/18/2017 11:04 04/18/2017 11:50 Full Code [id number]  Algernon Huxley, MD Inpatient     Family Communication:  spoke with son at the bedside Disposition Plan: [facility] potentially tomorrow versus Monday  Antibiotics:  Zosyn  Time spent: 40 minutes including ACP time  [employer]

## 2018-01-12 NOTE — Progress Notes (Signed)
Pt. Slept well throughout the night with no c/o SOB, acute distress, or pain.

## 2018-01-13 LAB — POTASSIUM: Potassium: 4.2 mmol/L (ref 3.5–5.1)

## 2018-01-13 MED ORDER — ASPIRIN EC 81 MG PO TBEC
81.0000 mg | DELAYED_RELEASE_TABLET | Freq: Every day | ORAL | Status: DC
  Administered 2018-01-13 – 2018-01-14 (×2): 81 mg via ORAL
  Filled 2018-01-13: qty 1

## 2018-01-13 NOTE — Progress Notes (Signed)
Pharmacy consulted for electrolyte replacement protocol:   Patient w/ norovirus  Goal of therapy: Electrolytes within normal limits: K 3.5 - 5.1 Corrected Ca 8.9 - 10.3 Phos 2.5 - 4.6 Mg 1.7 - 2.4   Assessment: Lab Results  Component Value Date   CREATININE 0.68 01/11/2018   BUN 19 01/11/2018   NA 138 01/11/2018   K 4.2 01/13/2018   CL 103 01/11/2018   CO2 29 01/11/2018   Lab Results  Component Value Date   K 4.2 01/13/2018   Magnesium (mg/dL)  Date Value  01/12/2018 2.1   Phosphorus (mg/dL)  Date Value  01/11/2018 2.7   PLAN: K = 4.2 WNL today. No supplementation needed. Will recheck electrolytes with AM labs on 2/19.  Lenis Noon, PharmD, BCPS Clinical Pharmacist 01/13/2018 7:15 AM

## 2018-01-13 NOTE — Progress Notes (Signed)
Pt. Weight is inaccurate, will have bed re-zeroed today when she gets up into recliner.

## 2018-01-13 NOTE — Progress Notes (Signed)
Pt. Slept well throughout the night, sats ran low around 88 to 89%, applied 2LNC and sats increased to 95%.

## 2018-01-13 NOTE — Progress Notes (Signed)
Patient ID: Dawn [name], female   DOB: [birth year], 84 y.o.   MRN: [id number]   [facility] PROGRESS NOTE  Dawn [name] KNL:[id number] DOB: [birth year] DOA: 01/03/2018 PCP: Dawn Crouch, MD  HPI/Subjective: Patient still feeling very weak.  A little bit this morning.  Some cough more productive than yesterday.  Objective: Vitals:   01/13/18 0341 01/13/18 0806  BP: (!) 134/52 (!) 136/51  Pulse: 93 91  Resp: 18 17  Temp: 97.7 F (36.5 C) (!) 97.4 F (36.3 C)  SpO2: 90% 100%    Filed Weights   01/10/18 0423 01/11/18 0527 01/13/18 0341  Weight: 112.9 kg (249 lb) 130.6 kg (288 lb) 126.5 kg (278 lb 12.8 oz)    ROS: Review of Systems  Constitutional: Negative for chills and fever.  Eyes: Negative for blurred vision.  Respiratory: Positive for cough and shortness of breath.   Cardiovascular: Negative for chest pain.  Gastrointestinal: Negative for abdominal pain, constipation, diarrhea, nausea and vomiting.  Genitourinary: Negative for dysuria.  Musculoskeletal: Negative for joint pain.  Neurological: Negative for dizziness and headaches.   Exam: Physical Exam  HENT:  Nose: No mucosal edema.  Mouth/Throat: No oropharyngeal exudate or posterior oropharyngeal edema.  Eyes: Conjunctivae, EOM and lids are normal. Pupils are equal, round, and reactive to light.  Neck: No JVD present. Carotid bruit is not present. No edema present. No thyroid mass and no thyromegaly present.  Cardiovascular: S1 normal and S2 normal. Exam reveals no gallop.  No murmur heard. Pulses:      Dorsalis pedis pulses are 0 on the right side, and 0 on the left side.  Respiratory: No respiratory distress. She has decreased breath sounds in the right lower field and the left lower field. She has no wheezes. She has rhonchi in the right lower field and the left lower field. She has no rales.  GI: Soft. Bowel sounds are normal. There is no tenderness.  Musculoskeletal:       Right  ankle: She exhibits no swelling.       Left ankle: She exhibits no swelling.  Lymphadenopathy:    She has no cervical adenopathy.  Neurological: She is alert. No cranial nerve deficit.  Skin: Skin is warm. Nails show no clubbing.  As per nursing staff, stage II decubiti on buttock.  Chronic lower extremity discoloration bilateral lower extremities.  Psychiatric: She has a normal mood and affect.      Data Reviewed: Basic Metabolic Panel: Recent Labs  Lab 01/07/18 0356 01/08/18 0546 01/09/18 0952 01/11/18 0453 01/12/18 0506 01/13/18 0446  NA 140 136 138 138  --   --   K 3.0* 3.6 3.6 3.6 3.3* 4.2  CL 110 106 107 103  --   --   CO2 20* 22 24 29   --   --   GLUCOSE 97 102* 111* 90  --   --   BUN 23* 19 19 19   --   --   CREATININE 0.71 0.69 0.78 0.68  --   --   CALCIUM 7.8* 7.5* 7.6* 8.5*  --   --   MG 1.7 2.3 1.8 1.6* 2.1  --   PHOS  --   --   --  2.7  --   --    CBC: Recent Labs  Lab 01/10/18 0536  WBC 7.7  HGB 10.8*  HCT 32.3*  MCV 85.4  PLT 246    CBG: Recent Labs  Lab 01/10/18 0754 01/10/18 1143 01/12/18 7902  GLUCAP 73 86 80    Recent Results (from the past 240 hour(s))  MRSA PCR Screening     Status: None   Collection Time: 01/03/18  6:39 PM  Result Value Ref Range Status   MRSA by PCR NEGATIVE NEGATIVE Final    Comment:        The GeneXpert MRSA Assay (FDA approved for NASAL specimens only), is one component of a comprehensive MRSA colonization surveillance program. It is not intended to diagnose MRSA infection nor to guide or monitor treatment for MRSA infections. Performed at [facility], [address] [place], [place] [address]      Studies: Dg Chest [facility] 1 View  Result Date: 01/12/2018 CLINICAL DATA:  Fever, diarrhea, weakness and altered mental status. EXAM: PORTABLE CHEST 1 VIEW COMPARISON:  CT chest and chest radiograph 01/03/2018. FINDINGS: Trachea is midline. Heart size normal. Thoracic aorta is calcified. Mild  bibasilar hazy airspace opacification. There may be tiny bilateral pleural effusions. Osteopenia.  Degenerative changes in the shoulders. IMPRESSION: 1. Increasing hazy opacification of the lung bases may be due to evolving pneumonia. 2. Small bilateral pleural effusions. 3.  Aortic atherosclerosis (ICD10-170.0). Electronically Signed   By: Lorin Picket M.D.   On: 01/12/2018 08:19    Scheduled Meds: . acidophilus  1 capsule Oral Daily  . amLODipine  10 mg Oral Daily  . atorvastatin  20 mg Oral q1800  . budesonide (PULMICORT) nebulizer solution  0.5 mg Nebulization BID  . chlorhexidine  15 mL Mouth Rinse BID  . cholecalciferol  400 Units Oral Daily  . cloNIDine  0.1 mg Oral TID  . Gerhardt's butt cream   Topical BID  . ketorolac  1 drop Left Eye TID  . mouth rinse  15 mL Mouth Rinse q12n4p  . multivitamin with minerals  1 tablet Oral QHS  . multivitamin-lutein  1 capsule Oral Daily  . pantoprazole  40 mg Oral BID AC  . senna-docusate  1 tablet Oral BID  . sodium chloride flush  3 mL Intravenous Q12H  . sodium chloride flush  3 mL Intravenous Q12H  . vitamin B-12  1,000 mcg Oral Daily   Continuous Infusions: . piperacillin-tazobactam (ZOSYN)  IV 3.375 g (01/13/18 1126)  . sodium chloride     And  . sodium chloride      Assessment/Plan:  1. Pneumonia that developed in the hospital.   continue Zosyn.  Likely will switch over to Augmentin for tomorrow. 2. Clinical sepsis on presentation secondary to urinary tract infection growing [place]id[place].  Patient received 5 days of Rocephin in the hospital and completed the course. 3. Diarrhea with norovirus 4. Difficulty with swallowing and dysphagia 3 diet with nectar thickened liquids 5. Acute on chronic blood loss anemia.  Patient received 2 units of packed red blood cells during the hospital course.  Patient was held on aspirin and Plavix. 6. Peripheral vascular disease.  Patient has had numerous procedures of her lower extremities.  Try  to restart aspirin.  Follow-up hemoglobin at facility closely. 7. Hypokalemia and hypomagnesemia. repleted during the hospital course. 8. Protein calorie malnutrition which is severe. 9. History of COPD 10. Essential hypertension continue usual medications 11. Weakness.  Hopefully can go to [facility] on Monday  Code Status:     Code Status Orders  (From admission, onward)        Start     Ordered   01/03/18 1521  Full code  Continuous     01/03/18 1521  Code Status History    Date Active Date Inactive Code Status Order ID Comments User Context   11/09/2017 11:31 11/22/2017 19:13 Full Code [id number]  Leonides Schanz Inpatient   11/09/2017 10:19 11/09/2017 11:22 Full Code [id number]  Leonides Schanz Outpatient   11/09/2017 10:19 11/09/2017 10:19 Full Code [id number]  Algernon Huxley, MD Outpatient   07/12/2017 10:05 07/12/2017 16:56 Full Code [id number]  Algernon Huxley, MD Inpatient   06/21/2017 11:25 06/21/2017 16:22 Full Code [id number]  Algernon Huxley, MD Inpatient   04/18/2017 11:50 04/18/2017 13:02 Full Code [id number]  Algernon Huxley, MD Inpatient   04/18/2017 11:04 04/18/2017 11:50 Full Code [id number]  Algernon Huxley, MD Inpatient     Family Communication:  spoke with son at the bedside Disposition Plan: [facility] potentially Monday  Antibiotics:  Zosyn  Time spent: 25 minutes  [place]

## 2018-01-14 MED ORDER — AMOXICILLIN-POT CLAVULANATE 500-125 MG PO TABS
1.0000 | ORAL_TABLET | Freq: Two times a day (BID) | ORAL | Status: DC
  Administered 2018-01-14: 500 mg via ORAL
  Filled 2018-01-14 (×2): qty 1

## 2018-01-14 MED ORDER — AMOXICILLIN-POT CLAVULANATE 500-125 MG PO TABS: 1.0000 | ORAL_TABLET | Freq: Two times a day (BID) | ORAL | 0 refills | Status: AC

## 2018-01-14 MED ORDER — FLEET ENEMA 7-19 GM/118ML RE ENEM: 1.0000 | ENEMA | RECTAL | Status: DC

## 2018-01-14 MED ORDER — ACETAMINOPHEN 325 MG PO TABS: 650.0000 mg | ORAL_TABLET | Freq: Four times a day (QID) | ORAL | Status: DC | PRN

## 2018-01-14 MED ORDER — SENNOSIDES-DOCUSATE SODIUM 8.6-50 MG PO TABS: 1.0000 | ORAL_TABLET | Freq: Two times a day (BID) | ORAL | 0 refills | Status: DC | PRN

## 2018-01-14 NOTE — Care Management Important Message (Signed)
Important Message  Patient Details  Name: Dawn [name] MRN: [id number] Date of Birth: 1928/02/06   Medicare Important Message Given:  Yes Obtained another signature from patient's son to assure full understanding of notice. Notice signed, one given to patient's son and the other to HIM for [place], Della Homan R, RN 01/14/2018, 9:04 AM

## 2018-01-14 NOTE — Progress Notes (Signed)
Pt. Slept well throughout the night with no SOB or acute distress noted. Requested benadryl for sleep.

## 2018-01-14 NOTE — Clinical Social Work Placement (Signed)
   CLINICAL SOCIAL WORK PLACEMENT  NOTE  Date:  01/14/2018  Patient Details  Name: Dawn [name] MRN: [id number] Date of Birth: 01-29-1928  Clinical Social Work is seeking post-discharge placement for this patient at the [place] level of care (*CSW will initial, date and re-position this form in  chart as items are completed):  Yes   Patient/family provided with [place] Work Department's list of facilities offering this level of care within the geographic area requested by the patient (or if unable, by the patient's family).  Yes   Patient/family informed of their freedom to choose among providers that offer the needed level of care, that participate in Medicare, Medicaid or managed care program needed by the patient, have an available bed and are willing to accept the patient.  Yes   Patient/family informed of [facility]'s ownership interest in [facility] and [facility], as well as of the fact that they are under no obligation to receive care at these facilities.  PASRR submitted to EDS on 01/09/18     PASRR number received on       Existing PASRR number confirmed on 01/09/18     FL2 transmitted to all facilities in geographic area requested by pt/family on       FL2 transmitted to all facilities within larger geographic area on       Patient informed that his/her managed care company has contracts with or will negotiate with certain facilities, including the following:        Yes   Patient/family informed of bed offers received.  Patient chooses bed at [facility]     Physician recommends and patient chooses bed at      Patient to be transferred to [facility] on 01/14/18.  Patient to be transferred to facility by [facility]     Patient family notified on 01/14/18 of transfer.  Name of family member notified:  Patient's son Legrand Como was at bedside.     PHYSICIAN Please sign FL2      Additional Comment:    _______________________________________________ Ross Ludwig, LCSWA 01/14/2018, 12:38 PM

## 2018-01-14 NOTE — Clinical Social Work Note (Signed)
Patient to be d/c'ed today to [facility].  Patient and family agreeable to plans will transport via ems RN to call report to [phone] room [place].  Son was at bedside and is aware of discharge today.  Evette Cristal, MSW, [place]

## 2018-01-14 NOTE — Discharge Summary (Signed)
Dawn [name] at [place] NAME: Dawn [name]    MR#:  [id number]  DATE OF BIRTH:  [birth year]  DATE OF ADMISSION:  01/03/2018 ADMITTING PHYSICIAN: Gorden Harms, MD  DATE OF DISCHARGE: 01/14/2018  PRIMARY CARE PHYSICIAN: Idelle Crouch, MD    ADMISSION DIAGNOSIS:  Dehydration [E86.0] Lower urinary tract infection [N39.0] Elevated lactic acid level [R79.89] Diarrhea, unspecified type [R19.7]  DISCHARGE DIAGNOSIS:  Active Problems:   Sepsis ([facility])   Pressure injury of skin   SECONDARY DIAGNOSIS:   Past Medical History:  Diagnosis Date  . Cancer ([facility])    skin cancer  . 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 Hypertension   . Sciatica     HOSPITAL COURSE:   1.  Clinical sepsis on presentation secondary to urinary tract infection growing [facility].  Patient received 5 days of Rocephin in the hospital and completed the course. 2.  Neurovirus infection with diarrhea on presentation. 3.  Pneumonia that developed in the hospital.  The patient was started on Zosyn and will switch over to Augmentin upon discharge for 5 more days. 4.  Difficulty swallowing and dysphagia 3 diet with nectar thickened liquids 5.  Acute on chronic blood loss anemia.  The patient received 2 units of packed red blood cells during the hospital course.  Patient was held on aspirin and Plavix.  At this point I think we can restart the aspirin and check hemoglobin on a weekly basis. 6.  Peripheral vascular disease.  Patient had numerous procedures of her lower extremities.  Try to restart aspirin.  Follow-up hemoglobin at facility. 7.  Hypokalemia and hypomagnesemia repleted during the hospital course 8.  Severe protein calorie malnutrition 9.  History of COPD.  Will check pulse ox on room air.  Yesterday she was off oxygen.  Unclear why she is on oxygen right at this point.  If pulse ox less than 88% can  continue oxygen at facility. 10.  Stage II decubitus ulcer.  Continue to monitor 11.  Essential hypertension continue usual medications 12.  Weakness.  Back to [facility] for rehab 13.  Consider palliative care consultation over at facility  DISCHARGE CONDITIONS:   Failure    DRUG ALLERGIES:   Allergies  Allergen Reactions  . Percocet [Oxycodone-Acetaminophen] Itching  . Percodan [Oxycodone-Aspirin] Itching    DISCHARGE MEDICATIONS:   Allergies as of 01/14/2018      Reactions   Percocet [oxycodone-acetaminophen] Itching   Percodan [oxycodone-aspirin] Itching      Medication List    STOP taking these medications   clopidogrel 75 MG tablet Commonly known as:  PLAVIX   hydrochlorothiazide 25 MG tablet Commonly known as:  HYDRODIURIL   naproxen sodium 220 MG tablet Commonly known as:  ALEVE     TAKE these medications   acetaminophen 325 MG tablet Commonly known as:  TYLENOL Take 2 tablets (650 mg total) by mouth every 6 (six) hours as needed for mild pain (or Fever >/= 101).   acidophilus Caps capsule Take 1 capsule by mouth daily.   amLODipine 10 MG tablet Commonly known as:  NORVASC Take 1 tablet (10 mg total) by mouth daily.   amoxicillin-clavulanate 500-125 MG tablet Commonly known as:  AUGMENTIN Take 1 tablet (500 mg total) by mouth 2 (two) times daily for 5 days.   aspirin EC 81 MG tablet Take 81 mg by mouth daily.  atorvastatin 20 MG tablet Commonly known as:  LIPITOR TAKE 1 TABLET BY MOUTH ONCE DAILY AT  6PM   B-12 5000 MCG Caps Take 5,000 mcg by mouth daily.   cloNIDine 0.1 MG tablet Commonly known as:  CATAPRES Take 1 tablet (0.1 mg total) by mouth 2 (two) times daily. What changed:  when to take this   Gerhardt's butt cream Crea Apply 1 application topically 2 (two) times daily.   guaifenesin 100 MG/5ML syrup Commonly known as:  ROBITUSSIN Take 400 mg by mouth 3 (three) times daily as needed for cough.   indomethacin 50 MG  capsule Commonly known as:  INDOCIN Take 50 mg by mouth 2 (two) times daily as needed (for gout flares).   ipratropium-albuterol 0.5-2.5 (3) MG/3ML Soln Commonly known as:  DUONEB Take 3 mLs by nebulization every 6 (six) hours as needed.   ketorolac 0.4 % Soln Commonly known as:  ACULAR Place 1 drop into the left eye 3 (three) times daily.   LEG-GESIC PO Take 0.5 tablets by mouth at bedtime. LEGATRIN PM   ondansetron 4 MG tablet Commonly known as:  ZOFRAN Take 1 tablet (4 mg total) by mouth every 6 (six) hours as needed for nausea.   pantoprazole 40 MG tablet Commonly known as:  PROTONIX Take 1 tablet (40 mg total) by mouth 2 (two) times daily before a meal.   senna-docusate 8.6-50 MG tablet Commonly known as:  Senokot-S Take 1 tablet by mouth 2 (two) times daily as needed for mild constipation.   traMADol 50 MG tablet Commonly known as:  ULTRAM Take 1 tablet (50 mg total) by mouth every 6 (six) hours as needed. What changed:    when to take this  reasons to take this   tretinoin 0.05 % cream Commonly known as:  RETIN-A Apply 1 application topically every other day. EVERY OTHER NIGHT   Vitamin D3 400 units Caps Take 400 Units by mouth daily.        DISCHARGE INSTRUCTIONS:    Follow-up with Dr. at facility 1 day  If you experience worsening of your admission symptoms, develop shortness of breath, life threatening emergency, suicidal or homicidal thoughts you must seek medical attention immediately by calling 911 or calling your MD immediately  if symptoms less severe.  You Must read complete instructions/literature along with all the possible adverse reactions/side effects for all the Medicines you take and that have been prescribed to you. Take any new Medicines after you have completely understood and accept all the possible adverse reactions/side effects.   Please note  You were cared for by a hospitalist during your hospital stay. If you have any questions  about your discharge medications or the care you received while you were in the hospital after you are discharged, you can call the unit and asked to speak with the hospitalist on call if the hospitalist that took care of you is not available. Once you are discharged, your primary care physician will handle any further medical issues. Please note that NO REFILLS for any discharge medications will be authorized once you are discharged, as it is imperative that you return to your primary care physician (or establish a relationship with a primary care physician if you do not have one) for your aftercare needs so that they can reassess your need for medications and monitor your lab values.    Today   CHIEF COMPLAINT:   Chief Complaint  Patient presents with  . Emesis  . Diarrhea  HISTORY OF PRESENT ILLNESS:  Dawn [name]  is a 84 y.o. female presented with diarrhea and   VITAL SIGNS:  Blood pressure (!) 124/56, pulse 76, temperature 97.8 F (36.6 C), temperature source Oral, resp. rate 18, height 5\' 3"  (1.6 m), weight 126.1 kg (278 lb), SpO2 95 %.    PHYSICAL EXAMINATION:  GENERAL:  84 y.o.-year-old patient lying in the bed with no acute distress.  EYES: Pupils equal, round, reactive to light and accommodation. No scleral icterus. Extraocular muscles intact.  HEENT: Head atraumatic, normocephalic. Oropharynx and nasopharynx clear.  NECK:  Supple, no jugular venous distention. No thyroid enlargement, no tenderness.  LUNGS: Normal breath sounds bilaterally, no wheezing, rales,rhonchi or crepitation. No use of accessory muscles of respiration.  CARDIOVASCULAR: S1, S2 normal. No murmurs, rubs, or gallops.  ABDOMEN: Soft, non-tender, non-distended. Bowel sounds present. No organomegaly or mass.  EXTREMITIES: No pedal edema, cyanosis, or clubbing.  NEUROLOGIC: Cranial nerves II through XII are intact. Muscle strength 5/5 in all extremities. Sensation intact. Gait not checked.   PSYCHIATRIC: The patient is alert and oriented x 3.  SKIN: Chronic lower extremity brownish discoloration  DATA REVIEW:   CBC Recent Labs  Lab 01/10/18 0536  WBC 7.7  HGB 10.8*  HCT 32.3*  PLT 246    Chemistries  Recent Labs  Lab 01/11/18 0453 01/12/18 0506 01/13/18 0446  NA 138  --   --   K 3.6 3.3* 4.2  CL 103  --   --   CO2 29  --   --   GLUCOSE 90  --   --   BUN 19  --   --   CREATININE 0.68  --   --   CALCIUM 8.5*  --   --   MG 1.6* 2.1  --      Microbiology Results  Results for orders placed or performed during the hospital encounter of 01/03/18  Urine Culture     Status: Abnormal   Collection Time: 01/03/18 11:41 AM  Result Value Ref Range Status   Specimen Description   Final    URINE, RANDOM Performed at [facility], [address], [place], [place] [address]    Special Requests   Final    NONE Performed at [facility], [address] [place], [place] [address]    Culture >=100,000 COLONIES/mL PROVIDENCIA RETTGERI (A)  Final   Report Status 01/05/2018 FINAL  Final   Organism ID, Bacteria PROVIDENCIA RETTGERI (A)  Final      Susceptibility   Providencia rettgeri - MIC*    AMPICILLIN RESISTANT Resistant     CEFAZOLIN >=64 RESISTANT Resistant     CEFTRIAXONE <=1 SENSITIVE Sensitive     CIPROFLOXACIN <=0.25 SENSITIVE Sensitive     GENTAMICIN <=1 SENSITIVE Sensitive     IMIPENEM 4 SENSITIVE Sensitive     NITROFURANTOIN 256 RESISTANT Resistant     TRIMETH/SULFA <=20 SENSITIVE Sensitive     AMPICILLIN/SULBACTAM >=32 RESISTANT Resistant     PIP/TAZO <=4 SENSITIVE Sensitive     * >=100,000 COLONIES/mL PROVIDENCIA RETTGERI  Blood Culture (routine x 2)     Status: None   Collection Time: 01/03/18  1:00 PM  Result Value Ref Range Status   Specimen Description BLOOD RFOA  Final   Special Requests   Final    BOTTLES DRAWN AEROBIC AND ANAEROBIC Blood Culture adequate volume   Culture   Final    NO GROWTH 5 DAYS Performed at  [facility] [place]  [address], [place], [place] [address]    Report Status 01/08/2018 FINAL  Final  Blood Culture (routine x 2)     Status: None   Collection Time: 01/03/18  1:00 PM  Result Value Ref Range Status   Specimen Description BLOOD RFOA  Final   Special Requests   Final    BOTTLES DRAWN AEROBIC AND ANAEROBIC Blood Culture adequate volume   Culture   Final    NO GROWTH 5 DAYS Performed at [facility], [address] [place], [place] [address]    Report Status 01/08/2018 FINAL  Final  Gastrointestinal Panel by PCR , Stool     Status: Abnormal   Collection Time: 01/03/18  3:27 PM  Result Value Ref Range Status   Campylobacter species NOT DETECTED NOT DETECTED Final   Plesimonas shigelloides NOT DETECTED NOT DETECTED Final   Salmonella species NOT DETECTED NOT DETECTED Final   Yersinia enterocolitica NOT DETECTED NOT DETECTED Final   Vibrio species NOT DETECTED NOT DETECTED Final   Vibrio cholerae NOT DETECTED NOT DETECTED Final   Enteroaggregative E coli (EAEC) NOT DETECTED NOT DETECTED Final   Enteropathogenic E coli (EPEC) NOT DETECTED NOT DETECTED Final   Enterotoxigenic E coli (ETEC) NOT DETECTED NOT DETECTED Final   Shiga like toxin producing E coli (STEC) NOT DETECTED NOT DETECTED Final   Shigella/Enteroinvasive E coli (EIEC) NOT DETECTED NOT DETECTED Final   Cryptosporidium NOT DETECTED NOT DETECTED Final   Cyclospora cayetanensis NOT DETECTED NOT DETECTED Final   Entamoeba histolytica NOT DETECTED NOT DETECTED Final   Giardia lamblia NOT DETECTED NOT DETECTED Final   Adenovirus F40/41 NOT DETECTED NOT DETECTED Final   Astrovirus NOT DETECTED NOT DETECTED Final   Norovirus GI/GII DETECTED (A) NOT DETECTED Final    Comment: RESULT CALLED TO, READ BACK BY AND VERIFIED WITH: JANCY JOHNSTONE AT 0947 ON 01/03/2018 JJB    Rotavirus A NOT DETECTED NOT DETECTED Final   Sapovirus (I, II, IV, and V) NOT DETECTED NOT DETECTED Final    Comment: Performed  at [facility], [address] [place], [place] [address]  MRSA PCR Screening     Status: None   Collection Time: 01/03/18  6:39 PM  Result Value Ref Range Status   MRSA by PCR NEGATIVE NEGATIVE Final    Comment:        The GeneXpert MRSA Assay (FDA approved for NASAL specimens only), is one component of a comprehensive MRSA colonization surveillance program. It is not intended to diagnose MRSA infection nor to guide or monitor treatment for MRSA infections. Performed at [facility], [address] [place], [place] [address]     RADIOLOGY:  Dg Chest Port 1 View  Result Date: 01/12/2018 CLINICAL DATA:  Fever, diarrhea, weakness and altered mental status. EXAM: PORTABLE CHEST 1 VIEW COMPARISON:  CT chest and chest radiograph 01/03/2018. FINDINGS: Trachea is midline. Heart size normal. Thoracic aorta is calcified. Mild bibasilar hazy airspace opacification. There may be tiny bilateral pleural effusions. Osteopenia.  Degenerative changes in the shoulders. IMPRESSION: 1. Increasing hazy opacification of the lung bases may be due to evolving pneumonia. 2. Small bilateral pleural effusions. 3.  Aortic atherosclerosis (ICD10-170.0). Electronically Signed   By: Lorin Picket M.D.   On: 01/12/2018 08:19      Management plans discussed with the patient, family and they are in agreement.  CODE STATUS:     Code Status Orders  (From admission, onward)        Start  Ordered   01/03/18 1521  Full code  Continuous     01/03/18 1521    Code Status History    Date Active Date Inactive Code Status Order ID Comments User Context   11/09/2017 11:31 11/22/2017 19:13 Full Code [id number]  Leonides Schanz Inpatient   11/09/2017 10:19 11/09/2017 11:22 Full Code [id number]  Leonides Schanz Outpatient   11/09/2017 10:19 11/09/2017 10:19 Full Code [id number]  Algernon Huxley, MD Outpatient   07/12/2017 10:05 07/12/2017 16:56 Full Code [id number]  Algernon Huxley, MD Inpatient   06/21/2017 11:25 06/21/2017 16:22 Full Code [id number]  Algernon Huxley, MD Inpatient   04/18/2017 11:50 04/18/2017 13:02 Full Code [id number]  Algernon Huxley, MD Inpatient   04/18/2017 11:04 04/18/2017 11:50 Full Code [id number]  Algernon Huxley, MD Inpatient      TOTAL TIME TAKING CARE OF THIS PATIENT: 35 minutes.    Loletha Grayer M.D on 01/14/2018 at 8:00 AM  Between 7am to 6pm - Pager - [phone]  After 6pm go to [email or web address] - password [email or web address] [facility]  [phone]  CC: Primary care physician; Idelle Crouch, MD

## 2018-01-22 ENCOUNTER — Encounter: Payer: Self-pay | Admitting: Emergency Medicine

## 2018-01-22 ENCOUNTER — Emergency Department: Payer: Medicare Other

## 2018-01-22 ENCOUNTER — Other Ambulatory Visit: Payer: Self-pay

## 2018-01-22 ENCOUNTER — Observation Stay
Admission: EM | Admit: 2018-01-22 | Discharge: 2018-01-23 | Disposition: A | Discharge status: Skilled Nursing Facility | Payer: Medicare Other | Attending: Internal Medicine | Admitting: Internal Medicine

## 2018-01-22 DIAGNOSIS — R471 Dysarthria and anarthria: Secondary | ICD-10-CM | POA: Diagnosis not present

## 2018-01-22 DIAGNOSIS — E785 Hyperlipidemia, unspecified: Secondary | ICD-10-CM | POA: Insufficient documentation

## 2018-01-22 DIAGNOSIS — J449 Chronic obstructive pulmonary disease, unspecified: Secondary | ICD-10-CM | POA: Insufficient documentation

## 2018-01-22 DIAGNOSIS — G459 Transient cerebral ischemic attack, unspecified: Secondary | ICD-10-CM | POA: Diagnosis present

## 2018-01-22 DIAGNOSIS — Z79891 Long term (current) use of opiate analgesic: Secondary | ICD-10-CM | POA: Diagnosis not present

## 2018-01-22 DIAGNOSIS — I10 Essential (primary) hypertension: Secondary | ICD-10-CM | POA: Insufficient documentation

## 2018-01-22 DIAGNOSIS — I739 Peripheral vascular disease, unspecified: Secondary | ICD-10-CM | POA: Diagnosis not present

## 2018-01-22 DIAGNOSIS — N390 Urinary tract infection, site not specified: Secondary | ICD-10-CM | POA: Diagnosis not present

## 2018-01-22 DIAGNOSIS — Z96641 Presence of right artificial hip joint: Secondary | ICD-10-CM | POA: Diagnosis not present

## 2018-01-22 DIAGNOSIS — E876 Hypokalemia: Secondary | ICD-10-CM | POA: Insufficient documentation

## 2018-01-22 DIAGNOSIS — Z7982 Long term (current) use of aspirin: Secondary | ICD-10-CM | POA: Diagnosis not present

## 2018-01-22 DIAGNOSIS — R4781 Slurred speech: Secondary | ICD-10-CM | POA: Diagnosis not present

## 2018-01-22 DIAGNOSIS — E559 Vitamin D deficiency, unspecified: Secondary | ICD-10-CM | POA: Diagnosis not present

## 2018-01-22 DIAGNOSIS — I639 Cerebral infarction, unspecified: Principal | ICD-10-CM | POA: Insufficient documentation

## 2018-01-22 DIAGNOSIS — Z79899 Other long term (current) drug therapy: Secondary | ICD-10-CM | POA: Diagnosis not present

## 2018-01-22 DIAGNOSIS — E538 Deficiency of other specified B group vitamins: Secondary | ICD-10-CM | POA: Diagnosis not present

## 2018-01-22 DIAGNOSIS — Z85828 Personal history of other malignant neoplasm of skin: Secondary | ICD-10-CM | POA: Insufficient documentation

## 2018-01-22 DIAGNOSIS — I6523 Occlusion and stenosis of bilateral carotid arteries: Secondary | ICD-10-CM | POA: Insufficient documentation

## 2018-01-22 DIAGNOSIS — Z7952 Long term (current) use of systemic steroids: Secondary | ICD-10-CM | POA: Insufficient documentation

## 2018-01-22 DIAGNOSIS — Z96643 Presence of artificial hip joint, bilateral: Secondary | ICD-10-CM | POA: Diagnosis not present

## 2018-01-22 DIAGNOSIS — Z87891 Personal history of nicotine dependence: Secondary | ICD-10-CM | POA: Insufficient documentation

## 2018-01-22 LAB — BASIC METABOLIC PANEL
Anion gap: 12 (ref 5–15)
BUN: 18 mg/dL (ref 6–20)
CHLORIDE: 101 mmol/L (ref 101–111)
CO2: 25 mmol/L (ref 22–32)
Calcium: 8.6 mg/dL — ABNORMAL LOW (ref 8.9–10.3)
Creatinine, Ser: 0.77 mg/dL (ref 0.44–1.00)
Glucose, Bld: 113 mg/dL — ABNORMAL HIGH (ref 65–99)
POTASSIUM: 3.1 mmol/L — AB (ref 3.5–5.1)
SODIUM: 138 mmol/L (ref 135–145)

## 2018-01-22 LAB — URINALYSIS, COMPLETE (UACMP) WITH MICROSCOPIC
BILIRUBIN URINE: NEGATIVE
Glucose, UA: NEGATIVE mg/dL
Hgb urine dipstick: NEGATIVE
KETONES UR: NEGATIVE mg/dL
NITRITE: NEGATIVE
PH: 6 (ref 5.0–8.0)
PROTEIN: NEGATIVE mg/dL
Specific Gravity, Urine: 1.011 (ref 1.005–1.030)

## 2018-01-22 LAB — CBC
HEMATOCRIT: 31.4 % — AB (ref 35.0–47.0)
Hemoglobin: 10 g/dL — ABNORMAL LOW (ref 12.0–16.0)
MCH: 27 pg (ref 26.0–34.0)
MCHC: 31.9 g/dL — ABNORMAL LOW (ref 32.0–36.0)
MCV: 84.7 fL (ref 80.0–100.0)
Platelets: 338 10*3/uL (ref 150–440)
RBC: 3.71 MIL/uL — ABNORMAL LOW (ref 3.80–5.20)
RDW: 16.7 % — AB (ref 11.5–14.5)
WBC: 7.6 10*3/uL (ref 3.6–11.0)

## 2018-01-22 LAB — PROTIME-INR
INR: 0.97
PROTHROMBIN TIME: 12.8 s (ref 11.4–15.2)

## 2018-01-22 LAB — URINE DRUG SCREEN, QUALITATIVE (ARMC ONLY)
Amphetamines, Ur Screen: NOT DETECTED
BARBITURATES, UR SCREEN: NOT DETECTED
Benzodiazepine, Ur Scrn: NOT DETECTED
CANNABINOID 50 NG, UR ~~LOC~~: NOT DETECTED
Cocaine Metabolite,Ur ~~LOC~~: NOT DETECTED
MDMA (ECSTASY) UR SCREEN: NOT DETECTED
Methadone Scn, Ur: NOT DETECTED
Opiate, Ur Screen: NOT DETECTED
PHENCYCLIDINE (PCP) UR S: NOT DETECTED
Tricyclic, Ur Screen: NOT DETECTED

## 2018-01-22 LAB — GLUCOSE, CAPILLARY: Glucose-Capillary: 113 mg/dL — ABNORMAL HIGH (ref 65–99)

## 2018-01-22 LAB — APTT: aPTT: 24 seconds (ref 24–36)

## 2018-01-22 LAB — TROPONIN I: Troponin I: 0.03 ng/mL (ref <0.03)

## 2018-01-22 LAB — ETHANOL: Alcohol, Ethyl (B): <10 mg/dL (ref <10)

## 2018-01-22 MED ORDER — ATORVASTATIN CALCIUM 20 MG PO TABS
20.0000 mg | ORAL_TABLET | Freq: Every day | ORAL | Status: DC
  Administered 2018-01-23: 20 mg via ORAL
  Filled 2018-01-22: qty 1

## 2018-01-22 MED ORDER — PREDNISONE 20 MG PO TABS
20.0000 mg | ORAL_TABLET | Freq: Every day | ORAL | Status: DC
  Administered 2018-01-22 – 2018-01-23 (×2): 20 mg via ORAL
  Filled 2018-01-22 (×2): qty 1

## 2018-01-22 MED ORDER — ASPIRIN 300 MG RE SUPP: 300.0000 mg | Freq: Every day | RECTAL | Status: DC

## 2018-01-22 MED ORDER — ASPIRIN 325 MG PO TABS
325.0000 mg | ORAL_TABLET | Freq: Every day | ORAL | Status: DC
  Administered 2018-01-23: 11:00:00 325 mg via ORAL
  Filled 2018-01-22 (×2): qty 1

## 2018-01-22 MED ORDER — ASPIRIN 300 MG RE SUPP
RECTAL | Status: AC
  Filled 2018-01-22: qty 1

## 2018-01-22 MED ORDER — ACETAMINOPHEN 160 MG/5ML PO SOLN
650.0000 mg | ORAL | Status: DC | PRN
  Administered 2018-01-22: 650 mg
  Filled 2018-01-22 (×2): qty 20.3

## 2018-01-22 MED ORDER — ASPIRIN 300 MG RE SUPP
300.0000 mg | Freq: Once | RECTAL | Status: AC
  Administered 2018-01-22: 300 mg via RECTAL

## 2018-01-22 MED ORDER — ACETAMINOPHEN 650 MG RE SUPP
650.0000 mg | RECTAL | Status: DC | PRN
Start: 2018-01-22 — End: 2018-01-23

## 2018-01-22 MED ORDER — SENNOSIDES-DOCUSATE SODIUM 8.6-50 MG PO TABS
1.0000 | ORAL_TABLET | Freq: Two times a day (BID) | ORAL | Status: DC | PRN
  Administered 2018-01-22: 22:00:00 1 via ORAL
  Filled 2018-01-22: qty 1

## 2018-01-22 MED ORDER — AMLODIPINE BESYLATE 10 MG PO TABS
10.0000 mg | ORAL_TABLET | Freq: Every day | ORAL | Status: DC
  Administered 2018-01-22 – 2018-01-23 (×2): 10 mg via ORAL
  Filled 2018-01-22 (×2): qty 1
  Filled 2018-01-22: qty 2

## 2018-01-22 MED ORDER — PANTOPRAZOLE SODIUM 40 MG PO TBEC
40.0000 mg | DELAYED_RELEASE_TABLET | Freq: Two times a day (BID) | ORAL | Status: DC
  Administered 2018-01-23 (×2): 40 mg via ORAL
  Filled 2018-01-22 (×2): qty 1

## 2018-01-22 MED ORDER — STROKE: EARLY STAGES OF RECOVERY BOOK
Freq: Once | Status: AC
  Administered 2018-01-22: 22:00:00

## 2018-01-22 MED ORDER — RISAQUAD PO CAPS
1.0000 | ORAL_CAPSULE | Freq: Every day | ORAL | Status: DC
  Administered 2018-01-22 – 2018-01-23 (×2): 1 via ORAL
  Filled 2018-01-22 (×2): qty 1

## 2018-01-22 MED ORDER — IPRATROPIUM-ALBUTEROL 0.5-2.5 (3) MG/3ML IN SOLN: 3.0000 mL | Freq: Four times a day (QID) | RESPIRATORY_TRACT | Status: DC | PRN

## 2018-01-22 MED ORDER — ASPIRIN 81 MG PO CHEW
324.0000 mg | CHEWABLE_TABLET | Freq: Once | ORAL | Status: DC
  Filled 2018-01-22: qty 4

## 2018-01-22 MED ORDER — ACETAMINOPHEN 325 MG PO TABS: 650.0000 mg | ORAL_TABLET | ORAL | Status: DC | PRN

## 2018-01-22 NOTE — ED Triage Notes (Signed)
Pt to ED via EMS from [facility] , per staff at facility pt had an episode of aphasia and nonverbal from aprox 2:25 pm to 2:40pm. PT unaware of episode. Pt is now at baseline A&Ox4, VS stable

## 2018-01-22 NOTE — Progress Notes (Signed)
   01/22/18 1547  Clinical Encounter Type  Visited With Patient;Health care provider;Other (Comment) (son arrived after patient )  Visit Type Initial  Spiritual Encounters  Spiritual Needs Emotional;Prayer   Chaplain responded to Code Stroke, maintained pastoral presence and offered silent prayers for care team and patient.  On-call chaplain paged unit chaplain, checked in with chaplain Abbey Chatters, on-call chaplain to stay as son is on his way.  Chaplain offered emotional support to patient and her son.  Patient shared thoughts regarding recent health circumstances; patient requested prayer which chaplain led.

## 2018-01-22 NOTE — ED Provider Notes (Signed)
[facility] Emergency Department Provider Note  ____________________________________________  Time seen: Approximately 3:52 PM  I have reviewed the triage vital signs and the nursing notes.   HISTORY  Chief Complaint Transient Ischemic Attack   HPI Sherma Hanks Futrell is a 84 y.o. female with a history of peripheral vascular disease, carotid stenosis, COPD, anemia, HTN who presents for evaluation of a aphasia. Patient was last seen normal around 2:25 PM today when she developed a sudden onset episode of complete aphasia that lasted about 15 minutes. Patient able to talk again at 2:40PM however continues to complain of mild slurred speech. Patient denies any weakness or numbness, she does not have a headache, no prior history of stroke. No chest pain, palpitations, shortness of breath, fever or chills.  Past Medical History:  Diagnosis Date  . Cancer ([place])    skin cancer  . 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 Hypertension   . Sciatica     Patient Active Problem List   Diagnosis Date Noted  . Pressure injury of skin 01/04/2018  . Sepsis ([place]) 01/03/2018  . Protein-calorie malnutrition, severe 11/14/2017  . Atherosclerosis of native arteries of extremity with rest pain ([place]) 11/09/2017  . Ischemic leg 11/09/2017  . Atherosclerotic peripheral vascular disease with rest pain ([place]) 11/09/2017  . Atypical chest pain 09/21/2017  . Renal artery stenosis ([place]) 06/15/2017  . Hyperlipidemia 05/09/2017  . Essential hypertension 10/17/2016  . Carotid stenosis 10/17/2016  . PVD (peripheral vascular disease) ([place]) 10/17/2016  . B12 deficiency 07/19/2016  . Vitamin D deficiency, unspecified 07/19/2016  . History of right hip replacement 11/04/2015  . Aftercare following bilateral hip joint replacement surgery 11/05/2014  . SCC (squamous cell carcinoma) 05/21/2014  . COPD with asthma ([place]) 03/16/2014  .  S/P hip replacement 10/03/2012    Past Surgical History:  Procedure Laterality Date  . ABDOMINAL HYSTERECTOMY    . APPENDECTOMY    . CATARACT EXTRACTION, BILATERAL    . CHOLECYSTECTOMY    . ENDARTERECTOMY FEMORAL Right 04/19/2017   Procedure: ENDARTERECTOMY COMMON FEMORAL ARTERY, PROFUNDA  FEMORIS ARTERY, and  SUPERFICIAL FEMORAL ARTERY;  Surgeon: Algernon Huxley, MD;  Location: [facility] ORS;  Service: Vascular;  Laterality: Right;  . ENDARTERECTOMY FEMORAL Left 11/14/2017   Procedure: ENDARTERECTOMY FEMORAL;  Surgeon: Algernon Huxley, MD;  Location: [facility] ORS;  Service: Vascular;  Laterality: Left;  . HIP SURGERY Bilateral   . LOWER EXTREMITY ANGIOGRAM Right 04/19/2017   Procedure: LOWER EXTREMITY ANGIOGRAM WITH SUPERFICIAL FEMORAL ARTERY STENT PLACEMENT;  Surgeon: Algernon Huxley, MD;  Location: [facility] ORS;  Service: Vascular;  Laterality: Right;  . LOWER EXTREMITY ANGIOGRAPHY Right 04/18/2017   Procedure: Lower Extremity Angiography;  Surgeon: Algernon Huxley, MD;  Location: [place] [facility];  Service: Cardiovascular;  Laterality: Right;  . LOWER EXTREMITY ANGIOGRAPHY Left 07/12/2017   Procedure: Lower Extremity Angiography;  Surgeon: Algernon Huxley, MD;  Location: [place] [facility];  Service: Cardiovascular;  Laterality: Left;  . LOWER EXTREMITY ANGIOGRAPHY Left 11/09/2017   Procedure: LOWER EXTREMITY ANGIOGRAPHY;  Surgeon: Algernon Huxley, MD;  Location: [place] [facility];  Service: Cardiovascular;  Laterality: Left;  . LOWER EXTREMITY INTERVENTION  07/12/2017   Procedure: Lower Extremity Intervention;  Surgeon: Algernon Huxley, MD;  Location: [place] [facility];  Service: Cardiovascular;;  . LOWER EXTREMITY INTERVENTION  11/09/2017   Procedure: LOWER EXTREMITY INTERVENTION;  Surgeon: Algernon Huxley, MD;  Location: [place] [facility];  Service: Cardiovascular;;  . RENAL ANGIOGRAPHY N/A 06/21/2017   Procedure: Renal Angiography;  Surgeon: Algernon Huxley, MD;  Location: [place];  Service:  Cardiovascular;  Laterality: N/A;  . SHOULDER SURGERY Bilateral   . TOTAL HIP ARTHROPLASTY Bilateral     Prior to Admission medications   Medication Sig Start Date End Date Taking? Authorizing Provider  acidophilus (RISAQUAD) CAPS capsule Take 1 capsule by mouth daily. 11/23/17  Yes Algernon Huxley, MD  amLODipine (NORVASC) 10 MG tablet Take 1 tablet (10 mg total) by mouth daily. 11/23/17  Yes Algernon Huxley, MD  aspirin EC 81 MG tablet Take 81 mg by mouth daily.   Yes [provider]  atorvastatin (LIPITOR) 20 MG tablet TAKE 1 TABLET BY MOUTH ONCE DAILY AT  6PM 08/29/17  Yes Dew, Erskine Squibb, MD  Cholecalciferol (VITAMIN D3) 400 units CAPS Take 400 Units by mouth daily.   Yes [provider]  cloNIDine (CATAPRES) 0.1 MG tablet Take 1 tablet (0.1 mg total) by mouth 2 (two) times daily. Patient taking differently: Take 0.1 mg by mouth 2 (two) times daily.  06/08/16  Yes Daymon Larsen, MD  Cyanocobalamin (B-12) 5000 MCG CAPS Take 5,000 mcg by mouth daily.    Yes [provider]  guaiFENesin (MUCINEX) 600 MG 12 hr tablet Take 600 mg by mouth 2 (two) times daily. 01/16/18 01/26/18 Yes [provider]  guaifenesin (ROBITUSSIN) 100 MG/5ML syrup Take 400 mg by mouth 3 (three) times daily as needed for cough.   Yes [provider]  ipratropium-albuterol (DUONEB) 0.5-2.5 (3) MG/3ML SOLN Take 3 mLs by nebulization every 6 (six) hours as needed. Patient taking differently: Take 3 mLs by nebulization every 6 (six) hours as needed. For shortness of breath 01/11/18  Yes Demetrios Loll, MD  ketorolac (ACULAR) 0.4 % SOLN Place 1 drop into the left eye 3 (three) times daily.    Yes [provider]  Multiple Minerals-Vitamins (LEG-GESIC PO) Take 0.5 tablets by mouth at bedtime as needed. For restless leg syndrome (legatrin pm)   Yes [provider]  ondansetron (ZOFRAN-ODT) 4 MG disintegrating tablet Take 4 mg by mouth every 6 (six) hours as needed for nausea.   Yes  [provider]  pantoprazole (PROTONIX) 40 MG tablet Take 1 tablet (40 mg total) by mouth 2 (two) times daily before a meal. 01/11/18  Yes Demetrios Loll, MD  predniSONE (DELTASONE) 10 MG tablet Take 20 mg by mouth daily. 01/22/18 01/24/18 Yes [provider]  senna-docusate (SENOKOT-S) 8.6-50 MG tablet Take 1 tablet by mouth 2 (two) times daily as needed for mild constipation. 01/14/18  Yes Wieting, Richard, MD  traMADol (ULTRAM) 50 MG tablet Take 1 tablet (50 mg total) by mouth every 6 (six) hours as needed. Patient taking differently: Take 50 mg by mouth every 6 (six) hours as needed for moderate pain.  01/11/18  Yes Demetrios Loll, MD  tretinoin (RETIN-A) 0.05 % cream Apply 1 application topically every other day. EVERY OTHER NIGHT   Yes [provider]  acetaminophen (TYLENOL) 325 MG tablet Take 2 tablets (650 mg total) by mouth every 6 (six) hours as needed for mild pain (or Fever >/= 101). 01/14/18   Loletha Grayer, MD  Hydrocortisone (GERHARDT'S BUTT CREAM) CREA Apply 1 application topically 2 (two) times daily. Patient not taking: Reported on 01/22/2018 01/11/18   Demetrios Loll, MD    Allergies Percocet [oxycodone-acetaminophen] and Percodan [oxycodone-aspirin]  Family  History  Problem Relation Age of Onset  . Diabetes Father   . Heart disease Father     Social History Social History   Tobacco Use  . Smoking status: Former Smoker    Types: Cigarettes    Last attempt to quit: 1980    Years since quitting: 39.1  . Smokeless tobacco: Never Used  Substance Use Topics  . Alcohol use: No  . Drug use: No    Review of Systems  Constitutional: Negative for fever. Eyes: Negative for visual changes. ENT: Negative for sore throat. Neck: No neck pain  Cardiovascular: Negative for chest pain. Respiratory: Negative for shortness of breath. Gastrointestinal: Negative for abdominal pain, vomiting or diarrhea. Genitourinary: Negative for dysuria. Musculoskeletal:  Negative for back pain. Skin: Negative for rash. Neurological: Negative for headaches, weakness or numbness. + Aphasia and slurred speech Psych: No SI or HI  ____________________________________________   PHYSICAL EXAM:  VITAL SIGNS: ED Triage Vitals [01/22/18 1514]  Enc Vitals Group     BP      Pulse Rate 89     Resp 16     Temp 98.1 F (36.7 C)     Temp Source Oral     SpO2 100 %     Weight      Height      Head Circumference      Peak Flow      Pain Score 0     Pain Loc      Pain Edu?      Excl. in [place]?     Constitutional: Alert and oriented x 3. Well appearing and in no apparent distress. HEENT:      Head: Normocephalic and atraumatic.         Eyes: Conjunctivae are normal. Sclera is non-icteric.       Mouth/Throat: Mucous membranes are moist.       Neck: Supple with no signs of meningismus. Cardiovascular: Regular rate and rhythm. No murmurs, gallops, or rubs. 2+ symmetrical distal pulses are present in all extremities. No JVD. Respiratory: Normal respiratory effort. Lungs are clear to auscultation bilaterally. No wheezes, crackles, or rhonchi.  Gastrointestinal: Soft, non tender, and non distended with positive bowel sounds. No rebound or guarding. Musculoskeletal: Nontender with normal range of motion in all extremities. No edema, cyanosis, or erythema of extremities. Neurologic: Slurred speech. A & O x3, PERRL, EOMI, no nystagmus, CN II-XII intact, motor testing reveals good tone and bulk throughout. There is no evidence of pronator drift or dysmetria. Muscle strength is 5/5 throughout on b/l UE and 3/5 on b/l LE. Sensory examination is intact. Gait deferred. Skin: Skin is warm, dry and intact. No rash noted. Psychiatric: Mood and affect are normal. Speech and behavior are normal.   NIH Stroke Scale  Interval: Baseline Time: 4:45 PM Person Administering Scale: [place] stroke scale items in the order listed. Record performance in each  category after each subscale exam. Do not go back and change scores. Follow directions provided for each exam technique. Scores should reflect what the patient does, not what the clinician thinks the patient can do. The clinician should record answers while administering the exam and work quickly. Except where indicated, the patient should not be coached (i.e., repeated requests to patient to make a special effort).   1a  Level of consciousness: 0=alert; keenly responsive  1b. LOC questions:  0=Performs both tasks correctly  1c. LOC commands: 0=Performs both tasks correctly  2.  Best Gaze: 0=normal  3.  Visual: 0=No visual loss  4. Facial Palsy: 0=Normal symmetric movement  5a.  Motor left arm: 0=No drift, limb holds 90 (or 45) degrees for full 10 seconds  5b.  Motor right arm: 0=No drift, limb holds 90 (or 45) degrees for full 10 seconds  6a. motor left leg: 2=Some effort against gravity, limb cannot get to or maintain (if cured) 90 (or 45) degrees, drifts down to bed, but has some effort against gravity  6b  Motor right leg:  2=Some effort against gravity, limb cannot get to or maintain (if cured) 90 (or 45) degrees, drifts down to bed, but has some effort against gravity  7. Limb Ataxia: 2=Present in two limbs  8.  Sensory: 0=Normal; no sensory loss  9. Best Language:  0=No aphasia, normal  10. Dysarthria: 1=Mild to moderate, patient slurs at least some words and at worst, can be understood with some difficulty  11. Extinction and Inattention: 0=No abnormality   Total:   7    ____________________________________________   LABS (all labs ordered are listed, but only abnormal results are displayed)  Labs Reviewed  BASIC METABOLIC PANEL - Abnormal; Notable for the following components:      Result Value   Potassium 3.1 (*)    Glucose, Bld 113 (*)    Calcium 8.6 (*)    All other components within normal limits  CBC - Abnormal; Notable for the following components:   RBC 3.71 (*)     Hemoglobin 10.0 (*)    HCT 31.4 (*)    MCHC 31.9 (*)    RDW 16.7 (*)    All other components within normal limits  GLUCOSE, CAPILLARY - Abnormal; Notable for the following components:   Glucose-Capillary 113 (*)    All other components within normal limits  PROTIME-INR  APTT  URINALYSIS, COMPLETE (UACMP) WITH MICROSCOPIC  TROPONIN I  URINE DRUG SCREEN, QUALITATIVE ([facility] ONLY)  URINALYSIS, ROUTINE W REFLEX MICROSCOPIC  ETHANOL  CBG MONITORING, ED   ____________________________________________  EKG  ED ECG REPORT I, Rudene Re, the attending physician, personally viewed and interpreted this ECG.  Sinus rhythm with frequent PACs, rate of 90, prolonged QTC, normal axis, no ST elevations or depressions. No significant changes when compared to prior ____________________________________________  RADIOLOGY  I have personally reviewed the images performed during this visit and I agree with the Radiologist's read.   Interpretation by Radiologist:  Ct Head Code Stroke Wo Contrast  Result Date: 01/22/2018 CLINICAL DATA:  Code stroke. Episode of aphasia from 225-240 pm today. EXAM: CT HEAD WITHOUT CONTRAST TECHNIQUE: Contiguous axial images were obtained from the base of the skull through the vertex without intravenous contrast. COMPARISON:  08/25/2014 FINDINGS: Brain: No evidence of acute infarction, hemorrhage, hydrocephalus, extra-axial collection or mass lesion/mass effect. There is generalized atrophy that has progressed since 2015. Mild to moderate chronic small vessel ischemic type change that is also progressed. Small sulcal calcification along the right parietal convexity, stable. Vascular: No hyperdense vessel or unexpected calcification. Skull: Generalized demineralization. No acute or aggressive finding. Sinuses/Orbits: Bilateral cataract resection.  No acute finding. Other: These results were called by telephone at the time of interpretation on 01/22/2018 at 3:54 pm to Dr.  Rudene Re , who verbally acknowledged these results. ASPECTS [facility] Stroke Program Early CT Score) - Ganglionic level infarction (caudate, lentiform nuclei, internal capsule, insula, M1-M3 cortex): 7 - Supraganglionic infarction (M4-M6 cortex): 3 Total score (0-10 with 10 being normal): 10 IMPRESSION: Aging brain without acute finding. Electronically  Signed   By: Monte Fantasia M.D.   On: 01/22/2018 15:56      ____________________________________________   PROCEDURES  Procedure(s) performed: None Procedures Critical Care performed:  None ____________________________________________   INITIAL IMPRESSION / ASSESSMENT AND PLAN / ED COURSE  84 y.o. female with a history of peripheral vascular disease, carotid stenosis, COPD, anemia, HTN who presents for evaluation of an episode of sudden complete a aphasia lasting about 15 minutes which has now resolved however patient still has mild slurred speech. Otherwise neuro intact. Initial NIH stroke scale of 7 however patient receives points for bilateral lower extremity weakness which is chronic. Code stroke called. CT head negative. ASA ordered. Awaiting Neurology evaluation.    _________________________ 4:44 PM on 01/22/2018 -----------------------------------------  Patient evaluated by Teleneurology, now back to baseline with no further deficits. Neurology recommended admission for TIA evaluation. Will discuss with Hospitalist.   As part of my medical decision making, I reviewed the following data within the [place] History obtained from family, Nursing notes reviewed and incorporated, Labs reviewed , EKG interpreted , Old EKG reviewed, Old chart reviewed, Radiograph reviewed , Discussed with admitting physician , A consult was requested and obtained from this/these consultant(s) Neurology, Notes from prior ED visits and [place] Controlled Substance Database    Pertinent labs & imaging results that were available  during my care of the patient were reviewed by me and considered in my medical decision making (see chart for details).    ____________________________________________   FINAL CLINICAL IMPRESSION(S) / ED DIAGNOSES  Final diagnoses:  TIA (transient ischemic attack)      NEW MEDICATIONS STARTED DURING THIS VISIT:  ED Discharge Orders    None       Note:  This document was prepared using Dragon voice recognition software and may include unintentional dictation errors.    Alfred Levins, [place], MD 01/22/18 [phone]

## 2018-01-22 NOTE — ED Notes (Signed)
Neurology on at bedside at this time

## 2018-01-22 NOTE — ED Notes (Signed)
Family at bedside. 

## 2018-01-22 NOTE — Progress Notes (Signed)
[facility] TeleNeurology Consult Services  Impression:  Stroke, patient with slurred speech.  Small vessel ischemia vs TIA,    Not a tpa candidate due to: no acute findings on exam, no disability. The left leg weakness is chronic, present 3 months. Also hx of melena in the last 2 weeks.  Speech has completely resolved, at baseline per son who is at baseline.  Symptoms not consistent with LVO therefore will not need CTA, not likely to require NIR.    Differential Diagnosis:   1. Cardioembolic stroke  2. Small vessel disease/lacune  3. Thromboembolic, artery-to-artery mechanism  4. Hypercoagulable state-related infarct  5. Transient ischemic attack  6. Thrombotic mechanism, large artery disease   Comments:   [facility] contacted: 15:53 [facility] at bedside: 15:58 NIHSS assessment time: 16:16  Recommendations:  -MRI brain -continue plavix if no contraindications -MRA head and neck -telemetry monitoring  Inpatient neurology consultation Inpatient stroke evaluation as per Neurology/ Internal Medicine Discussed with ED MD Please call with questions  -----------------------------------------------------------------------------------------  CC  History of Present Illness   Patient is a  84 yr old who presents to the hospital from a skilled living facility after an episode of aphasia that began around 2:25pm. She has a three months history of left calf pain and weakness in the left leg. Per patient she was trying to take a nap and she felt as if "everything got dark around her". She had company prior to that and she was tired. When the aid came in to check on her, she realized she could not speak. When EMS arrived, her speech came back. It lasted 15 minutes. No focal weakness, numbness or visual changes. Per son who is at bedside she has no dysarthria and her speech is normal.  In February she was hospitalized for UTI, sepsis or pneumonia She denies any recent  surgeries, procedures, +GI bleed, Had melena in February 7th.  She had vascular surgery in December 15 and 19th fem pop.  PMH: PVD, HTN, no Afib,  Only on Plavix only.   Diagnostic: CT brain: aging no acute findings. Generalized atrophy progressed since 2015, chronic small vessel disease.  BP wnl  Exam: 1a- LOC: Keenly responsive - 0   1b- LOC questions: Answers both questions correctly - 0     1c- LOC commands- Performs both tasks correctly- 0     2- Gaze: Normal; no gaze paresis or gaze deviation - 0     3- Visual Fields: normal, no Visual field deficit - 0     4- Facial movements: no facial palsy - 0     5- Upper limb motor - no drift -0     6- Lower limb motor - LLE drift - 2      7- Limb Coordination: absent ataxia - 0      8- Sensory : No  sensory loss - 0     9- Language - No aphasia - 0      10- Speech - no dysarthria -0     11- Neglect / Extinction - none found -0     NIHSS score   2    Medical Decision Making:  - Extensive number of diagnosis or management options are considered above.   - Extensive amount of complex data reviewed.   - High risk of complication and/or morbidity or mortality are associated with differential diagnostic considerations above.  - There may be Uncertain outcome and increased probability of prolonged functional impairment or high probability of severe  prolonged functional impairment associated with some of these differential diagnosis.  Medical Data Reviewed:  1.Data reviewed include clinical labs, radiology,  Medical Tests;   2.Tests results discussed w/performing or interpreting physician;   3.Obtaining/reviewing old medical records;  4.Obtaining case history from another source;  5.Independent review of image, tracing or specimen.    Patient was informed the Neurology Consult would happen via telehealth (remote video) and consented to receiving care in this manner.

## 2018-01-22 NOTE — Code Documentation (Signed)
Pt arrives via EMS from [facility], pt states she was tired and wanting to take a nap and then suddenly everything became "dim" around her and states an aid came in to check on her and she couldn't talk. Per pt she "got her words back while in the EMS truck", upon arrival to ER pt was back to baseline per RN, then upon EDP eval dysarthria noted, code stroke activated, pt taken from room 11 to CT for non-contrast CT then back to room 11 for tele-neuro consult, NIHSS 3, pt stated age wrong and states left leg weakness and pain for 3 months, pt states her mouth is dry which is why her speech is slurred, son arrived to bedside where he stated her speech was WNL, tx decision for no tPA given due to symptoms resolved, report off to [place] RN

## 2018-01-22 NOTE — H&P (Signed)
[facility] [place] [place] NAME: Dawn [name]    MR#:  [id number]  DATE OF BIRTH:  07-04-1928  DATE OF ADMISSION:  01/22/2018  PRIMARY CARE PHYSICIAN: Idelle Crouch, MD   REQUESTING/REFERRING PHYSICIAN:   CHIEF COMPLAINT:   Chief Complaint  Patient presents with  . Transient Ischemic Attack    HISTORY OF PRESENT ILLNESS: Dawn [name]  is a 84 y.o. female with a known history of skin cancer, hypertension, sciatica, urinary tract infection in the past, [place] virus diarrhea in the past was referred from [facility] facility for slurred speech.  Patient this morning and was not able to speak and had trouble articulating words.  She was referred to the emergency room where she was worked up with a CT head which showed no acute abnormality.  Code stroke was called and telemetry neuro consult was done.  The left leg weakness that is been there for the patient is chronic for more than 3 months.  Patient has been off Plavix for more than 2 weeks after she was treated for bloody diarrhea secondary to [place] virus.  She was on baby aspirin.  Speech has improved after presentation to the emergency room.  No complaints of any chest pain.  No history of fall or head injury.  Has generalized weakness.  PAST MEDICAL HISTORY:   Past Medical History:  Diagnosis Date  . Cancer ([place])    skin cancer  . 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 Hypertension   . Sciatica     PAST SURGICAL HISTORY:  Past Surgical History:  Procedure Laterality Date  . ABDOMINAL HYSTERECTOMY    . APPENDECTOMY    . CATARACT EXTRACTION, BILATERAL    . CHOLECYSTECTOMY    . ENDARTERECTOMY FEMORAL Right 04/19/2017   Procedure: ENDARTERECTOMY COMMON FEMORAL ARTERY, PROFUNDA  FEMORIS ARTERY, and  SUPERFICIAL FEMORAL ARTERY;  Surgeon: Algernon Huxley, MD;  Location: [facility] ORS;  Service: Vascular;  Laterality: Right;  . ENDARTERECTOMY  FEMORAL Left 11/14/2017   Procedure: ENDARTERECTOMY FEMORAL;  Surgeon: Algernon Huxley, MD;  Location: [facility] ORS;  Service: Vascular;  Laterality: Left;  . HIP SURGERY Bilateral   . LOWER EXTREMITY ANGIOGRAM Right 04/19/2017   Procedure: LOWER EXTREMITY ANGIOGRAM WITH SUPERFICIAL FEMORAL ARTERY STENT PLACEMENT;  Surgeon: Algernon Huxley, MD;  Location: [facility] ORS;  Service: Vascular;  Laterality: Right;  . LOWER EXTREMITY ANGIOGRAPHY Right 04/18/2017   Procedure: Lower Extremity Angiography;  Surgeon: Algernon Huxley, MD;  Location: [place] [facility];  Service: Cardiovascular;  Laterality: Right;  . LOWER EXTREMITY ANGIOGRAPHY Left 07/12/2017   Procedure: Lower Extremity Angiography;  Surgeon: Algernon Huxley, MD;  Location: [place] [facility];  Service: Cardiovascular;  Laterality: Left;  . LOWER EXTREMITY ANGIOGRAPHY Left 11/09/2017   Procedure: LOWER EXTREMITY ANGIOGRAPHY;  Surgeon: Algernon Huxley, MD;  Location: [place] [facility];  Service: Cardiovascular;  Laterality: Left;  . LOWER EXTREMITY INTERVENTION  07/12/2017   Procedure: Lower Extremity Intervention;  Surgeon: Algernon Huxley, MD;  Location: [place] [facility];  Service: Cardiovascular;;  . LOWER EXTREMITY INTERVENTION  11/09/2017   Procedure: LOWER EXTREMITY INTERVENTION;  Surgeon: Algernon Huxley, MD;  Location: [place] [facility];  Service: Cardiovascular;;  . RENAL ANGIOGRAPHY N/A 06/21/2017   Procedure: Renal Angiography;  Surgeon: Algernon Huxley, MD;  Location: [place] [facility];  Service: Cardiovascular;  Laterality: N/A;  .  SHOULDER SURGERY Bilateral   . TOTAL HIP ARTHROPLASTY Bilateral     SOCIAL HISTORY:  Social History   Tobacco Use  . Smoking status: Former Smoker    Types: Cigarettes    Last attempt to quit: 1980    Years since quitting: 39.1  . Smokeless tobacco: Never Used  Substance Use Topics  . Alcohol use: No    FAMILY HISTORY:  Family History  Problem Relation Age of Onset  . Diabetes Father   . Heart disease  Father     DRUG ALLERGIES:  Allergies  Allergen Reactions  . Percocet [Oxycodone-Acetaminophen] Itching  . Percodan [Oxycodone-Aspirin] Itching    REVIEW OF SYSTEMS:   CONSTITUTIONAL: No fever, generalized weakness.  EYES: No blurred or double vision.  EARS, NOSE, AND THROAT: No tinnitus or ear pain.  RESPIRATORY: No cough, shortness of breath, wheezing or hemoptysis.  CARDIOVASCULAR: No chest pain, orthopnea, edema.  GASTROINTESTINAL: No nausea, vomiting, diarrhea or abdominal pain.  GENITOURINARY: No dysuria, hematuria.  ENDOCRINE: No polyuria, nocturia,  HEMATOLOGY: No anemia, easy bruising or bleeding SKIN: No rash or lesion. MUSCULOSKELETAL: No joint pain or arthritis.   NEUROLOGIC: No tingling, numbness, weakness.  Difficulty in speech PSYCHIATRY: No anxiety or depression.   MEDICATIONS AT HOME:  Prior to Admission medications   Medication Sig Start Date End Date Taking? Authorizing Provider  acidophilus (RISAQUAD) CAPS capsule Take 1 capsule by mouth daily. 11/23/17  Yes Algernon Huxley, MD  amLODipine (NORVASC) 10 MG tablet Take 1 tablet (10 mg total) by mouth daily. 11/23/17  Yes Algernon Huxley, MD  aspirin EC 81 MG tablet Take 81 mg by mouth daily.   Yes [provider]  atorvastatin (LIPITOR) 20 MG tablet TAKE 1 TABLET BY MOUTH ONCE DAILY AT  6PM 08/29/17  Yes Dew, Erskine Squibb, MD  Cholecalciferol (VITAMIN D3) 400 units CAPS Take 400 Units by mouth daily.   Yes [provider]  cloNIDine (CATAPRES) 0.1 MG tablet Take 1 tablet (0.1 mg total) by mouth 2 (two) times daily. Patient taking differently: Take 0.1 mg by mouth 2 (two) times daily.  06/08/16  Yes Daymon Larsen, MD  Cyanocobalamin (B-12) 5000 MCG CAPS Take 5,000 mcg by mouth daily.    Yes [provider]  guaiFENesin (MUCINEX) 600 MG 12 hr tablet Take 600 mg by mouth 2 (two) times daily. 01/16/18 01/26/18 Yes [provider]  guaifenesin (ROBITUSSIN) 100 MG/5ML syrup Take 400 mg by  mouth 3 (three) times daily as needed for cough.   Yes [provider]  ipratropium-albuterol (DUONEB) 0.5-2.5 (3) MG/3ML SOLN Take 3 mLs by nebulization every 6 (six) hours as needed. Patient taking differently: Take 3 mLs by nebulization every 6 (six) hours as needed. For shortness of breath 01/11/18  Yes Demetrios Loll, MD  ketorolac (ACULAR) 0.4 % SOLN Place 1 drop into the left eye 3 (three) times daily.    Yes [provider]  Multiple Minerals-Vitamins (LEG-GESIC PO) Take 0.5 tablets by mouth at bedtime as needed. For restless leg syndrome (legatrin pm)   Yes [provider]  ondansetron (ZOFRAN-ODT) 4 MG disintegrating tablet Take 4 mg by mouth every 6 (six) hours as needed for nausea.   Yes [provider]  pantoprazole (PROTONIX) 40 MG tablet Take 1 tablet (40 mg total) by mouth 2 (two) times daily before a meal. 01/11/18  Yes Demetrios Loll, MD  predniSONE (DELTASONE) 10 MG tablet Take 20 mg by mouth daily. 01/22/18 01/24/18 Yes [provider]  senna-docusate (SENOKOT-S) 8.6-50 MG tablet Take 1 tablet by mouth 2 (two) times daily as needed for mild constipation. 01/14/18  Yes Wieting, Richard, MD  traMADol (ULTRAM) 50 MG tablet Take 1 tablet (50 mg total) by mouth every 6 (six) hours as needed. Patient taking differently: Take 50 mg by mouth every 6 (six) hours as needed for moderate pain.  01/11/18  Yes Demetrios Loll, MD  tretinoin (RETIN-A) 0.05 % cream Apply 1 application topically every other day. EVERY OTHER NIGHT   Yes [provider]  acetaminophen (TYLENOL) 325 MG tablet Take 2 tablets (650 mg total) by mouth every 6 (six) hours as needed for mild pain (or Fever >/= 101). 01/14/18   Loletha Grayer, MD  Hydrocortisone (GERHARDT'S BUTT CREAM) CREA Apply 1 application topically 2 (two) times daily. Patient not taking: Reported on 01/22/2018 01/11/18   Demetrios Loll, MD      PHYSICAL EXAMINATION:   VITAL SIGNS: Blood pressure (!) 145/58, pulse 85,  temperature 98.1 F (36.7 C), temperature source Oral, resp. rate (!) 21, SpO2 99 %.  GENERAL:  84 y.o.-year-old patient lying in the bed with no acute distress.  EYES: Pupils equal, round, reactive to light and accommodation. No scleral icterus. Extraocular muscles intact.  HEENT: Head atraumatic, normocephalic. Oropharynx dry and nasopharynx clear.  NECK:  Supple, no jugular venous distention. No thyroid enlargement, no tenderness.  LUNGS: Normal breath sounds bilaterally, no wheezing, rales,rhonchi or crepitation. No use of accessory muscles of respiration.  CARDIOVASCULAR: S1, S2 normal. No murmurs, rubs, or gallops.  ABDOMEN: Soft, nontender, nondistended. Bowel sounds present. No organomegaly or mass.  EXTREMITIES: No pedal edema, cyanosis, or clubbing.  NEUROLOGIC: Cranial nerves II through XII are intact. Muscle strength 5/5 in all extremities. Sensation intact. Gait not checked.  Had slurred speech which improved PSYCHIATRIC: The patient is alert and oriented x 3.  SKIN: No obvious rash, lesion, or ulcer.   LABORATORY PANEL:   CBC Recent Labs  Lab 01/22/18 1529  WBC 7.6  HGB 10.0*  HCT 31.4*  PLT 338  MCV 84.7  MCH 27.0  MCHC 31.9*  RDW 16.7*   ------------------------------------------------------------------------------------------------------------------  Chemistries  Recent Labs  Lab 01/22/18 1529  NA 138  K 3.1*  CL 101  CO2 25  GLUCOSE 113*  BUN 18  CREATININE 0.77  CALCIUM 8.6*   ------------------------------------------------------------------------------------------------------------------ estimated creatinine clearance is 64.1 mL/min (by C-G formula based on SCr of 0.77 mg/dL). ------------------------------------------------------------------------------------------------------------------ No results for input(s): TSH, T4TOTAL, T3FREE, THYROIDAB in the last 72 hours.  Invalid input(s): FREET3   Coagulation profile Recent Labs  Lab  01/22/18 1554  INR 0.97   ------------------------------------------------------------------------------------------------------------------- No results for input(s): DDIMER in the last 72 hours. -------------------------------------------------------------------------------------------------------------------  Cardiac Enzymes Recent Labs  Lab 01/22/18 1554  TROPONINI 0.03*   ------------------------------------------------------------------------------------------------------------------ Invalid input(s): POCBNP  ---------------------------------------------------------------------------------------------------------------  Urinalysis    Component Value Date/Time   COLORURINE YELLOW (A) 01/22/2018 1700   APPEARANCEUR CLOUDY (A) 01/22/2018 1700   LABSPEC 1.011 01/22/2018 1700   PHURINE 6.0 01/22/2018 1700   GLUCOSEU NEGATIVE 01/22/2018 1700   HGBUR NEGATIVE 01/22/2018 1700   BILIRUBINUR NEGATIVE 01/22/2018 1700   KETONESUR NEGATIVE 01/22/2018 1700   PROTEINUR NEGATIVE 01/22/2018 1700   NITRITE NEGATIVE 01/22/2018 1700   LEUKOCYTESUR LARGE (A) 01/22/2018 1700     RADIOLOGY: Ct Head Code Stroke Wo Contrast  Result Date: 01/22/2018 CLINICAL DATA:  Code stroke. Episode of aphasia from 225-240 pm today. EXAM: CT HEAD WITHOUT CONTRAST TECHNIQUE: Contiguous axial images were  obtained from the base of the skull through the vertex without intravenous contrast. COMPARISON:  08/25/2014 FINDINGS: Brain: No evidence of acute infarction, hemorrhage, hydrocephalus, extra-axial collection or mass lesion/mass effect. There is generalized atrophy that has progressed since 2015. Mild to moderate chronic small vessel ischemic type change that is also progressed. Small sulcal calcification along the right parietal convexity, stable. Vascular: No hyperdense vessel or unexpected calcification. Skull: Generalized demineralization. No acute or aggressive finding. Sinuses/Orbits: Bilateral cataract  resection.  No acute finding. Other: These results were called by telephone at the time of interpretation on 01/22/2018 at 3:54 pm to Dr. Rudene Re , who verbally acknowledged these results. ASPECTS [facility] Stroke Program Early CT Score) - Ganglionic level infarction (caudate, lentiform nuclei, internal capsule, insula, M1-M3 cortex): 7 - Supraganglionic infarction (M4-M6 cortex): 3 Total score (0-10 with 10 being normal): 10 IMPRESSION: Aging brain without acute finding. Electronically Signed   By: Monte Fantasia M.D.   On: 01/22/2018 15:56    EKG: Orders placed or performed during the hospital encounter of 01/22/18  . EKG 12-Lead  . EKG 12-Lead  . ED EKG  . ED EKG    IMPRESSION AND PLAN: 84 year old elderly female patient with history of hypertension, sciatica, skin cancer, urinary tract infection, no rotavirus diarrhea in the past presented to the emergency room with difficulty in speech.  Speech is improved after presentation to the emergency room.  Admitting diagnosis 1.  Transient ischemic attack 2.  Hypertension 3.  Hypokalemia 4.  Dysarthria Treatment plan Admit patient to telemetry observation bed MRI brain and MRA brain in the more to assess for CVA Neurology consultation Start patient on aspirin 325 mg orally daily Neurochecks for 24 hours DVT prophylaxis sequential compression device to lower extremities Replace potassium  All the records are reviewed and case discussed with ED provider. Management plans discussed with the patient, family and they are in agreement.  CODE STATUS:FULL CODE Code Status History    Date Active Date Inactive Code Status Order ID Comments User Context   01/03/2018 15:21 01/14/2018 19:54 Full Code [id number]  Gorden Harms, MD Inpatient   11/09/2017 11:31 11/22/2017 19:13 Full Code [id number]  Leonides Schanz Inpatient   11/09/2017 10:19 11/09/2017 11:22 Full Code [id number]  Sela Hua, PA-C Outpatient    11/09/2017 10:19 11/09/2017 10:19 Full Code [id number]  Algernon Huxley, MD Outpatient   07/12/2017 10:05 07/12/2017 16:56 Full Code [id number]  Algernon Huxley, MD Inpatient   06/21/2017 11:25 06/21/2017 16:22 Full Code [id number]  Algernon Huxley, MD Inpatient   04/18/2017 11:50 04/18/2017 13:02 Full Code [id number]  Algernon Huxley, MD Inpatient   04/18/2017 11:04 04/18/2017 11:50 Full Code [id number]  Algernon Huxley, MD Inpatient    Advance Directive Documentation     Most Recent Value  Type of Advance Directive  Living will  Pre-existing out of facility DNR order (yellow form or pink MOST form)  No data  "MOST" Form in Place?  No data       TOTAL TIME TAKING CARE OF THIS PATIENT: 52 minutes.    Saundra Shelling M.D on 01/22/2018 at 6:59 PM  Between 7am to 6pm - Pager - [phone]  After 6pm go to [email or web address] - password [email or web address]  Office  [phone]  CC: Primary care physician; Idelle Crouch, MD

## 2018-01-23 ENCOUNTER — Other Ambulatory Visit: Payer: Medicare Other

## 2018-01-23 ENCOUNTER — Observation Stay: Payer: Medicare Other

## 2018-01-23 ENCOUNTER — Observation Stay
Admit: 2018-01-23 | Discharge: 2018-01-23 | Disposition: A | Discharge status: Home / Self Care | Payer: Medicare Other | Attending: Internal Medicine | Admitting: Internal Medicine

## 2018-01-23 ENCOUNTER — Encounter: Payer: Self-pay | Admitting: Internal Medicine

## 2018-01-23 DIAGNOSIS — I639 Cerebral infarction, unspecified: Secondary | ICD-10-CM | POA: Diagnosis not present

## 2018-01-23 LAB — ECHOCARDIOGRAM COMPLETE
Height: 63 in
WEIGHTICAEL: 1531.2 [oz_av]

## 2018-01-23 LAB — LIPID PANEL
Cholesterol: 110 mg/dL (ref 0–200)
HDL: 39 mg/dL — ABNORMAL LOW (ref >40)
LDL Cholesterol: 57 mg/dL (ref 0–99)
Total CHOL/HDL Ratio: 2.8 RATIO
Triglycerides: 72 mg/dL (ref <150)
VLDL: 14 mg/dL (ref 0–40)

## 2018-01-23 MED ORDER — CLOPIDOGREL BISULFATE 75 MG PO TABS
75.0000 mg | ORAL_TABLET | Freq: Every day | ORAL | Status: DC
  Administered 2018-01-23: 75 mg via ORAL
  Filled 2018-01-23: qty 1

## 2018-01-23 MED ORDER — CEPHALEXIN 250 MG PO CAPS: 250.0000 mg | ORAL_CAPSULE | Freq: Three times a day (TID) | ORAL | 0 refills | Status: AC

## 2018-01-23 MED ORDER — SODIUM CHLORIDE 0.9 % IV SOLN
1.0000 g | INTRAVENOUS | Status: DC
  Administered 2018-01-23: 03:00:00 1 g via INTRAVENOUS
  Filled 2018-01-23 (×2): qty 10

## 2018-01-23 MED ORDER — CLOPIDOGREL BISULFATE 75 MG PO TABS: 75.0000 mg | ORAL_TABLET | Freq: Every day | ORAL | Status: DC

## 2018-01-23 NOTE — NC FL2 (Signed)
[place] LEVEL OF CARE SCREENING TOOL     IDENTIFICATION  Patient Name: Dawn [name] Birthdate: 1928-02-21 Sex: female Admission Date (Current Location): 01/22/2018  [place] and [place] Number:  Engineering geologist and Address:  [facility], [address], [place], [place] [address]      Provider Number: [id number]  Attending Physician Name and Address:  Hillary Bow, MD  Relative Name and Phone Number:  Dawn [name]  [phone]  [phone]     Current Level of Care: Hospital Recommended Level of Care: [place] Prior Approval Number:    Date Approved/Denied:   PASRR Number: [id number]  Discharge Plan: SNF    Current Diagnoses: Patient Active Problem List   Diagnosis Date Noted  . TIA (transient ischemic attack) 01/22/2018  . Pressure injury of skin 01/04/2018  . Sepsis ([place]) 01/03/2018  . Protein-calorie malnutrition, severe 11/14/2017  . Atherosclerosis of native arteries of extremity with rest pain ([place]) 11/09/2017  . Ischemic leg 11/09/2017  . Atherosclerotic peripheral vascular disease with rest pain ([place]) 11/09/2017  . Atypical chest pain 09/21/2017  . Renal artery stenosis ([place]) 06/15/2017  . Hyperlipidemia 05/09/2017  . Essential hypertension 10/17/2016  . Carotid stenosis 10/17/2016  . PVD (peripheral vascular disease) ([place]) 10/17/2016  . B12 deficiency 07/19/2016  . Vitamin D deficiency, unspecified 07/19/2016  . History of right hip replacement 11/04/2015  . Aftercare following bilateral hip joint replacement surgery 11/05/2014  . SCC (squamous cell carcinoma) 05/21/2014  . COPD with asthma ([place]) 03/16/2014  . S/P hip replacement 10/03/2012    Orientation RESPIRATION BLADDER Height & Weight     Self, Place  O2(3L) Incontinent Weight: 95 lb 11.2 oz (43.4 kg) Height:  5\' 3"  (034 cm)  BEHAVIORAL SYMPTOMS/MOOD NEUROLOGICAL BOWEL NUTRITION STATUS      Continent (Dysphagia 3)   AMBULATORY STATUS COMMUNICATION OF NEEDS Skin   Limited Assist Verbally PU Stage and Appropriate Care   PU Stage 2 Dressing: (PRN dressing change)                   Personal Care Assistance Level of Assistance  Bathing, Feeding, Dressing Bathing Assistance: Limited assistance Feeding assistance: Independent Dressing Assistance: Limited assistance     Functional Limitations Info  Sight, Hearing Sight Info: Impaired Hearing Info: Impaired      SPECIAL CARE FACTORS FREQUENCY  PT (By licensed PT)     PT Frequency: 5x a week              Contractures Contractures Info: Not present    Additional Factors Info  Code Status, Allergies Code Status Info: Full Code Allergies Info: PERCOCET OXYCODONE-ACETAMINOPHEN, PERCODAN OXYCODONE-ASPIRIN            Current Medications (01/23/2018):  This is the current hospital active medication list Current Facility-Administered Medications  Medication Dose Route Frequency Provider Last Rate Last Dose  . acetaminophen (TYLENOL) tablet 650 mg  650 mg Oral Q4H PRN Saundra Shelling, MD       Or  . acetaminophen (TYLENOL) solution 650 mg  650 mg Per Tube Q4H PRN Saundra Shelling, MD   650 mg at 01/22/18 2144   Or  . acetaminophen (TYLENOL) suppository 650 mg  650 mg Rectal Q4H PRN Saundra Shelling, MD      . acidophilus (RISAQUAD) capsule 1 capsule  1 capsule Oral Daily Pyreddy, Pavan, MD   1 capsule at 01/23/18 1051  . amLODipine (NORVASC) tablet 10 mg  10 mg Oral Daily  Saundra Shelling, MD   10 mg at 01/23/18 1051  . aspirin suppository 300 mg  300 mg Rectal Daily Pyreddy, Pavan, MD       Or  . aspirin tablet 325 mg  325 mg Oral Daily Pyreddy, Pavan, MD   325 mg at 01/23/18 1051  . atorvastatin (LIPITOR) tablet 20 mg  20 mg Oral q1800 Pyreddy, Reatha Harps, MD      . clopidogrel (PLAVIX) tablet 75 mg  75 mg Oral Daily Sudini, Srikar, MD      . ipratropium-albuterol (DUONEB) 0.5-2.5 (3) MG/3ML nebulizer solution 3 mL  3 mL Nebulization Q6H PRN  Pyreddy, Pavan, MD      . pantoprazole (PROTONIX) EC tablet 40 mg  40 mg Oral BID AC Pyreddy, Pavan, MD   40 mg at 01/23/18 1051  . predniSONE (DELTASONE) tablet 20 mg  20 mg Oral Daily Pyreddy, Pavan, MD   20 mg at 01/23/18 1051  . senna-docusate (Senokot-S) tablet 1 tablet  1 tablet Oral BID PRN Saundra Shelling, MD   1 tablet at 01/22/18 2144     Discharge Medications: Please see discharge summary for a list of discharge medications.  Relevant Imaging Results:  Relevant Lab Results:   Additional Information SS# [id number]  Dawn [name]

## 2018-01-23 NOTE — Evaluation (Addendum)
Clinical/Bedside Swallow Evaluation Patient Details  Name: Dawn [name] MRN: [id number] Date of Birth: [birth year]  Today's Date: 01/23/2018 Time: SLP Start Time (ACUTE ONLY): 69 SLP Stop Time (ACUTE ONLY): 1120 SLP Time Calculation (min) (ACUTE ONLY): 60 min  Past Medical History:  Past Medical History:  Diagnosis Date  . Cancer ([place])    skin cancer  . 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 Hypertension   . Sciatica    Past Surgical History:  Past Surgical History:  Procedure Laterality Date  . ABDOMINAL HYSTERECTOMY    . APPENDECTOMY    . CATARACT EXTRACTION, BILATERAL    . CHOLECYSTECTOMY    . ENDARTERECTOMY FEMORAL Right 04/19/2017   Procedure: ENDARTERECTOMY COMMON FEMORAL ARTERY, PROFUNDA  FEMORIS ARTERY, and  SUPERFICIAL FEMORAL ARTERY;  Surgeon: Algernon Huxley, MD;  Location: [facility];  Service: Vascular;  Laterality: Right;  . ENDARTERECTOMY FEMORAL Left 11/14/2017   Procedure: ENDARTERECTOMY FEMORAL;  Surgeon: Algernon Huxley, MD;  Location: [facility];  Service: Vascular;  Laterality: Left;  . HIP SURGERY Bilateral   . LOWER EXTREMITY ANGIOGRAM Right 04/19/2017   Procedure: LOWER EXTREMITY ANGIOGRAM WITH SUPERFICIAL FEMORAL ARTERY STENT PLACEMENT;  Surgeon: Algernon Huxley, MD;  Location: [facility];  Service: Vascular;  Laterality: Right;  . LOWER EXTREMITY ANGIOGRAPHY Right 04/18/2017   Procedure: Lower Extremity Angiography;  Surgeon: Algernon Huxley, MD;  Location: [facility];  Service: Cardiovascular;  Laterality: Right;  . LOWER EXTREMITY ANGIOGRAPHY Left 07/12/2017   Procedure: Lower Extremity Angiography;  Surgeon: Algernon Huxley, MD;  Location: [facility];  Service: Cardiovascular;  Laterality: Left;  . LOWER EXTREMITY ANGIOGRAPHY Left 11/09/2017   Procedure: LOWER EXTREMITY ANGIOGRAPHY;  Surgeon: Algernon Huxley, MD;  Location: [facility];  Service: Cardiovascular;  Laterality: Left;  .  LOWER EXTREMITY INTERVENTION  07/12/2017   Procedure: Lower Extremity Intervention;  Surgeon: Algernon Huxley, MD;  Location: [facility];  Service: Cardiovascular;;  . LOWER EXTREMITY INTERVENTION  11/09/2017   Procedure: LOWER EXTREMITY INTERVENTION;  Surgeon: Algernon Huxley, MD;  Location: [facility];  Service: Cardiovascular;;  . RENAL ANGIOGRAPHY N/A 06/21/2017   Procedure: Renal Angiography;  Surgeon: Algernon Huxley, MD;  Location: [facility];  Service: Cardiovascular;  Laterality: N/A;  . SHOULDER SURGERY Bilateral   . TOTAL HIP ARTHROPLASTY Bilateral    HPI:  Pt is a 84 y.o. female with a known h/o multiple medical issues including Protein-calorie malnutrition, severe who has been in Rehab status post vascular procedure to her lower extremities for peripheral vascular disease. A recent admission for nausea, vomiting, diarrhea, generalized weakness, fatigue, altered mental status, UTI, sepsis and acute dehydration, acute lactic acidosis earlier this month. Pt last BSE recommended a Dysphagia 3/2 w/ NECTAR liquids. This admission, pt initially admitted w/ weakness and slurred speech which has improved - pt is conversing in conversation at her baseline per Son.    Assessment / Plan / Recommendation Clinical Impression  Pt appears to present w/ oropharyngeal phase dysphagia - this is baseline for pt since previous admissions stemming from Dec. 2018. Pt has been on a Dysphagia 3(w/ some minced foods) and NECTAR consistency liquids. Pt has been on a modified diet during all of her Rehab d/t dysphagia per Son, and per Rehab. Pt has been improving overall using the modified diet per Son and is looking to return to Rehab.  Pt consumed trials of purees, soft solids and Nectar liquids via Cup w/ no immediate, overt s/s of aspiration noted(mild throat clearing post drinking and talking x1); no significant decline in vocal quality or respiratory status during/post trials. Swallows were  all timely, and Oral phase appeared grossly wfl w/ oral intake - appropriate oral clearing w/ foods/liquids. Pt does wear dentures/partial and benefits from alternating foods/liquids and time for f/u, dry swallows during her meal. Discussed w/ Son and pt the need for follow through w/ aspiration precautions including Upright Positioning especially when in bed eating meals(pillows down low behind back); NOT using straws as pt can hold cup independently to drink liquids; dysphagia during extended illness and risk for airway compromise thus pulmonary decline; cut/minced food diet consistency for easier oral intake and reducing exertion overall; food options and preparation w/ condiments. Recommended rest breaks to lessen fatigue/SOB; and use of Pills in Puree - Whole as able but crushed if necessary for easier oral-esophageal clearing. As pt appears at her baseline since recent admission and pt is returning to SNF for Rehab, no further skilled ST services indicated at this time. NSG to reconsult if any decline in status while admitted.  SLP Visit Diagnosis: Dysphagia, oropharyngeal phase (R13.12)    Aspiration Risk  Mild aspiration risk;Moderate aspiration risk    Diet Recommendation  Dysphagia level 3(w/ some level 2 MINCED foods); NECTAR consistency liquids via CUP. Strict aspiration precautions. Feeding assistance at meals.   Medication Administration: Whole meds with puree(but crushed in puree if needed for safer swallowing)    Other  Recommendations Recommended Consults: (Dietician f/u) Oral Care Recommendations: Oral care BID;Staff/trained caregiver to provide oral care;Patient independent with oral care Other Recommendations: Order thickener from pharmacy;Prohibited food (jello, ice cream, thin soups);Remove water pitcher;Have oral suction available   Follow up Recommendations Skilled Nursing facility      Frequency and Duration (TBD)  (TBD)       Prognosis Prognosis for Safe Diet  Advancement: Fair Barriers to Reach Goals: Cognitive deficits;Time post onset      Swallow Study   General Date of Onset: 01/22/18 HPI: Pt is a 84 y.o. female with a known h/o multiple medical issues including Protein-calorie malnutrition, severe who has been in Rehab status post vascular procedure to her lower extremities for peripheral vascular disease. A recent admission for nausea, vomiting, diarrhea, generalized weakness, fatigue, altered mental status, UTI, sepsis and acute dehydration, acute lactic acidosis earlier this month. Pt last BSE recommended a Dysphagia 3/2 w/ NECTAR liquids. This admission, pt initially admitted w/ weakness and slurred speech which has improved - pt is conversing in conversation at her baseline per Son.  Type of Study: Bedside Swallow Evaluation Previous Swallow Assessment: Previous assessment in December 2018, pt was recommend to have Dys 3 diet w/thin liquids, but has since been downgraded to nectar thick liquids after admission to SNF. Last admission, pt was seen for eval and on 01/11/2018 w/ final recommendation to remain on Dysphagia 3 w/ minced meats; NECTAR liquids as her new baseline.  Diet Prior to this Study: Dysphagia 3 (soft);Dysphagia 2 (chopped);Nectar-thick liquids Temperature Spikes Noted: No(wbc 7.6) Respiratory Status: Nasal cannula(3 liters) History of Recent Intubation: No Behavior/Cognition: Alert;Cooperative;Pleasant mood;Distractible;Requires cueing Oral Cavity Assessment: Dry(min) Oral Care Completed by SLP: (family had been using mouth swabs) Oral Cavity - Dentition: Dentures, top;Dentures, bottom(partial) Vision: Functional for self-feeding Self-Feeding Abilities: Able to feed self;Needs assist;Needs set up;Total assist(d/t overall weakness) Patient Positioning: Upright in bed(needed positioning upright more) Baseline Vocal Quality:  Normal;Low vocal intensity(soft spoken) Volitional Cough: Strong;Congested Volitional Swallow: Able to  elicit    Oral/Motor/Sensory Function Overall Oral Motor/Sensory Function: Generalized oral weakness Facial ROM: Within Functional Limits Facial Symmetry: Within Functional Limits Lingual ROM: Within Functional Limits Lingual Symmetry: Within Functional Limits Lingual Strength: Reduced   Ice Chips Ice chips: Not tested   Thin Liquid Thin Liquid: Not tested    Nectar Thick Nectar Thick Liquid: Within functional limits(grossly - mild throat clear x1) Presentation: Cup;Self Fed(assisted; ~3-4 ozs)   Honey Thick Honey Thick Liquid: Not tested   Puree Puree: Within functional limits Presentation: Spoon;Self Fed(4 trials)   Solid   GO   Solid: Impaired Presentation: Self Fed;Spoon(3 trials) Oral Phase Impairments: Impaired mastication Oral Phase Functional Implications: (min more oral phase time) Pharyngeal Phase Impairments: (none)    Functional Assessment Tool Used: clinical judgement Functional Limitations: Swallowing Swallow Current Status ([id number]): At least 20 percent but less than 40 percent impaired, limited or restricted Swallow Goal Status [phone]): At least 20 percent but less than 40 percent impaired, limited or restricted Swallow Discharge Status [phone]): At least 20 percent but less than 40 percent impaired, limited or restricted    Orinda Kenner, MS, CCC-SLP Adaleen Hulgan 01/23/2018,11:40 AM

## 2018-01-23 NOTE — Discharge Summary (Addendum)
[place] [facility] [place] NAME: Dawn [name]    MR#:  [id number]  DATE OF BIRTH:  Nov 25, 1928  DATE OF ADMISSION:  01/22/2018 ADMITTING PHYSICIAN: Saundra Shelling, MD  DATE OF DISCHARGE: No discharge date for patient encounter.  PRIMARY CARE PHYSICIAN: Idelle Crouch, MD   ADMISSION DIAGNOSIS:  TIA (transient ischemic attack) [G45.9]  DISCHARGE DIAGNOSIS:  Active Problems:   TIA (transient ischemic attack)   SECONDARY DIAGNOSIS:   Past Medical History:  Diagnosis Date  . Cancer ([place])    skin cancer  . 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 Hypertension   . Sciatica      ADMITTING HISTORY  HISTORY OF PRESENT ILLNESS: Dawn [name]  is a 84 y.o. 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  Patient this morning and was not able to speak and had trouble articulating words.  She was referred to the emergency room where she was worked up with a CT head which showed no acute abnormality.  Code stroke was called and telemetry neuro consult was done.  The left leg weakness that is been there for the patient is chronic for more than 3 months.  Patient has been off Plavix for more than 2 weeks after she was treated for bloody diarrhea secondary to Alinda Sierras virus.  She was on baby aspirin.  Speech has improved after presentation to the emergency room.  No complaints of any chest pain.  No history of fall or head injury.  Has generalized weakness.    HOSPITAL COURSE:   * Acute left frontal CVA causing slurred speech- improving Carotids nothing acute ASA, Statin. Resumed plavix which was stopped 2 weeks back. Please stop if any blood in stool Neurology consulted Will be discharged SNF with echo normal for further PT/Speech  * HTN  * UTI keflex  for 3 days  Stable for discharge to SNF  Diet recommended at Discharge:  Dysphagia level 3(mech soft foods cut small), MINCED MEATS w/ Gravy to moisten; NECTAR consistency liquids. General aspiration precautions; assistance at meals. Pills in Puree.  CONSULTS OBTAINED:  Treatment Team:  Alexis Goodell, MD  DRUG ALLERGIES:   Allergies  Allergen Reactions  . Percocet [Oxycodone-Acetaminophen] Itching  . Percodan [Oxycodone-Aspirin] Itching    DISCHARGE MEDICATIONS:   Allergies as of 01/23/2018      Reactions   Percocet [oxycodone-acetaminophen] Itching   Percodan [oxycodone-aspirin] Itching      Medication List    STOP taking these medications   Gerhardt's butt cream Crea     TAKE these medications   acetaminophen 325 MG tablet Commonly known as:  TYLENOL Take 2 tablets (650 mg total) by mouth every 6 (six) hours as needed for mild pain (or Fever >/= 101).   acidophilus Caps capsule Take 1 capsule by mouth daily.   amLODipine 10 MG tablet Commonly known as:  NORVASC Take 1 tablet (10 mg total) by mouth daily.   aspirin EC 81 MG tablet Take 81 mg by mouth daily.   atorvastatin 20 MG tablet Commonly known as:  LIPITOR TAKE 1 TABLET BY MOUTH ONCE DAILY AT  6PM   B-12 5000 MCG Caps Take 5,000 mcg by mouth daily.   cephALEXin 250 MG capsule Commonly known as:  KEFLEX Take 1 capsule (250  mg total) by mouth 3 (three) times daily for 3 days.   cloNIDine 0.1 MG tablet Commonly known as:  CATAPRES Take 1 tablet (0.1 mg total) by mouth 2 (two) times daily.   clopidogrel 75 MG tablet Commonly known as:  PLAVIX Take 1 tablet (75 mg total) by mouth daily.   guaifenesin 100 MG/5ML syrup Commonly known as:  ROBITUSSIN Take 400 mg by mouth 3 (three) times daily as needed for cough.   guaiFENesin 600 MG 12 hr tablet Commonly known as:  MUCINEX Take 600 mg by mouth 2 (two) times daily.   ipratropium-albuterol 0.5-2.5 (3) MG/3ML Soln Commonly known as:   DUONEB Take 3 mLs by nebulization every 6 (six) hours as needed. What changed:  additional instructions   ketorolac 0.4 % Soln Commonly known as:  ACULAR Place 1 drop into the left eye 3 (three) times daily.   LEG-GESIC PO Take 0.5 tablets by mouth at bedtime as needed. For restless leg syndrome (legatrin pm)   ondansetron 4 MG disintegrating tablet Commonly known as:  ZOFRAN-ODT Take 4 mg by mouth every 6 (six) hours as needed for nausea.   pantoprazole 40 MG tablet Commonly known as:  PROTONIX Take 1 tablet (40 mg total) by mouth 2 (two) times daily before a meal.   predniSONE 10 MG tablet Commonly known as:  DELTASONE Take 20 mg by mouth daily.   senna-docusate 8.6-50 MG tablet Commonly known as:  Senokot-S Take 1 tablet by mouth 2 (two) times daily as needed for mild constipation.   traMADol 50 MG tablet Commonly known as:  ULTRAM Take 1 tablet (50 mg total) by mouth every 6 (six) hours as needed. What changed:  reasons to take this   tretinoin 0.05 % cream Commonly known as:  RETIN-A Apply 1 application topically every other day. EVERY OTHER NIGHT   Vitamin D3 400 units Caps Take 400 Units by mouth daily.       Today   VITAL SIGNS:  Blood pressure (!) 140/52, pulse 95, temperature 97.6 F (36.4 C), temperature source Oral, resp. rate 18, height 5\' 3"  (1.6 m), weight 43.4 kg (95 lb 11.2 oz), SpO2 100 %.  I/O:    Intake/Output Summary (Last 24 hours) at 01/23/2018 1050 Last data filed at 01/23/2018 0314 Gross per 24 hour  Intake 100 ml  Output -  Net 100 ml    PHYSICAL EXAMINATION:  Physical Exam  GENERAL:  84 y.o.-year-old patient lying in the bed with no acute distress. Frail LUNGS: Normal breath sounds bilaterally, no wheezing, rales,rhonchi or crepitation. No use of accessory muscles of respiration.  CARDIOVASCULAR: S1, S2 normal. No murmurs, rubs, or gallops.  ABDOMEN: Soft, non-tender, non-distended. NEUROLOGIC: Moves all 4  extremities. PSYCHIATRIC: The patient is alert and awake.  DATA REVIEW:   CBC Recent Labs  Lab 01/22/18 1529  WBC 7.6  HGB 10.0*  HCT 31.4*  PLT 338    Chemistries  Recent Labs  Lab 01/22/18 1529  NA 138  K 3.1*  CL 101  CO2 25  GLUCOSE 113*  BUN 18  CREATININE 0.77  CALCIUM 8.6*    Cardiac Enzymes Recent Labs  Lab 01/22/18 1554  TROPONINI 0.03*    Microbiology Results  Results for orders placed or performed during the hospital encounter of 01/03/18  Urine Culture     Status: Abnormal   Collection Time: 01/03/18 11:41 AM  Result Value Ref Range Status   Specimen Description   Final    URINE, RANDOM  Performed at [facility], [address] [place], [place] [address]    Special Requests   Final    NONE Performed at [facility], [address] [place], [place] [address]    Culture >=100,000 COLONIES/mL PROVIDENCIA RETTGERI (A)  Final   Report Status 01/05/2018 FINAL  Final   Organism ID, Bacteria PROVIDENCIA RETTGERI (A)  Final      Susceptibility   Providencia rettgeri - MIC*    AMPICILLIN RESISTANT Resistant     CEFAZOLIN >=64 RESISTANT Resistant     CEFTRIAXONE <=1 SENSITIVE Sensitive     CIPROFLOXACIN <=0.25 SENSITIVE Sensitive     GENTAMICIN <=1 SENSITIVE Sensitive     IMIPENEM 4 SENSITIVE Sensitive     NITROFURANTOIN 256 RESISTANT Resistant     TRIMETH/SULFA <=20 SENSITIVE Sensitive     AMPICILLIN/SULBACTAM >=32 RESISTANT Resistant     PIP/TAZO <=4 SENSITIVE Sensitive     * >=100,000 COLONIES/mL PROVIDENCIA RETTGERI  Blood Culture (routine x 2)     Status: None   Collection Time: 01/03/18  1:00 PM  Result Value Ref Range Status   Specimen Description BLOOD RFOA  Final   Special Requests   Final    BOTTLES DRAWN AEROBIC AND ANAEROBIC Blood Culture adequate volume   Culture   Final    NO GROWTH 5 DAYS Performed at [facility], [address] [place], [place] [address]    Report Status 01/08/2018 FINAL   Final  Blood Culture (routine x 2)     Status: None   Collection Time: 01/03/18  1:00 PM  Result Value Ref Range Status   Specimen Description BLOOD RFOA  Final   Special Requests   Final    BOTTLES DRAWN AEROBIC AND ANAEROBIC Blood Culture adequate volume   Culture   Final    NO GROWTH 5 DAYS Performed at [facility], [address] [place], [place] [address]    Report Status 01/08/2018 FINAL  Final  Gastrointestinal Panel by PCR , Stool     Status: Abnormal   Collection Time: 01/03/18  3:27 PM  Result Value Ref Range Status   Campylobacter species NOT DETECTED NOT DETECTED Final   Plesimonas shigelloides NOT DETECTED NOT DETECTED Final   Salmonella species NOT DETECTED NOT DETECTED Final   Yersinia enterocolitica NOT DETECTED NOT DETECTED Final   Vibrio species NOT DETECTED NOT DETECTED Final   Vibrio cholerae NOT DETECTED NOT DETECTED Final   Enteroaggregative E coli (EAEC) NOT DETECTED NOT DETECTED Final   Enteropathogenic E coli (EPEC) NOT DETECTED NOT DETECTED Final   Enterotoxigenic E coli (ETEC) NOT DETECTED NOT DETECTED Final   Shiga like toxin producing E coli (STEC) NOT DETECTED NOT DETECTED Final   Shigella/Enteroinvasive E coli (EIEC) NOT DETECTED NOT DETECTED Final   Cryptosporidium NOT DETECTED NOT DETECTED Final   Cyclospora cayetanensis NOT DETECTED NOT DETECTED Final   Entamoeba histolytica NOT DETECTED NOT DETECTED Final   Giardia lamblia NOT DETECTED NOT DETECTED Final   Adenovirus F40/41 NOT DETECTED NOT DETECTED Final   Astrovirus NOT DETECTED NOT DETECTED Final   Norovirus GI/GII DETECTED (A) NOT DETECTED Final    Comment: RESULT CALLED TO, READ BACK BY AND VERIFIED WITH: JANCY JOHNSTONE AT 1497 ON 01/03/2018 JJB    Rotavirus A NOT DETECTED NOT DETECTED Final   Sapovirus (I, II, IV, and V) NOT DETECTED NOT DETECTED Final    Comment: Performed at [facility], [address] [place], [place] [address]  MRSA  PCR Screening     Status:  None   Collection Time: 01/03/18  6:39 PM  Result Value Ref Range Status   MRSA by PCR NEGATIVE NEGATIVE Final    Comment:        The GeneXpert MRSA Assay (FDA approved for NASAL specimens only), is one component of a comprehensive MRSA colonization surveillance program. It is not intended to diagnose MRSA infection nor to guide or monitor treatment for MRSA infections. Performed at [facility], [address] [place], [place] [address]     RADIOLOGY:  Mr Herby Abraham Contrast  Result Date: 01/23/2018 CLINICAL DATA:  84 y/o F; TIA, initial examination. Episode of slurred speech. EXAM: MRI HEAD WITHOUT CONTRAST MRA HEAD WITHOUT CONTRAST TECHNIQUE: Multiplanar, multiecho pulse sequences of the brain and surrounding structures were obtained without intravenous contrast. Angiographic images of the head were obtained using MRA technique without contrast. COMPARISON:  01/22/2018 CT head.  03/13/2012 MRI head. FINDINGS: MRI HEAD FINDINGS Brain: 11 mm focus of reduced diffusion in left posterior frontal subcortical white matter (series 101, image 21 and series 6, image 21). No associated hemorrhage or mass effect. Numerousnonspecific foci of T2 FLAIR hyperintense signal abnormality in subcortical and periventricular white matter as well as pons are compatible withmoderatechronic microvascular ischemic changes for age. Moderatebrain parenchymal volume loss. Findings are progressed from 2013. Small chronic lacunar infarct within the left lentiform nucleus. No abnormal susceptibility hypointensity to indicate intracranial hemorrhage. No hydrocephalus, extra-axial collection, or effacement of basilar cisterns. Vascular: As below. Skull and upper cervical spine: Normal marrow signal. Sinuses/Orbits: Partial opacification of the right mastoid air cells. Bilateral intra-ocular lens replacement. Otherwise negative. Other: None. MRA HEAD FINDINGS Internal carotid arteries: Bilateral lumen irregularity  without significant stenosis compatible with intracranial atherosclerosis. Anterior cerebral arteries: Mild right A1 and severe right A2 stenosis. Moderate to severe left A1 stenosis. Middle cerebral arteries: Right M1 mild stenosis and multiple areas of mild-to-moderate stenosis in the sylvian branches. Left M1 irregularity with moderate to severe stenosis and multiple areas of mild-to-moderate stenosis and sylvian branches. Anterior communicating artery: Patent. Posterior communicating arteries: No posterior communicating artery identified likely hypoplastic or absent. Posterior cerebral arteries: Moderate left P1 stenosis and multiple segments of moderate to severe stenosis and distal left PCA branches. Tandem segments of mild stenosis and right P1/P2. Multiple segments of moderate to severe stenosis in distal right PCA branches. Basilar artery:  Patent. Vertebral arteries: Proximal left V4 occlusion with patent vessel near the vertebrobasilar junction. Patent right V4 segment. No aneurysm identified. IMPRESSION: MRI head: 1. 11 mm focus of acute/early subacute infarction within left posterior frontal subcortical white matter. No hemorrhage or mass effect. 2. Moderate chronic microvascular ischemic changes and moderate parenchymal volume loss of the brain. Progression from 2013. MRA head: 1. Proximal left V4 occlusion with patent V4 near the vertebrobasilar junction, probably chronic given absence of infarction in the left vertebral distribution. No additional vessel occlusion identified. 2. Extensive intracranial atherosclerosis in the anterior posterior distributions multiple segments of stenosis. These results will be called to the ordering clinician or representative by the Radiologist Assistant, and communication documented in the PACS or zVision Dashboard. Electronically Signed   By: Kristine Garbe M.D.   On: 01/23/2018 01:29   US Carotid Bilateral (at [facility] Only)  Result Date:  01/23/2018 CLINICAL DATA:  TIA symptoms, hypertension, stroke EXAM: BILATERAL CAROTID DUPLEX ULTRASOUND TECHNIQUE: Pearline Cables scale imaging, color Doppler and duplex ultrasound were performed of bilateral carotid and vertebral arteries in  the neck. COMPARISON:  01/23/2018 MRI FINDINGS: Criteria: Quantification of carotid stenosis is based on velocity parameters that correlate the residual internal carotid diameter with NASCET-based stenosis levels, using the diameter of the distal internal carotid lumen as the denominator for stenosis measurement. The following velocity measurements were obtained: RIGHT ICA:  85/24 cm/sec CCA:  82/95 cm/sec SYSTOLIC ICA/CCA RATIO:  1.1 DIASTOLIC ICA/CCA RATIO:  1.7 ECA:  94 cm/sec LEFT ICA:  154/26 cm/sec CCA:  62/13 cm/sec SYSTOLIC ICA/CCA RATIO:  1.6 DIASTOLIC ICA/CCA RATIO:  1.4 ECA:  210 cm/sec RIGHT CAROTID ARTERY: Moderate heterogeneous partially calcified right carotid system atherosclerosis. Despite this, no hemodynamically significant right ICA stenosis, velocity elevation, or turbulent flow. Degree of narrowing estimated less than 50%. RIGHT VERTEBRAL ARTERY:  Antegrade LEFT CAROTID ARTERY: Similar moderate heterogeneous partially calcified left carotid bifurcation atherosclerosis. Proximal ICA mild velocity elevation with slight turbulent flow. Left proximal ICA velocity measures 154/26 centimeters/second. Degree of stenosis estimated at 50-69% (but closer to the 50% range). LEFT VERTEBRAL ARTERY:  Limited visualization but appears antegrade IMPRESSION: Left greater than right carotid atherosclerosis. Moderate left ICA stenosis estimated at 50-69% by ultrasound criteria. See above comment. Right ICA narrowing less than 50% Patent antegrade vertebral flow bilaterally Electronically Signed   By: Jerilynn Mages.  Shick M.D.   On: 01/23/2018 09:11   Mr Jodene Nam Head/brain YQ Cm  Result Date: 01/23/2018 CLINICAL DATA:  84 y/o F; TIA, initial examination. Episode of slurred speech. EXAM: MRI HEAD  WITHOUT CONTRAST MRA HEAD WITHOUT CONTRAST TECHNIQUE: Multiplanar, multiecho pulse sequences of the brain and surrounding structures were obtained without intravenous contrast. Angiographic images of the head were obtained using MRA technique without contrast. COMPARISON:  01/22/2018 CT head.  03/13/2012 MRI head. FINDINGS: MRI HEAD FINDINGS Brain: 11 mm focus of reduced diffusion in left posterior frontal subcortical white matter (series 101, image 21 and series 6, image 21). No associated hemorrhage or mass effect. Numerousnonspecific foci of T2 FLAIR hyperintense signal abnormality in subcortical and periventricular white matter as well as pons are compatible withmoderatechronic microvascular ischemic changes for age. Moderatebrain parenchymal volume loss. Findings are progressed from 2013. Small chronic lacunar infarct within the left lentiform nucleus. No abnormal susceptibility hypointensity to indicate intracranial hemorrhage. No hydrocephalus, extra-axial collection, or effacement of basilar cisterns. Vascular: As below. Skull and upper cervical spine: Normal marrow signal. Sinuses/Orbits: Partial opacification of the right mastoid air cells. Bilateral intra-ocular lens replacement. Otherwise negative. Other: None. MRA HEAD FINDINGS Internal carotid arteries: Bilateral lumen irregularity without significant stenosis compatible with intracranial atherosclerosis. Anterior cerebral arteries: Mild right A1 and severe right A2 stenosis. Moderate to severe left A1 stenosis. Middle cerebral arteries: Right M1 mild stenosis and multiple areas of mild-to-moderate stenosis in the sylvian branches. Left M1 irregularity with moderate to severe stenosis and multiple areas of mild-to-moderate stenosis and sylvian branches. Anterior communicating artery: Patent. Posterior communicating arteries: No posterior communicating artery identified likely hypoplastic or absent. Posterior cerebral arteries: Moderate left P1 stenosis  and multiple segments of moderate to severe stenosis and distal left PCA branches. Tandem segments of mild stenosis and right P1/P2. Multiple segments of moderate to severe stenosis in distal right PCA branches. Basilar artery:  Patent. Vertebral arteries: Proximal left V4 occlusion with patent vessel near the vertebrobasilar junction. Patent right V4 segment. No aneurysm identified. IMPRESSION: MRI head: 1. 11 mm focus of acute/early subacute infarction within left posterior frontal subcortical white matter. No hemorrhage or mass effect. 2. Moderate chronic microvascular ischemic changes and moderate parenchymal volume loss  of the brain. Progression from 2013. MRA head: 1. Proximal left V4 occlusion with patent V4 near the vertebrobasilar junction, probably chronic given absence of infarction in the left vertebral distribution. No additional vessel occlusion identified. 2. Extensive intracranial atherosclerosis in the anterior posterior distributions multiple segments of stenosis. These results will be called to the ordering clinician or representative by the Radiologist Assistant, and communication documented in the PACS or zVision Dashboard. Electronically Signed   By: Kristine Garbe M.D.   On: 01/23/2018 01:29   Ct Head Code Stroke Wo Contrast  Result Date: 01/22/2018 CLINICAL DATA:  Code stroke. Episode of aphasia from 225-240 pm today. EXAM: CT HEAD WITHOUT CONTRAST TECHNIQUE: Contiguous axial images were obtained from the base of the skull through the vertex without intravenous contrast. COMPARISON:  08/25/2014 FINDINGS: Brain: No evidence of acute infarction, hemorrhage, hydrocephalus, extra-axial collection or mass lesion/mass effect. There is generalized atrophy that has progressed since 2015. Mild to moderate chronic small vessel ischemic type change that is also progressed. Small sulcal calcification along the right parietal convexity, stable. Vascular: No hyperdense vessel or unexpected  calcification. Skull: Generalized demineralization. No acute or aggressive finding. Sinuses/Orbits: Bilateral cataract resection.  No acute finding. Other: These results were called by telephone at the time of interpretation on 01/22/2018 at 3:54 pm to Dr. Rudene Re , who verbally acknowledged these results. ASPECTS [facility] Stroke Program Early CT Score) - Ganglionic level infarction (caudate, lentiform nuclei, internal capsule, insula, M1-M3 cortex): 7 - Supraganglionic infarction (M4-M6 cortex): 3 Total score (0-10 with 10 being normal): 10 IMPRESSION: Aging brain without acute finding. Electronically Signed   By: Monte Fantasia M.D.   On: 01/22/2018 15:56    Follow up with PCP in 1 week.  Management plans discussed with the patient, family and they are in agreement.  CODE STATUS:     Code Status Orders  (From admission, onward)        Start     Ordered   01/22/18 1950  Full code  Continuous     01/22/18 1949    Code Status History    Date Active Date Inactive Code Status Order ID Comments User Context   01/03/2018 15:21 01/14/2018 19:54 Full Code [id number]  Gorden Harms, MD Inpatient   11/09/2017 11:31 11/22/2017 19:13 Full Code [id number]  Leonides Schanz Inpatient   11/09/2017 10:19 11/09/2017 11:22 Full Code [id number]  Sela Hua, PA-C Outpatient   11/09/2017 10:19 11/09/2017 10:19 Full Code [id number]  Algernon Huxley, MD Outpatient   07/12/2017 10:05 07/12/2017 16:56 Full Code [id number]  Algernon Huxley, MD Inpatient   06/21/2017 11:25 06/21/2017 16:22 Full Code [id number]  Algernon Huxley, MD Inpatient   04/18/2017 11:50 04/18/2017 13:02 Full Code [id number]  Algernon Huxley, MD Inpatient   04/18/2017 11:04 04/18/2017 11:50 Full Code [id number]  Algernon Huxley, MD Inpatient    Advance Directive Documentation     Most Recent Value  Type of Advance Directive  Living will  Pre-existing out of facility DNR order (yellow form or pink MOST form)  No data  "MOST" Form in  Place?  No data      TOTAL TIME TAKING CARE OF THIS PATIENT ON DAY OF DISCHARGE: more than 30 minutes.   Leia Alf Sydney Azure M.D on 01/23/2018 at 10:50 AM  Between 7am to 6pm - Pager - [phone]  After 6pm go to [email or web address] - password [email or web address] [facility]  Office  [phone]  CC: Primary  care physician; Idelle Crouch, MD  Note: This dictation was prepared with Dragon dictation along with smaller phrase technology. Any transcriptional errors that result from this process are unintentional.

## 2018-01-23 NOTE — Progress Notes (Signed)
PT Cancellation Note  Patient Details Name: Dawn [name] MRN: [id number] DOB: 03-22-1928   Cancelled Treatment:    Reason Eval/Treat Not Completed: PT screened, no needs identified, will sign off.  Per RN and SW pt is preparing for d/c back to SNF to resume rehab.     Collie Siad PT, DPT 01/23/2018, 11:52 AM

## 2018-01-23 NOTE — Progress Notes (Signed)
Report called to [facility] and EMS contacted for transport.  Clarise Cruz, RN

## 2018-01-23 NOTE — Progress Notes (Signed)
*  PRELIMINARY RESULTS* Echocardiogram 2D Echocardiogram has been performed.  Sherrie Sport 01/23/2018, 3:52 PM

## 2018-01-23 NOTE — Care Management Obs Status (Signed)
[place] NOTIFICATION   Patient Details  Name: Fatou Dunnigan MRN: [id number] Date of Birth: [birth year]   Medicare Observation Status Notification Given:  Yes    Shelbie Ammons, RN 01/23/2018, 1:14 PM

## 2018-01-23 NOTE — Consult Note (Addendum)
Referring Physician: Sudini    Chief Complaint: Surred speech  HPI: Dawn [name] is an 84 y.o. female with a history of skin cancer, hypertension, sciatica, urinary tract infection in the past, Alinda Sierras virus diarrhea in the past was referred from [facility] facility for slurred speech.  Patient reports that she did well yesterday morning.  After her lunch visitors left noted that her speech was slurred and she felt confused.  Patient was brought in for evaluation. At baseline patient only walks short distances with a walker.  Requires help for transfers.  Due to bloody diarrhea with Norovirus was taken off Plavix.   Initial NIHSS of 2  Date last known well: Date: 01/22/2018 Time last known well: Time: 12:15 tPA Given: No: Improvement in symptoms, recent bleeding requiring transfusion  Past Medical History:  Diagnosis Date  . Cancer ([place])    skin cancer  . 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 Hypertension   . Sciatica     Past Surgical History:  Procedure Laterality Date  . ABDOMINAL HYSTERECTOMY    . APPENDECTOMY    . CATARACT EXTRACTION, BILATERAL    . CHOLECYSTECTOMY    . ENDARTERECTOMY FEMORAL Right 04/19/2017   Procedure: ENDARTERECTOMY COMMON FEMORAL ARTERY, PROFUNDA  FEMORIS ARTERY, and  SUPERFICIAL FEMORAL ARTERY;  Surgeon: Algernon Huxley, MD;  Location: [facility] ORS;  Service: Vascular;  Laterality: Right;  . ENDARTERECTOMY FEMORAL Left 11/14/2017   Procedure: ENDARTERECTOMY FEMORAL;  Surgeon: Algernon Huxley, MD;  Location: [facility] ORS;  Service: Vascular;  Laterality: Left;  . HIP SURGERY Bilateral   . LOWER EXTREMITY ANGIOGRAM Right 04/19/2017   Procedure: LOWER EXTREMITY ANGIOGRAM WITH SUPERFICIAL FEMORAL ARTERY STENT PLACEMENT;  Surgeon: Algernon Huxley, MD;  Location: [facility] ORS;  Service: Vascular;  Laterality: Right;  . LOWER EXTREMITY ANGIOGRAPHY Right 04/18/2017   Procedure: Lower Extremity Angiography;  Surgeon: Algernon Huxley, MD;  Location: [place] [facility];  Service: Cardiovascular;  Laterality: Right;  . LOWER EXTREMITY ANGIOGRAPHY Left 07/12/2017   Procedure: Lower Extremity Angiography;  Surgeon: Algernon Huxley, MD;  Location: [place] [facility];  Service: Cardiovascular;  Laterality: Left;  . LOWER EXTREMITY ANGIOGRAPHY Left 11/09/2017   Procedure: LOWER EXTREMITY ANGIOGRAPHY;  Surgeon: Algernon Huxley, MD;  Location: [place] [facility];  Service: Cardiovascular;  Laterality: Left;  . LOWER EXTREMITY INTERVENTION  07/12/2017   Procedure: Lower Extremity Intervention;  Surgeon: Algernon Huxley, MD;  Location: [place] [facility];  Service: Cardiovascular;;  . LOWER EXTREMITY INTERVENTION  11/09/2017   Procedure: LOWER EXTREMITY INTERVENTION;  Surgeon: Algernon Huxley, MD;  Location: [place] [facility];  Service: Cardiovascular;;  . RENAL ANGIOGRAPHY N/A 06/21/2017   Procedure: Renal Angiography;  Surgeon: Algernon Huxley, MD;  Location: [place] [facility];  Service: Cardiovascular;  Laterality: N/A;  . SHOULDER SURGERY Bilateral   . TOTAL HIP ARTHROPLASTY Bilateral     Family History  Problem Relation Age of Onset  . Diabetes Father   . Heart disease Father    Social History:  reports that she quit smoking about 39 years ago. Her smoking use included cigarettes. she has never used smokeless tobacco. She reports that she does not drink alcohol or use drugs.  Allergies:  Allergies  Allergen Reactions  . Percocet [Oxycodone-Acetaminophen] Itching  . Percodan [Oxycodone-Aspirin] Itching    Medications:  I have reviewed the patient's current medications. Prior to Admission:  Medications Prior to Admission  Medication Sig Dispense Refill Last Dose  . acidophilus (RISAQUAD) CAPS capsule Take 1 capsule by mouth daily. 30 capsule 3 01/22/2018 at 0804  . amLODipine (NORVASC) 10 MG tablet Take 1 tablet (10 mg total) by mouth daily. 30 tablet 3 01/22/2018 at 0804  . aspirin EC 81 MG tablet Take 81 mg by mouth  daily.   01/22/2018 at [place]  . atorvastatin (LIPITOR) 20 MG tablet TAKE 1 TABLET BY MOUTH ONCE DAILY AT  6PM 30 tablet 3 01/21/2018 at 1813  . Cholecalciferol (VITAMIN D3) 400 units CAPS Take 400 Units by mouth daily.   01/22/2018 at [place]  . cloNIDine (CATAPRES) 0.1 MG tablet Take 1 tablet (0.1 mg total) by mouth 2 (two) times daily. (Patient taking differently: Take 0.1 mg by mouth 2 (two) times daily. ) 60 tablet 0 01/22/2018 at 0804  . Cyanocobalamin (B-12) 5000 MCG CAPS Take 5,000 mcg by mouth daily.    01/22/2018 at [place]  . guaiFENesin (MUCINEX) 600 MG 12 hr tablet Take 600 mg by mouth 2 (two) times daily.   01/22/2018 at [place]  . guaifenesin (ROBITUSSIN) 100 MG/5ML syrup Take 400 mg by mouth 3 (three) times daily as needed for cough.   PRN at PRN  . ipratropium-albuterol (DUONEB) 0.5-2.5 (3) MG/3ML SOLN Take 3 mLs by nebulization every 6 (six) hours as needed. (Patient taking differently: Take 3 mLs by nebulization every 6 (six) hours as needed. For shortness of breath) 360 mL  PRN at PRN  . ketorolac (ACULAR) 0.4 % SOLN Place 1 drop into the left eye 3 (three) times daily.    01/22/2018 at 1250  . Multiple Minerals-Vitamins (LEG-GESIC PO) Take 0.5 tablets by mouth at bedtime as needed. For restless leg syndrome (legatrin pm)   PRN at PRN  . ondansetron (ZOFRAN-ODT) 4 MG disintegrating tablet Take 4 mg by mouth every 6 (six) hours as needed for nausea.   PRN at PRN  . pantoprazole (PROTONIX) 40 MG tablet Take 1 tablet (40 mg total) by mouth 2 (two) times daily before a meal.   01/22/2018 at 0556  . predniSONE (DELTASONE) 10 MG tablet Take 20 mg by mouth daily.   01/22/2018 at 1355  . senna-docusate (SENOKOT-S) 8.6-50 MG tablet Take 1 tablet by mouth 2 (two) times daily as needed for mild constipation. 60 tablet 0 PRN at PRN  . traMADol (ULTRAM) 50 MG tablet Take 1 tablet (50 mg total) by mouth every 6 (six) hours as needed. (Patient taking differently: Take 50 mg by mouth every 6 (six) hours as needed for  moderate pain. ) 20 tablet 0 01/17/2018 at 1023  . tretinoin (RETIN-A) 0.05 % cream Apply 1 application topically every other day. EVERY OTHER NIGHT   01/21/2018 at 1813  . acetaminophen (TYLENOL) 325 MG tablet Take 2 tablets (650 mg total) by mouth every 6 (six) hours as needed for mild pain (or Fever >/= 101).   01/17/2018 at 2017  . Hydrocortisone (GERHARDT'S BUTT CREAM) CREA Apply 1 application topically 2 (two) times daily. (Patient not taking: Reported on 01/22/2018)   Not Taking at Unknown time   Scheduled: . acidophilus  1 capsule Oral Daily  . amLODipine  10 mg Oral Daily  . aspirin  300 mg Rectal Daily   Or  . aspirin  325 mg Oral Daily  . atorvastatin  20 mg Oral q1800  . pantoprazole  40 mg Oral BID AC  . predniSONE  20 mg Oral Daily  ROS: History obtained from the patient  General ROS: negative for - chills, fatigue, fever, night sweats, weight gain or weight loss Psychological ROS: negative for - behavioral disorder, hallucinations, memory difficulties, mood swings or suicidal ideation Ophthalmic ROS: negative for - blurry vision, double vision, eye pain or loss of vision ENT ROS: negative for - epistaxis, nasal discharge, oral lesions, sore throat, tinnitus or vertigo Allergy and Immunology ROS: negative for - hives or itchy/watery eyes Hematological and Lymphatic ROS: negative for - bleeding problems, bruising or swollen lymph nodes Endocrine ROS: negative for - galactorrhea, hair pattern changes, polydipsia/polyuria or temperature intolerance Respiratory ROS: negative for - cough, hemoptysis, shortness of breath or wheezing Cardiovascular ROS: negative for - chest pain, dyspnea on exertion, edema or irregular heartbeat Gastrointestinal ROS: negative for - abdominal pain, diarrhea, hematemesis, nausea/vomiting or stool incontinence Genito-Urinary ROS: negative for - dysuria, hematuria, incontinence or urinary frequency/urgency Musculoskeletal ROS: back pain, joint pain,  arthritic changes Neurological ROS: as noted in HPI Dermatological ROS: negative for rash and skin lesion changes  Physical Examination: Blood pressure (!) 137/52, pulse 94, temperature 97.6 F (36.4 C), temperature source Oral, resp. rate 18, height 5\' 3"  (1.6 m), weight 43.4 kg (95 lb 11.2 oz), SpO2 97 %.  HEENT-  Normocephalic, no lesions, without obvious abnormality.  Normal external eye and conjunctiva.  Normal TM's bilaterally.  Normal auditory canals and external ears. Normal external nose, mucus membranes and septum.  Normal pharynx. Cardiovascular- S1, S2 normal, pulses palpable throughout   Lungs- chest clear, no wheezing, rales, normal symmetric air entry Abdomen- soft, non-tender; bowel sounds normal; no masses,  no organomegaly Extremities- no edema Lymph-no adenopathy palpable Musculoskeletal-arthritic changes Skin-thin skin with bruising noted  Neurological Examination   Mental Status: Alert, oriented, thought content appropriate.  Speech fluent without evidence of aphasia.  Dysarthric.  Able to follow 3 step commands without difficulty. Cranial Nerves: II: Discs flat bilaterally; Visual fields grossly normal, pupils equal, round, reactive to light and accommodation III,IV, VI: ptosis not present, extra-ocular motions intact bilaterally V,VII: decrease right NLF, facial light touch sensation normal bilaterally VIII: hearing normal bilaterally IX,X: gag reflex present XI: bilateral shoulder shrug XII: midline tongue extension Motor: Right : Upper extremity   5-/5 With drift   Left:     Upper extremity   5/5  Lower extremity   4+/5     Lower extremity   4-/5 Significant atrophy in the hand intrinsics bilaterally Sensory: Pinprick and light touch intact throughout, bilaterally Deep Tendon Reflexes: 2+ in the upper extremities and absent in the lower extremities Plantars: Right: mute   Left: mute Cerebellar: Normal finger-to-nose testing bilaterally, difficult to  assess heel-to-shin due to lower extremity weakness Gait: not tested due to safety concerns    Laboratory Studies:  Basic Metabolic Panel: Recent Labs  Lab 01/22/18 1529  NA 138  K 3.1*  CL 101  CO2 25  GLUCOSE 113*  BUN 18  CREATININE 0.77  CALCIUM 8.6*    Liver Function Tests: No results for input(s): AST, ALT, ALKPHOS, BILITOT, PROT, ALBUMIN in the last 168 hours. No results for input(s): LIPASE, AMYLASE in the last 168 hours. No results for input(s): AMMONIA in the last 168 hours.  CBC: Recent Labs  Lab 01/22/18 1529  WBC 7.6  HGB 10.0*  HCT 31.4*  MCV 84.7  PLT 338    Cardiac Enzymes: Recent Labs  Lab 01/22/18 1554  TROPONINI 0.03*    BNP: Invalid input(s): POCBNP  CBG: Recent Labs  Lab 01/22/18 1517  GLUCAP 113*    Microbiology: Results for orders placed or performed during the hospital encounter of 01/03/18  Urine Culture     Status: Abnormal   Collection Time: 01/03/18 11:41 AM  Result Value Ref Range Status   Specimen Description   Final    URINE, RANDOM Performed at [facility], [address] [place], [place] [address]    Special Requests   Final    NONE Performed at [facility], [address] [place], [place] [address]    Culture >=100,000 COLONIES/mL PROVIDENCIA RETTGERI (A)  Final   Report Status 01/05/2018 FINAL  Final   Organism ID, Bacteria PROVIDENCIA RETTGERI (A)  Final      Susceptibility   Providencia rettgeri - MIC*    AMPICILLIN RESISTANT Resistant     CEFAZOLIN >=64 RESISTANT Resistant     CEFTRIAXONE <=1 SENSITIVE Sensitive     CIPROFLOXACIN <=0.25 SENSITIVE Sensitive     GENTAMICIN <=1 SENSITIVE Sensitive     IMIPENEM 4 SENSITIVE Sensitive     NITROFURANTOIN 256 RESISTANT Resistant     TRIMETH/SULFA <=20 SENSITIVE Sensitive     AMPICILLIN/SULBACTAM >=32 RESISTANT Resistant     PIP/TAZO <=4 SENSITIVE Sensitive     * >=100,000 COLONIES/mL PROVIDENCIA RETTGERI  Blood Culture (routine x 2)      Status: None   Collection Time: 01/03/18  1:00 PM  Result Value Ref Range Status   Specimen Description BLOOD RFOA  Final   Special Requests   Final    BOTTLES DRAWN AEROBIC AND ANAEROBIC Blood Culture adequate volume   Culture   Final    NO GROWTH 5 DAYS Performed at [facility], [address] [place], [place] [address]    Report Status 01/08/2018 FINAL  Final  Blood Culture (routine x 2)     Status: None   Collection Time: 01/03/18  1:00 PM  Result Value Ref Range Status   Specimen Description BLOOD RFOA  Final   Special Requests   Final    BOTTLES DRAWN AEROBIC AND ANAEROBIC Blood Culture adequate volume   Culture   Final    NO GROWTH 5 DAYS Performed at [facility], [address] [place], [place] [address]    Report Status 01/08/2018 FINAL  Final  Gastrointestinal Panel by PCR , Stool     Status: Abnormal   Collection Time: 01/03/18  3:27 PM  Result Value Ref Range Status   Campylobacter species NOT DETECTED NOT DETECTED Final   Plesimonas shigelloides NOT DETECTED NOT DETECTED Final   Salmonella species NOT DETECTED NOT DETECTED Final   Yersinia enterocolitica NOT DETECTED NOT DETECTED Final   Vibrio species NOT DETECTED NOT DETECTED Final   Vibrio cholerae NOT DETECTED NOT DETECTED Final   Enteroaggregative E coli (EAEC) NOT DETECTED NOT DETECTED Final   Enteropathogenic E coli (EPEC) NOT DETECTED NOT DETECTED Final   Enterotoxigenic E coli (ETEC) NOT DETECTED NOT DETECTED Final   Shiga like toxin producing E coli (STEC) NOT DETECTED NOT DETECTED Final   Shigella/Enteroinvasive E coli (EIEC) NOT DETECTED NOT DETECTED Final   Cryptosporidium NOT DETECTED NOT DETECTED Final   Cyclospora cayetanensis NOT DETECTED NOT DETECTED Final   Entamoeba histolytica NOT DETECTED NOT DETECTED Final   Giardia lamblia NOT DETECTED NOT DETECTED Final   Adenovirus F40/41 NOT DETECTED NOT DETECTED Final   Astrovirus NOT DETECTED NOT DETECTED Final   Norovirus  GI/GII DETECTED (A) NOT DETECTED Final    Comment:  RESULT CALLED TO, READ BACK BY AND VERIFIED WITH: JANCY JOHNSTONE AT 1828 ON 01/03/2018 JJB    Rotavirus A NOT DETECTED NOT DETECTED Final   Sapovirus (I, II, IV, and V) NOT DETECTED NOT DETECTED Final    Comment: Performed at [facility], [address] [place], [place] [address]  MRSA PCR Screening     Status: None   Collection Time: 01/03/18  6:39 PM  Result Value Ref Range Status   MRSA by PCR NEGATIVE NEGATIVE Final    Comment:        The GeneXpert MRSA Assay (FDA approved for NASAL specimens only), is one component of a comprehensive MRSA colonization surveillance program. It is not intended to diagnose MRSA infection nor to guide or monitor treatment for MRSA infections. Performed at [facility], [address] [place], [place] [address]     Coagulation Studies: Recent Labs    01/22/18 1554  LABPROT 12.8  INR 0.97    Urinalysis:  Recent Labs  Lab 01/22/18 1700  COLORURINE YELLOW*  LABSPEC 1.011  PHURINE 6.0  GLUCOSEU NEGATIVE  HGBUR NEGATIVE  BILIRUBINUR NEGATIVE  KETONESUR NEGATIVE  PROTEINUR NEGATIVE  NITRITE NEGATIVE  LEUKOCYTESUR LARGE*    Lipid Panel:    Component Value Date/Time   CHOL 110 01/23/2018 0553   TRIG 72 01/23/2018 0553   HDL 39 (L) 01/23/2018 0553   CHOLHDL 2.8 01/23/2018 0553   VLDL 14 01/23/2018 0553   LDLCALC 57 01/23/2018 0553    HgbA1C: No results found for: HGBA1C  Urine Drug Screen:      Component Value Date/Time   LABOPIA NONE DETECTED 01/22/2018 1700   COCAINSCRNUR NONE DETECTED 01/22/2018 1700   LABBENZ NONE DETECTED 01/22/2018 1700   AMPHETMU NONE DETECTED 01/22/2018 1700   THCU NONE DETECTED 01/22/2018 1700   LABBARB NONE DETECTED 01/22/2018 1700    Alcohol Level:  Recent Labs  Lab 01/22/18 2013  ETH <10    Other results: EKG: sinus rhythm at 90 bpm.  Imaging: Mr Brain Wo Contrast  Result Date: 01/23/2018 CLINICAL DATA:  84  y/o F; TIA, initial examination. Episode of slurred speech. EXAM: MRI HEAD WITHOUT CONTRAST MRA HEAD WITHOUT CONTRAST TECHNIQUE: Multiplanar, multiecho pulse sequences of the brain and surrounding structures were obtained without intravenous contrast. Angiographic images of the head were obtained using MRA technique without contrast. COMPARISON:  01/22/2018 CT head.  03/13/2012 MRI head. FINDINGS: MRI HEAD FINDINGS Brain: 11 mm focus of reduced diffusion in left posterior frontal subcortical white matter (series 101, image 21 and series 6, image 21). No associated hemorrhage or mass effect. Numerousnonspecific foci of T2 FLAIR hyperintense signal abnormality in subcortical and periventricular white matter as well as pons are compatible withmoderatechronic microvascular ischemic changes for age. Moderatebrain parenchymal volume loss. Findings are progressed from 2013. Small chronic lacunar infarct within the left lentiform nucleus. No abnormal susceptibility hypointensity to indicate intracranial hemorrhage. No hydrocephalus, extra-axial collection, or effacement of basilar cisterns. Vascular: As below. Skull and upper cervical spine: Normal marrow signal. Sinuses/Orbits: Partial opacification of the right mastoid air cells. Bilateral intra-ocular lens replacement. Otherwise negative. Other: None. MRA HEAD FINDINGS Internal carotid arteries: Bilateral lumen irregularity without significant stenosis compatible with intracranial atherosclerosis. Anterior cerebral arteries: Mild right A1 and severe right A2 stenosis. Moderate to severe left A1 stenosis. Middle cerebral arteries: Right M1 mild stenosis and multiple areas of mild-to-moderate stenosis in the sylvian branches. Left M1 irregularity with moderate to severe stenosis and multiple areas of mild-to-moderate stenosis  and sylvian branches. Anterior communicating artery: Patent. Posterior communicating arteries: No posterior communicating artery identified likely  hypoplastic or absent. Posterior cerebral arteries: Moderate left P1 stenosis and multiple segments of moderate to severe stenosis and distal left PCA branches. Tandem segments of mild stenosis and right P1/P2. Multiple segments of moderate to severe stenosis in distal right PCA branches. Basilar artery:  Patent. Vertebral arteries: Proximal left V4 occlusion with patent vessel near the vertebrobasilar junction. Patent right V4 segment. No aneurysm identified. IMPRESSION: MRI head: 1. 11 mm focus of acute/early subacute infarction within left posterior frontal subcortical white matter. No hemorrhage or mass effect. 2. Moderate chronic microvascular ischemic changes and moderate parenchymal volume loss of the brain. Progression from 2013. MRA head: 1. Proximal left V4 occlusion with patent V4 near the vertebrobasilar junction, probably chronic given absence of infarction in the left vertebral distribution. No additional vessel occlusion identified. 2. Extensive intracranial atherosclerosis in the anterior posterior distributions multiple segments of stenosis. These results will be called to the ordering clinician or representative by the Radiologist Assistant, and communication documented in the PACS or zVision Dashboard. Electronically Signed   By: Kristine Garbe M.D.   On: 01/23/2018 01:29   US Carotid Bilateral (at [facility] Only)  Result Date: 01/23/2018 CLINICAL DATA:  TIA symptoms, hypertension, stroke EXAM: BILATERAL CAROTID DUPLEX ULTRASOUND TECHNIQUE: Pearline Cables scale imaging, color Doppler and duplex ultrasound were performed of bilateral carotid and vertebral arteries in the neck. COMPARISON:  01/23/2018 MRI FINDINGS: Criteria: Quantification of carotid stenosis is based on velocity parameters that correlate the residual internal carotid diameter with NASCET-based stenosis levels, using the diameter of the distal internal carotid lumen as the denominator for stenosis measurement. The following  velocity measurements were obtained: RIGHT ICA:  85/24 cm/sec CCA:  25/42 cm/sec SYSTOLIC ICA/CCA RATIO:  1.1 DIASTOLIC ICA/CCA RATIO:  1.7 ECA:  94 cm/sec LEFT ICA:  154/26 cm/sec CCA:  70/62 cm/sec SYSTOLIC ICA/CCA RATIO:  1.6 DIASTOLIC ICA/CCA RATIO:  1.4 ECA:  210 cm/sec RIGHT CAROTID ARTERY: Moderate heterogeneous partially calcified right carotid system atherosclerosis. Despite this, no hemodynamically significant right ICA stenosis, velocity elevation, or turbulent flow. Degree of narrowing estimated less than 50%. RIGHT VERTEBRAL ARTERY:  Antegrade LEFT CAROTID ARTERY: Similar moderate heterogeneous partially calcified left carotid bifurcation atherosclerosis. Proximal ICA mild velocity elevation with slight turbulent flow. Left proximal ICA velocity measures 154/26 centimeters/second. Degree of stenosis estimated at 50-69% (but closer to the 50% range). LEFT VERTEBRAL ARTERY:  Limited visualization but appears antegrade IMPRESSION: Left greater than right carotid atherosclerosis. Moderate left ICA stenosis estimated at 50-69% by ultrasound criteria. See above comment. Right ICA narrowing less than 50% Patent antegrade vertebral flow bilaterally Electronically Signed   By: Jerilynn Mages.  Shick M.D.   On: 01/23/2018 09:11   Mr Jodene Nam Head/brain BJ Cm  Result Date: 01/23/2018 CLINICAL DATA:  84 y/o F; TIA, initial examination. Episode of slurred speech. EXAM: MRI HEAD WITHOUT CONTRAST MRA HEAD WITHOUT CONTRAST TECHNIQUE: Multiplanar, multiecho pulse sequences of the brain and surrounding structures were obtained without intravenous contrast. Angiographic images of the head were obtained using MRA technique without contrast. COMPARISON:  01/22/2018 CT head.  03/13/2012 MRI head. FINDINGS: MRI HEAD FINDINGS Brain: 11 mm focus of reduced diffusion in left posterior frontal subcortical white matter (series 101, image 21 and series 6, image 21). No associated hemorrhage or mass effect. Numerousnonspecific foci of T2 FLAIR  hyperintense signal abnormality in subcortical and periventricular white matter as well as pons are compatible withmoderatechronic  microvascular ischemic changes for age. Moderatebrain parenchymal volume loss. Findings are progressed from 2013. Small chronic lacunar infarct within the left lentiform nucleus. No abnormal susceptibility hypointensity to indicate intracranial hemorrhage. No hydrocephalus, extra-axial collection, or effacement of basilar cisterns. Vascular: As below. Skull and upper cervical spine: Normal marrow signal. Sinuses/Orbits: Partial opacification of the right mastoid air cells. Bilateral intra-ocular lens replacement. Otherwise negative. Other: None. MRA HEAD FINDINGS Internal carotid arteries: Bilateral lumen irregularity without significant stenosis compatible with intracranial atherosclerosis. Anterior cerebral arteries: Mild right A1 and severe right A2 stenosis. Moderate to severe left A1 stenosis. Middle cerebral arteries: Right M1 mild stenosis and multiple areas of mild-to-moderate stenosis in the sylvian branches. Left M1 irregularity with moderate to severe stenosis and multiple areas of mild-to-moderate stenosis and sylvian branches. Anterior communicating artery: Patent. Posterior communicating arteries: No posterior communicating artery identified likely hypoplastic or absent. Posterior cerebral arteries: Moderate left P1 stenosis and multiple segments of moderate to severe stenosis and distal left PCA branches. Tandem segments of mild stenosis and right P1/P2. Multiple segments of moderate to severe stenosis in distal right PCA branches. Basilar artery:  Patent. Vertebral arteries: Proximal left V4 occlusion with patent vessel near the vertebrobasilar junction. Patent right V4 segment. No aneurysm identified. IMPRESSION: MRI head: 1. 11 mm focus of acute/early subacute infarction within left posterior frontal subcortical white matter. No hemorrhage or mass effect. 2. Moderate  chronic microvascular ischemic changes and moderate parenchymal volume loss of the brain. Progression from 2013. MRA head: 1. Proximal left V4 occlusion with patent V4 near the vertebrobasilar junction, probably chronic given absence of infarction in the left vertebral distribution. No additional vessel occlusion identified. 2. Extensive intracranial atherosclerosis in the anterior posterior distributions multiple segments of stenosis. These results will be called to the ordering clinician or representative by the Radiologist Assistant, and communication documented in the PACS or zVision Dashboard. Electronically Signed   By: Kristine Garbe M.D.   On: 01/23/2018 01:29   Ct Head Code Stroke Wo Contrast  Result Date: 01/22/2018 CLINICAL DATA:  Code stroke. Episode of aphasia from 225-240 pm today. EXAM: CT HEAD WITHOUT CONTRAST TECHNIQUE: Contiguous axial images were obtained from the base of the skull through the vertex without intravenous contrast. COMPARISON:  08/25/2014 FINDINGS: Brain: No evidence of acute infarction, hemorrhage, hydrocephalus, extra-axial collection or mass lesion/mass effect. There is generalized atrophy that has progressed since 2015. Mild to moderate chronic small vessel ischemic type change that is also progressed. Small sulcal calcification along the right parietal convexity, stable. Vascular: No hyperdense vessel or unexpected calcification. Skull: Generalized demineralization. No acute or aggressive finding. Sinuses/Orbits: Bilateral cataract resection.  No acute finding. Other: These results were called by telephone at the time of interpretation on 01/22/2018 at 3:54 pm to Dr. Rudene Re , who verbally acknowledged these results. ASPECTS [facility] Stroke Program Early CT Score) - Ganglionic level infarction (caudate, lentiform nuclei, internal capsule, insula, M1-M3 cortex): 7 - Supraganglionic infarction (M4-M6 cortex): 3 Total score (0-10 with 10 being normal): 10  IMPRESSION: Aging brain without acute finding. Electronically Signed   By: Monte Fantasia M.D.   On: 01/22/2018 15:56    Assessment: 84 y.o. female with history of HTN presenting after and episode of slurred speech.  Patient much improved today.  Family still believes there is some mild dysarthria.  Patient recently taken off antiplatelet therapy.  MRI of the brain reviewed and shows an acute left posterior frontal subcortical infarct.  There is also V4 occlusion on the  left.  Etiology questionably embolic.  Carotid dopplers show no evidence of hemodynamically significant stenosis on the right, left 50-69% stenosis.  This is followed on an outpatient basis.   Echocardiogram pending.  LDL 57.  Stroke Risk Factors - hypertension  Plan: 1. HgbA1c 2. Patient to continue follow up for her carotid stenosis since this is now considered a symptomatic lesion.   3. PT consult, OT consult, Speech consult 4. . Prophylactic therapy-Restart Plavix 5. Telemetry monitoring 6. Frequent neuro checks   Alexis Goodell, MD Neurology [phone] 01/23/2018, 1:20 PM

## 2018-01-23 NOTE — Progress Notes (Signed)
Called by nursing to review STAT MRI orders.  Pt has small area of acute/subacute infarct.  During chart review noted that patient's UA shows Large leukocyte esterase and TNTC WBC with rare bacteria.  Given possibility of overlap in stroke and UTI symptoms, urine culture ordered and rocephin started for presumptive UTI in addition to treatment for stroke.  Jacqulyn Bath [facility] 01/23/2018, 1:45 AM

## 2018-01-23 NOTE — Progress Notes (Signed)
Initial Nutrition Assessment  DOCUMENTATION CODES:   Severe malnutrition in context of social or environmental circumstances, Underweight  INTERVENTION:  Provide Hormel Shake (Vital Cuisine) TID with meals, each supplement provides 520 kcal and 22 grams of protein.  NUTRITION DIAGNOSIS:   Severe Malnutrition related to social / environmental circumstances as evidenced by severe fat depletion, severe muscle depletion.  GOAL:   Patient will meet greater than or equal to 90% of their needs  MONITOR:   PO intake, Supplement acceptance, Labs, Weight trends, Skin, I & O's  REASON FOR ASSESSMENT:   Malnutrition Screening Tool    ASSESSMENT:   84 year old female with PMHx of sciatica, HTN, PAD, skin cancer, recent UTI and Noravirus who now presents from [facility] with slurred speech admitted with TIA, HTN, hypokalemia, presumptive UTI.   -Following SLP evaluation 2/27 patient was placed on dysphagia 3 diet with minced meats in gravy and nectar-thick liquids with no straws.  Met with patient and her son at bedside. Patient is known to this RD from previous admission in 10/2017. Son reports that her appetite has actually been picking up recently since she has been at [facility] for rehab. She is eating part of 3 meals per day now and drinking a nectar-thick high-calorie protein shake. She has been on dysphagia 3 diet (with what sounds like dysphagia 2/minced meats) and nectar-thick liquids. They have also been not using straws. He reports that prior to her last hospitalization is when she had a poor appetite for a few months and has lost quite a bit of weight. She had been experiencing anorexia and early satiety. She does not really like Magic Cup but will eat a few bites if it comes on her tray. She enjoyed the Vital Cuisine shakes.  UBW years ago was 130 lbs. Son reports that recently it had been about 100-110 lbs before her weight loss a few months ago. Patient is trying to gain weight  back.  Medications reviewed and include: acidophilus 1 capsule daily, pantoprazole, prednisone 20 mg daily, ceftriaxone.  Labs reviewed: CBG 113, Potassium 3.1.  Discussed with SLP.  NUTRITION - FOCUSED PHYSICAL EXAM:    Most Recent Value  Orbital Region  Severe depletion  Upper Arm Region  Severe depletion  Thoracic and Lumbar Region  Severe depletion  Buccal Region  Severe depletion  Temple Region  Severe depletion  Clavicle Bone Region  Severe depletion  Clavicle and Acromion Bone Region  Severe depletion  Scapular Bone Region  Severe depletion  Dorsal Hand  Severe depletion  Patellar Region  Severe depletion  Anterior Thigh Region  Severe depletion  Posterior Calf Region  Severe depletion  Edema (RD Assessment)  None  Hair  Reviewed  Eyes  Reviewed  Mouth  Reviewed [pt has full upper and partial lower dentures]  Skin  Reviewed [ecchymosis]  Nails  Unable to assess [nails painted teal so unable to assess]     Diet Order:  Fall precautions DIET DYS 3 Room service appropriate? Yes with Assist; Fluid consistency: Nectar Thick Aspiration precautions  EDUCATION NEEDS:   No education needs have been identified at this time  Skin:  Skin Assessment: Skin Integrity Issues: Skin Integrity Issues:: Stage II, Other (Comment) Stage II: left buttocks (1cm x 1cm x 0.1cm) Other: ecchymosis to bilateral arms and legs  Last BM:  PTA (01/20/2018 per chart)  Height:   Ht Readings from Last 1 Encounters:  01/22/18 _0  (1.6 m)    Weight:  Wt Readings from Last 1 Encounters:  01/22/18 95 lb 11.2 oz (43.4 kg)    Ideal Body Weight:  52.3 kg  BMI:  Body mass index is 16.95 kg/m.  Estimated Nutritional Needs:   Kcal:  1200-1400  Protein:  65-75 grams   Fluid:  1.2-1.4 L/day  Willey Blade, MS, RD, LDN Office: [phone] Pager: [phone] After Hours/Weekend Pager: [phone]

## 2018-01-23 NOTE — Progress Notes (Signed)
OT Cancellation Note  Patient Details Name: Esbeydi Manago MRN: [id number] DOB: 09/22/1928   Cancelled Treatment:    Reason Eval/Treat Not Completed: OT screened, no needs identified, will sign off. Order received, chart reviewed. Per RN and SW, pt preparing to d/c back to SNF to resume rehab.  Jeni Salles, MPH, MS, OTR/L ascom [phone] 01/23/18, 11:55 AM

## 2018-01-23 NOTE — Progress Notes (Signed)
Pharmacy Antibiotic Note  Dawn [name] is a 84 y.o. female admitted on 01/22/2018 with UTI.  Pharmacy has been consulted for ceftriaxone dosing.  Plan: Will start ceftriaxone 1g IV daily  Height: 5\' 3"  (160 cm) Weight: 95 lb 11.2 oz (43.4 kg) IBW/kg (Calculated) : 52.4  Temp (24hrs), Avg:97.9 F (36.6 C), Min:97.7 F (36.5 C), Max:98.1 F (36.7 C)  Recent Labs  Lab 01/22/18 1529  WBC 7.6  CREATININE 0.77    Estimated Creatinine Clearance: 33.9 mL/min (by C-G formula based on SCr of 0.77 mg/dL).    Allergies  Allergen Reactions  . Percocet [Oxycodone-Acetaminophen] Itching  . Percodan [Oxycodone-Aspirin] Itching    Thank you for allowing pharmacy to be a part of this patient's care.  Tobie Lords, PharmD, BCPS Clinical Pharmacist 01/23/2018

## 2018-01-23 NOTE — Clinical Social Work Note (Signed)
Patient to be d/c'ed today to [facility].  Patient and family agreeable to plans will transport via ems RN to call report [phone].  Patient's grandson is at bedside and aware that patient will be discharging back to [facility] today.  Evette Cristal, MSW, [place]

## 2018-01-23 NOTE — Progress Notes (Signed)
Patient transported to [facility] via EMS.  Grandson at bedside and aware.  Clarise Cruz, RN

## 2018-01-24 LAB — URINE CULTURE

## 2018-02-22 ENCOUNTER — Emergency Department: Payer: Medicare Other

## 2018-02-22 ENCOUNTER — Other Ambulatory Visit: Payer: Self-pay

## 2018-02-22 ENCOUNTER — Encounter: Payer: Self-pay | Admitting: Emergency Medicine

## 2018-02-22 ENCOUNTER — Emergency Department
Admission: EM | Admit: 2018-02-22 | Discharge: 2018-02-22 | Disposition: A | Discharge status: Home / Self Care | Payer: Medicare Other | Attending: Emergency Medicine | Admitting: Emergency Medicine

## 2018-02-22 DIAGNOSIS — Z7902 Long term (current) use of antithrombotics/antiplatelets: Secondary | ICD-10-CM | POA: Insufficient documentation

## 2018-02-22 DIAGNOSIS — S41111A Laceration without foreign body of right upper arm, initial encounter: Secondary | ICD-10-CM | POA: Diagnosis not present

## 2018-02-22 DIAGNOSIS — Z85828 Personal history of other malignant neoplasm of skin: Secondary | ICD-10-CM | POA: Diagnosis not present

## 2018-02-22 DIAGNOSIS — Z87891 Personal history of nicotine dependence: Secondary | ICD-10-CM | POA: Diagnosis not present

## 2018-02-22 DIAGNOSIS — Y998 Other external cause status: Secondary | ICD-10-CM | POA: Diagnosis not present

## 2018-02-22 DIAGNOSIS — J449 Chronic obstructive pulmonary disease, unspecified: Secondary | ICD-10-CM | POA: Diagnosis not present

## 2018-02-22 DIAGNOSIS — Y92129 Unspecified place in nursing home as the place of occurrence of the external cause: Secondary | ICD-10-CM | POA: Insufficient documentation

## 2018-02-22 DIAGNOSIS — I10 Essential (primary) hypertension: Secondary | ICD-10-CM | POA: Insufficient documentation

## 2018-02-22 DIAGNOSIS — W01198A Fall on same level from slipping, tripping and stumbling with subsequent striking against other object, initial encounter: Secondary | ICD-10-CM | POA: Insufficient documentation

## 2018-02-22 DIAGNOSIS — S0001XA Abrasion of scalp, initial encounter: Secondary | ICD-10-CM | POA: Diagnosis not present

## 2018-02-22 DIAGNOSIS — Z8673 Personal history of transient ischemic attack (TIA), and cerebral infarction without residual deficits: Secondary | ICD-10-CM | POA: Diagnosis not present

## 2018-02-22 DIAGNOSIS — T148XXA Other injury of unspecified body region, initial encounter: Secondary | ICD-10-CM

## 2018-02-22 DIAGNOSIS — M79601 Pain in right arm: Secondary | ICD-10-CM | POA: Diagnosis not present

## 2018-02-22 DIAGNOSIS — Y939 Activity, unspecified: Secondary | ICD-10-CM | POA: Diagnosis not present

## 2018-02-22 DIAGNOSIS — S0990XA Unspecified injury of head, initial encounter: Secondary | ICD-10-CM | POA: Diagnosis present

## 2018-02-22 DIAGNOSIS — W19XXXA Unspecified fall, initial encounter: Secondary | ICD-10-CM

## 2018-02-22 LAB — CBC
HCT: 37.2 % (ref 35.0–47.0)
Hemoglobin: 12 g/dL (ref 12.0–16.0)
MCH: 28.2 pg (ref 26.0–34.0)
MCHC: 32.4 g/dL (ref 32.0–36.0)
MCV: 87.1 fL (ref 80.0–100.0)
PLATELETS: 244 10*3/uL (ref 150–440)
RBC: 4.27 MIL/uL (ref 3.80–5.20)
RDW: 20.8 % — ABNORMAL HIGH (ref 11.5–14.5)
WBC: 9 10*3/uL (ref 3.6–11.0)

## 2018-02-22 LAB — BASIC METABOLIC PANEL
Anion gap: 11 (ref 5–15)
BUN: 39 mg/dL — ABNORMAL HIGH (ref 6–20)
CALCIUM: 8.9 mg/dL (ref 8.9–10.3)
CO2: 26 mmol/L (ref 22–32)
CREATININE: 0.93 mg/dL (ref 0.44–1.00)
Chloride: 100 mmol/L — ABNORMAL LOW (ref 101–111)
GFR calc non Af Amer: 54 mL/min — ABNORMAL LOW (ref >60)
Glucose, Bld: 119 mg/dL — ABNORMAL HIGH (ref 65–99)
Potassium: 3.9 mmol/L (ref 3.5–5.1)
SODIUM: 137 mmol/L (ref 135–145)

## 2018-02-22 NOTE — Discharge Instructions (Addendum)
Please keep the head bandage in place for the next 2 days.  After which she may take it off, wash with warm water and a gentle soap, air dry or blow dry.  Replace bandage.  As far as the skin tear, please keep these bandage intact for the next 4 days, at which time he would take the bandage off to evaluate, then cover again with a new bandage.  Please follow-up with your primary care doctor in the next 3 days for recheck/reevaluation.  Return to the emergency department for any acutely concerning symptoms.

## 2018-02-22 NOTE — ED Triage Notes (Signed)
Pt arrived from home via EMS due to a fall from a standing position.  Pt takes Plavix.  PT has head lac and skin tear on right forearm.  H/o DVT with stents placed in legs and abdomen per EMS.

## 2018-02-22 NOTE — ED Provider Notes (Signed)
[facility] Emergency Department Provider Note  Time seen: 5:05 PM  I have reviewed the triage vital signs and the nursing notes.   HISTORY  Chief Complaint Fall    HPI Dawn [name] is a 84 y.o. female with a past medical history of hypertension, presents to the emergency department after a fall.  According to the patient she lost her balance fell backwards hitting the back of her head.  Patient suffered a laceration to the back of her head and a skin tear to the right arm.  Denies LOC.  Denies nausea or vomiting.  Son states the same.  States patient was at a care facility but they brought her back home today, lives with the son.  Currently the patient appears well, no distress.  Largely negative review of systems.  Does state mild headache and mild right arm pain.   Past Medical History:  Diagnosis Date  . Cancer ([place])    skin cancer  . 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 Hypertension   . Sciatica     Patient Active Problem List   Diagnosis Date Noted  . TIA (transient ischemic attack) 01/22/2018  . Pressure injury of skin 01/04/2018  . Sepsis ([place]) 01/03/2018  . Protein-calorie malnutrition, severe 11/14/2017  . Atherosclerosis of native arteries of extremity with rest pain ([place]) 11/09/2017  . Ischemic leg 11/09/2017  . Atherosclerotic peripheral vascular disease with rest pain ([place]) 11/09/2017  . Atypical chest pain 09/21/2017  . Renal artery stenosis ([place]) 06/15/2017  . Hyperlipidemia 05/09/2017  . Essential hypertension 10/17/2016  . Carotid stenosis 10/17/2016  . PVD (peripheral vascular disease) ([place]) 10/17/2016  . B12 deficiency 07/19/2016  . Vitamin D deficiency, unspecified 07/19/2016  . History of right hip replacement 11/04/2015  . Aftercare following bilateral hip joint replacement surgery 11/05/2014  . SCC (squamous cell carcinoma) 05/21/2014  . COPD with asthma ([place])  03/16/2014  . S/P hip replacement 10/03/2012    Past Surgical History:  Procedure Laterality Date  . ABDOMINAL HYSTERECTOMY    . APPENDECTOMY    . CATARACT EXTRACTION, BILATERAL    . CHOLECYSTECTOMY    . ENDARTERECTOMY FEMORAL Right 04/19/2017   Procedure: ENDARTERECTOMY COMMON FEMORAL ARTERY, PROFUNDA  FEMORIS ARTERY, and  SUPERFICIAL FEMORAL ARTERY;  Surgeon: Algernon Huxley, MD;  Location: [facility] ORS;  Service: Vascular;  Laterality: Right;  . ENDARTERECTOMY FEMORAL Left 11/14/2017   Procedure: ENDARTERECTOMY FEMORAL;  Surgeon: Algernon Huxley, MD;  Location: [facility] ORS;  Service: Vascular;  Laterality: Left;  . HIP SURGERY Bilateral   . LOWER EXTREMITY ANGIOGRAM Right 04/19/2017   Procedure: LOWER EXTREMITY ANGIOGRAM WITH SUPERFICIAL FEMORAL ARTERY STENT PLACEMENT;  Surgeon: Algernon Huxley, MD;  Location: [facility] ORS;  Service: Vascular;  Laterality: Right;  . LOWER EXTREMITY ANGIOGRAPHY Right 04/18/2017   Procedure: Lower Extremity Angiography;  Surgeon: Algernon Huxley, MD;  Location: [place] CV LAB;  Service: Cardiovascular;  Laterality: Right;  . LOWER EXTREMITY ANGIOGRAPHY Left 07/12/2017   Procedure: Lower Extremity Angiography;  Surgeon: Algernon Huxley, MD;  Location: [place] CV LAB;  Service: Cardiovascular;  Laterality: Left;  . LOWER EXTREMITY ANGIOGRAPHY Left 11/09/2017   Procedure: LOWER EXTREMITY ANGIOGRAPHY;  Surgeon: Algernon Huxley, MD;  Location: [place] CV LAB;  Service: Cardiovascular;  Laterality: Left;  . LOWER EXTREMITY INTERVENTION  07/12/2017   Procedure: Lower Extremity Intervention;  Surgeon: Algernon Huxley, MD;  Location: [facility]  INVASIVE CV LAB;  Service: Cardiovascular;;  . LOWER EXTREMITY INTERVENTION  11/09/2017   Procedure: LOWER EXTREMITY INTERVENTION;  Surgeon: Algernon Huxley, MD;  Location: [place] CV LAB;  Service: Cardiovascular;;  . RENAL ANGIOGRAPHY N/A 06/21/2017   Procedure: Renal Angiography;  Surgeon: Algernon Huxley, MD;  Location: [place] CV LAB;   Service: Cardiovascular;  Laterality: N/A;  . SHOULDER SURGERY Bilateral   . TOTAL HIP ARTHROPLASTY Bilateral     Prior to Admission medications   Medication Sig Start Date End Date Taking? Authorizing Provider  acetaminophen (TYLENOL) 325 MG tablet Take 2 tablets (650 mg total) by mouth every 6 (six) hours as needed for mild pain (or Fever >/= 101). 01/14/18   Loletha Grayer, MD  acidophilus (RISAQUAD) CAPS capsule Take 1 capsule by mouth daily. 11/23/17   Algernon Huxley, MD  amLODipine (NORVASC) 10 MG tablet Take 1 tablet (10 mg total) by mouth daily. 11/23/17   Algernon Huxley, MD  aspirin EC 81 MG tablet Take 81 mg by mouth daily.    [provider]  atorvastatin (LIPITOR) 20 MG tablet TAKE 1 TABLET BY MOUTH ONCE DAILY AT  6PM 08/29/17   Algernon Huxley, MD  Cholecalciferol (VITAMIN D3) 400 units CAPS Take 400 Units by mouth daily.    [provider]  cloNIDine (CATAPRES) 0.1 MG tablet Take 1 tablet (0.1 mg total) by mouth 2 (two) times daily. Patient taking differently: Take 0.1 mg by mouth 2 (two) times daily.  06/08/16   Daymon Larsen, MD  clopidogrel (PLAVIX) 75 MG tablet Take 1 tablet (75 mg total) by mouth daily. 01/23/18   Hillary Bow, MD  Cyanocobalamin (B-12) 5000 MCG CAPS Take 5,000 mcg by mouth daily.     [provider]  guaifenesin (ROBITUSSIN) 100 MG/5ML syrup Take 400 mg by mouth 3 (three) times daily as needed for cough.    [provider]  ipratropium-albuterol (DUONEB) 0.5-2.5 (3) MG/3ML SOLN Take 3 mLs by nebulization every 6 (six) hours as needed. Patient taking differently: Take 3 mLs by nebulization every 6 (six) hours as needed. For shortness of breath 01/11/18   Demetrios Loll, MD  ketorolac (ACULAR) 0.4 % SOLN Place 1 drop into the left eye 3 (three) times daily.     [provider]  Multiple Minerals-Vitamins (LEG-GESIC PO) Take 0.5 tablets by mouth at bedtime as needed. For restless leg syndrome (legatrin pm)    [provider]  ondansetron (ZOFRAN-ODT) 4 MG disintegrating tablet Take 4 mg by mouth every 6 (six) hours as needed for nausea.    [provider]  pantoprazole (PROTONIX) 40 MG tablet Take 1 tablet (40 mg total) by mouth 2 (two) times daily before a meal. 01/11/18   Demetrios Loll, MD  senna-docusate (SENOKOT-S) 8.6-50 MG tablet Take 1 tablet by mouth 2 (two) times daily as needed for mild constipation. 01/14/18   Loletha Grayer, MD  traMADol (ULTRAM) 50 MG tablet Take 1 tablet (50 mg total) by mouth every 6 (six) hours as needed. Patient taking differently: Take 50 mg by mouth every 6 (six) hours as needed for moderate pain.  01/11/18   Demetrios Loll, MD  tretinoin (RETIN-A) 0.05 % cream Apply 1 application topically every other day. EVERY OTHER NIGHT    [provider]    Allergies  Allergen Reactions  . Percocet [Oxycodone-Acetaminophen] Itching  . Percodan [Oxycodone-Aspirin] Itching    Family History  Problem Relation Age of Onset  . Diabetes  Father   . Heart disease Father     Social History Social History   Tobacco Use  . Smoking status: Former Smoker    Types: Cigarettes    Last attempt to quit: 1980    Years since quitting: 39.2  . Smokeless tobacco: Never Used  Substance Use Topics  . Alcohol use: No  . Drug use: No    Review of Systems Constitutional: Negative for fever.  Negative loss of consciousness. Eyes: Negative for visual complaints ENT: Negative for recent illness/congestion Cardiovascular: Negative for chest pain. Respiratory: Negative for shortness of breath. Gastrointestinal: Negative for abdominal pain Genitourinary: Negative for dysuria. Musculoskeletal: Skin tear to right arm.  Negative for neck or back pain Skin: Skin tear to right arm Neurological: Mild headache All other ROS negative  ____________________________________________   PHYSICAL EXAM:  VITAL SIGNS: ED Triage Vitals  Enc Vitals Group     BP 02/22/18 1522 139/69      Pulse Rate 02/22/18 1522 93     Resp 02/22/18 1522 16     Temp 02/22/18 1522 97.8 F (36.6 C)     Temp Source 02/22/18 1522 Oral     SpO2 02/22/18 1522 97 %     Weight 02/22/18 1515 92 lb (41.7 kg)     Height 02/22/18 1515 5' 2.5" (1.588 m)     Head Circumference --      Peak Flow --      Pain Score 02/22/18 1515 0     Pain Loc --      Pain Edu? --      Excl. in [place]? --    Constitutional: Alert and oriented. Well appearing and in no distress. Eyes: Normal exam ENT   Head: Normocephalic and atraumatic.   Mouth/Throat: Mucous membranes are moist. Cardiovascular: Normal rate, regular rhythm. No murmur Respiratory: Normal respiratory effort without tachypnea nor retractions. Breath sounds are clear Gastrointestinal: Soft and nontender. No distention.  Musculoskeletal: Nontender with normal range of motion in all extremities.  Skin tear to right upper extremity.  Good range of motion in all extremities without pain elicited. Neurologic:  Normal speech and language. No gross focal neurologic deficits Skin:  Skin is warm.  2 small to moderate sized skin tears to the right forearm, hemostatic.  Laceration to occipital scalp with underlying hematoma. Psychiatric: Mood and affect are normal.   ____________________________________________    EKG  EKG reviewed and interpreted by myself shows sinus rhythm at 95 bpm, narrow QRS, normal axis, largely normal intervals and nonspecific ST changes.  ____________________________________________    RADIOLOGY  CT scan of the head shows no acute abnormality.  ____________________________________________   INITIAL IMPRESSION / ASSESSMENT AND PLAN / ED COURSE  Pertinent labs & imaging results that were available during my care of the patient were reviewed by me and considered in my medical decision making (see chart for details).  Patient presents to the emergency department after mechanical fall.  Differential would include ICH,  laceration, skin tear, contusion, concussion.  CT scan of the head shows no acute abnormality.  Labs are largely at baseline.  Overall the patient appears very well.  We will repair the occipital scalp laceration with staples, repair skin tears with Steri-Strips.  Patient and son agreeable to this plan of care.  Anticipate likely discharge home.  I have cleaned the wounds very carefully, skin tears repaired with Steri-Strips with great coverage.  After significant irrigation using saline as well as hydrogen peroxide, scalp lesion appears most  consistent with a fairly large approximately 4-5 cm diameter abrasion.  No laceration for suture repair.  Placed a bandage over this instructed not to change the bandage for 2 days afterwards wash with warm water and a gentle soap and then use a hair dryer to dry.  Patient will follow up with her doctor.  Believes tetanus is up-to-date.  ____________________________________________   FINAL CLINICAL IMPRESSION(S) / ED DIAGNOSES  Fall Skin tear Scalp laceration    Harvest Dark, MD 02/22/18 1755

## 2018-02-23 ENCOUNTER — Other Ambulatory Visit: Payer: Self-pay

## 2018-02-23 ENCOUNTER — Emergency Department
Admission: EM | Admit: 2018-02-23 | Discharge: 2018-02-24 | Disposition: A | Discharge status: Home / Self Care | Payer: Medicare Other | Attending: Emergency Medicine | Admitting: Emergency Medicine

## 2018-02-23 ENCOUNTER — Encounter: Payer: Self-pay | Admitting: Emergency Medicine

## 2018-02-23 ENCOUNTER — Emergency Department: Payer: Medicare Other

## 2018-02-23 DIAGNOSIS — Z87891 Personal history of nicotine dependence: Secondary | ICD-10-CM | POA: Diagnosis not present

## 2018-02-23 DIAGNOSIS — Z7901 Long term (current) use of anticoagulants: Secondary | ICD-10-CM | POA: Diagnosis not present

## 2018-02-23 DIAGNOSIS — W19XXXA Unspecified fall, initial encounter: Secondary | ICD-10-CM

## 2018-02-23 DIAGNOSIS — Z96643 Presence of artificial hip joint, bilateral: Secondary | ICD-10-CM | POA: Insufficient documentation

## 2018-02-23 DIAGNOSIS — Z85828 Personal history of other malignant neoplasm of skin: Secondary | ICD-10-CM | POA: Insufficient documentation

## 2018-02-23 DIAGNOSIS — I10 Essential (primary) hypertension: Secondary | ICD-10-CM | POA: Insufficient documentation

## 2018-02-23 DIAGNOSIS — W19XXXD Unspecified fall, subsequent encounter: Secondary | ICD-10-CM | POA: Insufficient documentation

## 2018-02-23 DIAGNOSIS — Z79899 Other long term (current) drug therapy: Secondary | ICD-10-CM | POA: Insufficient documentation

## 2018-02-23 DIAGNOSIS — Z7982 Long term (current) use of aspirin: Secondary | ICD-10-CM | POA: Insufficient documentation

## 2018-02-23 DIAGNOSIS — R82998 Other abnormal findings in urine: Secondary | ICD-10-CM | POA: Insufficient documentation

## 2018-02-23 DIAGNOSIS — S0001XA Abrasion of scalp, initial encounter: Secondary | ICD-10-CM

## 2018-02-23 DIAGNOSIS — S0001XD Abrasion of scalp, subsequent encounter: Secondary | ICD-10-CM | POA: Insufficient documentation

## 2018-02-23 DIAGNOSIS — S0990XD Unspecified injury of head, subsequent encounter: Secondary | ICD-10-CM | POA: Diagnosis present

## 2018-02-23 LAB — BASIC METABOLIC PANEL
ANION GAP: 12 (ref 5–15)
BUN: 38 mg/dL — ABNORMAL HIGH (ref 6–20)
CO2: 25 mmol/L (ref 22–32)
CREATININE: 1.05 mg/dL — AB (ref 0.44–1.00)
Calcium: 8.6 mg/dL — ABNORMAL LOW (ref 8.9–10.3)
Chloride: 102 mmol/L (ref 101–111)
GFR calc non Af Amer: 46 mL/min — ABNORMAL LOW (ref >60)
GFR, EST AFRICAN AMERICAN: 54 mL/min — AB (ref >60)
GLUCOSE: 112 mg/dL — AB (ref 65–99)
POTASSIUM: 4.4 mmol/L (ref 3.5–5.1)
Sodium: 139 mmol/L (ref 135–145)

## 2018-02-23 LAB — CBC
HCT: 31.1 % — ABNORMAL LOW (ref 35.0–47.0)
Hemoglobin: 10 g/dL — ABNORMAL LOW (ref 12.0–16.0)
MCH: 28.2 pg (ref 26.0–34.0)
MCHC: 32.2 g/dL (ref 32.0–36.0)
MCV: 87.5 fL (ref 80.0–100.0)
Platelets: 197 10*3/uL (ref 150–440)
RBC: 3.55 MIL/uL — ABNORMAL LOW (ref 3.80–5.20)
RDW: 21.1 % — AB (ref 11.5–14.5)
WBC: 12.2 10*3/uL — AB (ref 3.6–11.0)

## 2018-02-23 LAB — URINALYSIS, COMPLETE (UACMP) WITH MICROSCOPIC
Bacteria, UA: NONE SEEN
Bilirubin Urine: NEGATIVE
GLUCOSE, UA: NEGATIVE mg/dL
Hgb urine dipstick: NEGATIVE
KETONES UR: NEGATIVE mg/dL
Nitrite: NEGATIVE
PH: 5 (ref 5.0–8.0)
Protein, ur: 30 mg/dL — AB
Specific Gravity, Urine: 1.017 (ref 1.005–1.030)

## 2018-02-23 LAB — PROTIME-INR
INR: 0.9
PROTHROMBIN TIME: 12.1 s (ref 11.4–15.2)

## 2018-02-23 MED ORDER — PANTOPRAZOLE SODIUM 40 MG PO TBEC
40.0000 mg | DELAYED_RELEASE_TABLET | Freq: Two times a day (BID) | ORAL | Status: DC
  Administered 2018-02-23 – 2018-02-24 (×2): 40 mg via ORAL
  Filled 2018-02-23 (×2): qty 1

## 2018-02-23 MED ORDER — CLONIDINE HCL 0.1 MG PO TABS
0.1000 mg | ORAL_TABLET | Freq: Two times a day (BID) | ORAL | Status: DC
  Administered 2018-02-23 – 2018-02-24 (×2): 0.1 mg via ORAL
  Filled 2018-02-23 (×2): qty 1

## 2018-02-23 MED ORDER — SENNOSIDES-DOCUSATE SODIUM 8.6-50 MG PO TABS: 1.0000 | ORAL_TABLET | Freq: Two times a day (BID) | ORAL | Status: DC | PRN

## 2018-02-23 MED ORDER — ACETAMINOPHEN 325 MG PO TABS
650.0000 mg | ORAL_TABLET | Freq: Once | ORAL | Status: AC
  Administered 2018-02-23: 650 mg via ORAL
  Filled 2018-02-23: qty 2

## 2018-02-23 MED ORDER — AMLODIPINE BESYLATE 5 MG PO TABS
10.0000 mg | ORAL_TABLET | Freq: Every day | ORAL | Status: DC
Start: 2018-02-23 — End: 2018-02-24
  Administered 2018-02-23 – 2018-02-24 (×2): 10 mg via ORAL
  Filled 2018-02-23 (×2): qty 2

## 2018-02-23 MED ORDER — FOSFOMYCIN TROMETHAMINE 3 G PO PACK
3.0000 g | PACK | ORAL | Status: AC
  Administered 2018-02-23: 3 g via ORAL
  Filled 2018-02-23: qty 3

## 2018-02-23 MED ORDER — CLOPIDOGREL BISULFATE 75 MG PO TABS
75.0000 mg | ORAL_TABLET | Freq: Every day | ORAL | Status: DC
  Administered 2018-02-23 – 2018-02-24 (×2): 75 mg via ORAL
  Filled 2018-02-23 (×2): qty 1

## 2018-02-23 MED ORDER — ATORVASTATIN CALCIUM 20 MG PO TABS
20.0000 mg | ORAL_TABLET | Freq: Every day | ORAL | Status: DC
  Administered 2018-02-23: 20 mg via ORAL
  Filled 2018-02-23: qty 1

## 2018-02-23 MED ORDER — ASPIRIN EC 81 MG PO TBEC
81.0000 mg | DELAYED_RELEASE_TABLET | Freq: Every day | ORAL | Status: DC
  Administered 2018-02-23 – 2018-02-24 (×2): 81 mg via ORAL
  Filled 2018-02-23 (×2): qty 1

## 2018-02-23 MED ORDER — SODIUM CHLORIDE 0.9 % IV BOLUS
500.0000 mL | Freq: Once | INTRAVENOUS | Status: AC
  Administered 2018-02-23: 500 mL via INTRAVENOUS

## 2018-02-23 MED ORDER — TRAMADOL HCL 50 MG PO TABS: 50.0000 mg | ORAL_TABLET | Freq: Four times a day (QID) | ORAL | Status: DC | PRN

## 2018-02-23 NOTE — ED Notes (Signed)
Pt placed on bedpan with bowel movement x1, small amount. Bleeding on head controlled. Dressing in place without noted saturation. Blood collected and sent to lab for analysis. Son requested MD to check wound on forearm. Dressing in place. Not saturated in blood on obvious signs of bleeding noted.

## 2018-02-23 NOTE — ED Notes (Signed)
Pt in room with dressing applied to head and right forearm. Son states d/c yesterday with continued bleeding to head saturating multiple gauze pads.

## 2018-02-23 NOTE — [place] Notes (Signed)
LCSW will consult with Debrah admissions director from [facility] and obtain room number and call report number. All documentation has been sent to [employer].  Patient is unable to return home its unsafe ( falls risk). She will be leaving ED in am to facility- they have 2 other admissions and cant take her tonight.  EDP and ED RN and Dr Doy Hutching was consulted.   [employer] LCSW [phone]

## 2018-02-23 NOTE — ED Notes (Signed)
Patient transported to X-ray 

## 2018-02-23 NOTE — ED Notes (Signed)
Patient transported to US 

## 2018-02-23 NOTE — ED Notes (Signed)
ED Provider at bedside. 

## 2018-02-23 NOTE — ED Notes (Signed)
Dressing noted to buttock.

## 2018-02-23 NOTE — ED Provider Notes (Signed)
-----------------------------------------   4:35 PM on 02/23/2018 -----------------------------------------  Social worker was able to arrange for patient to go to [place] home tomorrow morning.  I will sign the FL2 for the patient.  Patient does appear to have a small urinary tract infection on her lab work we will dose a one-time dose of fosfomycin for the patient.   Harvest Dark, MD 02/23/18 [phone]

## 2018-02-23 NOTE — ED Triage Notes (Signed)
States was seen yesterday for fall and left in evening. Comes in with gauze over head with blood soaking through, blood noted on towel and gauze down back of and around neck. Pale.

## 2018-02-23 NOTE — ED Notes (Signed)
Patient given warm blanket.

## 2018-02-23 NOTE — ED Notes (Signed)
Pt unable to stand with assistance therefore pt unable to ambulate with RW. Quale MD notified.

## 2018-02-23 NOTE — ED Notes (Signed)
Head dressing removed and head cleaned removed dried blood and clots. Source of bleeding located. No laceration noted. Skin tear/abrasion to left side of scalp. Area shaved, surgicel applied, and pressure dressing applied per Quale MD order.

## 2018-02-23 NOTE — Clinical Social Work Note (Signed)
Clinical Social Work Assessment  Patient Details  Name: Dawn [name] MRN: [id number] Date of Birth: 29-Mar-1928  Date of referral:  02/23/18               Reason for consult:  Facility Placement                Permission sought to share information with:  Family Supports, Customer service manager Permission granted to share information::  Yes, Verbal Permission Granted  Name::     Dawn [name]  [phone] or cell phone [phone]  Agency::  [facility]   Relationship::     Contact Information:     Housing/Transportation Living arrangements for the past 2 months:  [place] of Information:  Patient, Adult Children Patient Interpreter Needed:  None Criminal Activity/Legal Involvement Pertinent to Current Situation/Hospitalization:  No - Comment as needed Significant Relationships:  Adult Children Lives with:  Adult Children Do you feel safe going back to the place where you live?  Yes Need for family participation in patient care:  Yes (Comment)  Care giving concerns: Son very concerned that patient is not ready to live independently and needs a further stay at SNF until she is stronger.   Social Worker assessment / plan: LCSW met with patient and her son and obtained verbal consent to complete assessment and contact [employer]. Patient has been at [facility] and was discharged on Friday. Patient is unable to care for herself and had a serious fall due to weakness. Son has requested we contact [facility] to see if she can go back. Facility was called spoke to Debrah in admissions and they can accept her back on Sunday March 31st. Completed Fl2 with passr and assessment and will request new PT consult. LCSW will consult with EDP and patients son.  Employment status:  Retired Forensic scientist:  [employer]) PT Recommendations:  Not assessed at this time Information / Referral to community resources:  Acute  Rehab, [place]  Patient/Family's Response to care:  Patient requires more assistance until she gets stronger  Patient/Family's Understanding of and Emotional Response to Diagnosis, Current Treatment, and Prognosis:  Good Understanding  Emotional Assessment Appearance:  Appears stated age Attitude/Demeanor/Rapport:  Gracious Affect (typically observed):  Calm, Accepting Orientation:  Oriented to Self, Oriented to Place, Oriented to  Time, Oriented to Situation Alcohol / Substance use:  Not Applicable Psych involvement (Current and /or in the community):  No (Comment)  Discharge Needs  Concerns to be addressed:  Care Coordination Readmission within the last 30 days:    Current discharge risk:  Dependent with Mobility Barriers to Discharge:  Continued Medical Work up   [place], [place], LCSW 02/23/2018, 4:21 PM

## 2018-02-23 NOTE — ED Provider Notes (Signed)
[facility] Emergency Department Provider Note   ____________________________________________   First MD Initiated Contact with Patient 02/23/18 [phone]     (approximate)  I have reviewed the triage vital signs and the nursing notes.   HISTORY  Chief Complaint Head Injury    HPI Dawn [name] is a 84 y.o. female presents for evaluation due to ongoing bleeding of a scalp wound  The patient fell last night and she was seen and evaluated in the emergency room.  She had a CAT scan, bandages and had an abrasion that was bandaged.  Since going home she has had ongoing small amount of oozing of blood from part of the scalp over the back right.  No neck pain.  No nausea vomiting.  She is recently discharged from rehabilitation center, she has not had any additional falls or injuries.  She and her son come back today as she continues to have some bleeding from the scalp wound.  They have not seen any spurting of blood.  No lightheadedness no weakness no nausea or vomiting.  She reports a mild throbbing headache in the area of the back right scalp  No chest pain.  No trouble breathing.  Has chronic poor vascular sensation in the lower legs, denies any new injury but her son does note that the left lower thigh has seemed just slightly more swollen over the last few days and normal.  Past Medical History:  Diagnosis Date  . Cancer ([place])    skin cancer  . 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 Hypertension   . Sciatica     Patient Active Problem List   Diagnosis Date Noted  . TIA (transient ischemic attack) 01/22/2018  . Pressure injury of skin 01/04/2018  . Sepsis ([place]) 01/03/2018  . Protein-calorie malnutrition, severe 11/14/2017  . Atherosclerosis of native arteries of extremity with rest pain ([place]) 11/09/2017  . Ischemic leg 11/09/2017  . Atherosclerotic peripheral vascular disease with rest pain  ([place]) 11/09/2017  . Atypical chest pain 09/21/2017  . Renal artery stenosis ([place]) 06/15/2017  . Hyperlipidemia 05/09/2017  . Essential hypertension 10/17/2016  . Carotid stenosis 10/17/2016  . PVD (peripheral vascular disease) ([place]) 10/17/2016  . B12 deficiency 07/19/2016  . Vitamin D deficiency, unspecified 07/19/2016  . History of right hip replacement 11/04/2015  . Aftercare following bilateral hip joint replacement surgery 11/05/2014  . SCC (squamous cell carcinoma) 05/21/2014  . COPD with asthma ([place]) 03/16/2014  . S/P hip replacement 10/03/2012    Past Surgical History:  Procedure Laterality Date  . ABDOMINAL HYSTERECTOMY    . APPENDECTOMY    . CATARACT EXTRACTION, BILATERAL    . CHOLECYSTECTOMY    . ENDARTERECTOMY FEMORAL Right 04/19/2017   Procedure: ENDARTERECTOMY COMMON FEMORAL ARTERY, PROFUNDA  FEMORIS ARTERY, and  SUPERFICIAL FEMORAL ARTERY;  Surgeon: Algernon Huxley, MD;  Location: [facility] ORS;  Service: Vascular;  Laterality: Right;  . ENDARTERECTOMY FEMORAL Left 11/14/2017   Procedure: ENDARTERECTOMY FEMORAL;  Surgeon: Algernon Huxley, MD;  Location: [facility] ORS;  Service: Vascular;  Laterality: Left;  . HIP SURGERY Bilateral   . LOWER EXTREMITY ANGIOGRAM Right 04/19/2017   Procedure: LOWER EXTREMITY ANGIOGRAM WITH SUPERFICIAL FEMORAL ARTERY STENT PLACEMENT;  Surgeon: Algernon Huxley, MD;  Location: [facility] ORS;  Service: Vascular;  Laterality: Right;  . LOWER EXTREMITY ANGIOGRAPHY Right 04/18/2017   Procedure: Lower Extremity Angiography;  Surgeon: Algernon Huxley, MD;  Location: [facility]  INVASIVE CV LAB;  Service: Cardiovascular;  Laterality: Right;  . LOWER EXTREMITY ANGIOGRAPHY Left 07/12/2017   Procedure: Lower Extremity Angiography;  Surgeon: Algernon Huxley, MD;  Location: [place] CV LAB;  Service: Cardiovascular;  Laterality: Left;  . LOWER EXTREMITY ANGIOGRAPHY Left 11/09/2017   Procedure: LOWER EXTREMITY ANGIOGRAPHY;  Surgeon: Algernon Huxley, MD;  Location: [place] CV LAB;   Service: Cardiovascular;  Laterality: Left;  . LOWER EXTREMITY INTERVENTION  07/12/2017   Procedure: Lower Extremity Intervention;  Surgeon: Algernon Huxley, MD;  Location: [place] CV LAB;  Service: Cardiovascular;;  . LOWER EXTREMITY INTERVENTION  11/09/2017   Procedure: LOWER EXTREMITY INTERVENTION;  Surgeon: Algernon Huxley, MD;  Location: [place] CV LAB;  Service: Cardiovascular;;  . RENAL ANGIOGRAPHY N/A 06/21/2017   Procedure: Renal Angiography;  Surgeon: Algernon Huxley, MD;  Location: [place] CV LAB;  Service: Cardiovascular;  Laterality: N/A;  . SHOULDER SURGERY Bilateral   . TOTAL HIP ARTHROPLASTY Bilateral     Prior to Admission medications   Medication Sig Start Date End Date Taking? Authorizing Provider  acetaminophen (TYLENOL) 325 MG tablet Take 2 tablets (650 mg total) by mouth every 6 (six) hours as needed for mild pain (or Fever >/= 101). 01/14/18   Loletha Grayer, MD  acidophilus (RISAQUAD) CAPS capsule Take 1 capsule by mouth daily. 11/23/17   Algernon Huxley, MD  amLODipine (NORVASC) 10 MG tablet Take 1 tablet (10 mg total) by mouth daily. 11/23/17   Algernon Huxley, MD  aspirin EC 81 MG tablet Take 81 mg by mouth daily.    [provider]  atorvastatin (LIPITOR) 20 MG tablet TAKE 1 TABLET BY MOUTH ONCE DAILY AT  6PM 08/29/17   Algernon Huxley, MD  Cholecalciferol (VITAMIN D3) 400 units CAPS Take 400 Units by mouth daily.    [provider]  cloNIDine (CATAPRES) 0.1 MG tablet Take 1 tablet (0.1 mg total) by mouth 2 (two) times daily. Patient taking differently: Take 0.1 mg by mouth 2 (two) times daily.  06/08/16   Daymon Larsen, MD  clopidogrel (PLAVIX) 75 MG tablet Take 1 tablet (75 mg total) by mouth daily. 01/23/18   Hillary Bow, MD  Cyanocobalamin (B-12) 5000 MCG CAPS Take 5,000 mcg by mouth daily.     [provider]  guaifenesin (ROBITUSSIN) 100 MG/5ML syrup Take 400 mg by mouth 3 (three) times daily as needed for cough.    [provider]  ipratropium-albuterol (DUONEB) 0.5-2.5 (3) MG/3ML SOLN Take 3 mLs by nebulization every 6 (six) hours as needed. Patient taking differently: Take 3 mLs by nebulization every 6 (six) hours as needed. For shortness of breath 01/11/18   Demetrios Loll, MD  ketorolac (ACULAR) 0.4 % SOLN Place 1 drop into the left eye 3 (three) times daily.     [provider]  Multiple Minerals-Vitamins (LEG-GESIC PO) Take 0.5 tablets by mouth at bedtime as needed. For restless leg syndrome (legatrin pm)    [provider]  ondansetron (ZOFRAN-ODT) 4 MG disintegrating tablet Take 4 mg by mouth every 6 (six) hours as needed for nausea.    [provider]  pantoprazole (PROTONIX) 40 MG tablet Take 1 tablet (40 mg total) by mouth 2 (two) times daily before a meal. 01/11/18   Demetrios Loll, MD  senna-docusate (SENOKOT-S) 8.6-50 MG tablet Take 1 tablet by mouth 2 (two) times daily as needed for mild constipation. 01/14/18   Loletha Grayer, MD  traMADol Veatrice Bourbon) 50  MG tablet Take 1 tablet (50 mg total) by mouth every 6 (six) hours as needed. Patient taking differently: Take 50 mg by mouth every 6 (six) hours as needed for moderate pain.  01/11/18   Demetrios Loll, MD  tretinoin (RETIN-A) 0.05 % cream Apply 1 application topically every other day. EVERY OTHER NIGHT    [provider]    Allergies Percocet [oxycodone-acetaminophen] and Percodan [oxycodone-aspirin]  Family History  Problem Relation Age of Onset  . Diabetes Father   . Heart disease Father     Social History Social History   Tobacco Use  . Smoking status: Former Smoker    Types: Cigarettes    Last attempt to quit: 1980    Years since quitting: 39.2  . Smokeless tobacco: Never Used  Substance Use Topics  . Alcohol use: No  . Drug use: No    Review of Systems Constitutional: No fever/chills Eyes: No visual changes. ENT: No sore throat. Cardiovascular: Denies chest pain. Respiratory: Denies shortness of  breath. Gastrointestinal: No abdominal pain.  No nausea, no vomiting.  No diarrhea.  No constipation. Genitourinary: Negative for dysuria. Musculoskeletal: Negative for back pain. Skin: Negative for rash except as noted in HPI regarding bleeding.  Also a skin tear of the right arm that was repaired. Neurological: Negative for headaches, focal weakness or numbness.    ____________________________________________   PHYSICAL EXAM:  VITAL SIGNS: ED Triage Vitals  Enc Vitals Group     BP 02/23/18 0856 122/72     Pulse Rate 02/23/18 0856 97     Resp 02/23/18 0856 20     Temp 02/23/18 0856 (!) 97.5 F (36.4 C)     Temp Source 02/23/18 0856 Oral     SpO2 02/23/18 0856 98 %     Weight 02/23/18 0857 92 lb (41.7 kg)     Height 02/23/18 0857 5\' 2"  (1.575 m)     Head Circumference --      Peak Flow --      Pain Score 02/23/18 1202 6     Pain Loc --      Pain Edu? --      Excl. in [place]? --     Constitutional: Alert and oriented. Well appearing and in no acute distress. Eyes: Conjunctivae are normal. Head: Atraumatic except after careful cleansing, a very small abrasion is noted to have some mild oozing of venous blood over the right posterior scalp.  A bandage with Surgicel and nonadherent wound dressing was placed over it, after about 10 minutes of continuous pressure, the area has been wrapped at this time. Nose: No congestion/rhinnorhea. Mouth/Throat: Mucous membranes are moist. Neck: No stridor.  No neck pain.  No midline cervical tenderness. Cardiovascular: Normal rate, regular rhythm. Grossly normal heart sounds.  Good peripheral circulation. Respiratory: Normal respiratory effort.  No retractions. Lungs CTAB. Gastrointestinal: Soft and nontender. No distention. Musculoskeletal: No lower extremity tenderness and no edema is apparent.  The patient demonstrates moderate strength in both lower extremities, no deficits.  Dopplerable pulse in the lower extremities bilaterally.  There are  venous stasis changes present.  The left lower thigh seems slightly more swollen than the right in comparison without pain or discomfort.  Full range of motion lower extremities hips ankles and feet without any pain or discomfort.  No bruising or ecchymoses noted the lower extremities. Neurologic:  Normal speech and language. No gross focal neurologic deficits are appreciated.  Skin:  Skin is warm, dry and intact except as noted  and bandaged on scalp. No rash noted.  Bandage over right forearm from a previous skin tear is clean and dry. Psychiatric: Mood and affect are normal. Speech and behavior are normal.  ____________________________________________   LABS (all labs ordered are listed, but only abnormal results are displayed)  Labs Reviewed  CBC - Abnormal; Notable for the following components:      Result Value   WBC 12.2 (*)    RBC 3.55 (*)    Hemoglobin 10.0 (*)    HCT 31.1 (*)    RDW 21.1 (*)    All other components within normal limits  URINALYSIS, COMPLETE (UACMP) WITH MICROSCOPIC - Abnormal; Notable for the following components:   Color, Urine YELLOW (*)    APPearance HAZY (*)    Protein, ur 30 (*)    Leukocytes, UA SMALL (*)    Squamous Epithelial / LPF 0-5 (*)    All other components within normal limits  BASIC METABOLIC PANEL - Abnormal; Notable for the following components:   Glucose, Bld 112 (*)    BUN 38 (*)    Creatinine, Ser 1.05 (*)    Calcium 8.6 (*)    GFR calc non Af Amer 46 (*)    GFR calc Af Amer 54 (*)    All other components within normal limits  URINE CULTURE  PROTIME-INR   ____________________________________________  EKG   ____________________________________________  RADIOLOGY  No indication for repeat imaging. ____________________________________________   PROCEDURES  Procedure(s) performed: None  Procedures  Critical Care performed: No  ____________________________________________   INITIAL IMPRESSION / ASSESSMENT AND PLAN  / ED COURSE  Pertinent labs & imaging results that were available during my care of the patient were reviewed by me and considered in my medical decision making (see chart for details).  Patient represents for evaluation for ongoing bleeding from 1 of her scalp wounds that was bandaged yesterday.  After application of Surgicel, to an area that appears an abrasion likely with some venous oozing secondary to patient being on Plavix after 10 minutes of pressure applied and bandaged there is no evidence of ongoing bleeding.  Son does mention slight swelling in her left lower leg and will obtain ultrasound to further evaluate.  She did have a mild drop in hemoglobin of about 2 points, but no longer having any bleeding.  Hemodynamic are stable.  She does not have any other acute complaints.  Resting comfortably.  ----------------------------------------- 1:03 PM on 02/23/2018 -----------------------------------------  No ongoing bleeding.     ----------------------------------------- 1:24 PM on 02/23/2018 -----------------------------------------  Patient is not able to stand up on her own, she is very fatigued.  Extremely weak and debilitated.  She is not able to ambulate safely.  At this point, the patient normally uses a walker but is unable to ambulate.  I do not feel that she can be safely discharged at this time.  Instead we will choose to involve case management, her son at the bedside is also very concerned that she may need to reenter rehab or be hospitalized.  She has had a fall recently, head injury, and a small drop in her hemoglobin though I believe that there is no evidence of ongoing bleeding.  She is very unsteady, recent admission to the hospital, I will check additional labs, including urinalysis to evaluate for possible infectious cause.  Unclear at this point as to whether the patient will need to stay in the ER for case management and possible placement versus inpatient.  Await  further  evaluation including chemistry and urinalysis today.   ----------------------------------------- 3:33 PM on 02/23/2018 -----------------------------------------  Discussed with patient's son as well as patient at the bedside.  She was not able to ambulate using a walker.  At the present she is heme dynamically stable, slightly elevated white count and some leukocytes in her urine.  I will treat her with a dose of fosfomycin in the event she is starting to develop a recurrence of her urinary tract infection.  No evidence of sepsis.  Our current plan of care is to have social work see the patient today, they will attempt to place her potentially back to [facility].  If she cannot be placed back into a skilled nursing facility, then anticipate evaluation with hospitalist for further disposition, treatment recommendations and observation under the hospitalist service recommendations.  Case and plan discussed with Dr. Doy Hutching, patient's primary care doctor who is aware and agreeable with plan.  Ongoing care assigned to Dr. Kerman Passey.  ____________________________________________   FINAL CLINICAL IMPRESSION(S) / ED DIAGNOSES  Final diagnoses:  Injury of head, subsequent encounter  Abrasion of scalp, initial encounter  High urine leukocyte count      NEW MEDICATIONS STARTED DURING THIS VISIT:  New Prescriptions   No medications on file     Note:  This document was prepared using Dragon voice recognition software and may include unintentional dictation errors.     Delman Kitten, MD 02/23/18 1535

## 2018-02-23 NOTE — ED Notes (Signed)
Meal tray provided. Medication given per MD order. Family and patient deny complaints.

## 2018-02-23 NOTE — ED Notes (Signed)
Patient rearranged on the stretcher. Patient is alert and oriented. NAD.

## 2018-02-23 NOTE — ED Notes (Addendum)
Patient had a large, brown, soft BM that was cleaned up by tech and this Probation officer. Purewick, external female catheter placed.

## 2018-02-23 NOTE — NC FL2 (Signed)
[place] LEVEL OF CARE SCREENING TOOL     IDENTIFICATION  Patient Name: Dawn [name] Birthdate: December 17, 1927 Sex: female Admission Date (Current Location): 02/23/2018  [place] and [place] Number:  Engineering geologist and Address:  [facility], [address], [place], [place] [address]      Provider Number: [id number]  Attending Physician Name and Address:  Delman Kitten, MD  Relative Name and Phone Number:   Aidyn Sportsman [phone]    Current Level of Care: Hospital Recommended Level of Care: [place] Prior Approval Number:    Date Approved/Denied:   PASRR Number:   [id number]  Discharge Plan: SNF    Current Diagnoses: Patient Active Problem List   Diagnosis Date Noted  . TIA (transient ischemic attack) 01/22/2018  . Pressure injury of skin 01/04/2018  . Sepsis ([place]) 01/03/2018  . Protein-calorie malnutrition, severe 11/14/2017  . Atherosclerosis of native arteries of extremity with rest pain ([place]) 11/09/2017  . Ischemic leg 11/09/2017  . Atherosclerotic peripheral vascular disease with rest pain ([place]) 11/09/2017  . Atypical chest pain 09/21/2017  . Renal artery stenosis ([place]) 06/15/2017  . Hyperlipidemia 05/09/2017  . Essential hypertension 10/17/2016  . Carotid stenosis 10/17/2016  . PVD (peripheral vascular disease) ([place]) 10/17/2016  . B12 deficiency 07/19/2016  . Vitamin D deficiency, unspecified 07/19/2016  . History of right hip replacement 11/04/2015  . Aftercare following bilateral hip joint replacement surgery 11/05/2014  . SCC (squamous cell carcinoma) 05/21/2014  . COPD with asthma ([place]) 03/16/2014  . S/P hip replacement 10/03/2012    Orientation RESPIRATION BLADDER Height & Weight     Self, Time, Situation, Place  Normal Incontinent Weight: 92 lb (41.7 kg) Height:  5\' 2"  (157.5 cm)  BEHAVIORAL SYMPTOMS/MOOD NEUROLOGICAL BOWEL NUTRITION STATUS      Continent Diet(normal)   AMBULATORY STATUS COMMUNICATION OF NEEDS Skin   Supervision Verbally Normal                       Personal Care Assistance Level of Assistance  Bathing, Feeding, Dressing Bathing Assistance: Limited assistance Feeding assistance: Independent Dressing Assistance: Limited assistance     Functional Limitations Info  Sight, Hearing, Speech Sight Info: Adequate Hearing Info: Adequate Speech Info: Adequate    SPECIAL CARE FACTORS FREQUENCY  PT (By licensed PT), OT (By licensed OT)     PT Frequency: x5 OT Frequency: x5            Contractures Contractures Info: Not present    Additional Factors Info  Allergies   Allergies Info: Oxycodon percocet acetomenaphin, percodan oxi-asparin           Current Medications (02/23/2018):  This is the current hospital active medication list No current facility-administered medications for this encounter.    Current Outpatient Medications  Medication Sig Dispense Refill  . acetaminophen (TYLENOL) 325 MG tablet Take 2 tablets (650 mg total) by mouth every 6 (six) hours as needed for mild pain (or Fever >/= 101).    Marland Kitchen acidophilus (RISAQUAD) CAPS capsule Take 1 capsule by mouth daily. 30 capsule 3  . amLODipine (NORVASC) 10 MG tablet Take 1 tablet (10 mg total) by mouth daily. 30 tablet 3  . aspirin EC 81 MG tablet Take 81 mg by mouth daily.    Marland Kitchen atorvastatin (LIPITOR) 20 MG tablet TAKE 1 TABLET BY MOUTH ONCE DAILY AT  6PM 30 tablet 3  . Cholecalciferol (VITAMIN D3) 400 units CAPS Take 400 Units  by mouth daily.    . cloNIDine (CATAPRES) 0.1 MG tablet Take 1 tablet (0.1 mg total) by mouth 2 (two) times daily. (Patient taking differently: Take 0.1 mg by mouth 2 (two) times daily. ) 60 tablet 0  . clopidogrel (PLAVIX) 75 MG tablet Take 1 tablet (75 mg total) by mouth daily.    . Cyanocobalamin (B-12) 5000 MCG CAPS Take 5,000 mcg by mouth daily.     Marland Kitchen guaifenesin (ROBITUSSIN) 100 MG/5ML syrup Take 400 mg by mouth 3 (three) times daily as  needed for cough.    Marland Kitchen ipratropium-albuterol (DUONEB) 0.5-2.5 (3) MG/3ML SOLN Take 3 mLs by nebulization every 6 (six) hours as needed. (Patient taking differently: Take 3 mLs by nebulization every 6 (six) hours as needed. For shortness of breath) 360 mL   . ketorolac (ACULAR) 0.4 % SOLN Place 1 drop into the left eye 3 (three) times daily.     . Multiple Minerals-Vitamins (LEG-GESIC PO) Take 0.5 tablets by mouth at bedtime as needed. For restless leg syndrome (legatrin pm)    . ondansetron (ZOFRAN-ODT) 4 MG disintegrating tablet Take 4 mg by mouth every 6 (six) hours as needed for nausea.    . pantoprazole (PROTONIX) 40 MG tablet Take 1 tablet (40 mg total) by mouth 2 (two) times daily before a meal.    . senna-docusate (SENOKOT-S) 8.6-50 MG tablet Take 1 tablet by mouth 2 (two) times daily as needed for mild constipation. 60 tablet 0  . traMADol (ULTRAM) 50 MG tablet Take 1 tablet (50 mg total) by mouth every 6 (six) hours as needed. (Patient taking differently: Take 50 mg by mouth every 6 (six) hours as needed for moderate pain. ) 20 tablet 0  . tretinoin (RETIN-A) 0.05 % cream Apply 1 application topically every other day. EVERY OTHER NIGHT       Discharge Medications: Please see discharge summary for a list of discharge medications.  Relevant Imaging Results:  Relevant Lab Results:   Additional Information SS# [id number]  Joana Reamer, [place]

## 2018-02-24 ENCOUNTER — Encounter: Payer: Self-pay | Admitting: Emergency Medicine

## 2018-02-24 DIAGNOSIS — S0990XD Unspecified injury of head, subsequent encounter: Secondary | ICD-10-CM | POA: Diagnosis not present

## 2018-02-24 NOTE — ED Notes (Signed)
Pt woke up to ck what time it was, Rn introduced self, informed pt time, informed in about 3 hrs she will be morning and she will be able to get up. Denies any needs at present pt has output of urine 422ml, in canister, pt is dry no distress noted

## 2018-02-24 NOTE — ED Notes (Signed)
Pt resting on bed no distress noted

## 2018-02-24 NOTE — Progress Notes (Signed)
Called [place] spoke to [place] she is calling  Myrtice Lauth admissions clerk- to obtain room and call report number and they will call this worker back shortly.  Received call back from [place] patient is to go to room [place] and call report number [phone] Patient to be transported by EMS consulted EDRN and ED secretary and [place] LCSW [phone]

## 2018-02-24 NOTE — ED Notes (Signed)
Skin tear to right lower arm, dressing in place upon assumption of care of patient. Dressing removed by soaking with normal saline, skin tear wet, call to [facility] to let them know.

## 2018-02-24 NOTE — ED Notes (Signed)
Son at bedside, eating breakfast with patient.

## 2018-02-24 NOTE — ED Notes (Signed)
Social worker reports pt is going to [facility], transported by EMS.

## 2018-02-24 NOTE — Discharge Instructions (Addendum)
patient was given fosfomycin for her urinary tract infection. Please continue all her medicines return for any further problems.

## 2018-02-25 LAB — URINE CULTURE
Culture: NO GROWTH
Special Requests: NORMAL

## 2018-03-07 ENCOUNTER — Observation Stay
Admission: EM | Admit: 2018-03-07 | Discharge: 2018-03-08 | Disposition: A | Discharge status: Skilled Nursing Facility | Payer: Medicare Other | Attending: Internal Medicine | Admitting: Internal Medicine

## 2018-03-07 ENCOUNTER — Other Ambulatory Visit: Payer: Self-pay

## 2018-03-07 ENCOUNTER — Encounter: Payer: Self-pay | Admitting: Emergency Medicine

## 2018-03-07 ENCOUNTER — Emergency Department: Payer: Medicare Other

## 2018-03-07 DIAGNOSIS — E559 Vitamin D deficiency, unspecified: Secondary | ICD-10-CM | POA: Insufficient documentation

## 2018-03-07 DIAGNOSIS — Z79899 Other long term (current) drug therapy: Secondary | ICD-10-CM | POA: Insufficient documentation

## 2018-03-07 DIAGNOSIS — Z87891 Personal history of nicotine dependence: Secondary | ICD-10-CM | POA: Insufficient documentation

## 2018-03-07 DIAGNOSIS — E538 Deficiency of other specified B group vitamins: Secondary | ICD-10-CM | POA: Insufficient documentation

## 2018-03-07 DIAGNOSIS — E785 Hyperlipidemia, unspecified: Secondary | ICD-10-CM | POA: Insufficient documentation

## 2018-03-07 DIAGNOSIS — J449 Chronic obstructive pulmonary disease, unspecified: Secondary | ICD-10-CM | POA: Diagnosis not present

## 2018-03-07 DIAGNOSIS — I739 Peripheral vascular disease, unspecified: Secondary | ICD-10-CM | POA: Insufficient documentation

## 2018-03-07 DIAGNOSIS — Z7902 Long term (current) use of antithrombotics/antiplatelets: Secondary | ICD-10-CM | POA: Insufficient documentation

## 2018-03-07 DIAGNOSIS — R42 Dizziness and giddiness: Secondary | ICD-10-CM | POA: Diagnosis not present

## 2018-03-07 DIAGNOSIS — I10 Essential (primary) hypertension: Secondary | ICD-10-CM | POA: Diagnosis not present

## 2018-03-07 DIAGNOSIS — Z8673 Personal history of transient ischemic attack (TIA), and cerebral infarction without residual deficits: Secondary | ICD-10-CM | POA: Insufficient documentation

## 2018-03-07 DIAGNOSIS — D649 Anemia, unspecified: Secondary | ICD-10-CM | POA: Diagnosis not present

## 2018-03-07 DIAGNOSIS — K219 Gastro-esophageal reflux disease without esophagitis: Secondary | ICD-10-CM | POA: Insufficient documentation

## 2018-03-07 DIAGNOSIS — Z95820 Peripheral vascular angioplasty status with implants and grafts: Secondary | ICD-10-CM | POA: Diagnosis not present

## 2018-03-07 DIAGNOSIS — Z96643 Presence of artificial hip joint, bilateral: Secondary | ICD-10-CM | POA: Insufficient documentation

## 2018-03-07 DIAGNOSIS — R531 Weakness: Secondary | ICD-10-CM

## 2018-03-07 DIAGNOSIS — Z7982 Long term (current) use of aspirin: Secondary | ICD-10-CM | POA: Diagnosis not present

## 2018-03-07 DIAGNOSIS — Z85828 Personal history of other malignant neoplasm of skin: Secondary | ICD-10-CM | POA: Insufficient documentation

## 2018-03-07 LAB — URINALYSIS, COMPLETE (UACMP) WITH MICROSCOPIC
BILIRUBIN URINE: NEGATIVE
Bacteria, UA: NONE SEEN
GLUCOSE, UA: NEGATIVE mg/dL
Hgb urine dipstick: NEGATIVE
KETONES UR: NEGATIVE mg/dL
Leukocytes, UA: NEGATIVE
Nitrite: NEGATIVE
PROTEIN: NEGATIVE mg/dL
Specific Gravity, Urine: 1.014 (ref 1.005–1.030)
pH: 6 (ref 5.0–8.0)

## 2018-03-07 LAB — CBC WITH DIFFERENTIAL/PLATELET
Basophils Absolute: 0.1 10*3/uL (ref 0–0.1)
Basophils Relative: 1 %
Eosinophils Absolute: 0.1 10*3/uL (ref 0–0.7)
Eosinophils Relative: 1 %
HCT: 23 % — ABNORMAL LOW (ref 35.0–47.0)
HEMOGLOBIN: 7.6 g/dL — AB (ref 12.0–16.0)
LYMPHS ABS: 1.1 10*3/uL (ref 1.0–3.6)
LYMPHS PCT: 15 %
MCH: 28.2 pg (ref 26.0–34.0)
MCHC: 32.9 g/dL (ref 32.0–36.0)
MCV: 85.9 fL (ref 80.0–100.0)
Monocytes Absolute: 0.5 10*3/uL (ref 0.2–0.9)
Monocytes Relative: 7 %
NEUTROS PCT: 76 %
Neutro Abs: 5.6 10*3/uL (ref 1.4–6.5)
Platelets: 449 10*3/uL — ABNORMAL HIGH (ref 150–440)
RBC: 2.68 MIL/uL — AB (ref 3.80–5.20)
RDW: 19.8 % — ABNORMAL HIGH (ref 11.5–14.5)
WBC: 7.4 10*3/uL (ref 3.6–11.0)

## 2018-03-07 LAB — BASIC METABOLIC PANEL
Anion gap: 9 (ref 5–15)
BUN: 33 mg/dL — AB (ref 6–20)
CO2: 27 mmol/L (ref 22–32)
Calcium: 8.5 mg/dL — ABNORMAL LOW (ref 8.9–10.3)
Chloride: 100 mmol/L — ABNORMAL LOW (ref 101–111)
Creatinine, Ser: 1 mg/dL (ref 0.44–1.00)
GFR calc Af Amer: 57 mL/min — ABNORMAL LOW (ref >60)
GFR calc non Af Amer: 49 mL/min — ABNORMAL LOW (ref >60)
GLUCOSE: 114 mg/dL — AB (ref 65–99)
POTASSIUM: 3.9 mmol/L (ref 3.5–5.1)
SODIUM: 136 mmol/L (ref 135–145)

## 2018-03-07 LAB — IRON AND TIBC
Iron: 11 ug/dL — ABNORMAL LOW (ref 28–170)
Saturation Ratios: 5 % — ABNORMAL LOW (ref 10.4–31.8)
TIBC: 206 ug/dL — ABNORMAL LOW (ref 250–450)
UIBC: 195 ug/dL

## 2018-03-07 LAB — FOLATE: Folate: 13.2 ng/mL (ref >5.9)

## 2018-03-07 LAB — RETICULOCYTES
RBC.: 2.36 MIL/uL — ABNORMAL LOW (ref 3.80–5.20)
RETIC CT PCT: 2.3 % (ref 0.4–3.1)
Retic Count, Absolute: 54.3 10*3/uL (ref 19.0–183.0)

## 2018-03-07 LAB — FERRITIN: Ferritin: 64 ng/mL (ref 11–307)

## 2018-03-07 LAB — PREPARE RBC (CROSSMATCH)

## 2018-03-07 LAB — MRSA PCR SCREENING: MRSA BY PCR: NEGATIVE

## 2018-03-07 MED ORDER — ACETAMINOPHEN 650 MG RE SUPP: 650.0000 mg | Freq: Four times a day (QID) | RECTAL | Status: DC | PRN

## 2018-03-07 MED ORDER — B-12 5000 MCG PO CAPS: 5000.0000 ug | ORAL_CAPSULE | Freq: Every day | ORAL | Status: DC

## 2018-03-07 MED ORDER — PANTOPRAZOLE SODIUM 40 MG PO TBEC
40.0000 mg | DELAYED_RELEASE_TABLET | Freq: Two times a day (BID) | ORAL | Status: DC
  Administered 2018-03-08 (×2): 40 mg via ORAL
  Filled 2018-03-07 (×2): qty 1

## 2018-03-07 MED ORDER — ATORVASTATIN CALCIUM 20 MG PO TABS
20.0000 mg | ORAL_TABLET | Freq: Every day | ORAL | Status: DC
  Administered 2018-03-08: 17:00:00 20 mg via ORAL
  Filled 2018-03-07: qty 1

## 2018-03-07 MED ORDER — CLONIDINE HCL 0.1 MG PO TABS
0.1000 mg | ORAL_TABLET | Freq: Two times a day (BID) | ORAL | Status: DC
  Administered 2018-03-07 – 2018-03-08 (×2): 0.1 mg via ORAL
  Filled 2018-03-07 (×2): qty 1

## 2018-03-07 MED ORDER — TRAMADOL HCL 50 MG PO TABS: 50.0000 mg | ORAL_TABLET | Freq: Four times a day (QID) | ORAL | Status: DC | PRN

## 2018-03-07 MED ORDER — CHOLECALCIFEROL 10 MCG (400 UNIT) PO TABS
400.0000 [IU] | ORAL_TABLET | Freq: Every day | ORAL | Status: DC
  Administered 2018-03-08: 10:00:00 400 [IU] via ORAL
  Filled 2018-03-07: qty 1

## 2018-03-07 MED ORDER — ONDANSETRON HCL 4 MG PO TABS: 4.0000 mg | ORAL_TABLET | Freq: Four times a day (QID) | ORAL | Status: DC | PRN

## 2018-03-07 MED ORDER — ACETAMINOPHEN 325 MG PO TABS: 650.0000 mg | ORAL_TABLET | Freq: Four times a day (QID) | ORAL | Status: DC | PRN

## 2018-03-07 MED ORDER — GUAIFENESIN 100 MG/5ML PO SOLN
400.0000 mg | Freq: Three times a day (TID) | ORAL | Status: DC | PRN
  Filled 2018-03-07: qty 20

## 2018-03-07 MED ORDER — VITAMIN B-12 1000 MCG PO TABS
5000.0000 ug | ORAL_TABLET | Freq: Every day | ORAL | Status: DC
  Administered 2018-03-08: 5000 ug via ORAL
  Filled 2018-03-07: qty 5

## 2018-03-07 MED ORDER — SENNOSIDES-DOCUSATE SODIUM 8.6-50 MG PO TABS
1.0000 | ORAL_TABLET | Freq: Two times a day (BID) | ORAL | Status: DC | PRN
Start: 2018-03-07 — End: 2018-03-08

## 2018-03-07 MED ORDER — SODIUM CHLORIDE 0.9 % IV BOLUS
500.0000 mL | Freq: Once | INTRAVENOUS | Status: AC
  Administered 2018-03-07: 500 mL via INTRAVENOUS

## 2018-03-07 MED ORDER — IPRATROPIUM-ALBUTEROL 0.5-2.5 (3) MG/3ML IN SOLN: 3.0000 mL | Freq: Four times a day (QID) | RESPIRATORY_TRACT | Status: DC | PRN

## 2018-03-07 MED ORDER — ONDANSETRON HCL 4 MG/2ML IJ SOLN: 4.0000 mg | Freq: Four times a day (QID) | INTRAMUSCULAR | Status: DC | PRN

## 2018-03-07 MED ORDER — SODIUM CHLORIDE 0.9 % IV SOLN
10.0000 mL/h | Freq: Once | INTRAVENOUS | Status: AC
  Administered 2018-03-07: 10 mL/h via INTRAVENOUS

## 2018-03-07 MED ORDER — KETOROLAC TROMETHAMINE 0.5 % OP SOLN
1.0000 [drp] | Freq: Three times a day (TID) | OPHTHALMIC | Status: DC
  Administered 2018-03-07 – 2018-03-08 (×3): 1 [drp] via OPHTHALMIC
  Filled 2018-03-07: qty 3

## 2018-03-07 MED ORDER — AMLODIPINE BESYLATE 10 MG PO TABS
10.0000 mg | ORAL_TABLET | Freq: Every day | ORAL | Status: DC
  Administered 2018-03-08: 10 mg via ORAL
  Filled 2018-03-07: qty 1

## 2018-03-07 NOTE — ED Triage Notes (Signed)
Pt to ED by EMS from [facility]. Pt had recent blood work and per staff her Hgb  Is 6.4.

## 2018-03-07 NOTE — H&P (Signed)
[facility] NAME: Dawn [name]    MR#:  [id number]  DATE OF BIRTH:  1927/12/21  DATE OF ADMISSION:  03/07/2018  PRIMARY CARE PHYSICIAN: Idelle Crouch, MD   REQUESTING/REFERRING PHYSICIAN: Dr. Brenton Grills  CHIEF COMPLAINT:   Chief Complaint  Patient presents with  . Abnormal Lab    HISTORY OF PRESENT ILLNESS:  Dawn [name]  is a 84 y.o. female with a known history of hypertension, hyperlipidemia, history of recent hospitalization for Norovirus, UTI, peripheral vascular disease, severe protein calorie malnutrition, history of COPD, stage II decubitus ulcer, essential hypertension who presents to the hospital from a skilled nursing facility due to generalized weakness and lethargy and noted to be significantly anemic.  Patient was recently in the hospital about 10-11 days ago after a fall noted to have a bleeding laceration to her head.  Patient lost a significant amount of blood but had her laceration sutured and she was discharged back home.  She came back to the emergency room where she continued to bleed and the dressing was readdressed and patient then was discharged to a skilled nursing facility in the next day.  Patient's hemoglobin on March 30 was 12.2.  At the skilled nursing facility she has been feeling increasingly weak and therefore sent to the ER and today hemoglobin is 7.4 and therefore hospitalist services were contacted for treatment evaluation.  Rectal exam done by the ER physician was Hemoccult negative.  As per the patient's son she has not had any rectal bleeding or hematuria or any other blood loss other than her fall 10 days ago when she had a bleeding wound in the head.  PAST MEDICAL HISTORY:   Past Medical History:  Diagnosis Date  . Cancer ([place])    skin cancer  . 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 Hypertension   . Sciatica     PAST SURGICAL  HISTORY:   Past Surgical History:  Procedure Laterality Date  . ABDOMINAL HYSTERECTOMY    . APPENDECTOMY    . CATARACT EXTRACTION, BILATERAL    . CHOLECYSTECTOMY    . ENDARTERECTOMY FEMORAL Right 04/19/2017   Procedure: ENDARTERECTOMY COMMON FEMORAL ARTERY, PROFUNDA  FEMORIS ARTERY, and  SUPERFICIAL FEMORAL ARTERY;  Surgeon: Algernon Huxley, MD;  Location: [facility] ORS;  Service: Vascular;  Laterality: Right;  . ENDARTERECTOMY FEMORAL Left 11/14/2017   Procedure: ENDARTERECTOMY FEMORAL;  Surgeon: Algernon Huxley, MD;  Location: [facility] ORS;  Service: Vascular;  Laterality: Left;  . HIP SURGERY Bilateral   . LOWER EXTREMITY ANGIOGRAM Right 04/19/2017   Procedure: LOWER EXTREMITY ANGIOGRAM WITH SUPERFICIAL FEMORAL ARTERY STENT PLACEMENT;  Surgeon: Algernon Huxley, MD;  Location: [facility] ORS;  Service: Vascular;  Laterality: Right;  . LOWER EXTREMITY ANGIOGRAPHY Right 04/18/2017   Procedure: Lower Extremity Angiography;  Surgeon: Algernon Huxley, MD;  Location: [place] [facility] LAB;  Service: Cardiovascular;  Laterality: Right;  . LOWER EXTREMITY ANGIOGRAPHY Left 07/12/2017   Procedure: Lower Extremity Angiography;  Surgeon: Algernon Huxley, MD;  Location: [place] [facility];  Service: Cardiovascular;  Laterality: Left;  . LOWER EXTREMITY ANGIOGRAPHY Left 11/09/2017   Procedure: LOWER EXTREMITY ANGIOGRAPHY;  Surgeon: Algernon Huxley, MD;  Location: [place] [facility];  Service: Cardiovascular;  Laterality: Left;  . LOWER EXTREMITY INTERVENTION  07/12/2017   Procedure: Lower Extremity Intervention;  Surgeon: Algernon Huxley, MD;  Location[facility] INVASIVE CV  LAB;  Service: Cardiovascular;;  . LOWER EXTREMITY INTERVENTION  11/09/2017   Procedure: LOWER EXTREMITY INTERVENTION;  Surgeon: Algernon Huxley, MD;  Location: [place] CV LAB;  Service: Cardiovascular;;  . RENAL ANGIOGRAPHY N/A 06/21/2017   Procedure: Renal Angiography;  Surgeon: Algernon Huxley, MD;  Location: [place] [facility];  Service: Cardiovascular;  Laterality:  N/A;  . SHOULDER SURGERY Bilateral   . TOTAL HIP ARTHROPLASTY Bilateral     SOCIAL HISTORY:   Social History   Tobacco Use  . Smoking status: Former Smoker    Years: 30.00    Types: Cigarettes    Last attempt to quit: 1980    Years since quitting: 39.3  . Smokeless tobacco: Never Used  Substance Use Topics  . Alcohol use: No    FAMILY HISTORY:   Family History  Problem Relation Age of Onset  . Diabetes Father   . Heart disease Father     DRUG ALLERGIES:   Allergies  Allergen Reactions  . Percocet [Oxycodone-Acetaminophen] Itching  . Percodan [Oxycodone-Aspirin] Itching    REVIEW OF SYSTEMS:   Review of Systems  Constitutional: Negative for fever and weight loss.  HENT: Negative for congestion, nosebleeds and tinnitus.   Eyes: Negative for blurred vision, double vision and redness.  Respiratory: Negative for cough, hemoptysis and shortness of breath.   Cardiovascular: Negative for chest pain, orthopnea, leg swelling and PND.  Gastrointestinal: Negative for abdominal pain, diarrhea, melena, nausea and vomiting.  Genitourinary: Negative for dysuria, hematuria and urgency.  Musculoskeletal: Negative for falls and joint pain.  Neurological: Positive for weakness (Generalized). Negative for dizziness, tingling, sensory change, focal weakness, seizures and headaches.  Endo/Heme/Allergies: Negative for polydipsia. Does not bruise/bleed easily.  Psychiatric/Behavioral: Negative for depression and memory loss. The patient is not nervous/anxious.     MEDICATIONS AT HOME:   Prior to Admission medications   Medication Sig Start Date End Date Taking? Authorizing Provider  acetaminophen (TYLENOL) 325 MG tablet Take 2 tablets (650 mg total) by mouth every 6 (six) hours as needed for mild pain (or Fever >/= 101). 01/14/18  Yes Wieting, Richard, MD  amLODipine (NORVASC) 10 MG tablet Take 1 tablet (10 mg total) by mouth daily. 11/23/17  Yes Algernon Huxley, MD  aspirin EC 81 MG  tablet Take 81 mg by mouth daily.   Yes [provider]  atorvastatin (LIPITOR) 20 MG tablet TAKE 1 TABLET BY MOUTH ONCE DAILY AT  6PM 08/29/17  Yes Dew, Erskine Squibb, MD  Cholecalciferol (VITAMIN D3) 400 units CAPS Take 400 Units by mouth daily.   Yes [provider]  cloNIDine (CATAPRES) 0.1 MG tablet Take 1 tablet (0.1 mg total) by mouth 2 (two) times daily. Patient taking differently: Take 0.1 mg by mouth 2 (two) times daily.  06/08/16  Yes Daymon Larsen, MD  clopidogrel (PLAVIX) 75 MG tablet Take 1 tablet (75 mg total) by mouth daily. 01/23/18  Yes Sudini, Alveta Heimlich, MD  Cyanocobalamin (B-12) 5000 MCG CAPS Take 5,000 mcg by mouth daily.    Yes [provider]  guaifenesin (ROBITUSSIN) 100 MG/5ML syrup Take 400 mg by mouth 3 (three) times daily as needed for cough.   Yes [provider]  ipratropium-albuterol (DUONEB) 0.5-2.5 (3) MG/3ML SOLN Take 3 mLs by nebulization every 6 (six) hours as needed. Patient taking differently: Take 3 mLs by nebulization every 6 (six) hours as needed. For shortness of breath 01/11/18  Yes Demetrios Loll, MD  ketorolac (ACULAR) 0.4 % SOLN Place 1  drop into the left eye 3 (three) times daily.    Yes [provider]  levofloxacin (LEVAQUIN) 500 MG tablet Take 500 mg by mouth daily. 03/01/18 03/10/18 Yes [provider]  Misc Natural Products (CYSTEX) LIQD Take 15 mLs by mouth daily.   Yes [provider]  Multiple Minerals-Vitamins (LEG-GESIC PO) Take 0.5 tablets by mouth at bedtime as needed. For restless leg syndrome (legatrin pm)   Yes [provider]  ondansetron (ZOFRAN-ODT) 4 MG disintegrating tablet Take 4 mg by mouth every 6 (six) hours as needed for nausea.   Yes [provider]  pantoprazole (PROTONIX) 40 MG tablet Take 1 tablet (40 mg total) by mouth 2 (two) times daily before a meal. 01/11/18  Yes Demetrios Loll, MD  senna-docusate (SENOKOT-S) 8.6-50 MG tablet Take 1 tablet by mouth 2 (two) times  daily as needed for mild constipation. 01/14/18  Yes Wieting, Richard, MD  traMADol (ULTRAM) 50 MG tablet Take 1 tablet (50 mg total) by mouth every 6 (six) hours as needed. Patient taking differently: Take 50 mg by mouth every 6 (six) hours as needed for moderate pain.  01/11/18  Yes Demetrios Loll, MD  acidophilus [facility]) CAPS capsule Take 1 capsule by mouth daily. Patient not taking: Reported on 03/07/2018 11/23/17   Algernon Huxley, MD      VITAL SIGNS:  Blood pressure (!) 130/51, pulse 91, temperature 97.9 F (36.6 C), temperature source Oral, resp. rate 18, SpO2 95 %.  PHYSICAL EXAMINATION:  Physical Exam  GENERAL:  84 y.o.-year-old patient lying in the bed in no acute distress.  EYES: Pupils equal, round, reactive to light and accommodation. No scleral icterus. Extraocular muscles intact.  Pale conjunctiva HEENT: Head atraumatic, normocephalic. Oropharynx and nasopharynx clear. No oropharyngeal erythema, moist oral mucosa  NECK:  Supple, no jugular venous distention. No thyroid enlargement, no tenderness.  LUNGS: Normal breath sounds bilaterally, no wheezing, rales, rhonchi. No use of accessory muscles of respiration.  CARDIOVASCULAR: S1, S2 RRR. No murmurs, rubs, gallops, clicks.  ABDOMEN: Soft, nontender, nondistended. Bowel sounds present. No organomegaly or mass.  EXTREMITIES: + 1-2 pitting edema bilaterally., cyanosis, or clubbing. + 2 pedal & radial pulses b/l.  Chronic venous stasis with signs of chronic venous stasis bilaterally. NEUROLOGIC: Cranial nerves II through XII are intact. No focal Motor or sensory deficits appreciated b/l.  Globally weak  pSYCHIATRIC: The patient is alert and oriented x 3.  SKIN: No obvious rash, lesion, or ulcer.   LABORATORY PANEL:   CBC Recent Labs  Lab 03/07/18 1502  WBC 7.4  HGB 7.6*  HCT 23.0*  PLT 449*   ------------------------------------------------------------------------------------------------------------------  Chemistries   Recent Labs  Lab 03/07/18 1502  NA 136  K 3.9  CL 100*  CO2 27  GLUCOSE 114*  BUN 33*  CREATININE 1.00  CALCIUM 8.5*   ------------------------------------------------------------------------------------------------------------------  Cardiac Enzymes No results for input(s): TROPONINI in the last 168 hours. ------------------------------------------------------------------------------------------------------------------  RADIOLOGY:  Dg Chest Portable 1 View  Result Date: 03/07/2018 CLINICAL DATA:  Low hemoglobin and hypertension EXAM: PORTABLE CHEST 1 VIEW COMPARISON:  02/23/2018 FINDINGS: Cardiac shadow is stable. The lungs are again hyperinflated. No focal infiltrate or sizable effusion is seen. No acute bony abnormality is noted. IMPRESSION: No active disease. Electronically Signed   By: Inez Catalina M.D.   On: 03/07/2018 16:34     IMPRESSION AND PLAN:   84 year old female known history of hypertension, hyperlipidemia, history of recent hospitalization for Norovirus, UTI, peripheral vascular disease, severe protein  calorie malnutrition, history of COPD, stage II decubitus ulcer, essential hypertension who presents to the hospital from a skilled nursing facility due to generalized weakness and lethargy and noted to be significantly anemic.  1.  Symptomatic anemia-this is the cause of patient's profound weakness and lethargy.  The likely source of her anemia is the significant blood loss from her recent traumatic event about 10-11 days ago.  Patient had a fall with a bleeding laceration to the head. -Patient's rectal exam was negative in the ER, she has had no further evidence of bleeding.  I will check iron studies, B12, folate. - Patient will be transfused and will follow hemoglobin.  Hold her aspirin, Plavix for now.  2.  Essential hypertension-continue clonidine, Norvasc.  3.  Hyperlipidemia-continue atorvastatin  4.  GERD-continue Protonix.    5.  History of COPD-no  acute exacerbation.  Continue as needed duo nebs.  All the records are reviewed and case discussed with ED provider. Management plans discussed with the patient, family and they are in agreement.  CODE STATUS: Full code  TOTAL TIME TAKING CARE OF THIS PATIENT: 45 minutes.    Henreitta Leber M.D on 03/07/2018 at 5:35 PM  Between 7am to 6pm - Pager - [phone]  After 6pm go to [email or web address] - password [email or web address] [place] [facility]  Office  [phone]  CC: Primary care physician; Idelle Crouch, MD

## 2018-03-07 NOTE — ED Provider Notes (Signed)
[facility] Emergency Department Provider Note  ____________________________________________  Time seen: Approximately 2:57 PM  I have reviewed the triage vital signs and the nursing notes.   HISTORY  Chief Complaint Abnormal Lab    HPI Dawn [name] is a 84 y.o. female comes to the ED complaining of generalized weakness and getting lightheaded whenever she stands up which improves whenever she sits back down or lays down. No falls or recent trauma. No chest pain or shortness of breath. Been eating and drinking okay, but has not eaten today. Overall she is finding that she is just very weak and experiencing a precipitous functional decline as a result. Symptoms are gradual onset and constant and severe at this point without specific alleviating factors.  Outpatient labs at [facility] show a hemoglobin of 6.4 compared to a baseline of 10-11 within just the last couple months.      Past Medical History:  Diagnosis Date  . Cancer ([place])    skin cancer  . 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 Hypertension   . Sciatica      Patient Active Problem List   Diagnosis Date Noted  . TIA (transient ischemic attack) 01/22/2018  . Pressure injury of skin 01/04/2018  . Sepsis ([place]) 01/03/2018  . Protein-calorie malnutrition, severe 11/14/2017  . Atherosclerosis of native arteries of extremity with rest pain ([place]) 11/09/2017  . Ischemic leg 11/09/2017  . Atherosclerotic peripheral vascular disease with rest pain ([place]) 11/09/2017  . Atypical chest pain 09/21/2017  . Renal artery stenosis ([place]) 06/15/2017  . Hyperlipidemia 05/09/2017  . Essential hypertension 10/17/2016  . Carotid stenosis 10/17/2016  . PVD (peripheral vascular disease) ([place]) 10/17/2016  . B12 deficiency 07/19/2016  . Vitamin D deficiency, unspecified 07/19/2016  . History of right hip replacement 11/04/2015  . Aftercare  following bilateral hip joint replacement surgery 11/05/2014  . SCC (squamous cell carcinoma) 05/21/2014  . COPD with asthma ([place]) 03/16/2014  . S/P hip replacement 10/03/2012     Past Surgical History:  Procedure Laterality Date  . ABDOMINAL HYSTERECTOMY    . APPENDECTOMY    . CATARACT EXTRACTION, BILATERAL    . CHOLECYSTECTOMY    . ENDARTERECTOMY FEMORAL Right 04/19/2017   Procedure: ENDARTERECTOMY COMMON FEMORAL ARTERY, PROFUNDA  FEMORIS ARTERY, and  SUPERFICIAL FEMORAL ARTERY;  Surgeon: Algernon Huxley, MD;  Location: [facility] ORS;  Service: Vascular;  Laterality: Right;  . ENDARTERECTOMY FEMORAL Left 11/14/2017   Procedure: ENDARTERECTOMY FEMORAL;  Surgeon: Algernon Huxley, MD;  Location: [facility] ORS;  Service: Vascular;  Laterality: Left;  . HIP SURGERY Bilateral   . LOWER EXTREMITY ANGIOGRAM Right 04/19/2017   Procedure: LOWER EXTREMITY ANGIOGRAM WITH SUPERFICIAL FEMORAL ARTERY STENT PLACEMENT;  Surgeon: Algernon Huxley, MD;  Location: [facility] ORS;  Service: Vascular;  Laterality: Right;  . LOWER EXTREMITY ANGIOGRAPHY Right 04/18/2017   Procedure: Lower Extremity Angiography;  Surgeon: Algernon Huxley, MD;  Location: [place] [facility];  Service: Cardiovascular;  Laterality: Right;  . LOWER EXTREMITY ANGIOGRAPHY Left 07/12/2017   Procedure: Lower Extremity Angiography;  Surgeon: Algernon Huxley, MD;  Location: [place] [facility];  Service: Cardiovascular;  Laterality: Left;  . LOWER EXTREMITY ANGIOGRAPHY Left 11/09/2017   Procedure: LOWER EXTREMITY ANGIOGRAPHY;  Surgeon: Algernon Huxley, MD;  Location: [place] [facility];  Service: Cardiovascular;  Laterality: Left;  . LOWER EXTREMITY INTERVENTION  07/12/2017   Procedure: Lower Extremity Intervention;  Surgeon:  Algernon Huxley, MD;  Location: [place] [facility];  Service: Cardiovascular;;  . LOWER EXTREMITY INTERVENTION  11/09/2017   Procedure: LOWER EXTREMITY INTERVENTION;  Surgeon: Algernon Huxley, MD;  Location: [place] [facility];  Service:  Cardiovascular;;  . RENAL ANGIOGRAPHY N/A 06/21/2017   Procedure: Renal Angiography;  Surgeon: Algernon Huxley, MD;  Location: [place] [facility];  Service: Cardiovascular;  Laterality: N/A;  . SHOULDER SURGERY Bilateral   . TOTAL HIP ARTHROPLASTY Bilateral      Prior to Admission medications   Medication Sig Start Date End Date Taking? Authorizing Provider  acetaminophen (TYLENOL) 325 MG tablet Take 2 tablets (650 mg total) by mouth every 6 (six) hours as needed for mild pain (or Fever >/= 101). 01/14/18   Loletha Grayer, MD  acidophilus (RISAQUAD) CAPS capsule Take 1 capsule by mouth daily. 11/23/17   Algernon Huxley, MD  amLODipine (NORVASC) 10 MG tablet Take 1 tablet (10 mg total) by mouth daily. 11/23/17   Algernon Huxley, MD  aspirin EC 81 MG tablet Take 81 mg by mouth daily.    [provider]  atorvastatin (LIPITOR) 20 MG tablet TAKE 1 TABLET BY MOUTH ONCE DAILY AT  6PM 08/29/17   Algernon Huxley, MD  Cholecalciferol (VITAMIN D3) 400 units CAPS Take 400 Units by mouth daily.    [provider]  cloNIDine (CATAPRES) 0.1 MG tablet Take 1 tablet (0.1 mg total) by mouth 2 (two) times daily. Patient taking differently: Take 0.1 mg by mouth 2 (two) times daily.  06/08/16   Daymon Larsen, MD  clopidogrel (PLAVIX) 75 MG tablet Take 1 tablet (75 mg total) by mouth daily. 01/23/18   Hillary Bow, MD  Cyanocobalamin (B-12) 5000 MCG CAPS Take 5,000 mcg by mouth daily.     [provider]  guaifenesin (ROBITUSSIN) 100 MG/5ML syrup Take 400 mg by mouth 3 (three) times daily as needed for cough.    [provider]  ipratropium-albuterol (DUONEB) 0.5-2.5 (3) MG/3ML SOLN Take 3 mLs by nebulization every 6 (six) hours as needed. Patient taking differently: Take 3 mLs by nebulization every 6 (six) hours as needed. For shortness of breath 01/11/18   Demetrios Loll, MD  ketorolac (ACULAR) 0.4 % SOLN Place 1 drop into the left eye 3 (three) times daily.     [provider]   Multiple Minerals-Vitamins (LEG-GESIC PO) Take 0.5 tablets by mouth at bedtime as needed. For restless leg syndrome (legatrin pm)    [provider]  ondansetron (ZOFRAN-ODT) 4 MG disintegrating tablet Take 4 mg by mouth every 6 (six) hours as needed for nausea.    [provider]  pantoprazole (PROTONIX) 40 MG tablet Take 1 tablet (40 mg total) by mouth 2 (two) times daily before a meal. 01/11/18   Demetrios Loll, MD  senna-docusate (SENOKOT-S) 8.6-50 MG tablet Take 1 tablet by mouth 2 (two) times daily as needed for mild constipation. 01/14/18   Loletha Grayer, MD  traMADol (ULTRAM) 50 MG tablet Take 1 tablet (50 mg total) by mouth every 6 (six) hours as needed. Patient taking differently: Take 50 mg by mouth every 6 (six) hours as needed for moderate pain.  01/11/18   Demetrios Loll, MD  tretinoin (RETIN-A) 0.05 % cream Apply 1 application topically every other day. EVERY OTHER NIGHT    [provider]     Allergies Percocet [oxycodone-acetaminophen] and Percodan [oxycodone-aspirin]   Family History  Problem Relation Age of Onset  . Diabetes  Father   . Heart disease Father     Social History Social History   Tobacco Use  . Smoking status: Former Smoker    Types: Cigarettes    Last attempt to quit: 1980    Years since quitting: 39.3  . Smokeless tobacco: Never Used  Substance Use Topics  . Alcohol use: No  . Drug use: No    Review of Systems  Constitutional:   No fever or chills.  ENT:   No sore throat. No rhinorrhea. Cardiovascular:   No chest pain or syncope. Respiratory:   No dyspnea or cough. Gastrointestinal:   Negative for abdominal pain, vomiting and diarrhea.  Musculoskeletal:   Negative for focal pain or swelling All other systems reviewed and are negative except as documented above in ROS and HPI.  ____________________________________________   PHYSICAL EXAM:  VITAL SIGNS: ED Triage Vitals  Enc Vitals Group     BP 03/07/18 1422 (!)  130/51     Pulse Rate 03/07/18 1422 91     Resp 03/07/18 1422 18     Temp 03/07/18 1422 97.9 F (36.6 C)     Temp Source 03/07/18 1422 Oral     SpO2 03/07/18 1422 95 %     Weight --      Height --      Head Circumference --      Peak Flow --      Pain Score 03/07/18 1423 0     Pain Loc --      Pain Edu? --      Excl. in [place]? --     Vital signs reviewed, nursing assessments reviewed.   Constitutional:   Alert and oriented. Well appearing and in no distress. Eyes:   conjunctival pallor. EOMI. PERRL. ENT      Head:   Normocephalic and atraumatic.      Nose:   No congestion/rhinnorhea.       Mouth/Throat:   dry mucous membranes, no pharyngeal erythema. No peritonsillar mass.       Neck:   No meningismus. Full ROM. Hematological/Lymphatic/Immunilogical:   No cervical lymphadenopathy. Cardiovascular:   RRR. Symmetric bilateral radial and DP pulses.  No murmurs.  Respiratory:   Normal respiratory effort without tachypnea/retractions. Breath sounds are clear and equal bilaterally. No wheezes/rales/rhonchi. Gastrointestinal:   Soft with suprapubic tenderness. Non distended. There is no CVA tenderness.  No rebound, rigidity, or guarding.rectal exam performed with nurse Apolonio Schneiders at bedside. Brown stool, Hemoccult negative, controls okay Genitourinary:   deferred Musculoskeletal:   Normal range of motion in all extremities. No joint effusions.  No lower extremity tenderness.  No edema. Neurologic:   Normal speech and language.  Motor grossly intact. No acute focal neurologic deficits are appreciated.  Skin:    Skin is warm, dry and intact. No rash noted.  No petechiae, purpura, or bullae.  ____________________________________________    LABS (pertinent positives/negatives) (all labs ordered are listed, but only abnormal results are displayed) Labs Reviewed  CBC WITH DIFFERENTIAL/PLATELET - Abnormal; Notable for the following components:      Result Value   RBC 2.68 (*)    Hemoglobin  7.6 (*)    HCT 23.0 (*)    RDW 19.8 (*)    Platelets 449 (*)    All other components within normal limits  BASIC METABOLIC PANEL - Abnormal; Notable for the following components:   Chloride 100 (*)    Glucose, Bld 114 (*)    BUN 33 (*)  Calcium 8.5 (*)    GFR calc non Af Amer 49 (*)    GFR calc Af Amer 57 (*)    All other components within normal limits  URINALYSIS, COMPLETE (UACMP) WITH MICROSCOPIC - Abnormal; Notable for the following components:   Color, Urine YELLOW (*)    APPearance CLEAR (*)    Squamous Epithelial / LPF 0-5 (*)    All other components within normal limits  URINE CULTURE  TYPE AND SCREEN  PREPARE RBC (CROSSMATCH)   ____________________________________________   EKG  interpreted by me Sinus rhythm rate of 91, normal axis and intervals. Normal QRS ST segments and T waves.  ____________________________________________    [id number]  Dg Chest Portable 1 View  Result Date: 03/07/2018 CLINICAL DATA:  Low hemoglobin and hypertension EXAM: PORTABLE CHEST 1 VIEW COMPARISON:  02/23/2018 FINDINGS: Cardiac shadow is stable. The lungs are again hyperinflated. No focal infiltrate or sizable effusion is seen. No acute bony abnormality is noted. IMPRESSION: No active disease. Electronically Signed   By: Inez Catalina M.D.   On: 03/07/2018 16:34    ____________________________________________   PROCEDURES .Critical Care Performed by: Carrie Mew, MD Authorized by: Carrie Mew, MD   Critical care provider statement:    Critical care time (minutes):  30   Critical care time was exclusive of:  Separately billable procedures and treating other patients   Critical care was necessary to treat or prevent imminent or life-threatening deterioration of the following conditions:  Circulatory failure   Critical care was time spent personally by me on the following activities:  Development of treatment plan with patient or surrogate, discussions with  consultants, evaluation of patient's response to treatment, examination of patient, obtaining history from patient or surrogate, ordering and performing treatments and interventions, ordering and review of laboratory studies, ordering and review of radiographic studies, pulse oximetry, re-evaluation of patient's condition and review of old charts    ____________________________________________  DIFFERENTIAL DIAGNOSIS   symptomatic anemia, GI bleed, malnutrition  CLINICAL IMPRESSION / ASSESSMENT AND PLAN / ED COURSE  Pertinent labs & imaging results that were available during my care of the patient were reviewed by me and considered in my medical decision making (see chart for details).      Clinical Course as of Mar 07 1648  Thu Mar 07, 2018  1454 P/w gen weakness, orthostatic dizziness. Outpt labs reported to be hb 6.4 concern for acute symptomatic anemia. Will check hemoccult. Check labs. Ivf for initial rehydration.    [PS]  1619 2 point hb drop in 2 weeks. Hemoccult negative. Will transfuse given symptoms. Plan to admit.   Hemoglobin(!): 7.6 [PS]  1624 patient and son updated on results. Blood pressure remained stable but patient is severely weakened   [PS]    Clinical Course User Index [PS] Carrie Mew, MD     ____________________________________________   FINAL CLINICAL IMPRESSION(S) / ED DIAGNOSES    Final diagnoses:  Symptomatic anemia  Orthostatic dizziness  Generalized weakness     ED Discharge Orders    None      Portions of this note were generated with dragon dictation software. Dictation errors may occur despite best attempts at proofreading.    Carrie Mew, MD 03/07/18 [phone]

## 2018-03-07 NOTE — Progress Notes (Signed)
   [facility] Day: 0 days Dawn [name] is a 84 y.o. female presenting with Abnormal Lab .   Advance care planning discussed with patient  with additional Family at bedside. All questions in regards to overall condition and expected prognosis answered.  Discussed with the patient's son and also patient at bedside about goals of care regarding cardiopulmonary resuscitation.  Patient says that she wants to have full scope of care, but does not want to be kept alive on machines.  the decision was made to continue current code status  CODE STATUS: full Time spent: 16 minutes

## 2018-03-08 DIAGNOSIS — D649 Anemia, unspecified: Secondary | ICD-10-CM | POA: Diagnosis not present

## 2018-03-08 LAB — URINE CULTURE: Culture: NO GROWTH

## 2018-03-08 LAB — BASIC METABOLIC PANEL
Anion gap: 5 (ref 5–15)
BUN: 24 mg/dL — AB (ref 6–20)
CALCIUM: 8.2 mg/dL — AB (ref 8.9–10.3)
CO2: 27 mmol/L (ref 22–32)
Chloride: 105 mmol/L (ref 101–111)
Creatinine, Ser: 0.84 mg/dL (ref 0.44–1.00)
GFR calc Af Amer: >60 mL/min (ref >60)
GLUCOSE: 84 mg/dL (ref 65–99)
Potassium: 3.6 mmol/L (ref 3.5–5.1)
SODIUM: 137 mmol/L (ref 135–145)

## 2018-03-08 LAB — VITAMIN B12

## 2018-03-08 LAB — CBC
HCT: 26.3 % — ABNORMAL LOW (ref 35.0–47.0)
Hemoglobin: 8.8 g/dL — ABNORMAL LOW (ref 12.0–16.0)
MCH: 29.4 pg (ref 26.0–34.0)
MCHC: 33.6 g/dL (ref 32.0–36.0)
MCV: 87.5 fL (ref 80.0–100.0)
Platelets: 371 10*3/uL (ref 150–440)
RBC: 3.01 MIL/uL — ABNORMAL LOW (ref 3.80–5.20)
RDW: 18 % — AB (ref 11.5–14.5)
WBC: 5.7 10*3/uL (ref 3.6–11.0)

## 2018-03-08 MED ORDER — OCUVITE-LUTEIN PO CAPS
1.0000 | ORAL_CAPSULE | Freq: Every day | ORAL | Status: DC
  Filled 2018-03-08: qty 1

## 2018-03-08 MED ORDER — FERROUS SULFATE 325 (65 FE) MG PO TABS: 325.0000 mg | ORAL_TABLET | Freq: Two times a day (BID) | ORAL | 3 refills | Status: DC

## 2018-03-08 MED ORDER — FERROUS SULFATE 325 (65 FE) MG PO TABS
325.0000 mg | ORAL_TABLET | Freq: Two times a day (BID) | ORAL | Status: DC
  Administered 2018-03-08 (×2): 325 mg via ORAL
  Filled 2018-03-08 (×2): qty 1

## 2018-03-08 MED ORDER — ADULT MULTIVITAMIN W/MINERALS CH: 1.0000 | ORAL_TABLET | Freq: Every day | ORAL | Status: DC

## 2018-03-08 NOTE — Progress Notes (Signed)
Physical Therapy Evaluation Patient Details Name: Dawn [name] MRN: [id number] DOB: 1928-01-15 Today's Date: 03/08/2018   History of Present Illness  Dawn [name]  is a 84 y.o. female with a known history of hypertension, hyperlipidemia, history of recent hospitalization for Norovirus, UTI, peripheral vascular disease, severe protein calorie malnutrition, history of COPD, stage II decubitus ulcer, essential hypertension who presents to the hospital from a skilled nursing facility due to generalized weakness and lethargy and noted to be significantly anemic.  Patient was recently in the hospital about 10-11 days ago after a fall noted to have a bleeding laceration to her head.  Patient lost a significant amount of blood but had her laceration sutured and she was discharged back home.  She came back to the emergency room where she continued to bleed and the dressing was readdressed and patient then was discharged to a skilled nursing facility in the next day.  Patient's hemoglobin on March 30 was 12.2.  At the skilled nursing facility she has been feeling increasingly weak and therefore sent to the ER and today hemoglobin is 7.4 and therefore hospitalist services were contacted for treatment evaluation.  Rectal exam done by the ER physician was Hemoccult negative.  As per the patient's son she has not had any rectal bleeding or hematuria or any other blood loss other than her fall 10 days ago when she had a bleeding wound in the head.  Clinical Impression  Pt admitted with above diagnosis. Pt currently with functional limitations due to the deficits listed below (see PT Problem List).  Pt moves very slowly during bed mobility. Use of bed rails and HOB elevated. Pt requires cues for safe hand placement. She requires elevated bed to come to standing. Bilateral knee hyperextension and pt rests back of knees on the bed. Attempted marches in standing. Pt able to clear her heels but unable to fully lift  one foot off the floor. Unable/unsafe to attempt ambulation at this time. Pt transferred from bed to recliner with minA+1. Pt will benefit from PT services to address deficits in strength, balance, and mobility in order to return to full function at home.       Follow Up Recommendations SNF    Equipment Recommendations  None recommended by PT    Recommendations for Other Services       Precautions / Restrictions Precautions Precautions: Fall Restrictions Weight Bearing Restrictions: No      Mobility  Bed Mobility Overal bed mobility: Needs Assistance Bed Mobility: Supine to Sit     Supine to sit: Min assist     General bed mobility comments: Pt moves very slowly. Use of bed rails and HOB elevated  Transfers Overall transfer level: Needs assistance Equipment used: Rolling walker (2 wheeled) Transfers: Sit to/from Stand Sit to Stand: Min assist         General transfer comment: Pt requires cues for safe hand placement. She requires elevated bed to come to standing. Bilateral knee hyperextension and pt rests back of knees on the bed. Attempted marches in standing. Pt able to clear her heels but unable to fully lift one foot off the floor. Unable/unsafe to attempt ambulation at this time. Pt transferred from bed to recliner with minA+1  Ambulation/Gait                Stairs            Wheelchair Mobility    Modified Rankin (Stroke Patients Only)  Balance Overall balance assessment: Needs assistance Sitting-balance support: No upper extremity supported Sitting balance-Leahy Scale: Fair     Standing balance support: Bilateral upper extremity supported Standing balance-Leahy Scale: Poor                               Pertinent Vitals/Pain Pain Assessment: No/denies pain    Home Living Family/patient expects to be discharged to:: [place]: Children;Other (Comment)(Son) Available Help at  Discharge: Family;Available 24 hours/day Type of Home: House Home Access: Stairs to enter Entrance Stairs-Rails: None Entrance Stairs-Number of Steps: 1 Home Layout: One level Home Equipment: Walker - 2 wheels;Cane - single point;Transport chair;Bedside commode;Tub bench;Wheelchair - manual      Prior Function Level of Independence: Needs assistance   Gait / Transfers Assistance Needed: Assistance required with gait at SNF using a rolling walker  ADL's / Homemaking Assistance Needed: Requiring assist with ADLs/IADLs at SNF        Hand Dominance   Dominant Hand: Right    Extremity/Trunk Assessment   Upper Extremity Assessment Upper Extremity Assessment: Generalized weakness    Lower Extremity Assessment Lower Extremity Assessment: Generalized weakness       Communication   Communication: No difficulties  Cognition Arousal/Alertness: Awake/alert Behavior During Therapy: WFL for tasks assessed/performed Overall Cognitive Status: Within Functional Limits for tasks assessed                                        General Comments      Exercises     Assessment/Plan    PT Assessment Patient needs continued PT services  PT Problem List Decreased strength;Decreased activity tolerance;Decreased balance;Decreased mobility       PT Treatment Interventions Gait training;DME instruction;Functional mobility training;Therapeutic activities;Therapeutic exercise;Balance training;Neuromuscular re-education;Wheelchair mobility training;Patient/family education    PT Goals (Current goals can be found in the Care Plan section)  Acute Rehab PT Goals Patient Stated Goal: Return to prior level of function at home PT Goal Formulation: With patient/family Time For Goal Achievement: 03/22/18 Potential to Achieve Goals: Good    Frequency Min 2X/week   Barriers to discharge        Co-evaluation               AM-PAC PT "6 Clicks" Daily Activity  Outcome  Measure Difficulty turning over in bed (including adjusting bedclothes, sheets and blankets)?: Unable Difficulty moving from lying on back to sitting on the side of the bed? : Unable Difficulty sitting down on and standing up from a chair with arms (e.g., wheelchair, bedside commode, etc,.)?: Unable Help needed moving to and from a bed to chair (including a wheelchair)?: A Lot Help needed walking in hospital room?: Total Help needed climbing 3-5 steps with a railing? : Total 6 Click Score: 7    End of Session Equipment Utilized During Treatment: Gait belt Activity Tolerance: Patient tolerated treatment well Patient left: in chair;with call bell/phone within reach;with chair alarm set;with family/visitor present   PT Visit Diagnosis: Unsteadiness on feet (R26.81);Muscle weakness (generalized) (M62.81);History of falling (Z91.81);Difficulty in walking, not elsewhere classified (R26.2)    Time: 9563-8756 PT Time Calculation (min) (ACUTE ONLY): 27 min   Charges:   PT Evaluation $PT Eval Low Complexity: 1 Low PT Treatments $Therapeutic Activity: 8-22 mins   PT G Codes:  Lyndel Safe Lavar Rosenzweig PT, DPT    Dearis Danis 03/08/2018, 1:31 PM

## 2018-03-08 NOTE — Progress Notes (Signed)
Pt left via EMS at [phone] for [facility]. No distress noted at discharge.

## 2018-03-08 NOTE — Progress Notes (Signed)
Initial Nutrition Assessment  DOCUMENTATION CODES:   Severe malnutrition in context of chronic illness  INTERVENTION:   Vital Cuisine TID, each supplement provides 520kcal and 22g of protein.   Magic cup TID with meals, each supplement provides 290 kcal and 9 grams of protein  MVI daily  Ocuvite daily for wound healing (provides zinc, vitamin A, vitamin C, Vitamin E, copper, and selenium)  Dysphagia 3/nectar thick diet  NUTRITION DIAGNOSIS:   Severe Malnutrition related to chronic illness as evidenced by severe fat depletion, severe muscle depletion.  GOAL:   Patient will meet greater than or equal to 90% of their needs  MONITOR:   PO intake, Supplement acceptance, Labs, Weight trends, I & O's, Skin  REASON FOR ASSESSMENT:   Malnutrition Screening Tool    ASSESSMENT:   84 year old female known history of hypertension, hyperlipidemia, history of recent hospitalization for Norovirus, UTI, peripheral vascular disease, severe protein calorie malnutrition, history of COPD, stage II decubitus ulcer, essential hypertension who presents to the hospital from a skilled nursing facility due to generalized weakness and lethargy and noted to be significantly anemic.   RD familar with this pt from multiple previous admits. Pt with increased appetite and oral intake since going to [facility]. Pt eats 3 meals per day and drinks nectar thick supplements. Pt loves vital cuisine. Pt seen multiple times by SLP and recommended for dysphagia 3/ nectar thick liquid diet. Per chart, pt has had some wt gain over the past month. RD will order supplements and MVI. Pt noted to have multiple previous wounds on last admit; no wounds noted this admit. RD will add Ocuvite to provide vitamin C and zinc. Pt eating 60-100% of meals this admit. RD will change pt to dysphagia 3/nectar thick diet. Recommend SLP evaluation prior to upgrading diet. Pt with iron deficiency; on ferrous sulfate. Spoke to RN, pt to  discharge today.    Medications reviewed and include: vitamin D, ferrous sulfate, protonix  Labs reviewed: Iron 11(L), TIBC 206(H) trending up, ferritin 64 trending down- 4/11 Hgb 8.8(L), Hct 26.3(L)  Nutrition-Focused physical exam completed. Findings are severe fat and muscle depletions over entire body, and moderate edema.   Diet Order:  DIET DYS 3 Room service appropriate? Yes with Assist; Fluid consistency: Nectar Thick  EDUCATION NEEDS:   No education needs have been identified at this time  Skin:  Reviewed RN Assessment  Last BM:  03/05/2018  Height:   Ht Readings from Last 1 Encounters:  03/07/18 5' 2.5" (1.588 m)    Weight:   Wt Readings from Last 1 Encounters:  03/07/18 98 lb 12.8 oz (44.8 kg)    Ideal Body Weight:  51.14 kg  BMI:  Body mass index is 17.78 kg/m.  Estimated Nutritional Needs:   Kcal:  1200-1400kcal/day   Protein:  67-76g/day   Fluid:  1.1L/day   Koleen Distance MS, RD, LDN Pager #[phone] After Hours Pager: [phone]

## 2018-03-08 NOTE — Care Management (Signed)
Son requested personal care service list  - given. Hopes to discharge from [facility] and go home in a few weeks. States he already has a hospital bed Shelbie Ammons RN MSN [place] [facility] [phone]

## 2018-03-08 NOTE — NC FL2 (Signed)
[place] LEVEL OF CARE SCREENING TOOL     IDENTIFICATION  Patient Name: Dawn [name] Birthdate: 1927/12/27 Sex: female Admission Date (Current Location): 03/07/2018  [place] and [place] Number:  Engineering geologist and Address:  [facility], [address] [place], [place] [address]      Provider Number: [id number]  Attending Physician Name and Address:  Vaughan Basta, *  Relative Name and Phone Number:       Current Level of Care: Hospital Recommended Level of Care: [place] Prior Approval Number:    Date Approved/Denied:   PASRR Number: ([id number] )  Discharge Plan: SNF    Current Diagnoses: Patient Active Problem List   Diagnosis Date Noted  . Symptomatic anemia 03/07/2018  . TIA (transient ischemic attack) 01/22/2018  . Pressure injury of skin 01/04/2018  . Sepsis ([place]) 01/03/2018  . Protein-calorie malnutrition, severe 11/14/2017  . Atherosclerosis of native arteries of extremity with rest pain ([place]) 11/09/2017  . Ischemic leg 11/09/2017  . Atherosclerotic peripheral vascular disease with rest pain ([place]) 11/09/2017  . Atypical chest pain 09/21/2017  . Renal artery stenosis ([place]) 06/15/2017  . Hyperlipidemia 05/09/2017  . Essential hypertension 10/17/2016  . Carotid stenosis 10/17/2016  . PVD (peripheral vascular disease) ([place]) 10/17/2016  . B12 deficiency 07/19/2016  . Vitamin D deficiency, unspecified 07/19/2016  . History of right hip replacement 11/04/2015  . Aftercare following bilateral hip joint replacement surgery 11/05/2014  . SCC (squamous cell carcinoma) 05/21/2014  . COPD with asthma ([place]) 03/16/2014  . S/P hip replacement 10/03/2012    Orientation RESPIRATION BLADDER Height & Weight     Self, Time, Situation, Place  Normal Incontinent Weight: 98 lb 12.8 oz (44.8 kg) Height:  5' 2.5" (158.8 cm)  BEHAVIORAL SYMPTOMS/MOOD NEUROLOGICAL BOWEL NUTRITION STATUS       Incontinent Diet(Diet: Low Sodium/ Heart Healthy )  AMBULATORY STATUS COMMUNICATION OF NEEDS Skin   Extensive Assist Verbally Normal                       Personal Care Assistance Level of Assistance  Bathing, Feeding, Dressing Bathing Assistance: Limited assistance Feeding assistance: Independent Dressing Assistance: Limited assistance     Functional Limitations Info  Sight, Hearing, Speech Sight Info: Adequate Hearing Info: Impaired Speech Info: Adequate    SPECIAL CARE FACTORS FREQUENCY  PT (By licensed PT), OT (By licensed OT)     PT Frequency: (5) OT Frequency: (5)            Contractures      Additional Factors Info  Code Status, Allergies Code Status Info: (Full Code. ) Allergies Info: (Percocet Oxycodone-acetaminophen, Percodan Oxycodone-aspirin)           Current Medications (03/08/2018):  This is the current hospital active medication list Current Facility-Administered Medications  Medication Dose Route Frequency Provider Last Rate Last Dose  . acetaminophen (TYLENOL) tablet 650 mg  650 mg Oral Q6H PRN Henreitta Leber, MD       Or  . acetaminophen (TYLENOL) suppository 650 mg  650 mg Rectal Q6H PRN Henreitta Leber, MD      . amLODipine (NORVASC) tablet 10 mg  10 mg Oral Daily Henreitta Leber, MD   10 mg at 03/08/18 0938  . atorvastatin (LIPITOR) tablet 20 mg  20 mg Oral q1800 Henreitta Leber, MD      . cholecalciferol (VITAMIN D) tablet 400 Units  400 Units Oral Daily Armstrong, Harriette Ohara  J, MD   400 Units at 03/08/18 0939  . cloNIDine (CATAPRES) tablet 0.1 mg  0.1 mg Oral BID Henreitta Leber, MD   0.1 mg at 03/08/18 0939  . ferrous sulfate tablet 325 mg  325 mg Oral BID WC Vaughan Basta, MD   325 mg at 03/08/18 0938  . guaiFENesin (ROBITUSSIN) 100 MG/5ML solution 400 mg  400 mg Oral TID PRN Henreitta Leber, MD      . ipratropium-albuterol (DUONEB) 0.5-2.5 (3) MG/3ML nebulizer solution 3 mL  3 mL Nebulization Q6H PRN Sainani, Belia Heman, MD       . ketorolac (ACULAR) 0.5 % ophthalmic solution 1 drop  1 drop Left Eye TID Henreitta Leber, MD   1 drop at 03/08/18 0939  . multivitamin with minerals tablet 1 tablet  1 tablet Oral Daily Vaughan Basta, MD      . multivitamin-lutein (OCUVITE-LUTEIN) capsule 1 capsule  1 capsule Oral Daily Vaughan Basta, MD      . ondansetron (ZOFRAN) tablet 4 mg  4 mg Oral Q6H PRN Henreitta Leber, MD       Or  . ondansetron (ZOFRAN) injection 4 mg  4 mg Intravenous Q6H PRN Henreitta Leber, MD      . pantoprazole (PROTONIX) EC tablet 40 mg  40 mg Oral BID AC Henreitta Leber, MD   40 mg at 03/08/18 0938  . senna-docusate (Senokot-S) tablet 1 tablet  1 tablet Oral BID PRN Henreitta Leber, MD      . traMADol Veatrice Bourbon) tablet 50 mg  50 mg Oral Q6H PRN Henreitta Leber, MD         Discharge Medications: Please see discharge summary for a list of discharge medications.  Relevant Imaging Results:  Relevant Lab Results:   Additional Information (SSN: [id number])  Brynden Thune, Veronia Beets, LCSW

## 2018-03-08 NOTE — Clinical Social Work Note (Signed)
Clinical Social Work Assessment  Patient Details  Name: Dawn [name] MRN: [id number] Date of Birth: January 18, 1928  Date of referral:  03/08/18               Reason for consult:  Other (Comment Required)(From [facility])                Permission sought to share information with:  Chartered certified accountant granted to share information::  Yes, Verbal Permission Granted  Name::      [facility]   Agency::     Relationship::     Contact Information:     Housing/Transportation Living arrangements for the past 2 months:  [place] of Information:  Patient, Adult Children, Facility Patient Interpreter Needed:  None Criminal Activity/Legal Involvement Pertinent to Current Situation/Hospitalization:  No - Comment as needed Significant Relationships:  Adult Children Lives with:  Facility Resident Do you feel safe going back to the place where you live?  Yes Need for family participation in patient care:  Yes (Comment)  Care giving concerns:  Patient is a short term rehab resident at [facility].    Social Worker assessment / plan:  Holiday representative ([place]) reviewed chart and noted that patient is from [facility]. Per MD patient is stable for D/C back to [facility] today. Per Neoma Laming admissions coordinator at [facility] patient is a short term rehab resident and can return to [facility] today to room 318. RN will call report to [facility] and arrange EMS for transport. CSW sent D/C orders to [facility] via [place].   CSW met with patient and made her aware of above. Patient's son Ronalee Belts was at bedside and is agreeable to the plan. Patient has been at [facility] since 01/14/18 per son. Please reconsult if future social work needs arise. CSW signing off.   Employment status:  Retired Forensic scientist:  Medicare PT Recommendations:  Not assessed at this time [place] / Referral to community resources:  [place]  Patient/Family's Response to care:  Patient and her son are agreeable for patient to return to [place].   Patient/Family's Understanding of and Emotional Response to Diagnosis, Current Treatment, and Prognosis:  Patient and her son were very pleasant and thanked CSW for assistance.   Emotional Assessment Appearance:  Appears stated age Attitude/Demeanor/Rapport:    Affect (typically observed):  Accepting, Adaptable, Pleasant Orientation:  Oriented to Self, Oriented to Place, Oriented to  Time, Oriented to Situation Alcohol / Substance use:  Not Applicable Psych involvement (Current and /or in the community):  No (Comment)  Discharge Needs  Concerns to be addressed:  Discharge Planning Concerns Readmission within the last 30 days:  No Current discharge risk:  Dependent with Mobility Barriers to Discharge:  No Barriers Identified   Walter Min, Veronia Beets, LCSW 03/08/2018, 4:41 PM

## 2018-03-08 NOTE — Discharge Summary (Signed)
[facility] [place] NAME: Dawn [name]    MR#:  [id number]  DATE OF BIRTH:  [birth year]  DATE OF ADMISSION:  03/07/2018 ADMITTING PHYSICIAN: Henreitta Leber, MD  DATE OF DISCHARGE: 03/08/2018   PRIMARY CARE PHYSICIAN: Idelle Crouch, MD    ADMISSION DIAGNOSIS:  Generalized weakness [R53.1] Symptomatic anemia [D64.9] Orthostatic dizziness [R42]  DISCHARGE DIAGNOSIS:  Active Problems:   Symptomatic anemia   SECONDARY DIAGNOSIS:   Past Medical History:  Diagnosis Date  . Cancer ([place])    skin cancer  . 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 Hypertension   . Sciatica     HOSPITAL COURSE:   84 year old female known history of hypertension, hyperlipidemia, history of recent hospitalization for Norovirus, UTI, peripheral vascular disease, severe protein calorie malnutrition, history of COPD, stage II decubitus ulcer, essential hypertension who presents to the hospital from a skilled nursing facility due to generalized weakness and lethargy and noted to be significantly anemic.  1.  Symptomatic anemia-this is the cause of patient's profound weakness and lethargy.  The source of her anemia is the significant blood loss from her recent traumatic event about 10-11 days ago.  Patient had a fall with a bleeding laceration to the head. - Patient's rectal exam was negative in the ER, she has had no further evidence of bleeding.  checked iron studies, B12, folate. - received blood transfusion, hemoglobin came up and stable. - We'll continue aspirin and Plavix on discharge as no active bleeding. - Discharged on oral iron and follow up with primary care physician  2.  Essential hypertension-continue clonidine, Norvasc.  3.  Hyperlipidemia-continue atorvastatin  4.  GERD-continue Protonix.    5.  History of COPD-no acute exacerbation.  Continue as needed duo  nebs.    DISCHARGE CONDITIONS:   Stable.  CONSULTS OBTAINED:    DRUG ALLERGIES:   Allergies  Allergen Reactions  . Percocet [Oxycodone-Acetaminophen] Itching  . Percodan [Oxycodone-Aspirin] Itching    DISCHARGE MEDICATIONS:   Allergies as of 03/08/2018      Reactions   Percocet [oxycodone-acetaminophen] Itching   Percodan [oxycodone-aspirin] Itching      Medication List    STOP taking these medications   levofloxacin 500 MG tablet Commonly known as:  LEVAQUIN     TAKE these medications   acetaminophen 325 MG tablet Commonly known as:  TYLENOL Take 2 tablets (650 mg total) by mouth every 6 (six) hours as needed for mild pain (or Fever >/= 101).   acidophilus Caps capsule Take 1 capsule by mouth daily.   amLODipine 10 MG tablet Commonly known as:  NORVASC Take 1 tablet (10 mg total) by mouth daily.   aspirin EC 81 MG tablet Take 81 mg by mouth daily.   atorvastatin 20 MG tablet Commonly known as:  LIPITOR TAKE 1 TABLET BY MOUTH ONCE DAILY AT  6PM   B-12 5000 MCG Caps Take 5,000 mcg by mouth daily.   cloNIDine 0.1 MG tablet Commonly known as:  CATAPRES Take 1 tablet (0.1 mg total) by mouth 2 (two) times daily.   clopidogrel 75 MG tablet Commonly known as:  PLAVIX Take 1 tablet (75 mg total) by mouth daily.   CYSTEX Liqd Take 15 mLs by mouth daily.   ferrous sulfate 325 (65 FE) MG tablet Take 1 tablet (325 mg total) by mouth 2 (two) times daily with a meal.   guaifenesin  100 MG/5ML syrup Commonly known as:  ROBITUSSIN Take 400 mg by mouth 3 (three) times daily as needed for cough.   ipratropium-albuterol 0.5-2.5 (3) MG/3ML Soln Commonly known as:  DUONEB Take 3 mLs by nebulization every 6 (six) hours as needed. What changed:  additional instructions   ketorolac 0.4 % Soln Commonly known as:  ACULAR Place 1 drop into the left eye 3 (three) times daily.   LEG-GESIC PO Take 0.5 tablets by mouth at bedtime as needed. For restless leg syndrome  (legatrin pm)   ondansetron 4 MG disintegrating tablet Commonly known as:  ZOFRAN-ODT Take 4 mg by mouth every 6 (six) hours as needed for nausea.   pantoprazole 40 MG tablet Commonly known as:  PROTONIX Take 1 tablet (40 mg total) by mouth 2 (two) times daily before a meal.   senna-docusate 8.6-50 MG tablet Commonly known as:  Senokot-S Take 1 tablet by mouth 2 (two) times daily as needed for mild constipation.   traMADol 50 MG tablet Commonly known as:  ULTRAM Take 1 tablet (50 mg total) by mouth every 6 (six) hours as needed. What changed:  reasons to take this   Vitamin D3 400 units Caps Take 400 Units by mouth daily.        DISCHARGE INSTRUCTIONS:    Follow-up with PMD in 1 week.  If you experience worsening of your admission symptoms, develop shortness of breath, life threatening emergency, suicidal or homicidal thoughts you must seek medical attention immediately by calling 911 or calling your MD immediately  if symptoms less severe.  You Must read complete instructions/literature along with all the possible adverse reactions/side effects for all the Medicines you take and that have been prescribed to you. Take any new Medicines after you have completely understood and accept all the possible adverse reactions/side effects.   Please note  You were cared for by a hospitalist during your hospital stay. If you have any questions about your discharge medications or the care you received while you were in the hospital after you are discharged, you can call the unit and asked to speak with the hospitalist on call if the hospitalist that took care of you is not available. Once you are discharged, your primary care physician will handle any further medical issues. Please note that NO REFILLS for any discharge medications will be authorized once you are discharged, as it is imperative that you return to your primary care physician (or establish a relationship with a primary care  physician if you do not have one) for your aftercare needs so that they can reassess your need for medications and monitor your lab values.    Today   CHIEF COMPLAINT:   Chief Complaint  Patient presents with  . Abnormal Lab    HISTORY OF PRESENT ILLNESS:  Dawn [name]  is a 84 y.o. female with a known history of hypertension, hyperlipidemia, history of recent hospitalization for Norovirus, UTI, peripheral vascular disease, severe protein calorie malnutrition, history of COPD, stage II decubitus ulcer, essential hypertension who presents to the hospital from a skilled nursing facility due to generalized weakness and lethargy and noted to be significantly anemic.  Patient was recently in the hospital about 10-11 days ago after a fall noted to have a bleeding laceration to her head.  Patient lost a significant amount of blood but had her laceration sutured and she was discharged back home.  She came back to the emergency room where she continued to bleed and the dressing  was readdressed and patient then was discharged to a skilled nursing facility in the next day.  Patient's hemoglobin on March 30 was 12.2.  At the skilled nursing facility she has been feeling increasingly weak and therefore sent to the ER and today hemoglobin is 7.4 and therefore hospitalist services were contacted for treatment evaluation.  Rectal exam done by the ER physician was Hemoccult negative.  As per the patient's son she has not had any rectal bleeding or hematuria or any other blood loss other than her fall 10 days ago when she had a bleeding wound in the head.   VITAL SIGNS:  Blood pressure (!) 147/73, pulse 84, temperature 98 F (36.7 C), temperature source Oral, resp. rate 20, height 5' 2.5" (1.588 m), weight 44.8 kg (98 lb 12.8 oz), SpO2 96 %.  I/O:    Intake/Output Summary (Last 24 hours) at 03/08/2018 1509 Last data filed at 03/08/2018 1350 Gross per 24 hour  Intake 896 ml  Output -  Net 896 ml     PHYSICAL EXAMINATION:   GENERAL:  84 y.o.-year-old patient lying in the bed in no acute distress.  EYES: Pupils equal, round, reactive to light and accommodation. No scleral icterus. Extraocular muscles intact.  Pale conjunctiva HEENT: Head atraumatic, normocephalic. Oropharynx and nasopharynx clear. No oropharyngeal erythema, moist oral mucosa  NECK:  Supple, no jugular venous distention. No thyroid enlargement, no tenderness.  LUNGS: Normal breath sounds bilaterally, no wheezing, rales, rhonchi. No use of accessory muscles of respiration.  CARDIOVASCULAR: S1, S2 RRR. No murmurs, rubs, gallops, clicks.  ABDOMEN: Soft, nontender, nondistended. Bowel sounds present. No organomegaly or mass.  EXTREMITIES: + 1-2 pitting edema bilaterally., cyanosis, or clubbing. + 2 pedal & radial pulses b/l.  Chronic venous stasis with signs of chronic venous stasis bilaterally. NEUROLOGIC: Cranial nerves II through XII are intact. No focal Motor or sensory deficits appreciated b/l.  Globally weak  pSYCHIATRIC: The patient is alert and oriented x 3.  SKIN: No obvious rash, lesion, or ulcer.     DATA REVIEW:   CBC Recent Labs  Lab 03/08/18 0508  WBC 5.7  HGB 8.8*  HCT 26.3*  PLT 371    Chemistries  Recent Labs  Lab 03/08/18 0508  NA 137  K 3.6  CL 105  CO2 27  GLUCOSE 84  BUN 24*  CREATININE 0.84  CALCIUM 8.2*    Cardiac Enzymes No results for input(s): TROPONINI in the last 168 hours.  Microbiology Results  Results for orders placed or performed during the hospital encounter of 03/07/18  Urine culture     Status: None   Collection Time: 03/07/18  3:40 PM  Result Value Ref Range Status   Specimen Description   Final    URINE, RANDOM Performed at [facility], [address], [place], [place] [address]    Special Requests   Final    NONE Performed at [facility], [address] [place], [place] [address]    Culture   Final    NO GROWTH Performed at  [facility], [place] [address], [place], [place] [address]    Report Status 03/08/2018 FINAL  Final  MRSA PCR Screening     Status: None   Collection Time: 03/07/18  9:22 PM  Result Value Ref Range Status   MRSA by PCR NEGATIVE NEGATIVE Final    Comment:        The GeneXpert MRSA Assay (FDA approved for NASAL specimens only), is one component  of a comprehensive MRSA colonization surveillance program. It is not intended to diagnose MRSA infection nor to guide or monitor treatment for MRSA infections. Performed at [facility], [address] [place], [place] [address]     RADIOLOGY:  Dg Chest Portable 1 View  Result Date: 03/07/2018 CLINICAL DATA:  Low hemoglobin and hypertension EXAM: PORTABLE CHEST 1 VIEW COMPARISON:  02/23/2018 FINDINGS: Cardiac shadow is stable. The lungs are again hyperinflated. No focal infiltrate or sizable effusion is seen. No acute bony abnormality is noted. IMPRESSION: No active disease. Electronically Signed   By: Inez Catalina M.D.   On: 03/07/2018 16:34    EKG:   Orders placed or performed during the hospital encounter of 03/07/18  . ED EKG  . ED EKG      Management plans discussed with the patient, family and they are in agreement.  CODE STATUS:     Code Status Orders  (From admission, onward)        Start     Ordered   03/07/18 1856  Full code  Continuous     03/07/18 1856    Code Status History    Date Active Date Inactive Code Status Order ID Comments User Context   01/22/2018 1949 01/23/2018 2253 Full Code [id number]  Saundra Shelling, MD Inpatient   01/03/2018 1521 01/14/2018 1954 Full Code [id number]  Gorden Harms, MD Inpatient   11/09/2017 1131 11/22/2017 1913 Full Code [id number]  Sela Hua, PA-C Inpatient   11/09/2017 1019 11/09/2017 1122 Full Code [id number]  Sela Hua, PA-C Outpatient   11/09/2017 1019 11/09/2017 1019 Full Code [id number]  Algernon Huxley, MD Outpatient   07/12/2017 1005  07/12/2017 1656 Full Code [id number]  Algernon Huxley, MD Inpatient   06/21/2017 1125 06/21/2017 1622 Full Code [id number]  Algernon Huxley, MD Inpatient   04/18/2017 1150 04/18/2017 1302 Full Code [id number]  Algernon Huxley, MD Inpatient   04/18/2017 1104 04/18/2017 1150 Full Code [id number]  Algernon Huxley, MD Inpatient    Advance Directive Documentation     Most Recent Value  Type of Advance Directive  Healthcare Power of Attorney, Living will  Pre-existing out of facility DNR order (yellow form or pink MOST form)  -  "MOST" Form in Place?  -      TOTAL TIME TAKING CARE OF THIS PATIENT: 35 minutes.    Vaughan Basta M.D on 03/08/2018 at 3:09 PM  Between 7am to 6pm - Pager - [phone]  After 6pm go to [email or web address] - password [email or web address] [place] [facility]  Office  [phone]  CC: Primary care physician; Idelle Crouch, MD   Note: This dictation was prepared with Dragon dictation along with smaller phrase technology. Any transcriptional errors that result from this process are unintentional.

## 2018-03-08 NOTE — Care Management Obs Status (Signed)
[place] NOTIFICATION   Patient Details  Name: Dawn [name] MRN: [id number] Date of Birth: 1928/09/15   Medicare Observation Status Notification Given:  Yes. Signed by son    Shelbie Ammons, RN 03/08/2018, 11:22 AM

## 2018-03-09 LAB — TYPE AND SCREEN
ABO/RH(D): A POS
Antibody Screen: NEGATIVE
Unit division: 0

## 2018-03-09 LAB — BPAM RBC
Blood Product Expiration Date: 201904302359
ISSUE DATE / TIME: 201904120013
Unit Type and Rh: 6200

## 2018-03-26 ENCOUNTER — Other Ambulatory Visit: Payer: Self-pay | Admitting: Gastroenterology

## 2018-03-26 DIAGNOSIS — R195 Other fecal abnormalities: Secondary | ICD-10-CM

## 2018-03-26 DIAGNOSIS — K921 Melena: Secondary | ICD-10-CM

## 2018-03-26 DIAGNOSIS — D509 Iron deficiency anemia, unspecified: Secondary | ICD-10-CM

## 2018-03-29 ENCOUNTER — Ambulatory Visit: Payer: Medicare Other | Attending: Gastroenterology

## 2018-04-02 ENCOUNTER — Ambulatory Visit: Admission: RE | Admit: 2018-04-02 | Payer: Medicare Other | Source: Ambulatory Visit

## 2018-06-04 ENCOUNTER — Other Ambulatory Visit (INDEPENDENT_AMBULATORY_CARE_PROVIDER_SITE_OTHER): Payer: Medicare Other

## 2018-06-04 ENCOUNTER — Ambulatory Visit (INDEPENDENT_AMBULATORY_CARE_PROVIDER_SITE_OTHER): Payer: Medicare Other | Admitting: Vascular Surgery

## 2018-06-04 ENCOUNTER — Other Ambulatory Visit (INDEPENDENT_AMBULATORY_CARE_PROVIDER_SITE_OTHER): Payer: Self-pay | Admitting: Vascular Surgery

## 2018-06-04 ENCOUNTER — Encounter (INDEPENDENT_AMBULATORY_CARE_PROVIDER_SITE_OTHER): Payer: Self-pay | Admitting: Vascular Surgery

## 2018-06-04 VITALS — BP 164/87 | HR 83 | Resp 13 | Ht 67.0 in | Wt 98.0 lb

## 2018-06-04 DIAGNOSIS — I701 Atherosclerosis of renal artery: Secondary | ICD-10-CM

## 2018-06-04 DIAGNOSIS — I739 Peripheral vascular disease, unspecified: Secondary | ICD-10-CM

## 2018-06-04 DIAGNOSIS — I10 Essential (primary) hypertension: Secondary | ICD-10-CM

## 2018-06-04 DIAGNOSIS — Z95820 Peripheral vascular angioplasty status with implants and grafts: Secondary | ICD-10-CM | POA: Diagnosis not present

## 2018-06-04 DIAGNOSIS — I639 Cerebral infarction, unspecified: Secondary | ICD-10-CM

## 2018-06-04 DIAGNOSIS — I6523 Occlusion and stenosis of bilateral carotid arteries: Secondary | ICD-10-CM | POA: Diagnosis not present

## 2018-06-04 NOTE — Progress Notes (Signed)
MRN : [id number]  Dawn [name] is a 84 y.o. (February 22, 1928) female who presents with chief complaint of  Chief Complaint  Patient presents with  . Follow-up    3 weeks [facility]I  .  History of Present Illness: Patient returns today in follow up of PAD.  She remains quite weak and deconditioned following multiple issues including UTI and norovirus infection.  Her legs are still weak, but she is not having the pain or discoloration she once had.  Is undergone extensive surgery for revascularization. Her ABIs today are 0.90 on the right and 0.94 on the left.  The right ABI may be falsely elevated from medial calcification as her waveforms on that side or more dampened than the ones on the left, but overall her flow appears to be pretty good.  Current Outpatient Medications  Medication Sig Dispense Refill  . acetaminophen (TYLENOL) 325 MG tablet Take 2 tablets (650 mg total) by mouth every 6 (six) hours as needed for mild pain (or Fever >/= 101).    Marland Kitchen acidophilus (RISAQUAD) CAPS capsule Take 1 capsule by mouth daily. 30 capsule 3  . amLODipine (NORVASC) 10 MG tablet Take 1 tablet (10 mg total) by mouth daily. 30 tablet 3  . aspirin EC 81 MG tablet Take 81 mg by mouth daily.    Marland Kitchen atorvastatin (LIPITOR) 20 MG tablet TAKE 1 TABLET BY MOUTH ONCE DAILY AT  6PM 30 tablet 3  . Cholecalciferol (VITAMIN D3) 400 units CAPS Take 400 Units by mouth daily.    . cloNIDine (CATAPRES) 0.1 MG tablet Take 1 tablet (0.1 mg total) by mouth 2 (two) times daily. (Patient taking differently: Take 0.1 mg by mouth 2 (two) times daily. ) 60 tablet 0  . clopidogrel (PLAVIX) 75 MG tablet Take 1 tablet (75 mg total) by mouth daily.    . Cyanocobalamin (B-12) 5000 MCG CAPS Take 5,000 mcg by mouth daily.     . ferrous sulfate 325 (65 FE) MG tablet Take 1 tablet (325 mg total) by mouth 2 (two) times daily with a meal. 60 tablet 3  . furosemide (LASIX) 20 MG tablet TAKE 1 TABLET BY MOUTH ONCE DAILY AS NEEDED FOR EDEMA   5  . guaifenesin (ROBITUSSIN) 100 MG/5ML syrup Take 400 mg by mouth 3 (three) times daily as needed for cough.    Marland Kitchen ipratropium-albuterol (DUONEB) 0.5-2.5 (3) MG/3ML SOLN Take 3 mLs by nebulization every 6 (six) hours as needed. (Patient taking differently: Take 3 mLs by nebulization every 6 (six) hours as needed. For shortness of breath) 360 mL   . ketorolac (ACULAR) 0.4 % SOLN Place 1 drop into the left eye 3 (three) times daily.     . metoprolol succinate (TOPROL-XL) 25 MG 24 hr tablet Take by mouth.    . Misc Natural Products (CYSTEX) LIQD Take 15 mLs by mouth daily.    . Multiple Minerals-Vitamins (LEG-GESIC PO) Take 0.5 tablets by mouth at bedtime as needed. For restless leg syndrome (legatrin pm)    . ondansetron (ZOFRAN-ODT) 4 MG disintegrating tablet Take 4 mg by mouth every 6 (six) hours as needed for nausea.    . pantoprazole (PROTONIX) 40 MG tablet Take 1 tablet (40 mg total) by mouth 2 (two) times daily before a meal.    . senna-docusate (SENOKOT-S) 8.6-50 MG tablet Take 1 tablet by mouth 2 (two) times daily as needed for mild constipation. 60 tablet 0  . traMADol (ULTRAM) 50 MG tablet Take 1 tablet (50 mg  total) by mouth every 6 (six) hours as needed. (Patient taking differently: Take 50 mg by mouth every 6 (six) hours as needed for moderate pain. ) 20 tablet 0   No current facility-administered medications for this visit.     Past Medical History:  Diagnosis Date  . Cancer ([place])    skin cancer  . 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 Hypertension   . Sciatica     Past Surgical History:  Procedure Laterality Date  . ABDOMINAL HYSTERECTOMY    . APPENDECTOMY    . CATARACT EXTRACTION, BILATERAL    . CHOLECYSTECTOMY    . ENDARTERECTOMY FEMORAL Right 04/19/2017   Procedure: ENDARTERECTOMY COMMON FEMORAL ARTERY, PROFUNDA  FEMORIS ARTERY, and  SUPERFICIAL FEMORAL ARTERY;  Surgeon: Algernon Huxley, MD;  Location: [facility];   Service: Vascular;  Laterality: Right;  . ENDARTERECTOMY FEMORAL Left 11/14/2017   Procedure: ENDARTERECTOMY FEMORAL;  Surgeon: Algernon Huxley, MD;  Location: [facility];  Service: Vascular;  Laterality: Left;  . HIP SURGERY Bilateral   . LOWER EXTREMITY ANGIOGRAM Right 04/19/2017   Procedure: LOWER EXTREMITY ANGIOGRAM WITH SUPERFICIAL FEMORAL ARTERY STENT PLACEMENT;  Surgeon: Algernon Huxley, MD;  Location: [facility];  Service: Vascular;  Laterality: Right;  . LOWER EXTREMITY ANGIOGRAPHY Right 04/18/2017   Procedure: Lower Extremity Angiography;  Surgeon: Algernon Huxley, MD;  Location: [place] [facility];  Service: Cardiovascular;  Laterality: Right;  . LOWER EXTREMITY ANGIOGRAPHY Left 07/12/2017   Procedure: Lower Extremity Angiography;  Surgeon: Algernon Huxley, MD;  Location: [place] [facility];  Service: Cardiovascular;  Laterality: Left;  . LOWER EXTREMITY ANGIOGRAPHY Left 11/09/2017   Procedure: LOWER EXTREMITY ANGIOGRAPHY;  Surgeon: Algernon Huxley, MD;  Location: [place] [facility];  Service: Cardiovascular;  Laterality: Left;  . LOWER EXTREMITY INTERVENTION  07/12/2017   Procedure: Lower Extremity Intervention;  Surgeon: Algernon Huxley, MD;  Location: [place] [facility];  Service: Cardiovascular;;  . LOWER EXTREMITY INTERVENTION  11/09/2017   Procedure: LOWER EXTREMITY INTERVENTION;  Surgeon: Algernon Huxley, MD;  Location: [place] [facility];  Service: Cardiovascular;;  . RENAL ANGIOGRAPHY N/A 06/21/2017   Procedure: Renal Angiography;  Surgeon: Algernon Huxley, MD;  Location: [place] [facility];  Service: Cardiovascular;  Laterality: N/A;  . SHOULDER SURGERY Bilateral   . TOTAL HIP ARTHROPLASTY Bilateral     Social History        Tobacco Use  . Smoking status: Former Smoker    Types: Cigarettes  . Smokeless tobacco: Never Used  Substance Use Topics  . Alcohol use: No  . Drug use: No    Family History      Family History  Problem Relation Age of Onset  . Diabetes Father   .  Heart disease Father   NO bleeding disorders, clotting disorders, or autoimmune disease      Allergies  Allergen Reactions  . Percocet [Oxycodone-Acetaminophen] Itching  . Percodan [Oxycodone-Aspirin] Itching     REVIEW OF SYSTEMS(Negative unless checked)  Constitutional: [] Weight loss[] Fever[] Chills Cardiac:[] Chest pain[] Chest pressure[] Palpitations [] Shortness of breath when laying flat [] Shortness of breath at rest [] Shortness of breath with exertion. Vascular: [] Pain in legs with walking[] Pain in legsat rest[] Pain in legs when laying flat [x] Claudication [x] Pain in feet when walking [x] Pain in feet at rest [] Pain in feet when laying flat [] History of DVT [] Phlebitis [] Swelling in legs [] Varicose veins [] Non-healing ulcers Pulmonary: [] Uses home oxygen [] Productive cough[] Hemoptysis [] Wheeze [] COPD []   Asthma Neurologic: [] Dizziness [] Blackouts [] Seizures [] History of stroke [] History of TIA[] Aphasia [] Temporary blindness[] Dysphagia [] Weaknessor numbness in arms [] Weakness or numbnessin legs Musculoskeletal: [x] Arthritis [] Joint swelling [] Joint pain [x] Low back pain Hematologic:[] Easy bruising[] Easy bleeding [] Hypercoagulable state [] Anemic [] Hepatitis Gastrointestinal:[] Blood in stool[] Vomiting blood[] Gastroesophageal reflux/heartburn[] Difficulty swallowing. Genitourinary: [x] Chronic kidney disease [] Difficulturination [] Frequenturination [] Burning with urination[] Blood in urine Skin: [] Rashes [] Ulcers [] Wounds Psychological: [] History of anxiety[] History of major depression.   Physical Examination  BP (!) 164/87 (BP Location: Left Arm, Patient Position: Sitting)   Pulse 83   Resp 13   Ht 5' 7"  (1.702 m)   Wt 98 lb (44.5 kg)   BMI 15.35 kg/m  Gen: Thin and frail appearing, NAD.  Appears younger than stated age Head: [place]/AT, + temporalis  wasting. Ear/Nose/Throat: Hearing grossly intact, nares w/o erythema or drainage Eyes: Conjunctiva clear. Sclera non-icteric Neck: Supple.  Trachea midline Pulmonary:  Good air movement, no use of accessory muscles.  Cardiac: RRR, no JVD Vascular:  Vessel Right Left  Radial Palpable Palpable                          PT  1+ palpable  1+ palpable  DP  1+ palpable  2+ palpable    Musculoskeletal: M/S 5/5 throughout.  No deformity or atrophy.  Moderate stasis dermatitis changes.  No edema.  Using a wheelchair Neurologic: Sensation grossly intact in extremities.  Symmetrical.  Speech is fluent.  Psychiatric: Judgment intact, Mood & affect appropriate for pt's clinical situation. Dermatologic: No rashes or ulcers noted.  No cellulitis or open wounds.       Labs Recent Results (from the past 2160 hour(s))  CBC with Differential     Status: Abnormal   Collection Time: 03/07/18  3:02 PM  Result Value Ref Range   WBC 7.4 3.6 - 11.0 K/uL   RBC 2.68 (L) 3.80 - 5.20 MIL/uL   Hemoglobin 7.6 (L) 12.0 - 16.0 g/dL   HCT 23.0 (L) 35.0 - 47.0 %   MCV 85.9 80.0 - 100.0 fL   MCH 28.2 26.0 - 34.0 pg   MCHC 32.9 32.0 - 36.0 g/dL   RDW 19.8 (H) 11.5 - 14.5 %   Platelets 449 (H) 150 - 440 K/uL   Neutrophils Relative % 76 %   Neutro Abs 5.6 1.4 - 6.5 K/uL   Lymphocytes Relative 15 %   Lymphs Abs 1.1 1.0 - 3.6 K/uL   Monocytes Relative 7 %   Monocytes Absolute 0.5 0.2 - 0.9 K/uL   Eosinophils Relative 1 %   Eosinophils Absolute 0.1 0 - 0.7 K/uL   Basophils Relative 1 %   Basophils Absolute 0.1 0 - 0.1 K/uL    Comment: Performed at [facility], [address] [place],  [address]  Basic metabolic panel     Status: Abnormal   Collection Time: 03/07/18  3:02 PM  Result Value Ref Range   Sodium 136 135 - 145 mmol/L   Potassium 3.9 3.5 - 5.1 mmol/L   Chloride 100 (L) 101 - 111 mmol/L   CO2 27 22 - 32 mmol/L   Glucose, Bld 114 (H) 65 - 99 mg/dL   BUN 33 (H) 6 - 20 mg/dL    Creatinine, Ser 1.00 0.44 - 1.00 mg/dL   Calcium 8.5 (L) 8.9 - 10.3 mg/dL   GFR calc non Af Amer 49 (L) >60 mL/min   GFR calc Af Amer 57 (L) >60 mL/min    Comment: (NOTE) The eGFR has been calculated  using the CKD EPI equation. This calculation has not been validated in all clinical situations. eGFR's persistently <60 mL/min signify possible Chronic Kidney Disease.    Anion gap 9 5 - 15    Comment: Performed at [facility], [address] [place], [place] [address]  Urinalysis, Complete w Microscopic     Status: Abnormal   Collection Time: 03/07/18  3:40 PM  Result Value Ref Range   Color, Urine YELLOW (A) YELLOW   APPearance CLEAR (A) CLEAR   Specific Gravity, Urine 1.014 1.005 - 1.030   pH 6.0 5.0 - 8.0   Glucose, UA NEGATIVE NEGATIVE mg/dL   Hgb urine dipstick NEGATIVE NEGATIVE   Bilirubin Urine NEGATIVE NEGATIVE   Ketones, ur NEGATIVE NEGATIVE mg/dL   Protein, ur NEGATIVE NEGATIVE mg/dL   Nitrite NEGATIVE NEGATIVE   Leukocytes, UA NEGATIVE NEGATIVE   RBC / HPF 0-5 0 - 5 RBC/hpf   WBC, UA 0-5 0 - 5 WBC/hpf   Bacteria, UA NONE SEEN NONE SEEN   Squamous Epithelial / LPF 0-5 (A) NONE SEEN   Mucus PRESENT     Comment: Performed at [facility], [address], [place], [place] [address]  Urine culture     Status: None   Collection Time: 03/07/18  3:40 PM  Result Value Ref Range   Specimen Description      URINE, RANDOM Performed at [facility], [address] [place], [place] [address]    Special Requests      NONE Performed at [facility], [address] [place], [place] [address]    Culture      NO GROWTH Performed at [facility], [place] [address], [place], [place] [address]    Report Status 03/08/2018 FINAL   Type and screen     Status: None   Collection Time: 03/07/18  4:12 PM  Result Value Ref Range   ABO/RH(D) A POS    Antibody Screen NEG    Sample Expiration 03/10/2018    Unit Number [id number]     Blood Component Type RED CELLS,LR    Unit division 00    Status of Unit ISSUED,FINAL    Transfusion Status OK TO TRANSFUSE    Crossmatch Result      Compatible Performed at [facility], [address] [place], [place] [address]   BPAM RBC     Status: None   Collection Time: 03/07/18  4:12 PM  Result Value Ref Range   ISSUE DATE / TIME [id number]    Blood Product Unit Number [id number]    PRODUCT CODE [id number]    Unit Type and Rh 6200    Blood Product Expiration Date [id number]   Prepare RBC     Status: None   Collection Time: 03/07/18  4:23 PM  Result Value Ref Range   Order Confirmation      ORDER PROCESSED BY BLOOD BANK Performed at [facility], [address] [place], [place] [address]   Vitamin B12     Status: None   Collection Time: 03/07/18  7:38 PM  Result Value Ref Range   Vitamin B-12 >7,500 180 - 914 pg/mL    Comment: (NOTE) This assay is not validated for testing neonatal or myeloproliferative syndrome specimens for Vitamin B12 levels. Performed at [facility], [place] [address] [place], [place] [address]   Folate     Status: None   Collection Time: 03/07/18  7:38 PM  Result Value Ref  Range   Folate 13.2 >5.9 ng/mL    Comment: Performed at [facility] [address] [place], [place] [address]  Iron and TIBC     Status: Abnormal   Collection Time: 03/07/18  7:38 PM  Result Value Ref Range   Iron 11 (L) 28 - 170 ug/dL   TIBC 206 (L) 250 - 450 ug/dL   Saturation Ratios 5 (L) 10.4 - 31.8 %   UIBC 195 ug/dL    Comment: Performed at [facility], [address] [place], [place] [address]  Ferritin     Status: None   Collection Time: 03/07/18  7:38 PM  Result Value Ref Range   Ferritin 64 11 - 307 ng/mL    Comment: Performed at [facility], [address] [place], [place] [address]  Reticulocytes     Status: Abnormal   Collection Time: 03/07/18  7:38 PM  Result Value Ref Range   Retic Ct  Pct 2.3 0.4 - 3.1 %   RBC. 2.36 (L) 3.80 - 5.20 MIL/uL   Retic Count, Absolute 54.3 19.0 - 183.0 K/uL    Comment: Performed at [facility], [address] [place], [place] [address]  MRSA PCR Screening     Status: None   Collection Time: 03/07/18  9:22 PM  Result Value Ref Range   MRSA by PCR NEGATIVE NEGATIVE    Comment:        The GeneXpert MRSA Assay (FDA approved for NASAL specimens only), is one component of a comprehensive MRSA colonization surveillance program. It is not intended to diagnose MRSA infection nor to guide or monitor treatment for MRSA infections. Performed at [facility], [address] [place], [place] [address]   Basic metabolic panel     Status: Abnormal   Collection Time: 03/08/18  5:08 AM  Result Value Ref Range   Sodium 137 135 - 145 mmol/L   Potassium 3.6 3.5 - 5.1 mmol/L   Chloride 105 101 - 111 mmol/L   CO2 27 22 - 32 mmol/L   Glucose, Bld 84 65 - 99 mg/dL   BUN 24 (H) 6 - 20 mg/dL   Creatinine, Ser 0.84 0.44 - 1.00 mg/dL   Calcium 8.2 (L) 8.9 - 10.3 mg/dL   GFR calc non Af Amer >60 >60 mL/min   GFR calc Af Amer >60 >60 mL/min    Comment: (NOTE) The eGFR has been calculated using the CKD EPI equation. This calculation has not been validated in all clinical situations. eGFR's persistently <60 mL/min signify possible Chronic Kidney Disease.    Anion gap 5 5 - 15    Comment: Performed at [facility] [address] [place], [place] [address]  CBC     Status: Abnormal   Collection Time: 03/08/18  5:08 AM  Result Value Ref Range   WBC 5.7 3.6 - 11.0 K/uL   RBC 3.01 (L) 3.80 - 5.20 MIL/uL   Hemoglobin 8.8 (L) 12.0 - 16.0 g/dL   HCT 26.3 (L) 35.0 - 47.0 %   MCV 87.5 80.0 - 100.0 fL   MCH 29.4 26.0 - 34.0 pg   MCHC 33.6 32.0 - 36.0 g/dL   RDW 18.0 (H) 11.5 - 14.5 %   Platelets 371 150 - 440 K/uL    Comment: Performed at [facility], [address] [place], [place] [address]    Radiology No  results found.  Assessment/Plan Carotid stenosis No new symptoms.  To be checked later  this year.  Hyperlipidemia lipid control important in reducing the progression of atherosclerotic disease. Continue statin therapy  Essential hypertension blood pressure control important in reducing the progression of atherosclerotic disease. On appropriate oral medications.   Renal artery stenosis ([facility]) Has not been checked recently.  Plan to recheck on next visit in 3 months.  Blood pressure has generally been better per the son  PVD (peripheral vascular disease) ([place]) Her ABIs today are 0.90 on the right and 0.94 on the left.  The right ABI may be falsely elevated from medial calcification as her waveforms on that side or more dampened than the ones on the left, but overall her flow appears to be pretty good. No current limb threatening symptoms.  Continue current medical regimen.  Increase activity as tolerated.  Recheck in 3 months    Leotis Pain, MD  06/04/2018 2:37 PM    This note was created with Dragon medical transcription system.  Any errors from dictation are purely unintentional

## 2018-06-04 NOTE — Assessment & Plan Note (Signed)
Her ABIs today are 0.90 on the right and 0.94 on the left.  The right ABI may be falsely elevated from medial calcification as her waveforms on that side or more dampened than the ones on the left, but overall her flow appears to be pretty good. No current limb threatening symptoms.  Continue current medical regimen.  Increase activity as tolerated.  Recheck in 3 months

## 2018-06-04 NOTE — Assessment & Plan Note (Signed)
Has not been checked recently.  Plan to recheck on next visit in 3 months.  Blood pressure has generally been better per the son

## 2018-06-21 ENCOUNTER — Emergency Department
Admission: EM | Admit: 2018-06-21 | Discharge: 2018-06-21 | Payer: Medicare Other | Attending: Emergency Medicine | Admitting: Emergency Medicine

## 2018-06-21 ENCOUNTER — Inpatient Hospital Stay (HOSPITAL_COMMUNITY)
Admission: AD | Admit: 2018-06-21 | Discharge: 2018-07-04 | DRG: 064 | Disposition: A | Discharge status: Skilled Nursing Facility | Payer: Medicare Other | Source: Other Acute Inpatient Hospital | Attending: Neurology | Admitting: Neurology

## 2018-06-21 ENCOUNTER — Emergency Department: Payer: Medicare Other

## 2018-06-21 DIAGNOSIS — I5032 Chronic diastolic (congestive) heart failure: Secondary | ICD-10-CM | POA: Diagnosis present

## 2018-06-21 DIAGNOSIS — E876 Hypokalemia: Secondary | ICD-10-CM | POA: Diagnosis present

## 2018-06-21 DIAGNOSIS — I480 Paroxysmal atrial fibrillation: Secondary | ICD-10-CM | POA: Diagnosis present

## 2018-06-21 DIAGNOSIS — Z85828 Personal history of other malignant neoplasm of skin: Secondary | ICD-10-CM

## 2018-06-21 DIAGNOSIS — Z7189 Other specified counseling: Secondary | ICD-10-CM

## 2018-06-21 DIAGNOSIS — E785 Hyperlipidemia, unspecified: Secondary | ICD-10-CM | POA: Diagnosis present

## 2018-06-21 DIAGNOSIS — E43 Unspecified severe protein-calorie malnutrition: Secondary | ICD-10-CM | POA: Diagnosis not present

## 2018-06-21 DIAGNOSIS — I361 Nonrheumatic tricuspid (valve) insufficiency: Secondary | ICD-10-CM | POA: Diagnosis not present

## 2018-06-21 DIAGNOSIS — I63511 Cerebral infarction due to unspecified occlusion or stenosis of right middle cerebral artery: Secondary | ICD-10-CM | POA: Diagnosis present

## 2018-06-21 DIAGNOSIS — Z8249 Family history of ischemic heart disease and other diseases of the circulatory system: Secondary | ICD-10-CM

## 2018-06-21 DIAGNOSIS — Z66 Do not resuscitate: Secondary | ICD-10-CM | POA: Diagnosis not present

## 2018-06-21 DIAGNOSIS — Z515 Encounter for palliative care: Secondary | ICD-10-CM

## 2018-06-21 DIAGNOSIS — Z79899 Other long term (current) drug therapy: Secondary | ICD-10-CM | POA: Insufficient documentation

## 2018-06-21 DIAGNOSIS — R509 Fever, unspecified: Secondary | ICD-10-CM

## 2018-06-21 DIAGNOSIS — D72829 Elevated white blood cell count, unspecified: Secondary | ICD-10-CM | POA: Diagnosis not present

## 2018-06-21 DIAGNOSIS — I10 Essential (primary) hypertension: Secondary | ICD-10-CM | POA: Insufficient documentation

## 2018-06-21 DIAGNOSIS — Z9282 Status post administration of tPA (rtPA) in a different facility within the last 24 hours prior to admission to current facility: Secondary | ICD-10-CM | POA: Diagnosis not present

## 2018-06-21 DIAGNOSIS — F17210 Nicotine dependence, cigarettes, uncomplicated: Secondary | ICD-10-CM | POA: Insufficient documentation

## 2018-06-21 DIAGNOSIS — Z681 Body mass index (BMI) 19 or less, adult: Secondary | ICD-10-CM | POA: Diagnosis not present

## 2018-06-21 DIAGNOSIS — Z90710 Acquired absence of both cervix and uterus: Secondary | ICD-10-CM | POA: Diagnosis not present

## 2018-06-21 DIAGNOSIS — Z7902 Long term (current) use of antithrombotics/antiplatelets: Secondary | ICD-10-CM

## 2018-06-21 DIAGNOSIS — I110 Hypertensive heart disease with heart failure: Secondary | ICD-10-CM | POA: Diagnosis not present

## 2018-06-21 DIAGNOSIS — Z7982 Long term (current) use of aspirin: Secondary | ICD-10-CM

## 2018-06-21 DIAGNOSIS — I4892 Unspecified atrial flutter: Secondary | ICD-10-CM | POA: Diagnosis not present

## 2018-06-21 DIAGNOSIS — R29810 Facial weakness: Secondary | ICD-10-CM | POA: Diagnosis present

## 2018-06-21 DIAGNOSIS — I639 Cerebral infarction, unspecified: Secondary | ICD-10-CM | POA: Diagnosis not present

## 2018-06-21 DIAGNOSIS — R29718 NIHSS score 18: Secondary | ICD-10-CM | POA: Diagnosis not present

## 2018-06-21 DIAGNOSIS — G8194 Hemiplegia, unspecified affecting left nondominant side: Secondary | ICD-10-CM | POA: Diagnosis present

## 2018-06-21 DIAGNOSIS — K5900 Constipation, unspecified: Secondary | ICD-10-CM | POA: Diagnosis present

## 2018-06-21 DIAGNOSIS — R5081 Fever presenting with conditions classified elsewhere: Secondary | ICD-10-CM

## 2018-06-21 DIAGNOSIS — I63411 Cerebral infarction due to embolism of right middle cerebral artery: Principal | ICD-10-CM | POA: Diagnosis present

## 2018-06-21 DIAGNOSIS — I161 Hypertensive emergency: Secondary | ICD-10-CM | POA: Diagnosis not present

## 2018-06-21 DIAGNOSIS — Z993 Dependence on wheelchair: Secondary | ICD-10-CM | POA: Diagnosis not present

## 2018-06-21 DIAGNOSIS — Z87891 Personal history of nicotine dependence: Secondary | ICD-10-CM

## 2018-06-21 DIAGNOSIS — Z833 Family history of diabetes mellitus: Secondary | ICD-10-CM | POA: Diagnosis not present

## 2018-06-21 DIAGNOSIS — R1312 Dysphagia, oropharyngeal phase: Secondary | ICD-10-CM | POA: Diagnosis present

## 2018-06-21 DIAGNOSIS — R531 Weakness: Secondary | ICD-10-CM | POA: Diagnosis present

## 2018-06-21 DIAGNOSIS — I483 Typical atrial flutter: Secondary | ICD-10-CM | POA: Diagnosis not present

## 2018-06-21 DIAGNOSIS — R6889 Other general symptoms and signs: Secondary | ICD-10-CM | POA: Diagnosis not present

## 2018-06-21 DIAGNOSIS — Z931 Gastrostomy status: Secondary | ICD-10-CM | POA: Diagnosis not present

## 2018-06-21 DIAGNOSIS — N179 Acute kidney failure, unspecified: Secondary | ICD-10-CM | POA: Diagnosis not present

## 2018-06-21 LAB — DIFFERENTIAL
BASOS ABS: 0 10*3/uL (ref 0–0.1)
Basophils Relative: 0 %
Eosinophils Absolute: 0 10*3/uL (ref 0–0.7)
Eosinophils Relative: 0 %
LYMPHS ABS: 1.2 10*3/uL (ref 1.0–3.6)
LYMPHS PCT: 14 %
Monocytes Absolute: 0.2 10*3/uL (ref 0.2–0.9)
Monocytes Relative: 3 %
NEUTROS ABS: 7.2 10*3/uL — AB (ref 1.4–6.5)
NEUTROS PCT: 83 %

## 2018-06-21 LAB — CBC
HEMATOCRIT: 35.6 % (ref 35.0–47.0)
HEMOGLOBIN: 11.8 g/dL — AB (ref 12.0–16.0)
MCH: 28.9 pg (ref 26.0–34.0)
MCHC: 33.3 g/dL (ref 32.0–36.0)
MCV: 87 fL (ref 80.0–100.0)
Platelets: 250 10*3/uL (ref 150–440)
RBC: 4.09 MIL/uL (ref 3.80–5.20)
RDW: 18.2 % — ABNORMAL HIGH (ref 11.5–14.5)
WBC: 8.6 10*3/uL (ref 3.6–11.0)

## 2018-06-21 LAB — COMPREHENSIVE METABOLIC PANEL
ALBUMIN: 3.2 g/dL — AB (ref 3.5–5.0)
ALT: 13 U/L (ref 0–44)
AST: 35 U/L (ref 15–41)
Alkaline Phosphatase: 123 U/L (ref 38–126)
Anion gap: 11 (ref 5–15)
BUN: 31 mg/dL — AB (ref 8–23)
CO2: 28 mmol/L (ref 22–32)
CREATININE: 1.1 mg/dL — AB (ref 0.44–1.00)
Calcium: 9.5 mg/dL (ref 8.9–10.3)
Chloride: 99 mmol/L (ref 98–111)
GFR calc Af Amer: 51 mL/min — ABNORMAL LOW (ref >60)
GFR, EST NON AFRICAN AMERICAN: 44 mL/min — AB (ref >60)
GLUCOSE: 185 mg/dL — AB (ref 70–99)
Potassium: 4.1 mmol/L (ref 3.5–5.1)
Sodium: 138 mmol/L (ref 135–145)
Total Bilirubin: 0.4 mg/dL (ref 0.3–1.2)
Total Protein: 8.1 g/dL (ref 6.5–8.1)

## 2018-06-21 LAB — GLUCOSE, CAPILLARY: Glucose-Capillary: 159 mg/dL — ABNORMAL HIGH (ref 70–99)

## 2018-06-21 LAB — PROTIME-INR
INR: 1.02
Prothrombin Time: 13.3 seconds (ref 11.4–15.2)

## 2018-06-21 LAB — TROPONIN I: TROPONIN I: 0.03 ng/mL — AB (ref <0.03)

## 2018-06-21 LAB — APTT: APTT: 39 s — AB (ref 24–36)

## 2018-06-21 LAB — MRSA PCR SCREENING: MRSA by PCR: NEGATIVE

## 2018-06-21 MED ORDER — ALTEPLASE 100 MG IV SOLR
INTRAVENOUS | Status: AC
  Administered 2018-06-21: 40.1 mg via INTRAVENOUS
  Filled 2018-06-21: qty 100

## 2018-06-21 MED ORDER — SENNOSIDES-DOCUSATE SODIUM 8.6-50 MG PO TABS: 1.0000 | ORAL_TABLET | Freq: Every evening | ORAL | Status: DC | PRN

## 2018-06-21 MED ORDER — LABETALOL HCL 5 MG/ML IV SOLN
10.0000 mg | Freq: Once | INTRAVENOUS | Status: AC
  Administered 2018-06-21: 10 mg via INTRAVENOUS

## 2018-06-21 MED ORDER — CHLORHEXIDINE GLUCONATE 0.12 % MT SOLN
15.0000 mL | Freq: Two times a day (BID) | OROMUCOSAL | Status: DC
  Administered 2018-06-21 – 2018-07-04 (×26): 15 mL via OROMUCOSAL
  Filled 2018-06-21 (×23): qty 15

## 2018-06-21 MED ORDER — FAMOTIDINE IN NACL 20-0.9 MG/50ML-% IV SOLN
20.0000 mg | Freq: Every day | INTRAVENOUS | Status: DC
  Administered 2018-06-21 – 2018-06-23 (×3): 20 mg via INTRAVENOUS
  Filled 2018-06-21 (×3): qty 50

## 2018-06-21 MED ORDER — SODIUM CHLORIDE 0.9 % IV SOLN
50.0000 mL | Freq: Once | INTRAVENOUS | Status: AC
  Administered 2018-06-21: 50 mL via INTRAVENOUS

## 2018-06-21 MED ORDER — ALTEPLASE (STROKE) FULL DOSE INFUSION
0.9000 mg/kg | Freq: Once | INTRAVENOUS | Status: AC
  Administered 2018-06-21: 40.1 mg via INTRAVENOUS
  Filled 2018-06-21: qty 100

## 2018-06-21 MED ORDER — SODIUM CHLORIDE 0.9 % IV SOLN
INTRAVENOUS | Status: DC
  Administered 2018-06-21 – 2018-06-22 (×2): via INTRAVENOUS

## 2018-06-21 MED ORDER — NICARDIPINE HCL IN NACL 20-0.86 MG/200ML-% IV SOLN
3.0000 mg/h | INTRAVENOUS | Status: DC
  Administered 2018-06-21: 5 mg/h via INTRAVENOUS
  Filled 2018-06-21: qty 200

## 2018-06-21 MED ORDER — LABETALOL HCL 5 MG/ML IV SOLN
10.0000 mg | INTRAVENOUS | Status: DC | PRN
  Administered 2018-06-21 – 2018-06-22 (×4): 20 mg via INTRAVENOUS
  Administered 2018-06-22: 10 mg via INTRAVENOUS
  Administered 2018-06-24 (×2): 20 mg via INTRAVENOUS
  Administered 2018-06-24: 40 mg via INTRAVENOUS
  Filled 2018-06-21: qty 4
  Filled 2018-06-21: qty 8
  Filled 2018-06-21 (×4): qty 4
  Filled 2018-06-21: qty 8

## 2018-06-21 MED ORDER — IOPAMIDOL (ISOVUE-370) INJECTION 76%
100.0000 mL | Freq: Once | INTRAVENOUS | Status: AC | PRN
  Administered 2018-06-21: 100 mL via INTRAVENOUS

## 2018-06-21 MED ORDER — LABETALOL HCL 5 MG/ML IV SOLN
INTRAVENOUS | Status: AC
  Administered 2018-06-21: 10 mg via INTRAVENOUS
  Filled 2018-06-21: qty 4

## 2018-06-21 MED ORDER — STROKE: EARLY STAGES OF RECOVERY BOOK
Freq: Once | Status: AC
  Administered 2018-06-21: 16:00:00
  Filled 2018-06-21: qty 1

## 2018-06-21 MED ORDER — ORAL CARE MOUTH RINSE
15.0000 mL | Freq: Two times a day (BID) | OROMUCOSAL | Status: DC
  Administered 2018-06-22 – 2018-07-04 (×20): 15 mL via OROMUCOSAL

## 2018-06-21 MED ORDER — ACETAMINOPHEN 650 MG RE SUPP
650.0000 mg | Freq: Four times a day (QID) | RECTAL | Status: DC | PRN
  Administered 2018-06-21 – 2018-06-22 (×2): 650 mg via RECTAL
  Filled 2018-06-21 (×2): qty 1

## 2018-06-21 NOTE — ED Notes (Signed)
Report called to [facility] and ETA is 63mins. Pt/family updated with plan of care.

## 2018-06-21 NOTE — ED Notes (Signed)
RN Kirke Shaggy at bedside

## 2018-06-21 NOTE — Consult Note (Signed)
Referring Physician: Paduchowski    Chief Complaint: Left sided weakness  HPI: Dawn [name] is an 84 y.o. female with a history of HTN who awakened at baseline today.  Sone left her while she was eating breakfast and when he returned she had dropped the food and was leaning to the left.  EMS was called at that time and patient was brought to the ED for evaluation.  Initial NIHSS of 18.  Initially patient reported that her left side had been "lazy" for days.  Once son arrived he clarified the history and reported that patient has been at baseline.    Date last known well: Date: 06/21/2018 Time last known well: Time: 10:15 tPA Given: Yes  Modified Rankin: Rankin Score=4  Past Medical History:  Diagnosis Date  . Cancer ([place])    skin cancer  . 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 Hypertension   . Sciatica     Past Surgical History:  Procedure Laterality Date  . ABDOMINAL HYSTERECTOMY    . APPENDECTOMY    . CATARACT EXTRACTION, BILATERAL    . CHOLECYSTECTOMY    . ENDARTERECTOMY FEMORAL Right 04/19/2017   Procedure: ENDARTERECTOMY COMMON FEMORAL ARTERY, PROFUNDA  FEMORIS ARTERY, and  SUPERFICIAL FEMORAL ARTERY;  Surgeon: Algernon Huxley, MD;  Location: [facility];  Service: Vascular;  Laterality: Right;  . ENDARTERECTOMY FEMORAL Left 11/14/2017   Procedure: ENDARTERECTOMY FEMORAL;  Surgeon: Algernon Huxley, MD;  Location: [facility];  Service: Vascular;  Laterality: Left;  . HIP SURGERY Bilateral   . LOWER EXTREMITY ANGIOGRAM Right 04/19/2017   Procedure: LOWER EXTREMITY ANGIOGRAM WITH SUPERFICIAL FEMORAL ARTERY STENT PLACEMENT;  Surgeon: Algernon Huxley, MD;  Location: [facility];  Service: Vascular;  Laterality: Right;  . LOWER EXTREMITY ANGIOGRAPHY Right 04/18/2017   Procedure: Lower Extremity Angiography;  Surgeon: Algernon Huxley, MD;  Location: [place] [facility];  Service: Cardiovascular;  Laterality: Right;  . LOWER EXTREMITY  ANGIOGRAPHY Left 07/12/2017   Procedure: Lower Extremity Angiography;  Surgeon: Algernon Huxley, MD;  Location: [place] [facility];  Service: Cardiovascular;  Laterality: Left;  . LOWER EXTREMITY ANGIOGRAPHY Left 11/09/2017   Procedure: LOWER EXTREMITY ANGIOGRAPHY;  Surgeon: Algernon Huxley, MD;  Location: [place] [facility];  Service: Cardiovascular;  Laterality: Left;  . LOWER EXTREMITY INTERVENTION  07/12/2017   Procedure: Lower Extremity Intervention;  Surgeon: Algernon Huxley, MD;  Location: [place] [facility];  Service: Cardiovascular;;  . LOWER EXTREMITY INTERVENTION  11/09/2017   Procedure: LOWER EXTREMITY INTERVENTION;  Surgeon: Algernon Huxley, MD;  Location: [place] [facility];  Service: Cardiovascular;;  . RENAL ANGIOGRAPHY N/A 06/21/2017   Procedure: Renal Angiography;  Surgeon: Algernon Huxley, MD;  Location: [place] [facility];  Service: Cardiovascular;  Laterality: N/A;  . SHOULDER SURGERY Bilateral   . TOTAL HIP ARTHROPLASTY Bilateral     Family History  Problem Relation Age of Onset  . Diabetes Father   . Heart disease Father    Social History:  reports that she quit smoking about 39 years ago. Her smoking use included cigarettes. She quit after 30.00 years of use. She has never used smokeless tobacco. She reports that she does not drink alcohol or use drugs.  Allergies:  Allergies  Allergen Reactions  . Percocet [Oxycodone-Acetaminophen] Itching  . Percodan [Oxycodone-Aspirin] Itching    Medications: I have reviewed the patient's current medications. Prior to Admission:  Prior to  Admission medications   Medication Sig Start Date End Date Taking? Authorizing Provider  aspirin EC 81 MG tablet Take 81 mg by mouth daily.   Yes [provider]  atorvastatin (LIPITOR) 20 MG tablet TAKE 1 TABLET BY MOUTH ONCE DAILY AT  6PM 08/29/17  Yes Dew, Erskine Squibb, MD  Cholecalciferol (VITAMIN D3) 400 units CAPS Take 400 Units by mouth daily.   Yes [provider]  cloNIDine  (CATAPRES) 0.1 MG tablet Take 1 tablet (0.1 mg total) by mouth 2 (two) times daily. Patient taking differently: Take 0.1 mg by mouth 2 (two) times daily.  06/08/16  Yes Daymon Larsen, MD  clopidogrel (PLAVIX) 75 MG tablet Take 1 tablet (75 mg total) by mouth daily. 01/23/18  Yes Sudini, Alveta Heimlich, MD  Cyanocobalamin (B-12) 5000 MCG CAPS Take 5,000 mcg by mouth daily.    Yes [provider]  doxycycline (VIBRA-TABS) 100 MG tablet Take 100 mg by mouth 2 (two) times daily. 06/20/18  Yes [provider]  ferrous sulfate 325 (65 FE) MG tablet Take 1 tablet (325 mg total) by mouth 2 (two) times daily with a meal. 03/08/18  Yes Vaughan Basta, MD  metoprolol succinate (TOPROL-XL) 25 MG 24 hr tablet Take 25 mg by mouth daily.  03/19/18 03/19/19 Yes [provider]  acetaminophen (TYLENOL) 325 MG tablet Take 2 tablets (650 mg total) by mouth every 6 (six) hours as needed for mild pain (or Fever >/= 101). 01/14/18   Loletha Grayer, MD  acidophilus (RISAQUAD) CAPS capsule Take 1 capsule by mouth daily. Patient not taking: Reported on 06/21/2018 11/23/17   Algernon Huxley, MD  amLODipine (NORVASC) 10 MG tablet Take 1 tablet (10 mg total) by mouth daily. Patient not taking: Reported on 06/21/2018 11/23/17   Algernon Huxley, MD  furosemide (LASIX) 20 MG tablet TAKE 1 TABLET BY MOUTH ONCE DAILY AS NEEDED FOR EDEMA 05/20/18   [provider]  ipratropium-albuterol (DUONEB) 0.5-2.5 (3) MG/3ML SOLN Take 3 mLs by nebulization every 6 (six) hours as needed. Patient taking differently: Take 3 mLs by nebulization every 6 (six) hours as needed. For shortness of breath 01/11/18   Demetrios Loll, MD  Multiple Minerals-Vitamins (LEG-GESIC PO) Take 0.5 tablets by mouth at bedtime as needed. For restless leg syndrome (legatrin pm)    [provider]  ondansetron (ZOFRAN-ODT) 4 MG disintegrating tablet Take 4 mg by mouth every 6 (six) hours as needed for nausea.    [provider]   pantoprazole (PROTONIX) 40 MG tablet Take 1 tablet (40 mg total) by mouth 2 (two) times daily before a meal. Patient not taking: Reported on 06/21/2018 01/11/18   Demetrios Loll, MD  predniSONE (DELTASONE) 10 MG tablet Take 10 mg by mouth taper from 4 doses each day to 1 dose and stop.  06/20/18   [provider]  senna-docusate (SENOKOT-S) 8.6-50 MG tablet Take 1 tablet by mouth 2 (two) times daily as needed for mild constipation. Patient not taking: Reported on 06/21/2018 01/14/18   Loletha Grayer, MD  traMADol (ULTRAM) 50 MG tablet Take 1 tablet (50 mg total) by mouth every 6 (six) hours as needed. Patient not taking: Reported on 06/21/2018 01/11/18   Demetrios Loll, MD    ROS: History obtained from the patient  General ROS: negative for - chills, fatigue, fever, night sweats, weight gain or weight loss Psychological ROS: negative for - behavioral disorder, hallucinations, memory difficulties, mood swings or suicidal ideation Ophthalmic ROS: negative for - blurry vision,  double vision, eye pain or loss of vision ENT ROS: negative for - epistaxis, nasal discharge, oral lesions, sore throat, tinnitus or vertigo Allergy and Immunology ROS: negative for - hives or itchy/watery eyes Hematological and Lymphatic ROS: negative for - bleeding problems, bruising or swollen lymph nodes Endocrine ROS: negative for - galactorrhea, hair pattern changes, polydipsia/polyuria or temperature intolerance Respiratory ROS: negative for - cough, hemoptysis, shortness of breath or wheezing Cardiovascular ROS: chest pain Gastrointestinal ROS: negative for - abdominal pain, diarrhea, hematemesis, nausea/vomiting or stool incontinence Genito-Urinary ROS: negative for - dysuria, hematuria, incontinence or urinary frequency/urgency Musculoskeletal ROS: left leg pain Neurological ROS: as noted in HPI Dermatological ROS: negative for rash and skin lesion changes  Physical Examination: Blood pressure (!) 214/99, pulse  (!) 105, temperature 98.7 F (37.1 C), temperature source Rectal, resp. rate 20, height 5\' 7"  (1.702 m), weight 44.5 kg (98 lb), SpO2 92 %.  HEENT-  Normocephalic, no lesions, without obvious abnormality.  Normal external eye and conjunctiva.  Normal TM's bilaterally.  Normal auditory canals and external ears. Normal external nose, mucus membranes and septum.  Normal pharynx. Cardiovascular- S1, S2 normal, pulses palpable throughout   Lungs- chest clear, no wheezing, rales, normal symmetric air entry Abdomen- soft, non-tender; bowel sounds normal; no masses,  no organomegaly Extremities- no edema Lymph-no adenopathy palpable Musculoskeletal-no joint tenderness, deformity or swelling Skin-multiple echhymoses  Neurological Examination   Mental Status: Alert, oriented to name, place, month and age.  Speech fluent without evidence of aphasia.  Dysarthric.  Able to follow commands. Cranial Nerves: II: Discs flat bilaterally; Able to count fingers in both visual fields, pupils irregular III,IV, VI: ptosis not present, right gaze deviation V,VII: left facial droop, facial light touch sensation decreased on the left  VIII: hearing normal bilaterally IX,X: gag reflex reduced XI: bilateral shoulder shrug XII: midline tongue extension Motor: Right : Upper extremity   5/5    Left:     Upper extremity   0/5  Lower extremity   3-/5     Lower extremity   0/5 Tone and bulk:normal tone throughout; no atrophy noted Sensory: Pinprick and light touch absent on the left Deep Tendon Reflexes: 2+ and symmetric with absent AJ's Plantars: Right: upgoing   Left: upgoing Cerebellar: Unable to perform due to weakness Gait: not tested due to safety concerns    Laboratory Studies:  Basic Metabolic Panel: No results for input(s): NA, K, CL, CO2, GLUCOSE, BUN, CREATININE, CALCIUM, MG, PHOS in the last 168 hours.  Liver Function Tests: No results for input(s): AST, ALT, ALKPHOS, BILITOT, PROT, ALBUMIN in the  last 168 hours. No results for input(s): LIPASE, AMYLASE in the last 168 hours. No results for input(s): AMMONIA in the last 168 hours.  CBC: Recent Labs  Lab 06/21/18 1102  WBC 8.6  NEUTROABS 7.2*  HGB 11.8*  HCT 35.6  MCV 87.0  PLT 250    Cardiac Enzymes: No results for input(s): CKTOTAL, CKMB, CKMBINDEX, TROPONINI in the last 168 hours.  BNP: Invalid input(s): POCBNP  CBG: Recent Labs  Lab 06/21/18 1105  GLUCAP 159*    Microbiology: Results for orders placed or performed during the hospital encounter of 03/07/18  Urine culture     Status: None   Collection Time: 03/07/18  3:40 PM  Result Value Ref Range Status   Specimen Description   Final    URINE, RANDOM Performed at [facility], [address] [place], [place] [address]    Special Requests  Final    NONE Performed at [facility], [address], [place], [place] [address]    Culture   Final    NO GROWTH Performed at [facility], [place] [address], [place], [place] [address]    Report Status 03/08/2018 FINAL  Final  MRSA PCR Screening     Status: None   Collection Time: 03/07/18  9:22 PM  Result Value Ref Range Status   MRSA by PCR NEGATIVE NEGATIVE Final    Comment:        The GeneXpert MRSA Assay (FDA approved for NASAL specimens only), is one component of a comprehensive MRSA colonization surveillance program. It is not intended to diagnose MRSA infection nor to guide or monitor treatment for MRSA infections. Performed at [facility], [address] [place], [place] [address]     Coagulation Studies: Recent Labs    06/21/18 1102  LABPROT 13.3  INR 1.02    Urinalysis: No results for input(s): COLORURINE, LABSPEC, PHURINE, GLUCOSEU, HGBUR, BILIRUBINUR, KETONESUR, PROTEINUR, UROBILINOGEN, NITRITE, LEUKOCYTESUR in the last 168 hours.  Invalid input(s): APPERANCEUR  Lipid Panel:    Component Value Date/Time   CHOL 110 01/23/2018 0553   TRIG 72  01/23/2018 0553   HDL 39 (L) 01/23/2018 0553   CHOLHDL 2.8 01/23/2018 0553   VLDL 14 01/23/2018 0553   LDLCALC 57 01/23/2018 0553    HgbA1C: No results found for: HGBA1C  Urine Drug Screen:      Component Value Date/Time   LABOPIA NONE DETECTED 01/22/2018 1700   COCAINSCRNUR NONE DETECTED 01/22/2018 1700   LABBENZ NONE DETECTED 01/22/2018 1700   AMPHETMU NONE DETECTED 01/22/2018 1700   THCU NONE DETECTED 01/22/2018 1700   LABBARB NONE DETECTED 01/22/2018 1700    Alcohol Level: No results for input(s): ETH in the last 168 hours.   Imaging: Ct Head Code Stroke Wo Contrast  Result Date: 06/21/2018 CLINICAL DATA:  Code stroke. Focal neuro deficit, less than 6 hours, stroke suspected. Patient found unresponsive 1 hour ago. Acute onset of left-sided weakness and facial droop with ice fixed to the right. EXAM: CT HEAD WITHOUT CONTRAST TECHNIQUE: Contiguous axial images were obtained from the base of the skull through the vertex without intravenous contrast. COMPARISON:  CT head without contrast 02/22/2018 FINDINGS: Brain: Atrophy and moderate diffuse white matter changes are again seen bilaterally. No acute cortical infarct, hemorrhage, or mass lesion is present. The basal ganglia are intact. Insular ribbon is normal bilaterally. No acute or focal right-sided infarct is present. The ventricles are proportionate to the degree of atrophy and stable. The brainstem and cerebellum are normal. Vascular: Atherosclerotic calcifications are present in the cavernous and precavernous internal carotid arteries. There is no hyperdense vessel. Skull: Calvarium is intact. No focal lytic or blastic lesions are present. Sinuses/Orbits: Paranasal sinuses and mastoid air cells are clear. Bilateral lens replacements are present. Globes and orbits are otherwise within normal limits. ASPECTS [facility] Stroke Program Early CT Score) - Ganglionic level infarction (caudate, lentiform nuclei, internal capsule, insula, M1-M3  cortex): 7/7 - Supraganglionic infarction (M4-M6 cortex): 3/3 Total score (0-10 with 10 being normal): 10/10 IMPRESSION: 1. No acute intracranial abnormality. 2. ASPECTS is 10/10 3. Moderate atrophy and white matter disease is stable bilaterally. These results were called by telephone at the time of interpretation on 06/21/2018 at 11:03 am to Dr. Harvest Dark , who verbally acknowledged these results. Electronically Signed   By: San Morelle M.D.   On: 06/21/2018 11:05  Assessment: 84 y.o. female presenting with left hemipegia and right gaze deviation.  Acute infarct suspected.  Head CT reviewed and shows no acute changes.  Contraindications to tPA reviewed.  Elevated BP treated successfully with Labetalol.  Treatment course explained to patient and son.  Risks and benefits discussed.  Verbal consent obtained from son and tPA administered.  Case discussed with [facility] and CTA/CTP requested prior to transfer.    Stroke Risk Factors - hypertension  Plan: 1. CTA head and neck/CTP 2. NPO until RN stroke swallow screen 3. Telemetry monitoring 4. Frequent neuro checks 5. Transfer to [facility]   Alexis Goodell, MD Neurology [phone] 06/21/2018, 11:37 AM  Addendum: BP again elevated.  Patient started on Cardene drip.    This patient is critically ill and at significant risk of neurological worsening, death and care requires constant monitoring of vital signs, hemodynamics,respiratory and cardiac monitoring, neurological assessment, discussion with family, other specialists and medical decision making of high complexity. I spent 90 minutes of neurocritical care time  in the care of  this patient.  Alexis Goodell, MD Neurology [phone] 06/21/2018  1:12 PM

## 2018-06-21 NOTE — ED Notes (Signed)
Pt to CT via stretcher with  RN.

## 2018-06-21 NOTE — ED Notes (Signed)
[facility] ambulance here to take pt to [facility]. Pt/family updated with plan of care.

## 2018-06-21 NOTE — ED Notes (Signed)
MD Doy Mince and pharmacist at bedside for TPA administration. Pt son at bedside and aware of care plan.

## 2018-06-21 NOTE — ED Notes (Signed)
Pt able to feel some sensation to left arm and pt opened left hand once when told to. Pt withdrawals from painful stimuli to left arm.

## 2018-06-21 NOTE — Progress Notes (Signed)
   06/21/18 1110  Clinical Encounter Type  Visited With Patient not available  Visit Type Initial;Spiritual support;ED  Referral From Nurse  Consult/Referral To Chaplain  Spiritual Encounters  Spiritual Needs Prayer   [facility] responded to a code stroke. Upon arrival to the patient's room, she was not available and patient family was talking with the dr. [facility] will follow up as needed.

## 2018-06-21 NOTE — Progress Notes (Addendum)
Pt arrived to unit with Cardene infusing at 50 mg/hr, titrated based on BP parameters and turned off at 1515. Unable to chart titrations or stop on MAR due to read-only encounter.

## 2018-06-21 NOTE — H&P (Addendum)
Chief Complaint: Left side weakness   History obtained from: Patient and Chart     HPI:                                                                                                                                       Dawn [name] is an 84 y.o. female with past medical history of hypertension, cancer, hip arthroplasty who presented to [facility] emergency room with sudden onset left-sided weakness while eating breakfast.  Her last known normal  Was 10:15 in the morning, symptoms witnessed by family.  Patient had an initial NIH stroke scale of 18 and received IV TPA at [facility].  CT angiogram shows occluded right M2 MCA branch and CT perfusion showed large score of 91 mL, therefore not a candidate for mechanical thrombectomy.  Transferred to [facility] neuro ICU for further evaluation management.  Patient's baseline she walks with a walker for short distances but mostly wheelchair-bound.  Feeds herself.  Needs help taking baths, going to the bathroom.  Date last known well: 7.26.19 Time last known well: 10.15 a.m. tPA Given: Yes NIHSS: 18 Baseline MRS 4     Past Medical History:  Diagnosis Date  . Cancer ([place])    skin cancer  . 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 Hypertension   . Sciatica     Past Surgical History:  Procedure Laterality Date  . ABDOMINAL HYSTERECTOMY    . APPENDECTOMY    . CATARACT EXTRACTION, BILATERAL    . CHOLECYSTECTOMY    . ENDARTERECTOMY FEMORAL Right 04/19/2017   Procedure: ENDARTERECTOMY COMMON FEMORAL ARTERY, PROFUNDA  FEMORIS ARTERY, and  SUPERFICIAL FEMORAL ARTERY;  Surgeon: Algernon Huxley, MD;  Location: [facility] ORS;  Service: Vascular;  Laterality: Right;  . ENDARTERECTOMY FEMORAL Left 11/14/2017   Procedure: ENDARTERECTOMY FEMORAL;  Surgeon: Algernon Huxley, MD;  Location: [facility] ORS;  Service: Vascular;  Laterality: Left;  . HIP SURGERY Bilateral   .  LOWER EXTREMITY ANGIOGRAM Right 04/19/2017   Procedure: LOWER EXTREMITY ANGIOGRAM WITH SUPERFICIAL FEMORAL ARTERY STENT PLACEMENT;  Surgeon: Algernon Huxley, MD;  Location: [facility] ORS;  Service: Vascular;  Laterality: Right;  . LOWER EXTREMITY ANGIOGRAPHY Right 04/18/2017   Procedure: Lower Extremity Angiography;  Surgeon: Algernon Huxley, MD;  Location: [place] [facility];  Service: Cardiovascular;  Laterality: Right;  . LOWER EXTREMITY ANGIOGRAPHY Left 07/12/2017   Procedure: Lower Extremity Angiography;  Surgeon: Algernon Huxley, MD;  Location: [place] [facility];  Service: Cardiovascular;  Laterality: Left;  . LOWER EXTREMITY ANGIOGRAPHY Left 11/09/2017   Procedure: LOWER EXTREMITY ANGIOGRAPHY;  Surgeon: Algernon Huxley, MD;  Location: [place] [facility];  Service: Cardiovascular;  Laterality: Left;  . LOWER EXTREMITY INTERVENTION  07/12/2017   Procedure: Lower Extremity Intervention;  Surgeon: Algernon Huxley, MD;  Location: [place] [facility];  Service: Cardiovascular;;  . LOWER EXTREMITY INTERVENTION  11/09/2017   Procedure: LOWER EXTREMITY INTERVENTION;  Surgeon: Algernon Huxley, MD;  Location: [place] [facility];  Service: Cardiovascular;;  . RENAL ANGIOGRAPHY N/A 06/21/2017   Procedure: Renal Angiography;  Surgeon: Algernon Huxley, MD;  Location: [place] [facility];  Service: Cardiovascular;  Laterality: N/A;  . SHOULDER SURGERY Bilateral   . TOTAL HIP ARTHROPLASTY Bilateral     Family History  Problem Relation Age of Onset  . Diabetes Father   . Heart disease Father    Social History:  reports that she quit smoking about 39 years ago. Her smoking use included cigarettes. She quit after 30.00 years of use. She has never used smokeless tobacco. She reports that she does not drink alcohol or use drugs.  Allergies:  Allergies  Allergen Reactions  . Percocet [Oxycodone-Acetaminophen] Itching  . Percodan [Oxycodone-Aspirin] Itching    Medications:                                                                                                                         I reviewed home medications   ROS:                                                                                                                                     14 systems reviewed and negative except above    Examination:                                                                                                      General: Appears well-developed and well-nourished.  Psych: Affect appropriate to situation Eyes: No scleral injection HENT: No OP obstrucion Head: Normocephalic.  Cardiovascular: Normal rate and regular rhythm.  Respiratory: Effort normal and breath sounds normal to anterior ascultation GI: Soft.  No distension. There is no tenderness.  Skin: WDI    Neurological Examination Mental Status: Alert, severely dysarthric, answers name. Cranial Nerves:  II: left pulmonary mass hemianopsia III,IV, VI: Forced gaze deviation to the right  VII: Facial droop  XII: midline tongue extension Motor: Right : Upper extremity   5/5    Left:     Upper extremity   0/5  Lower extremity   5/5     Lower extremity   0/5 Tone and bulk:normal tone throughout; no atrophy noted Sensory: Neglect on the left arm and left leg Deep Tendon Reflexes: 2+ and symmetric throughout Plantars: Right: downgoing   Left: downgoing Gait: unable to assess   Lab Results: Basic Metabolic Panel: Recent Labs  Lab 06/21/18 1102  NA 138  K 4.1  CL 99  CO2 28  GLUCOSE 185*  BUN 31*  CREATININE 1.10*  CALCIUM 9.5    CBC: Recent Labs  Lab 06/21/18 1102  WBC 8.6  NEUTROABS 7.2*  HGB 11.8*  HCT 35.6  MCV 87.0  PLT 250    Coagulation Studies: Recent Labs    06/21/18 1102  LABPROT 13.3  INR 1.02    Imaging: Ct Angio Head W Or Wo Contrast  Result Date: 06/21/2018 CLINICAL DATA:  Stroke, follow-up. Acute onset of left-sided weakness and facial droop with ice fixed to the right. EXAM: CT ANGIOGRAPHY HEAD AND  NECK CT PERFUSION BRAIN TECHNIQUE: Multidetector CT imaging of the head and neck was performed using the standard protocol during bolus administration of intravenous contrast. Multiplanar CT image reconstructions and MIPs were obtained to evaluate the vascular anatomy. Carotid stenosis measurements (when applicable) are obtained utilizing NASCET criteria, using the distal internal carotid diameter as the denominator. Multiphase CT imaging of the brain was performed following IV bolus contrast injection. Subsequent parametric perfusion maps were calculated using RAPID software. CONTRAST:  160mL ISOVUE-370 IOPAMIDOL (ISOVUE-370) INJECTION 76% COMPARISON:  CT head without contrast from the same day. MRI brain and MRA circle-of-Willis 01/23/2018. FINDINGS: CTA NECK FINDINGS Aortic arch: A 3 vessel arch configuration is present. Extensive atherosclerotic calcifications are present without a significant stenosis of the great vessel origins. There is no aneurysm. Right carotid system: Atherosclerotic calcifications are present along the wall of the right common carotid artery without a significant stenosis. There is calcified and noncalcified plaque through the right carotid bifurcation. There is no focal luminal stenosis relative to the more distal vessel. The cervical right ICA is within normal limits. Left carotid system: The left common carotid artery demonstrates mural calcifications without a significant luminal stenosis. Dense calcifications are present at the left carotid bifurcation. The lumen is narrowed to less than 1 mm. Additional distal calcifications are present in the proximal left ICA without a significant tandem stenosis. Vertebral arteries: The right vertebral artery originates from the subclavian artery without significant stenosis. There is some tortuosity and calcification of the cervical right ICA without a significant stenosis. The left vertebral artery is occluded below the C1-2 level. Skeleton:  Multilevel degenerative changes are noted in the cervical spine. Degenerative anterolisthesis is present at C3-4 and to a greater extent at C4-5. There is chronic loss of disc space at C5-6 and C6-7. No focal lytic or blastic lesions are present. Alignment is otherwise anatomic. Other neck: The soft tissues the neck are otherwise unremarkable. There is no significant adenopathy. Thyroid is within normal limits. Salivary glands are unremarkable. Upper chest: Mild dependent atelectasis is present. The upper lung fields are otherwise clear without focal nodule, mass, or airspace disease. Ill-defined density in the left upper lobe is stable, likely remote scarring. Review of the MIP images confirms the  above findings CTA HEAD FINDINGS Anterior circulation: Atherosclerotic calcifications are present through the cavernous internal carotid arteries bilaterally. There is no significant luminal stenosis relative to the ICA termini. The right A1 segment is dominant. There is mild narrowing of the proximal left M1 segment. The middle right M2 branch is occluded. There is distal left MCA atherosclerotic disease without focal stenosis or occlusion. Significant attenuation of branch vessels in the distal right MCA territory. No significant collaterals are present. Posterior circulation: The left vertebral artery is reconstituted at C1. Dense calcification is present at the dural margin of the vertebral artery on the left. The distal vertebral artery is opacified. The right vertebral artery is within normal limits. PICA origin is high. Basilar artery is within normal limits. Both posterior cerebral arteries originate from the basilar tip. There is mild distal small vessel disease without a significant proximal stenosis or occlusion. Venous sinuses: The dural sinuses are patent. The right transverse sinus is dominant. The straight sinus and deep cerebral veins are intact. Cortical veins are unremarkable. Anatomic variants: None  Delayed phase: Postcontrast images demonstrate early loss of gray-white differentiation in the anterior right frontal lobe. Review of the MIP images confirms the above findings CT Brain Perfusion Findings: CBF (<30%) Volume: 69mL Perfusion (Tmax>6.0s) volume: 16mL Mismatch Volume: 15mL Infarction Location:Right frontal lobe IMPRESSION: 1. Acute infarct of the right frontal lobe. Core infarct volume is 91 mL. 2. Occluded right M2 branch at the MCA bifurcation without distal reconstitution or collateral vessels. This corresponds to the infarct territory 3. Mild narrowing of the left M1 segment as described. 4. Diffuse distal small vessel disease without other significant proximal stenosis or occlusion in the circle-of-Willis. 5. High-grade stenosis of the left internal carotid artery at the bifurcation. 6. Atherosclerotic changes involving the right carotid bifurcation without significant stenosis. 7. Extensive atherosclerotic disease at the aortic arch without significant stenosis. 8. 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 It is reconstituted at C1-2 level. 9. Multilevel degenerative changes in the cervical spine These results were called by telephone at the time of interpretation on 06/21/2018 at 12:52 pm to Dr. Alexis Goodell , who verbally acknowledged these results. Electronically Signed   By: San Morelle M.D.   On: 06/21/2018 13:01   Ct Angio Neck W Or Wo Contrast  Result Date: 06/21/2018 CLINICAL DATA:  Stroke, follow-up. Acute onset of left-sided weakness and facial droop with ice fixed to the right. EXAM: CT ANGIOGRAPHY HEAD AND NECK CT PERFUSION BRAIN TECHNIQUE: Multidetector CT imaging of the head and neck was performed using the standard protocol during bolus administration of intravenous contrast. Multiplanar CT image reconstructions and MIPs were obtained to evaluate the vascular anatomy. Carotid stenosis measurements (when applicable) are obtained utilizing NASCET criteria, using the distal  internal carotid diameter as the denominator. Multiphase CT imaging of the brain was performed following IV bolus contrast injection. Subsequent parametric perfusion maps were calculated using RAPID software. CONTRAST:  128mL ISOVUE-370 IOPAMIDOL (ISOVUE-370) INJECTION 76% COMPARISON:  CT head without contrast from the same day. MRI brain and MRA circle-of-Willis 01/23/2018. FINDINGS: CTA NECK FINDINGS Aortic arch: A 3 vessel arch configuration is present. Extensive atherosclerotic calcifications are present without a significant stenosis of the great vessel origins. There is no aneurysm. Right carotid system: Atherosclerotic calcifications are present along the wall of the right common carotid artery without a significant stenosis. There is calcified and noncalcified plaque through the right carotid bifurcation. There is no focal luminal stenosis relative to the more distal vessel. The cervical right  ICA is within normal limits. Left carotid system: The left common carotid artery demonstrates mural calcifications without a significant luminal stenosis. Dense calcifications are present at the left carotid bifurcation. The lumen is narrowed to less than 1 mm. Additional distal calcifications are present in the proximal left ICA without a significant tandem stenosis. Vertebral arteries: The right vertebral artery originates from the subclavian artery without significant stenosis. There is some tortuosity and calcification of the cervical right ICA without a significant stenosis. The left vertebral artery is occluded below the C1-2 level. Skeleton: Multilevel degenerative changes are noted in the cervical spine. Degenerative anterolisthesis is present at C3-4 and to a greater extent at C4-5. There is chronic loss of disc space at C5-6 and C6-7. No focal lytic or blastic lesions are present. Alignment is otherwise anatomic. Other neck: The soft tissues the neck are otherwise unremarkable. There is no significant  adenopathy. Thyroid is within normal limits. Salivary glands are unremarkable. Upper chest: Mild dependent atelectasis is present. The upper lung fields are otherwise clear without focal nodule, mass, or airspace disease. Ill-defined density in the left upper lobe is stable, likely remote scarring. Review of the MIP images confirms the above findings CTA HEAD FINDINGS Anterior circulation: Atherosclerotic calcifications are present through the cavernous internal carotid arteries bilaterally. There is no significant luminal stenosis relative to the ICA termini. The right A1 segment is dominant. There is mild narrowing of the proximal left M1 segment. The middle right M2 branch is occluded. There is distal left MCA atherosclerotic disease without focal stenosis or occlusion. Significant attenuation of branch vessels in the distal right MCA territory. No significant collaterals are present. Posterior circulation: The left vertebral artery is reconstituted at C1. Dense calcification is present at the dural margin of the vertebral artery on the left. The distal vertebral artery is opacified. The right vertebral artery is within normal limits. PICA origin is high. Basilar artery is within normal limits. Both posterior cerebral arteries originate from the basilar tip. There is mild distal small vessel disease without a significant proximal stenosis or occlusion. Venous sinuses: The dural sinuses are patent. The right transverse sinus is dominant. The straight sinus and deep cerebral veins are intact. Cortical veins are unremarkable. Anatomic variants: None Delayed phase: Postcontrast images demonstrate early loss of gray-white differentiation in the anterior right frontal lobe. Review of the MIP images confirms the above findings CT Brain Perfusion Findings: CBF (<30%) Volume: 57mL Perfusion (Tmax>6.0s) volume: 144mL Mismatch Volume: 25mL Infarction Location:Right frontal lobe IMPRESSION: 1. Acute infarct of the right  frontal lobe. Core infarct volume is 91 mL. 2. Occluded right M2 branch at the MCA bifurcation without distal reconstitution or collateral vessels. This corresponds to the infarct territory 3. Mild narrowing of the left M1 segment as described. 4. Diffuse distal small vessel disease without other significant proximal stenosis or occlusion in the circle-of-Willis. 5. High-grade stenosis of the left internal carotid artery at the bifurcation. 6. Atherosclerotic changes involving the right carotid bifurcation without significant stenosis. 7. Extensive atherosclerotic disease at the aortic arch without significant stenosis. 8. 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 It is reconstituted at C1-2 level. 9. Multilevel degenerative changes in the cervical spine These results were called by telephone at the time of interpretation on 06/21/2018 at 12:52 pm to Dr. Alexis Goodell , who verbally acknowledged these results. Electronically Signed   By: San Morelle M.D.   On: 06/21/2018 13:01   Ct Cerebral Perfusion W Contrast  Result Date: 06/21/2018 CLINICAL DATA:  Stroke, follow-up. Acute onset of left-sided weakness and facial droop with ice fixed to the right. EXAM: CT ANGIOGRAPHY HEAD AND NECK CT PERFUSION BRAIN TECHNIQUE: Multidetector CT imaging of the head and neck was performed using the standard protocol during bolus administration of intravenous contrast. Multiplanar CT image reconstructions and MIPs were obtained to evaluate the vascular anatomy. Carotid stenosis measurements (when applicable) are obtained utilizing NASCET criteria, using the distal internal carotid diameter as the denominator. Multiphase CT imaging of the brain was performed following IV bolus contrast injection. Subsequent parametric perfusion maps were calculated using RAPID software. CONTRAST:  171mL ISOVUE-370 IOPAMIDOL (ISOVUE-370) INJECTION 76% COMPARISON:  CT head without contrast from the same day. MRI brain and MRA circle-of-Willis  01/23/2018. FINDINGS: CTA NECK FINDINGS Aortic arch: A 3 vessel arch configuration is present. Extensive atherosclerotic calcifications are present without a significant stenosis of the great vessel origins. There is no aneurysm. Right carotid system: Atherosclerotic calcifications are present along the wall of the right common carotid artery without a significant stenosis. There is calcified and noncalcified plaque through the right carotid bifurcation. There is no focal luminal stenosis relative to the more distal vessel. The cervical right ICA is within normal limits. Left carotid system: The left common carotid artery demonstrates mural calcifications without a significant luminal stenosis. Dense calcifications are present at the left carotid bifurcation. The lumen is narrowed to less than 1 mm. Additional distal calcifications are present in the proximal left ICA without a significant tandem stenosis. Vertebral arteries: The right vertebral artery originates from the subclavian artery without significant stenosis. There is some tortuosity and calcification of the cervical right ICA without a significant stenosis. The left vertebral artery is occluded below the C1-2 level. Skeleton: Multilevel degenerative changes are noted in the cervical spine. Degenerative anterolisthesis is present at C3-4 and to a greater extent at C4-5. There is chronic loss of disc space at C5-6 and C6-7. No focal lytic or blastic lesions are present. Alignment is otherwise anatomic. Other neck: The soft tissues the neck are otherwise unremarkable. There is no significant adenopathy. Thyroid is within normal limits. Salivary glands are unremarkable. Upper chest: Mild dependent atelectasis is present. The upper lung fields are otherwise clear without focal nodule, mass, or airspace disease. Ill-defined density in the left upper lobe is stable, likely remote scarring. Review of the MIP images confirms the above findings CTA HEAD FINDINGS  Anterior circulation: Atherosclerotic calcifications are present through the cavernous internal carotid arteries bilaterally. There is no significant luminal stenosis relative to the ICA termini. The right A1 segment is dominant. There is mild narrowing of the proximal left M1 segment. The middle right M2 branch is occluded. There is distal left MCA atherosclerotic disease without focal stenosis or occlusion. Significant attenuation of branch vessels in the distal right MCA territory. No significant collaterals are present. Posterior circulation: The left vertebral artery is reconstituted at C1. Dense calcification is present at the dural margin of the vertebral artery on the left. The distal vertebral artery is opacified. The right vertebral artery is within normal limits. PICA origin is high. Basilar artery is within normal limits. Both posterior cerebral arteries originate from the basilar tip. There is mild distal small vessel disease without a significant proximal stenosis or occlusion. Venous sinuses: The dural sinuses are patent. The right transverse sinus is dominant. The straight sinus and deep cerebral veins are intact. Cortical veins are unremarkable. Anatomic variants: None Delayed phase: Postcontrast images demonstrate early loss of gray-white differentiation in the anterior  right frontal lobe. Review of the MIP images confirms the above findings CT Brain Perfusion Findings: CBF (<30%) Volume: 36mL Perfusion (Tmax>6.0s) volume: 137mL Mismatch Volume: 91mL Infarction Location:Right frontal lobe IMPRESSION: 1. Acute infarct of the right frontal lobe. Core infarct volume is 91 mL. 2. Occluded right M2 branch at the MCA bifurcation without distal reconstitution or collateral vessels. This corresponds to the infarct territory 3. Mild narrowing of the left M1 segment as described. 4. Diffuse distal small vessel disease without other significant proximal stenosis or occlusion in the circle-of-Willis. 5.  High-grade stenosis of the left internal carotid artery at the bifurcation. 6. Atherosclerotic changes involving the right carotid bifurcation without significant stenosis. 7. Extensive atherosclerotic disease at the aortic arch without significant stenosis. 8. 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 It is reconstituted at C1-2 level. 9. Multilevel degenerative changes in the cervical spine These results were called by telephone at the time of interpretation on 06/21/2018 at 12:52 pm to Dr. Alexis Goodell , who verbally acknowledged these results. Electronically Signed   By: San Morelle M.D.   On: 06/21/2018 13:01   Ct Head Code Stroke Wo Contrast  Result Date: 06/21/2018 CLINICAL DATA:  Code stroke. Focal neuro deficit, less than 6 hours, stroke suspected. Patient found unresponsive 1 hour ago. Acute onset of left-sided weakness and facial droop with ice fixed to the right. EXAM: CT HEAD WITHOUT CONTRAST TECHNIQUE: Contiguous axial images were obtained from the base of the skull through the vertex without intravenous contrast. COMPARISON:  CT head without contrast 02/22/2018 FINDINGS: Brain: Atrophy and moderate diffuse white matter changes are again seen bilaterally. No acute cortical infarct, hemorrhage, or mass lesion is present. The basal ganglia are intact. Insular ribbon is normal bilaterally. No acute or focal right-sided infarct is present. The ventricles are proportionate to the degree of atrophy and stable. The brainstem and cerebellum are normal. Vascular: Atherosclerotic calcifications are present in the cavernous and precavernous internal carotid arteries. There is no hyperdense vessel. Skull: Calvarium is intact. No focal lytic or blastic lesions are present. Sinuses/Orbits: Paranasal sinuses and mastoid air cells are clear. Bilateral lens replacements are present. Globes and orbits are otherwise within normal limits. ASPECTS [facility] Stroke Program Early CT Score) - Ganglionic level  infarction (caudate, lentiform nuclei, internal capsule, insula, M1-M3 cortex): 7/7 - Supraganglionic infarction (M4-M6 cortex): 3/3 Total score (0-10 with 10 being normal): 10/10 IMPRESSION: 1. No acute intracranial abnormality. 2. ASPECTS is 10/10 3. Moderate atrophy and white matter disease is stable bilaterally. These results were called by telephone at the time of interpretation on 06/21/2018 at 11:03 am to Dr. Harvest Dark , who verbally acknowledged these results. Electronically Signed   By: San Morelle M.D.   On: 06/21/2018 11:05     ASSESSMENT AND PLAN  84 y.o. female with past medical history of hypertension, cancer admitted for right MCA stroke status post TPA.  Patient still has severe left-sided hemiplegia and neglect.   Acute Ischemic Stroke    #Transthoracic Echo  #Hold antiplatelets until 24-hour post TPA  #Repeat CT scan ordered in 24 hours #Start or continue Atorvastatin 40 mg daily after she passes a swallow eval # BP goal: permissive HTN to 180/105 mg mercury which is lower goal due to receiving TPA # HBAIC and Lipid profile # Telemetry monitoring # Frequent neuro checks # NPO until passes stroke swallow screen  HTN emergency Labetalol ordered for blood pressures over 188 systolic Permissive hypertension   DVT PPX SCD Code status:DNR  Please  page stroke NP  Or  PA  Or MD from 8am -4 pm  as this patient from this time will be  followed by the stroke.   You can look them up on [email or web address]  Password [facility]   Dianna Deshler [facility] Pager Number [phone]

## 2018-06-21 NOTE — Progress Notes (Signed)
CODE STROKE- PHARMACY COMMUNICATION   Time CODE STROKE called/page received:[phone]  Time response to CODE STROKE was made (in person or via phone): 1125  Time Stroke Kit retrieved from [place] (only if needed): 1120  Name of Provider/Nurse contacted:N/A  Past Medical History:  Diagnosis Date  . Cancer ([place])    skin cancer  . 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 Hypertension   . Sciatica    Prior to Admission medications   Medication Sig Start Date End Date Taking? Authorizing Provider  aspirin EC 81 MG tablet Take 81 mg by mouth daily.   Yes [provider]  atorvastatin (LIPITOR) 20 MG tablet TAKE 1 TABLET BY MOUTH ONCE DAILY AT  6PM 08/29/17  Yes Dew, Erskine Squibb, MD  Cholecalciferol (VITAMIN D3) 400 units CAPS Take 400 Units by mouth daily.   Yes [provider]  cloNIDine (CATAPRES) 0.1 MG tablet Take 1 tablet (0.1 mg total) by mouth 2 (two) times daily. Patient taking differently: Take 0.1 mg by mouth 2 (two) times daily.  06/08/16  Yes Daymon Larsen, MD  clopidogrel (PLAVIX) 75 MG tablet Take 1 tablet (75 mg total) by mouth daily. 01/23/18  Yes Sudini, Alveta Heimlich, MD  Cyanocobalamin (B-12) 5000 MCG CAPS Take 5,000 mcg by mouth daily.    Yes [provider]  doxycycline (VIBRA-TABS) 100 MG tablet Take 100 mg by mouth 2 (two) times daily. 06/20/18  Yes [provider]  ferrous sulfate 325 (65 FE) MG tablet Take 1 tablet (325 mg total) by mouth 2 (two) times daily with a meal. 03/08/18  Yes Vaughan Basta, MD  metoprolol succinate (TOPROL-XL) 25 MG 24 hr tablet Take 25 mg by mouth daily.  03/19/18 03/19/19 Yes [provider]  acetaminophen (TYLENOL) 325 MG tablet Take 2 tablets (650 mg total) by mouth every 6 (six) hours as needed for mild pain (or Fever >/= 101). 01/14/18   Loletha Grayer, MD  acidophilus (RISAQUAD) CAPS capsule Take 1 capsule by mouth daily. Patient not taking:  Reported on 06/21/2018 11/23/17   Algernon Huxley, MD  amLODipine (NORVASC) 10 MG tablet Take 1 tablet (10 mg total) by mouth daily. Patient not taking: Reported on 06/21/2018 11/23/17   Algernon Huxley, MD  furosemide (LASIX) 20 MG tablet TAKE 1 TABLET BY MOUTH ONCE DAILY AS NEEDED FOR EDEMA 05/20/18   [provider]  ipratropium-albuterol (DUONEB) 0.5-2.5 (3) MG/3ML SOLN Take 3 mLs by nebulization every 6 (six) hours as needed. Patient taking differently: Take 3 mLs by nebulization every 6 (six) hours as needed. For shortness of breath 01/11/18   Demetrios Loll, MD  Multiple Minerals-Vitamins (LEG-GESIC PO) Take 0.5 tablets by mouth at bedtime as needed. For restless leg syndrome (legatrin pm)    [provider]  ondansetron (ZOFRAN-ODT) 4 MG disintegrating tablet Take 4 mg by mouth every 6 (six) hours as needed for nausea.    [provider]  pantoprazole (PROTONIX) 40 MG tablet Take 1 tablet (40 mg total) by mouth 2 (two) times daily before a meal. Patient not taking: Reported on 06/21/2018 01/11/18   Demetrios Loll, MD  predniSONE (DELTASONE) 10 MG tablet Take 10 mg by mouth taper from 4 doses each day to 1 dose and stop.  06/20/18   [provider]  senna-docusate (SENOKOT-S) 8.6-50 MG tablet Take 1 tablet by mouth 2 (two) times daily as needed for mild constipation. Patient not taking: Reported on  06/21/2018 01/14/18   Loletha Grayer, MD  traMADol (ULTRAM) 50 MG tablet Take 1 tablet (50 mg total) by mouth every 6 (six) hours as needed. Patient not taking: Reported on 06/21/2018 01/11/18   Demetrios Loll, MD  guaifenesin (ROBITUSSIN) 100 MG/5ML syrup Take 400 mg by mouth 3 (three) times daily as needed for cough.  06/21/18  [provider]  ketorolac (ACULAR) 0.4 % SOLN Place 1 drop into the left eye 3 (three) times daily.   06/21/18  [provider]  Misc Natural Products (CYSTEX) LIQD Take 15 mLs by mouth daily.  06/21/18  [provider]     Charlett Nose ,PharmD Clinical Pharmacist  06/21/2018  11:39 AM

## 2018-06-21 NOTE — ED Notes (Signed)
Returned back from CT. Pt withdrawals left arm to pain and has sensation to left leg. Unable to move left leg. R arm and leg with no changes in status from previous assessment. Speech remains slurred. Pt son at bedside and updated with plan of care to transfer to [facility] ICU. Will continue to monitor.

## 2018-06-21 NOTE — ED Notes (Signed)
CODE STROKE CALLED TO [phone] 

## 2018-06-21 NOTE — ED Notes (Signed)
Report called to Gala Murdoch, RN on neuro ICU at [facility].

## 2018-06-21 NOTE — ED Triage Notes (Signed)
Pt arrived via EMS from home with reports of sudden onset of left side weakness and facial droop and eyes fixed to the right.  Per EMS, last known well was about an hour prior to arrival.  Per EMS son had seen patient and left to go to the store. Per EMS son reported he was only gone for about 20 minutes. Son reported to EMS that pt was normal and sitting in wheelchair prior to leaving for the store.   Per EMS pt has hx of TIAs and was taken off plavix but recently has been started back on plavix.

## 2018-06-21 NOTE — ED Provider Notes (Addendum)
[facility] Emergency Department Provider Note  Time seen: 11:18 AM  I have reviewed the triage vital signs and the nursing notes.   HISTORY  Chief Complaint Code Stroke    HPI Dawn [name] is a 84 y.o. female with a past medical history of hypertension, TIA, COPD, presents to the emergency department with left-sided weakness.  According EMS report family found the patient approxi-1 hour ago after being gone for approximately 1 hour found her to be somewhat altered, gazing off to the right not able to move her left side.  Patient is awake and alert able to answer questions with some accuracy at this time.  Patient cannot give a good timeline however.  Upon arrival via emergency traffic patient is hypertensive 109 systolic per EMS.  Patient brought emergently to [place].  No hemorrhagic stroke seen on my evaluation.  Patient brought back to the room.  Patient is able to follow commands.  Has complete paralysis the left side.  Appears to have left-sided hemineglect cannot gaze past the midline.  Gaze remains to the right.  Awaiting family arrival for further timeline.  Dr. Doy Mince, neurology is in with the patient as well.   Past Medical History:  Diagnosis Date  . Cancer ([place])    skin cancer  . 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 Hypertension   . Sciatica     Patient Active Problem List   Diagnosis Date Noted  . Symptomatic anemia 03/07/2018  . TIA (transient ischemic attack) 01/22/2018  . Pressure injury of skin 01/04/2018  . Sepsis ([place]) 01/03/2018  . Protein-calorie malnutrition, severe 11/14/2017  . Atherosclerosis of native arteries of extremity with rest pain ([place]) 11/09/2017  . Ischemic leg 11/09/2017  . Atherosclerotic peripheral vascular disease with rest pain ([place]) 11/09/2017  . Atypical chest pain 09/21/2017  . Renal artery stenosis ([place]) 06/15/2017  . Hyperlipidemia 05/09/2017   . Essential hypertension 10/17/2016  . Carotid stenosis 10/17/2016  . PVD (peripheral vascular disease) ([place]) 10/17/2016  . B12 deficiency 07/19/2016  . Vitamin D deficiency, unspecified 07/19/2016  . History of right hip replacement 11/04/2015  . Aftercare following bilateral hip joint replacement surgery 11/05/2014  . SCC (squamous cell carcinoma) 05/21/2014  . COPD with asthma ([place]) 03/16/2014  . S/P hip replacement 10/03/2012    Past Surgical History:  Procedure Laterality Date  . ABDOMINAL HYSTERECTOMY    . APPENDECTOMY    . CATARACT EXTRACTION, BILATERAL    . CHOLECYSTECTOMY    . ENDARTERECTOMY FEMORAL Right 04/19/2017   Procedure: ENDARTERECTOMY COMMON FEMORAL ARTERY, PROFUNDA  FEMORIS ARTERY, and  SUPERFICIAL FEMORAL ARTERY;  Surgeon: Algernon Huxley, MD;  Location: [facility] ORS;  Service: Vascular;  Laterality: Right;  . ENDARTERECTOMY FEMORAL Left 11/14/2017   Procedure: ENDARTERECTOMY FEMORAL;  Surgeon: Algernon Huxley, MD;  Location: [facility] ORS;  Service: Vascular;  Laterality: Left;  . HIP SURGERY Bilateral   . LOWER EXTREMITY ANGIOGRAM Right 04/19/2017   Procedure: LOWER EXTREMITY ANGIOGRAM WITH SUPERFICIAL FEMORAL ARTERY STENT PLACEMENT;  Surgeon: Algernon Huxley, MD;  Location: [facility] ORS;  Service: Vascular;  Laterality: Right;  . LOWER EXTREMITY ANGIOGRAPHY Right 04/18/2017   Procedure: Lower Extremity Angiography;  Surgeon: Algernon Huxley, MD;  Location: [place] CV LAB;  Service: Cardiovascular;  Laterality: Right;  . LOWER EXTREMITY ANGIOGRAPHY Left 07/12/2017   Procedure: Lower Extremity Angiography;  Surgeon: Algernon Huxley, MD;  Location: [facility] INVASIVE CV  LAB;  Service: Cardiovascular;  Laterality: Left;  . LOWER EXTREMITY ANGIOGRAPHY Left 11/09/2017   Procedure: LOWER EXTREMITY ANGIOGRAPHY;  Surgeon: Algernon Huxley, MD;  Location: [place] CV LAB;  Service: Cardiovascular;  Laterality: Left;  . LOWER EXTREMITY INTERVENTION  07/12/2017   Procedure: Lower Extremity  Intervention;  Surgeon: Algernon Huxley, MD;  Location: [place] CV LAB;  Service: Cardiovascular;;  . LOWER EXTREMITY INTERVENTION  11/09/2017   Procedure: LOWER EXTREMITY INTERVENTION;  Surgeon: Algernon Huxley, MD;  Location: [place] CV LAB;  Service: Cardiovascular;;  . RENAL ANGIOGRAPHY N/A 06/21/2017   Procedure: Renal Angiography;  Surgeon: Algernon Huxley, MD;  Location: [place] CV LAB;  Service: Cardiovascular;  Laterality: N/A;  . SHOULDER SURGERY Bilateral   . TOTAL HIP ARTHROPLASTY Bilateral     Prior to Admission medications   Medication Sig Start Date End Date Taking? Authorizing Provider  aspirin EC 81 MG tablet Take 81 mg by mouth daily.   Yes [provider]  atorvastatin (LIPITOR) 20 MG tablet TAKE 1 TABLET BY MOUTH ONCE DAILY AT  6PM 08/29/17  Yes Dew, Erskine Squibb, MD  Cholecalciferol (VITAMIN D3) 400 units CAPS Take 400 Units by mouth daily.   Yes [provider]  cloNIDine (CATAPRES) 0.1 MG tablet Take 1 tablet (0.1 mg total) by mouth 2 (two) times daily. Patient taking differently: Take 0.1 mg by mouth 2 (two) times daily.  06/08/16  Yes Daymon Larsen, MD  clopidogrel (PLAVIX) 75 MG tablet Take 1 tablet (75 mg total) by mouth daily. 01/23/18  Yes Sudini, Alveta Heimlich, MD  Cyanocobalamin (B-12) 5000 MCG CAPS Take 5,000 mcg by mouth daily.    Yes [provider]  doxycycline (VIBRA-TABS) 100 MG tablet Take 100 mg by mouth 2 (two) times daily. 06/20/18  Yes [provider]  ferrous sulfate 325 (65 FE) MG tablet Take 1 tablet (325 mg total) by mouth 2 (two) times daily with a meal. 03/08/18  Yes Vaughan Basta, MD  metoprolol succinate (TOPROL-XL) 25 MG 24 hr tablet Take 25 mg by mouth daily.  03/19/18 03/19/19 Yes [provider]  acetaminophen (TYLENOL) 325 MG tablet Take 2 tablets (650 mg total) by mouth every 6 (six) hours as needed for mild pain (or Fever >/= 101). 01/14/18   Loletha Grayer, MD  acidophilus (RISAQUAD) CAPS  capsule Take 1 capsule by mouth daily. Patient not taking: Reported on 06/21/2018 11/23/17   Algernon Huxley, MD  amLODipine (NORVASC) 10 MG tablet Take 1 tablet (10 mg total) by mouth daily. Patient not taking: Reported on 06/21/2018 11/23/17   Algernon Huxley, MD  furosemide (LASIX) 20 MG tablet TAKE 1 TABLET BY MOUTH ONCE DAILY AS NEEDED FOR EDEMA 05/20/18   [provider]  ipratropium-albuterol (DUONEB) 0.5-2.5 (3) MG/3ML SOLN Take 3 mLs by nebulization every 6 (six) hours as needed. Patient taking differently: Take 3 mLs by nebulization every 6 (six) hours as needed. For shortness of breath 01/11/18   Demetrios Loll, MD  Multiple Minerals-Vitamins (LEG-GESIC PO) Take 0.5 tablets by mouth at bedtime as needed. For restless leg syndrome (legatrin pm)    [provider]  ondansetron (ZOFRAN-ODT) 4 MG disintegrating tablet Take 4 mg by mouth every 6 (six) hours as needed for nausea.    [provider]  pantoprazole (PROTONIX) 40 MG tablet Take 1 tablet (40 mg total) by mouth 2 (two) times daily before a meal. Patient not taking: Reported on 06/21/2018 01/11/18   Demetrios Loll,  MD  predniSONE (DELTASONE) 10 MG tablet Take 10 mg by mouth taper from 4 doses each day to 1 dose and stop.  06/20/18   [provider]  senna-docusate (SENOKOT-S) 8.6-50 MG tablet Take 1 tablet by mouth 2 (two) times daily as needed for mild constipation. Patient not taking: Reported on 06/21/2018 01/14/18   Loletha Grayer, MD  traMADol (ULTRAM) 50 MG tablet Take 1 tablet (50 mg total) by mouth every 6 (six) hours as needed. Patient not taking: Reported on 06/21/2018 01/11/18   Demetrios Loll, MD    Allergies  Allergen Reactions  . Percocet [Oxycodone-Acetaminophen] Itching  . Percodan [Oxycodone-Aspirin] Itching    Family History  Problem Relation Age of Onset  . Diabetes Father   . Heart disease Father     Social History Social History   Tobacco Use  . Smoking status: Former Smoker    Years:  30.00    Types: Cigarettes    Last attempt to quit: 1980    Years since quitting: 39.5  . Smokeless tobacco: Never Used  Substance Use Topics  . Alcohol use: No  . Drug use: No    Review of systems Unable to complete an adequate/adequate review of systems secondary to some altered mental status and likely acute CVA.  ____________________________________________   PHYSICAL EXAM:  VITAL SIGNS: ED Triage Vitals  Enc Vitals Group     BP 06/21/18 1058 (!) 218/124     Pulse Rate 06/21/18 1058 (!) 106     Resp 06/21/18 1058 (!) 21     Temp --      Temp src --      SpO2 06/21/18 1058 96 %     Weight 06/21/18 1057 98 lb (44.5 kg)     Height 06/21/18 1057 5\' 7"  (1.702 m)     Head Circumference --      Peak Flow --      Pain Score --      Pain Loc --      Pain Edu? --      Excl. in [place]? --    Constitutional: Patient is awake and alert, some confusion/confabulation.  Attempts to follow commands. Eyes: Patient's gaze is deviated to the right and able to move gaze past midline.  Attempts to count fingers to the left of midline but is largely inaccurate, states she can see the fingers but she is obviously staring off to the right. ENT   Head: Normocephalic and atraumatic   Mouth/Throat: Dry mucous membranes. Cardiovascular: Normal rate, regular rhythm. Respiratory: Normal respiratory effort without tachypnea nor retractions. Breath sounds are clear Gastrointestinal: Soft and nontender. No distention.   Musculoskeletal: Nontender lower extremities Neurologic: Patient is speaking well, answering questions but is confused somewhat/confabulating.  Gaze deviates to the right does not cross midline appears to have left-sided hemineglect.  Complete paralysis and sensory loss of left upper and lower extremity.  3/5 motor in right lower extremity 4/5 motor in right upper extremity. Skin:  Skin is warm, dry and intact.  Psychiatric: Mood/affect normal for  situation  ____________________________________________    EKG  EKG reviewed and interpreted by myself shows sinus rhythm at 99 bpm, narrow QRS, normal axis, prolonged PR interval otherwise largely normal intervals with nonspecific but no concerning ST changes.  ____________________________________________    RADIOLOGY  CT had negative  ____________________________________________   INITIAL IMPRESSION / ASSESSMENT AND PLAN / ED COURSE  Pertinent labs & imaging results that were available during my care of the  patient were reviewed by me and considered in my medical decision making (see chart for details).  Patient presents to the emergency department for right eye deviation with left-sided deficits acute onset found in this condition approximate 1 hour ago possible last known well time approximately 2 hours ago waiting for family arrival for confirmation of timeline.  Patient has a DO NOT RESUSCITATE order.  Symptoms are very suggestive of CVA.  Brought emergently to CT scanner, no hemorrhagic CVA on CT.  Dr. Reynolds/neurology is in seeing the patient currently.   NIH Stroke Scale   Interval: Baseline Time: 1:25 PM Person Administering Scale: Harvest Dark  Administer stroke scale items in the order listed. Record performance in each category after each subscale exam. Do not go back and change scores. Follow directions provided for each exam technique. Scores should reflect what the patient does, not what the clinician thinks the patient can do. The clinician should record answers while administering the exam and work quickly. Except where indicated, the patient should not be coached (i.e., repeated requests to patient to make a special effort).   1a  Level of consciousness: 0=alert; keenly responsive  1b. LOC questions:  2=Performs neither task correctly  1c. LOC commands: 2=Performs neither task correctly  2.  Best Gaze: 2=forced deviation, or total gaze paresis not  overcome by oculocephalic maneuver  3.  Visual: 3=Bilateral hemianopia (blind including cortical blindness)  4. Facial Palsy: 2=Partial paralysis (total or near total paralysis of the lower face)  5a.  Motor left arm: 4=No movement  5b.  Motor right arm: 0=No drift, limb holds 90 (or 45) degrees for full 10 seconds  6a. motor left leg: 4=No movement  6b  Motor right leg:  0=No drift, limb holds 90 (or 45) degrees for full 10 seconds  7. Limb Ataxia: 2=Present in two limbs  8.  Sensory: 2=Severe to total sensory loss; patient is not aware of being touched in face, arm, leg  9. Best Language:  1=Mild to moderate aphasia; some obvious loss of fluency or facility of comprehension without significant limitation on ideas expressed or form of expression.  10. Dysarthria: 1=Mild to moderate, patient slurs at least some words and at worst, can be understood with some difficulty  11. Extinction and Inattention: 2=Profound hemi-inattention or hemi-inattention to more than one modality. Does not recognize own hand or orients only to one side of space  12. Distal motor function: 2=No voluntary extension after 5 seconds. Movement of the fingers at another time are not scored   Total:   29    CT scan read by radiology, negative for CVA.  Son has arrived confirms timeline, patient within TPA window.  After discussion with the son they wish to proceed with TPA administration.  Dr. Doy Mince in agreement.  We will push TPA, we will work on transfer to neurologic ICU.  Patient accepted to [facility], ICU.   ----------------------------------------- 1:25 PM on 06/21/2018 -----------------------------------------  Patient is resisting movement somewhat in the left upper extremity which is a definite improvement from arrival.  Continues to have a flaccid left lower extremity.  Continues to have a rightward gaze deviation with what appears to be continued hemineglect.    CRITICAL CARE Performed by: Harvest Dark   Total critical care time: 30 minutes  Critical care time was exclusive of separately billable procedures and treating other patients.  Critical care was necessary to treat or prevent imminent or life-threatening deterioration.  Critical care was time spent  personally by me on the following activities: development of treatment plan with patient and/or surrogate as well as nursing, discussions with consultants, evaluation of patient's response to treatment, examination of patient, obtaining history from patient or surrogate, ordering and performing treatments and interventions, ordering and review of laboratory studies, ordering and review of radiographic studies, pulse oximetry and re-evaluation of patient's condition.   ____________________________________________   FINAL CLINICAL IMPRESSION(S) / ED DIAGNOSES  Acute ischemic CVA    Harvest Dark, MD 06/21/18 1251    Harvest Dark, MD 06/21/18 1327

## 2018-06-22 ENCOUNTER — Inpatient Hospital Stay (HOSPITAL_COMMUNITY): Payer: Medicare Other

## 2018-06-22 DIAGNOSIS — I361 Nonrheumatic tricuspid (valve) insufficiency: Secondary | ICD-10-CM

## 2018-06-22 LAB — LIPID PANEL
Cholesterol: 103 mg/dL (ref 0–200)
HDL: 48 mg/dL (ref >40)
LDL CALC: 42 mg/dL (ref 0–99)
TRIGLYCERIDES: 67 mg/dL (ref <150)
Total CHOL/HDL Ratio: 2.1 RATIO
VLDL: 13 mg/dL (ref 0–40)

## 2018-06-22 LAB — ECHOCARDIOGRAM COMPLETE
HEIGHTINCHES: 67 in
WEIGHTICAEL: 1784.84 [oz_av]

## 2018-06-22 LAB — GLUCOSE, CAPILLARY: Glucose-Capillary: 97 mg/dL (ref 70–99)

## 2018-06-22 LAB — HEMOGLOBIN A1C
Hgb A1c MFr Bld: 5.7 % — ABNORMAL HIGH (ref 4.8–5.6)
Mean Plasma Glucose: 116.89 mg/dL

## 2018-06-22 MED ORDER — CLEVIDIPINE BUTYRATE 0.5 MG/ML IV EMUL
0.0000 mg/h | INTRAVENOUS | Status: DC
  Administered 2018-06-22: 1 mg/h via INTRAVENOUS
  Administered 2018-06-23: 4 mg/h via INTRAVENOUS
  Administered 2018-06-23 – 2018-06-24 (×3): 2 mg/h via INTRAVENOUS
  Administered 2018-06-24: 3 mg/h via INTRAVENOUS
  Administered 2018-06-24: 4 mg/h via INTRAVENOUS
  Filled 2018-06-22 (×7): qty 50

## 2018-06-22 MED ORDER — CLOPIDOGREL BISULFATE 75 MG PO TABS
75.0000 mg | ORAL_TABLET | Freq: Every day | ORAL | Status: DC
Start: 2018-06-22 — End: 2018-06-22

## 2018-06-22 MED ORDER — ASPIRIN 600 MG RE SUPP
300.0000 mg | Freq: Every day | RECTAL | Status: DC
  Administered 2018-06-22 – 2018-06-24 (×3): 300 mg via RECTAL
  Filled 2018-06-22 (×3): qty 1

## 2018-06-22 NOTE — Evaluation (Signed)
Clinical/Bedside Swallow Evaluation Patient Details  Name: Dawn [name] MRN: [id number] Date of Birth: Oct 12, 1928  Today's Date: 06/22/2018 Time: SLP Start Time (ACUTE ONLY): 21 SLP Stop Time (ACUTE ONLY): 0940 SLP Time Calculation (min) (ACUTE ONLY): 20 min  Past Medical History:  Past Medical History:  Diagnosis Date  . Cancer ([place])    skin cancer  . 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 Hypertension   . Sciatica    Past Surgical History:  Past Surgical History:  Procedure Laterality Date  . ABDOMINAL HYSTERECTOMY    . APPENDECTOMY    . CATARACT EXTRACTION, BILATERAL    . CHOLECYSTECTOMY    . ENDARTERECTOMY FEMORAL Right 04/19/2017   Procedure: ENDARTERECTOMY COMMON FEMORAL ARTERY, PROFUNDA  FEMORIS ARTERY, and  SUPERFICIAL FEMORAL ARTERY;  Surgeon: Algernon Huxley, MD;  Location: [facility];  Service: Vascular;  Laterality: Right;  . ENDARTERECTOMY FEMORAL Left 11/14/2017   Procedure: ENDARTERECTOMY FEMORAL;  Surgeon: Algernon Huxley, MD;  Location: [facility] OR[facility];  Service: Vascular;  Laterality: Left;  . HIP SURGERY Bilateral   . LOWER EXTREMITY ANGIOGRAM Right 04/19/2017   Procedure: LOWER EXTREMITY ANGIOGRAM WITH SUPERFICIAL FEMORAL ARTERY STENT PLACEMENT;  Surgeon: Algernon Huxley, MD;  Location: [facility];  Service: Vascular;  Laterality: Right;  . LOWER EXTREMITY ANGIOGRAPHY Right 04/18/2017   Procedure: Lower Extremity Angiography;  Surgeon: Algernon Huxley, MD;  Location: [place] [facility];  Service: Cardiovascular;  Laterality: Right;  . LOWER EXTREMITY ANGIOGRAPHY Left 07/12/2017   Procedure: Lower Extremity Angiography;  Surgeon: Algernon Huxley, MD;  Location: [place] [facility];  Service: Cardiovascular;  Laterality: Left;  . LOWER EXTREMITY ANGIOGRAPHY Left 11/09/2017   Procedure: LOWER EXTREMITY ANGIOGRAPHY;  Surgeon: Algernon Huxley, MD;  Location: [place] [facility];  Service: Cardiovascular;  Laterality: Left;  .  LOWER EXTREMITY INTERVENTION  07/12/2017   Procedure: Lower Extremity Intervention;  Surgeon: Algernon Huxley, MD;  Location: [place] [facility];  Service: Cardiovascular;;  . LOWER EXTREMITY INTERVENTION  11/09/2017   Procedure: LOWER EXTREMITY INTERVENTION;  Surgeon: Algernon Huxley, MD;  Location: [place] [facility];  Service: Cardiovascular;;  . RENAL ANGIOGRAPHY N/A 06/21/2017   Procedure: Renal Angiography;  Surgeon: Algernon Huxley, MD;  Location: [place] [facility];  Service: Cardiovascular;  Laterality: N/A;  . SHOULDER SURGERY Bilateral   . TOTAL HIP ARTHROPLASTY Bilateral    HPI:  Patient is an 84 y.o. female with PMH: HTN, cancer, hip arthroplasty who presesnted to [facility] ER with sudden onset left-sided weakness while eating breakfast. At baseline, patient walks short distances with walker but is mostly wheelchair bound. She is able to feed herself but needs help with bathing and toileting. CT Head positive for non-hemorrhagic right MCA territory infarct.   Assessment / Plan / Recommendation Clinical Impression  Patient presents with a severe oropharyngeal dysphagia and is currently unable to manage secretions and placing her at very high aspiration risk. Patient demonstrated delays in lingual movement with manipulation of small ice chip, and was not able to move tongue from right to left when she attempted to move ice chip in mouth. Patient is not safe for any PO's at this time. SLP Visit Diagnosis: Dysphagia, oropharyngeal phase (R13.12)    Aspiration Risk  Severe aspiration risk    Diet Recommendation NPO   Medication Administration: Via alternative means    Other  Recommendations Oral Care Recommendations: Oral  care QID   Follow up Recommendations Skilled Nursing facility      Frequency and Duration min 3x week  2 weeks       Prognosis        Swallow Study   General Date of Onset: 06/21/18 HPI: Patient is an 84 y.o. female with PMH: HTN, cancer, hip  arthroplasty who presesnted to [facility] ER with sudden onset left-sided weakness while eating breakfast. At baseline, patient walks short distances with walker but is mostly wheelchair bound. She is able to feed herself but needs help with bathing and toileting. CT Head positive for non-hemorrhagic right MCA territory infarct. Type of Study: Bedside Swallow Evaluation Previous Swallow Assessment: N/A Diet Prior to this Study: NPO Temperature Spikes Noted: No Respiratory Status: Room air History of Recent Intubation: No Behavior/Cognition: Alert;Cooperative;Pleasant mood Oral Cavity Assessment: Dried secretions Oral Care Completed by SLP: Yes Oral Cavity - Dentition: Dentures, bottom;Missing dentition Vision: Impaired for self-feeding Self-Feeding Abilities: Total assist Patient Positioning: Upright in bed Baseline Vocal Quality: Wet;Low vocal intensity Volitional Cough: Weak Volitional Swallow: Unable to elicit    Oral/Motor/Sensory Function Overall Oral Motor/Sensory Function: Severe impairment Facial ROM: Reduced left Facial Symmetry: Abnormal symmetry left Facial Strength: Reduced left Lingual ROM: Reduced left Lingual Strength: Reduced Velum: Within Functional Limits Mandible: Within Functional Limits   Ice Chips Ice chips: Impaired Oral Phase Impairments: Reduced lingual movement/coordination Pharyngeal Phase Impairments: Cough - Delayed;Throat Clearing - Delayed;Suspected delayed Swallow Other Comments: Patient attempted but unable to manipulate ice chip with tongue to left side of mouth.   Thin Liquid Thin Liquid: Not tested    Nectar Thick Nectar Thick Liquid: Not tested   Honey Thick Honey Thick Liquid: Not tested   Puree Puree: Not tested   Solid     Solid: Not tested     Sonia Baller, MA, CCC-SLP 06/22/18 5:23 PM

## 2018-06-22 NOTE — Progress Notes (Signed)
STROKE TEAM PROGRESS NOTE   HISTORY OF PRESENT ILLNESS (per record) Dawn [name] is an 84 y.o. female with past medical history of hypertension, cancer, hip arthroplasty who presented to [facility] emergency room with sudden onset left-sided weakness while eating breakfast. Her last known normal  Was 10:15 in the morning, symptoms witnessed by family.  Patient had an initial NIH stroke scale of 18 and received IV TPA at [facility].  CT angiogram shows occluded right M2 MCA branch and CT perfusion showed large score of 91 mL, therefore not a candidate for mechanical thrombectomy.  Transferred to [facility] neuro ICU for further evaluation management.  Patient's baseline she walks with a walker for short distances but mostly wheelchair-bound.  Feeds herself.  Needs help taking baths, going to the bathroom.  Date last known well: 7.26.19 Time last known well: 10.15 a.m. tPA Given: Yes NIHSS: 18 Baseline MRS 4     SUBJECTIVE (INTERVAL HISTORY) No major change.  On Cleviprex gtt and Labetalol prn.  CT post IV tpA pending this am.  LDL 42, TG 67, HgA1c5.7 She says she has metal in her body so holding off on MRI, but cannot verify where the metal is .  No pacemaker.    OBJECTIVE Temp:  [96.5 F (35.8 C)-98.9 F (37.2 C)] 98.5 F (36.9 C) (07/27 0803) Pulse Rate:  [70-150] 73 (07/27 0731) Cardiac Rhythm: Normal sinus rhythm (07/26 2200) Resp:  [13-28] 17 (07/27 0731) BP: (127-218)/(55-133) 161/74 (07/27 0731) SpO2:  [90 %-100 %] 100 % (07/27 0731) Weight:  [98 lb (44.5 kg)-111 lb 8.8 oz (50.6 kg)] 111 lb 8.8 oz (50.6 kg) (07/26 1445)  CBC:  Recent Labs  Lab 06/21/18 1102  WBC 8.6  NEUTROABS 7.2*  HGB 11.8*  HCT 35.6  MCV 87.0  PLT 588    Basic Metabolic Panel:  Recent Labs  Lab 06/21/18 1102  NA 138  K 4.1  CL 99  CO2 28  GLUCOSE 185*  BUN 31*  CREATININE 1.10*  CALCIUM 9.5    Lipid Panel:     Component Value Date/Time    CHOL 103 06/22/2018 0151   TRIG 67 06/22/2018 0151   HDL 48 06/22/2018 0151   CHOLHDL 2.1 06/22/2018 0151   VLDL 13 06/22/2018 0151   LDLCALC 42 06/22/2018 0151   HgbA1c:  Lab Results  Component Value Date   HGBA1C 5.7 (H) 06/22/2018   Urine Drug Screen:     Component Value Date/Time   LABOPIA NONE DETECTED 01/22/2018 1700   COCAINSCRNUR NONE DETECTED 01/22/2018 1700   LABBENZ NONE DETECTED 01/22/2018 1700   AMPHETMU NONE DETECTED 01/22/2018 1700   THCU NONE DETECTED 01/22/2018 1700   LABBARB NONE DETECTED 01/22/2018 1700    Alcohol Level     Component Value Date/Time   ETH <10 01/22/2018 2013    IMAGING   Ct Angio Head W Or Wo Contrast Ct Angio Neck W Or Wo Contrast Ct Cerebral Perfusion W Contrast  06/21/2018 IMPRESSION:  1. Acute infarct of the right frontal lobe. Core infarct volume is 91 mL.  2. Occluded right M2 branch at the MCA bifurcation without distal reconstitution or collateral vessels. This corresponds to the infarct territory  3. Mild narrowing of the left M1 segment as described.  4. Diffuse distal small vessel disease without other significant proximal stenosis or occlusion in the [place].  5. High-grade stenosis of the left internal carotid artery at the bifurcation.  6. Atherosclerotic changes involving the  right carotid bifurcation without significant stenosis.  7. Extensive atherosclerotic disease at the aortic arch without significant stenosis.  8. 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 It is reconstituted at C1-2 level.  9. Multilevel degenerative changes in the cervical spine     Ct Head Code Stroke Wo Contrast 06/21/2018 IMPRESSION:  1. No acute intracranial abnormality.  2. ASPECTS is 10/10  3. Moderate atrophy and white matter disease is stable bilaterally.     Transthoracic Echocardiogram - pending 00/00/00        PHYSICAL EXAM Vitals:   06/22/18 0646 06/22/18 0700 06/22/18 0731 06/22/18 0803  BP: (!) 174/89 (!)  189/89 (!) 161/74   Pulse: 77 79 73   Resp: (!) 25 (!) 28 17   Temp:    98.5 F (36.9 C)  TempSrc:    Axillary  SpO2: 99% 99% 100%   Weight:      Height:        Await, alert.  Disoriented. Right gaze preference. LUE and LLE 0-1/5 RUE and RLE 4/5. Grimaces to pain on the left. Some withdrawal on the right. Left babinski.       ASSESSMENT/PLAN Ms. Dawn [name] is a 84 y.o. female with history of ASPVD, Cancer, and Htn  presenting with left sided weakness.  The patient received IV TPA at [facility] Friday AM 06/21/2018.  Stroke:   right frontal lobe infarct - embolic - unknown etiology.  Resultant  Left side weakness  CT head - No acute intracranial abnormality.   CTA H&N - Occluded right M2 branch - High-grade stenosis of the left internal carotid - Acute infarct of the right frontal lobe.   MRI head - not performed  MRA head - not performed  Carotid Doppler - CTA neck performed or pending - carotid dopplers not indicated.  2D Echo - pending  LDL 42  HgbA1c - 5.7  VTE prophylaxis - SCDs Diet Order           Diet NPO time specified  Diet effective now           aspirin 81 mg daily and clopidogrel 75 mg daily prior to admission, now on No antithrombotic S/P TPA  Patient will be counseled to be compliant with her antithrombotic medications  Ongoing aggressive stroke risk factor management  Therapy recommendations:  pending  Disposition:  Pending  Hypertension  Unstable -> Cleviprex drip and prn Labetalol. (Clonidine PTA) . SBP < 180 S/P TPA . Long-term BP goal normotensive  Hyperlipidemia  Lipid lowering medication PTA:  none  LDL 42, goal < 70  Current lipid lowering medication: none  Continue statin at discharge   Other Stroke Risk Factors  Advanced age  Former cigarette smoker - quit   Other Active Problems  Renal insufficiency - BUN 31 ; Creatinine 1.10 -> hydrate -> monitor      Hospital day # 1  Right  MCA distribution ischemic infarct with right M2 stenosis s/p IV tPA.  Awaiting CT repeat.  BP well controlled on low dose Cleviprex.  Lipids are well controlled.  Not diabetic or smoker.  Awaiting TTE.   If CT is negative for hemorrhage, will start Plavix 75 mg qd.    To contact Stroke Continuity provider, please refer to [email or web address] After hours, contact General Neurology

## 2018-06-22 NOTE — Progress Notes (Signed)
PT Cancellation Note  Patient Details Name: Dawn [name] MRN: [id number] DOB: [birth year]   Cancelled Treatment:    Reason Eval/Treat Not Completed: Active bedrest order   Duncan Dull 06/22/2018, 6:18 AM

## 2018-06-22 NOTE — Progress Notes (Signed)
  Echocardiogram 2D Echocardiogram has been performed.  Dawn [name] 06/22/2018, 12:52 PM

## 2018-06-22 NOTE — Evaluation (Signed)
Speech Language Pathology Evaluation Patient Details Name: Dawn [name] MRN: [id number] DOB: 1928-02-17 Today's Date: 06/22/2018 Time: 5638-7564 SLP Time Calculation (min) (ACUTE ONLY): 15 min  Problem List:  Patient Active Problem List   Diagnosis Date Noted  . Acute right arterial ischemic stroke, middle cerebral artery (MCA) ([place]) 06/21/2018  . Symptomatic anemia 03/07/2018  . TIA (transient ischemic attack) 01/22/2018  . Pressure injury of skin 01/04/2018  . Sepsis ([place]) 01/03/2018  . Protein-calorie malnutrition, severe 11/14/2017  . Atherosclerosis of native arteries of extremity with rest pain ([place]) 11/09/2017  . Ischemic leg 11/09/2017  . Atherosclerotic peripheral vascular disease with rest pain ([place]) 11/09/2017  . Atypical chest pain 09/21/2017  . Renal artery stenosis ([place]) 06/15/2017  . Hyperlipidemia 05/09/2017  . Essential hypertension 10/17/2016  . Carotid stenosis 10/17/2016  . PVD (peripheral vascular disease) ([place]) 10/17/2016  . B12 deficiency 07/19/2016  . Vitamin D deficiency, unspecified 07/19/2016  . History of right hip replacement 11/04/2015  . Aftercare following bilateral hip joint replacement surgery 11/05/2014  . SCC (squamous cell carcinoma) 05/21/2014  . COPD with asthma ([place]) 03/16/2014  . S/P hip replacement 10/03/2012   Past Medical History:  Past Medical History:  Diagnosis Date  . Cancer ([place])    skin cancer  . 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 Hypertension   . Sciatica    Past Surgical History:  Past Surgical History:  Procedure Laterality Date  . ABDOMINAL HYSTERECTOMY    . APPENDECTOMY    . CATARACT EXTRACTION, BILATERAL    . CHOLECYSTECTOMY    . ENDARTERECTOMY FEMORAL Right 04/19/2017   Procedure: ENDARTERECTOMY COMMON FEMORAL ARTERY, PROFUNDA  FEMORIS ARTERY, and  SUPERFICIAL FEMORAL ARTERY;  Surgeon: Algernon Huxley, MD;  Location: [facility] OR[facility];  Service: Vascular;   Laterality: Right;  . ENDARTERECTOMY FEMORAL Left 11/14/2017   Procedure: ENDARTERECTOMY FEMORAL;  Surgeon: Algernon Huxley, MD;  Location: [facility] ORS;  Service: Vascular;  Laterality: Left;  . HIP SURGERY Bilateral   . LOWER EXTREMITY ANGIOGRAM Right 04/19/2017   Procedure: LOWER EXTREMITY ANGIOGRAM WITH SUPERFICIAL FEMORAL ARTERY STENT PLACEMENT;  Surgeon: Algernon Huxley, MD;  Location: [facility];  Service: Vascular;  Laterality: Right;  . LOWER EXTREMITY ANGIOGRAPHY Right 04/18/2017   Procedure: Lower Extremity Angiography;  Surgeon: Algernon Huxley, MD;  Location: [place] [facility];  Service: Cardiovascular;  Laterality: Right;  . LOWER EXTREMITY ANGIOGRAPHY Left 07/12/2017   Procedure: Lower Extremity Angiography;  Surgeon: Algernon Huxley, MD;  Location: [place] [facility];  Service: Cardiovascular;  Laterality: Left;  . LOWER EXTREMITY ANGIOGRAPHY Left 11/09/2017   Procedure: LOWER EXTREMITY ANGIOGRAPHY;  Surgeon: Algernon Huxley, MD;  Location: [place] [facility];  Service: Cardiovascular;  Laterality: Left;  . LOWER EXTREMITY INTERVENTION  07/12/2017   Procedure: Lower Extremity Intervention;  Surgeon: Algernon Huxley, MD;  Location: [place] [facility];  Service: Cardiovascular;;  . LOWER EXTREMITY INTERVENTION  11/09/2017   Procedure: LOWER EXTREMITY INTERVENTION;  Surgeon: Algernon Huxley, MD;  Location: [place] [facility];  Service: Cardiovascular;;  . RENAL ANGIOGRAPHY N/A 06/21/2017   Procedure: Renal Angiography;  Surgeon: Algernon Huxley, MD;  Location: [place] [facility];  Service: Cardiovascular;  Laterality: N/A;  . SHOULDER SURGERY Bilateral   . TOTAL HIP ARTHROPLASTY Bilateral    HPI:  Patient is an 84 y.o. female with PMH: HTN, cancer, hip arthroplasty who preses[facility] to [facility]  ER with sudden onset left-sided weakness while eating breakfast. At baseline, patient walks short distances with walker but is mostly wheelchair bound. She is able to feed herself but needs help with  bathing and toileting. CT Head positive for non-hemorrhagic right MCA territory infarct.   Assessment / Plan / Recommendation Clinical Impression  Patient presents with a mild cognitive-linguisitic impairment and severe dysarthria. Cognitive impairment consists of disorientation to time, difficulty with recalling new information, and decreased awareness to deficits, with left visual field inattention. Patient's dysarthria significantly impacts ability of even trained listener to understand what she is saying and speech intelligibility decreased even more when patient had saliva buildup which she was not able to expectorate.     SLP Assessment  SLP Visit Diagnosis: Cognitive communication deficit (R41.841);Dysarthria and anarthria (R47.1)    Follow Up Recommendations  Skilled Nursing facility    Frequency and Duration min 3x week         SLP Evaluation Cognition  Overall Cognitive Status: Impaired/Different from baseline Orientation Level: Oriented to person;Oriented to place;Oriented to situation;Disoriented to time Attention: Selective Selective Attention: Impaired Selective Attention Impairment: Verbal complex;Functional basic Memory: Impaired Memory Impairment: Decreased recall of new information Awareness: Impaired Awareness Impairment: Emergent impairment       Comprehension  Auditory Comprehension Overall Auditory Comprehension: Appears within functional limits for tasks assessed    Expression Expression Primary Mode of Expression: Verbal Verbal Expression Overall Verbal Expression: Other (comment)(appears appropriate but difficult to fully assess secondary to severe dysarthria) Interfering Components: Speech intelligibility   Oral / Motor  Oral Motor/Sensory Function Overall Oral Motor/Sensory Function: Severe impairment Facial ROM: Reduced left Facial Symmetry: Abnormal symmetry left Facial Strength: Reduced left Lingual ROM: Reduced left Lingual Strength:  Reduced Velum: Within Functional Limits Mandible: Within Functional Limits Motor Speech Overall Motor Speech: Impaired Respiration: Within functional limits Phonation: Low vocal intensity Resonance: Within functional limits Articulation: Impaired Level of Impairment: Phrase Intelligibility: Intelligibility reduced Word: 25-49% accurate Phrase: 0-24% accurate Sentence: Not tested Conversation: Not tested Motor Planning: Witnin functional limits   [place], [place], CCC-SLP 06/22/18 5:31 PM

## 2018-06-23 DIAGNOSIS — I4891 Unspecified atrial fibrillation: Secondary | ICD-10-CM

## 2018-06-23 LAB — BASIC METABOLIC PANEL
Anion gap: 12 (ref 5–15)
BUN: 24 mg/dL — ABNORMAL HIGH (ref 8–23)
CALCIUM: 9 mg/dL (ref 8.9–10.3)
CO2: 24 mmol/L (ref 22–32)
Chloride: 106 mmol/L (ref 98–111)
Creatinine, Ser: 1.04 mg/dL — ABNORMAL HIGH (ref 0.44–1.00)
GFR calc Af Amer: 54 mL/min — ABNORMAL LOW (ref >60)
GFR calc non Af Amer: 47 mL/min — ABNORMAL LOW (ref >60)
Glucose, Bld: 87 mg/dL (ref 70–99)
Potassium: 3.2 mmol/L — ABNORMAL LOW (ref 3.5–5.1)
Sodium: 142 mmol/L (ref 135–145)

## 2018-06-23 MED ORDER — SODIUM CHLORIDE 0.9 % IV SOLN
INTRAVENOUS | Status: DC
  Administered 2018-06-23 – 2018-06-25 (×3): via INTRAVENOUS

## 2018-06-23 MED ORDER — WHITE PETROLATUM EX OINT
TOPICAL_OINTMENT | CUTANEOUS | Status: AC
  Administered 2018-06-23: 0.2
  Filled 2018-06-23: qty 28.35

## 2018-06-23 MED ORDER — POTASSIUM CHLORIDE 10 MEQ/100ML IV SOLN
10.0000 meq | INTRAVENOUS | Status: AC
  Administered 2018-06-23 (×3): 10 meq via INTRAVENOUS
  Filled 2018-06-23 (×3): qty 100

## 2018-06-23 NOTE — Progress Notes (Signed)
Reportedly in aflutter with rates in the 80s. Asymptomatic. Will get EKG, stroke is large enough that I would not pursue anticoagulation at this time.   If ventricular rate increases may change cleviprex to dilt.   Roland Rack, MD [facility] [phone]  If 7pm- 7am, please page neurology on call as listed in [place].

## 2018-06-23 NOTE — Evaluation (Signed)
Occupational Therapy Evaluation Patient Details Name: Dawn [name] MRN: [id number] DOB: 1928-09-25 Today's Date: 06/23/2018    History of Present Illness 84 y.o. female with PMH: HTN, cancer, hip arthroplasty who presesnted to [facility] ER with sudden onset left-sided weakness. CT Head positive for non-hemorrhagic right MCA territory infarct.   Clinical Impression   PTA Pt mod A for bathing/dressing. Close supervision with RW for in home mobility- and using wc for community mobility. Pt had recent stay at SNF due to vascular sx and then had transitioned to [facility] therapies/RN/Aide each of which were coming out 1x a week and then son has paid caregiver in afternoon/evening when he works. Pt is currently max to total A for all ADL/transfers. Pt was able to wash her face with washcloth and attended to the left side. Vision is impaired and will need further assessment - currently does track past midline with extensive cues and disconjugate gaze present - denies double vision. Hemiplegia on the left side - although during PROM bedlevel, LUE did get resistance to movement when going into extension. OT will continue to follow acutely - at this time recommending SNF level therapy post-acute to maximize safety and independence in ADL and functional transfers.    Follow Up Recommendations  SNF;Supervision/Assistance - 24 hour    Equipment Recommendations  None recommended by OT    Recommendations for Other Services Other (comment)(Palliative Medicine)     Precautions / Restrictions Precautions Precautions: Fall Restrictions Weight Bearing Restrictions: No      Mobility Bed Mobility Overal bed mobility: Needs Assistance Bed Mobility: Supine to Sit;Sit to Supine     Supine to sit: Max assist;+2 for physical assistance Sit to supine: Max assist;+2 for physical assistance   General bed mobility comments: +2 max assist to come to EOB, patient was able to move RLE to EOB with cues  and required assist for LLE and elevation of trunk to upright. Max assist to return to supine and reposition in bed  Transfers                 General transfer comment: did not attempt at this time (patient with lightheaded "woozy" and diaphoretic at EOB)    Balance Overall balance assessment: Needs assistance Sitting-balance support: Feet unsupported Sitting balance-Leahy Scale: Poor Sitting balance - Comments: requires assist to maintain upright at EOB, tolerate ~6 minutes at EOB, BP 440H systolic but patient diaphoretic and woozy                                   ADL either performed or assessed with clinical judgement   ADL Overall ADL's : Needs assistance/impaired Eating/Feeding: NPO   Grooming: Moderate assistance;Bed level;Wash/dry face Grooming Details (indicate cue type and reason): did attend to left side of face whith wash cloth Upper Body Bathing: Maximal assistance;Bed level   Lower Body Bathing: Total assistance;Bed level   Upper Body Dressing : Total assistance;Bed level   Lower Body Dressing: Total assistance   Toilet Transfer: Total assistance   Toileting- Clothing Manipulation and Hygiene: Total assistance       Functional mobility during ADLs: (NT) General ADL Comments: Pt able to use RUE to wash face - attending to left side of face at bed level     Vision   Vision Assessment?: Vision impaired- to be further tested in functional context Additional Comments: disconjugate gaze, did track past midline on at least  2 occasions.      Perception     Praxis      Pertinent Vitals/Pain Pain Assessment: Faces Faces Pain Scale: Hurts a little bit Pain Location: head  Pain Descriptors / Indicators: Headache Pain Intervention(s): Monitored during session;Repositioned     Hand Dominance Right   Extremity/Trunk Assessment Upper Extremity Assessment Upper Extremity Assessment: LUE deficits/detail;Generalized weakness LUE Deficits /  Details: trace movements with gross PROM at biceps, no grasp, no pain with ROM, Pt reports that sensation is intact LUE Coordination: decreased fine motor;decreased gross motor(absent in fine motor)   Lower Extremity Assessment Lower Extremity Assessment: Defer to PT evaluation RLE Coordination: decreased fine motor;decreased gross motor LLE Deficits / Details: no active movement noted, flaccid LLE LLE Sensation: (absent) LLE Coordination: (absent)   Cervical / Trunk Assessment Cervical / Trunk Assessment: Kyphotic   Communication Communication Communication: Expressive difficulties   Cognition Arousal/Alertness: Awake/alert   Overall Cognitive Status: Impaired/Different from baseline Area of Impairment: Following commands;Awareness;Safety/judgement;Problem solving;Attention                   Current Attention Level: Focused   Following Commands: Follows one step commands with increased time;Follows one step commands consistently Safety/Judgement: Decreased awareness of safety;Decreased awareness of deficits Awareness: Intellectual Problem Solving: Slow processing;Requires verbal cues;Requires tactile cues     General Comments  Pt's son present throughout session.    Exercises     Shoulder Instructions      Home Living Family/patient expects to be discharged to:: Private residence Living Arrangements: Children Available Help at Discharge: Family;Available 24 hours/day Type of Home: House Home Access: Stairs to enter;Ramped entrance Entrance Stairs-Number of Steps: 1 Entrance Stairs-Rails: None Home Layout: One level     Bathroom Shower/Tub: Teacher, early years/pre: Handicapped height     Home Equipment: Environmental consultant - 2 wheels;Cane - single point;Bedside commode;Tub bench;Grab bars - toilet;Grab bars - tub/shower;Wheelchair - Banker          Prior Functioning/Environment Level of Independence: Needs assistance  Gait / Transfers  Assistance Needed: Assistance required with gait using a rolling walker but overall using a wheel chair for community ADL's / Homemaking Assistance Needed: Requiring assist with ADLs/IADLs at SNF   Comments: aide, PT/OT coming out 1x a week with paid help in afternoon/evening while son is at work who also assists with ADL.         OT Problem List: Decreased strength;Decreased range of motion;Decreased activity tolerance;Impaired balance (sitting and/or standing);Impaired vision/perception;Decreased coordination;Decreased cognition;Impaired sensation;Impaired UE functional use      OT Treatment/Interventions: Self-care/ADL training;Therapeutic exercise;Neuromuscular education;Manual therapy;Therapeutic activities;Patient/family education;Balance training;Visual/perceptual remediation/compensation;Cognitive remediation/compensation    OT Goals(Current goals can be found in the care plan section) Acute Rehab OT Goals Patient Stated Goal: to get better OT Goal Formulation: With patient/family Time For Goal Achievement: 07/07/18 Potential to Achieve Goals: Fair ADL Goals Pt Will Perform Grooming: with min assist;sitting Pt Will Perform Upper Body Bathing: with mod assist;sitting Pt Will Transfer to Toilet: with max assist;squat pivot transfer;bedside commode Additional ADL Goal #1: Pt will perform bed mobility at mod A level prior to engaging in ADL activity Additional ADL Goal #2: Pt will track past midline with auditory cues 3/4 trials.  OT Frequency: Min 2X/week   Barriers to D/C:            Co-evaluation PT/OT/SLP Co-Evaluation/Treatment: Yes Reason for Co-Treatment: Complexity of the patient's impairments (multi-system involvement);Necessary to address cognition/behavior during functional activity;For patient/therapist safety;To address functional/ADL transfers  PT goals addressed during session: Mobility/safety with mobility;Balance OT goals addressed during session: ADL's and  self-care      AM-PAC PT "6 Clicks" Daily Activity     Outcome Measure Help from another person eating meals?: Total Help from another person taking care of personal grooming?: A Lot Help from another person toileting, which includes using toliet, bedpan, or urinal?: Total Help from another person bathing (including washing, rinsing, drying)?: Total Help from another person to put on and taking off regular upper body clothing?: Total Help from another person to put on and taking off regular lower body clothing?: Total 6 Click Score: 7   End of Session Nurse Communication: Mobility status;Other (comment)(diaphoretic and woozy at end of session after sitting EOB)  Activity Tolerance: Patient tolerated treatment well Patient left: in bed;with call bell/phone within reach;with bed alarm set;with family/visitor present;with SCD's reapplied  OT Visit Diagnosis: Unsteadiness on feet (R26.81);Other abnormalities of gait and mobility (R26.89);Muscle weakness (generalized) (M62.81);Other symptoms and signs involving the nervous system (R29.898);Hemiplegia and hemiparesis Hemiplegia - Right/Left: Left Hemiplegia - dominant/non-dominant: Non-Dominant Hemiplegia - caused by: Cerebral infarction                Time: 0936-1010 OT Time Calculation (min): 34 min Charges:  OT General Charges $OT Visit: 1 Visit OT Evaluation $OT Eval Moderate Complexity: 1 Mod  Hulda Humphrey OTR/L [place] 06/23/2018, 12:04 PM

## 2018-06-23 NOTE — Evaluation (Signed)
Physical Therapy Evaluation Patient Details Name: Dawn [name] MRN: [id number] DOB: [birth year] Today's Date: 06/23/2018   History of Present Illness  84 y.o. female with PMH: HTN, cancer, hip arthroplasty who presesnted to [facility] ER with sudden onset left-sided weakness. CT Head positive for non-hemorrhagic right MCA territory infarct.  Clinical Impression  Orders received for PT evaluation. Patient demonstrates deficits in functional mobility as indicated below. Will benefit from continued skilled PT to address deficits and maximize function. Will see as indicated and progress as tolerated.  At this time, patient is able to follow commands with right side and respond to questions although dysarthric speech is difficult to understand. Patient with significantly wet audible breaths with inability to manage secretions (saturating 93% on room air but unable to cough enough to clear). Patient did become diaphoretic, light headed and endorses onset of headache in sitting position. BP 170/73. Nsg aware. At this time, anticipate patient will need extensive rehabilitation, recommend SNF unless patient is able to progress activity tolerance (then may consider CIR).     Follow Up Recommendations SNF;Supervision/Assistance - 24 hour(if progressing (may consider CIR for decr BOC))    Equipment Recommendations  None recommended by PT    Recommendations for Other Services       Precautions / Restrictions Precautions Precautions: Fall Restrictions Weight Bearing Restrictions: No      Mobility  Bed Mobility Overal bed mobility: Needs Assistance Bed Mobility: Supine to Sit;Sit to Supine     Supine to sit: Max assist;+2 for physical assistance Sit to supine: Max assist;+2 for physical assistance   General bed mobility comments: +2 max assist to come to EOB, patient was able to move RLE to EOB with cues and required assist for LLE and elevation of trunk to upright. Max assist  to return to supine and reposition in bed  Transfers                 General transfer comment: did not attempt at this time (patient with lightheaded "woozy" and diaphoretic at EOB)  Ambulation/Gait                Stairs            Wheelchair Mobility    Modified Rankin (Stroke Patients Only) Modified Rankin (Stroke Patients Only) Pre-Morbid Rankin Score: Moderately severe disability Modified Rankin: Severe disability     Balance Overall balance assessment: Needs assistance Sitting-balance support: Feet unsupported Sitting balance-Leahy Scale: Poor Sitting balance - Comments: requires assist to maintain upright at EOB, tolerate ~6 minutes at EOB, BP 630Z systolic but patient diaphoretic and woozy                                     Pertinent Vitals/Pain Pain Assessment: Faces Pain Location: head  Pain Descriptors / Indicators: Headache Pain Intervention(s): Monitored during session    Home Living Family/patient expects to be discharged to:: Private residence Living Arrangements: Children Available Help at Discharge: Family;Available 24 hours/day Type of Home: House Home Access: Stairs to enter;Ramped entrance Entrance Stairs-Rails: None Entrance Stairs-Number of Steps: 1 Home Layout: One level Home Equipment: Walker - 2 wheels;Cane - single point;Bedside commode;Tub bench;Grab bars - toilet;Grab bars - tub/shower;Wheelchair - Banker      Prior Function Level of Independence: Needs assistance   Gait / Transfers Assistance Needed: Assistance required with gait using a rolling walker but overall using a wheel  chair for community  ADL's / Homemaking Assistance Needed: Requiring assist with ADLs/IADLs at SNF  Comments: aide recently coming out in the evenings      Hand Dominance   Dominant Hand: Right    Extremity/Trunk Assessment   Upper Extremity Assessment Upper Extremity Assessment: Defer to OT  evaluation;Generalized weakness;LUE deficits/detail    Lower Extremity Assessment Lower Extremity Assessment: Generalized weakness;RLE deficits/detail;LLE deficits/detail RLE Coordination: decreased fine motor;decreased gross motor LLE Deficits / Details: no active movement noted, flaccid LLE LLE Sensation: (absent) LLE Coordination: (absent)    Cervical / Trunk Assessment Cervical / Trunk Assessment: Kyphotic  Communication   Communication: Expressive difficulties  Cognition Arousal/Alertness: Awake/alert   Overall Cognitive Status: Impaired/Different from baseline Area of Impairment: Following commands;Awareness;Safety/judgement;Problem solving;Attention                   Current Attention Level: Focused   Following Commands: Follows one step commands with increased time;Follows one step commands consistently Safety/Judgement: Decreased awareness of safety;Decreased awareness of deficits Awareness: Intellectual Problem Solving: Slow processing;Requires verbal cues;Requires tactile cues        General Comments      Exercises     Assessment/Plan    PT Assessment Patient needs continued PT services  PT Problem List         PT Treatment Interventions DME instruction;Gait training;Functional mobility training;Therapeutic activities;Therapeutic exercise;Balance training;Neuromuscular re-education;Cognitive remediation;Patient/family education    PT Goals (Current goals can be found in the Care Plan section)  Acute Rehab PT Goals Patient Stated Goal: to get better PT Goal Formulation: With family Time For Goal Achievement: 07/07/18 Potential to Achieve Goals: Fair    Frequency Min 3X/week   Barriers to discharge        Co-evaluation PT/OT/SLP Co-Evaluation/Treatment: Yes Reason for Co-Treatment: Complexity of the patient's impairments (multi-system involvement);Necessary to address cognition/behavior during functional activity;For patient/therapist  safety;To address functional/ADL transfers PT goals addressed during session: Mobility/safety with mobility;Balance OT goals addressed during session: ADL's and self-care       AM-PAC PT "6 Clicks" Daily Activity  Outcome Measure Difficulty turning over in bed (including adjusting bedclothes, sheets and blankets)?: Unable Difficulty moving from lying on back to sitting on the side of the bed? : Unable Difficulty sitting down on and standing up from a chair with arms (e.g., wheelchair, bedside commode, etc,.)?: Unable Help needed moving to and from a bed to chair (including a wheelchair)?: Total Help needed walking in hospital room?: Total Help needed climbing 3-5 steps with a railing? : Total 6 Click Score: 6    End of Session   Activity Tolerance: Treatment limited secondary to medical complications (Comment)(lightheaded, diaphoretic and increased headache in sitting) Patient left: in bed;with call bell/phone within reach;with family/visitor present(chair position) Nurse Communication: Mobility status PT Visit Diagnosis: Other symptoms and signs involving the nervous system (R29.898);Hemiplegia and hemiparesis Hemiplegia - Right/Left: Left Hemiplegia - dominant/non-dominant: Non-dominant    Time: 1829-9371 PT Time Calculation (min) (ACUTE ONLY): 30 min   Charges:   PT Evaluation $PT Eval Moderate Complexity: 1 Mod          Alben Deeds, PT DPT  Board Certified Neurologic Specialist [phone]   Duncan Dull 06/23/2018, 10:19 AM

## 2018-06-23 NOTE — Progress Notes (Signed)
Pt has gone into a-fib with heart rate in the 90's. Will continue to monitor.

## 2018-06-23 NOTE — Progress Notes (Signed)
Pt noted to be in a-flutter with rate in the 80's. Dr Leonel Ramsay notified, order obtained for EKG.

## 2018-06-23 NOTE — Progress Notes (Signed)
  Speech Language Pathology Treatment: Dysphagia  Patient Details Name: Dawn [name] MRN: [id number] DOB: 01-27-1928 Today's Date: 06/23/2018 Time: 5883-2549 SLP Time Calculation (min) (ACUTE ONLY): 23 min  Assessment / Plan / Recommendation Clinical Impression  Therapy concentrated on dysphagia specifically saliva management, cough reflex, lingual manipulation and education with pt's son Ronalee Belts. At baseline pt presents with significant gurgly quality during phonation and respiration. Oral hygiene removed mild amount of dried hard and soft palate mucous. Uvula was hypotonic approximating posterior tongue and obstructing [place] during oral suctioning indicative of CN X involvement. SLP facilitated mobilization of secretions with verbal/demonstration cues for hard/effortful coughs and throat clears, ice chips to thin secretions and elicit cough. Mild amount of secretions extracted however she is unable to move to oropharynx. Poor control with ice chip with significantly decreased lingual movement. Immediate cough with 1/2 teaspoon water, audible and multiple swallows.  Provided education to son re: severity of dysphagia and briefly discussed risks of longer term alternate feeding, current aspiration of oral/pharyngeal secretions not appropriate for instrumental assessment at present. He reported pt's improvements from yesterday and wants to "be positive" in terms of swallowing. Discussed Palliative care has been consulted, their benefit and what they have to offer. Continue ST for dysphagia, cognition and strategies to increase intelligibility.    HPI HPI: Patient is an 84 y.o. female with PMH: HTN, cancer, hip arthroplasty who presesnted to [facility] ER with sudden onset left-sided weakness while eating breakfast. At baseline, patient walks short distances with walker but is mostly wheelchair bound. She is able to feed herself but needs help with bathing and toileting. CT Head positive  for non-hemorrhagic right MCA territory infarct.      SLP Plan  Continue with current plan of care       Recommendations  Diet recommendations: NPO                Oral Care Recommendations: Oral care QID Follow up Recommendations: Skilled Nursing facility Plan: Continue with current plan of care       GO                Houston Siren 06/23/2018, 5:09 PM  Orbie Pyo Colvin Caroli.Ed [employer] [phone]

## 2018-06-23 NOTE — Progress Notes (Signed)
STROKE TEAM PROGRESS NOTE   HISTORY OF PRESENT ILLNESS (per record) Dawn [name] is an 84 y.o. female with past medical history of hypertension, cancer, hip arthroplasty who presented to [facility] emergency room with sudden onset left-sided weakness while eating breakfast. Her last known normal  Was 10:15 in the morning, symptoms witnessed by family.  Patient had an initial NIH stroke scale of 18 and received IV TPA at [facility].  CT angiogram shows occluded right M2 MCA branch and CT perfusion showed large score of 91 mL, therefore not a candidate for mechanical thrombectomy.  Transferred to [facility] neuro ICU for further evaluation management.  Patient's baseline she walks with a walker for short distances but mostly wheelchair-bound.  Feeds herself.  Needs help taking baths, going to the bathroom.  Date last known well: 7.26.19 Time last known well: 10.15 a.m. tPA Given: Yes NIHSS: 18 Baseline MRS 4     SUBJECTIVE (INTERVAL HISTORY) Episode of atrial fib/flutter last night.  ECG not clear since a lot of tremors, but appears to be a. Fib.    BP well controlled off Cleviprex.   LDL 42, TG 67, HgA1c5.7 She says she has metal in her body so holding off on MRI, but cannot verify where the metal is .  No pacemaker.  RN says she failed bedside swallow.  Family declines NG tube and PEG tube.    Repeat CT at 24 hours post-IV tPA showed no hemorrhage.  There is a large area of infarct with some edema.     OBJECTIVE Temp:  [98.4 F (36.9 C)-100 F (37.8 C)] 99.2 F (37.3 C) (07/28 0800) Pulse Rate:  [66-117] 104 (07/28 0900) Cardiac Rhythm: Sinus tachycardia (07/28 0800) Resp:  [13-34] 16 (07/28 0900) BP: (128-184)/(49-109) 173/70 (07/28 0900) SpO2:  [89 %-98 %] 96 % (07/28 0900)  CBC:  Recent Labs  Lab 06/21/18 1102  WBC 8.6  NEUTROABS 7.2*  HGB 11.8*  HCT 35.6  MCV 87.0  PLT 254    Basic Metabolic Panel:  Recent Labs  Lab  06/21/18 1102 06/23/18 0210  NA 138 142  K 4.1 3.2*  CL 99 106  CO2 28 24  GLUCOSE 185* 87  BUN 31* 24*  CREATININE 1.10* 1.04*  CALCIUM 9.5 9.0    Lipid Panel:     Component Value Date/Time   CHOL 103 06/22/2018 0151   TRIG 67 06/22/2018 0151   HDL 48 06/22/2018 0151   CHOLHDL 2.1 06/22/2018 0151   VLDL 13 06/22/2018 0151   LDLCALC 42 06/22/2018 0151   HgbA1c:  Lab Results  Component Value Date   HGBA1C 5.7 (H) 06/22/2018   Urine Drug Screen:     Component Value Date/Time   LABOPIA NONE DETECTED 01/22/2018 1700   COCAINSCRNUR NONE DETECTED 01/22/2018 1700   LABBENZ NONE DETECTED 01/22/2018 1700   AMPHETMU NONE DETECTED 01/22/2018 1700   THCU NONE DETECTED 01/22/2018 1700   LABBARB NONE DETECTED 01/22/2018 1700    Alcohol Level     Component Value Date/Time   ETH <10 01/22/2018 2013    IMAGING   Ct Angio Head W Or Wo Contrast Ct Angio Neck W Or Wo Contrast Ct Cerebral Perfusion W Contrast  06/21/2018 IMPRESSION:  1. Acute infarct of the right frontal lobe. Core infarct volume is 91 mL.  2. Occluded right M2 branch at the MCA bifurcation without distal reconstitution or collateral vessels. This corresponds to the infarct territory  3. Mild narrowing of  the left M1 segment as described.  4. Diffuse distal small vessel disease without other significant proximal stenosis or occlusion in the circle[place]of-Willis.  5. High-grade stenosis of the left internal carotid artery at the bifurcation.  6. Atherosclerotic changes involving the right carotid bifurcation without significant stenosis.  7. Extensive atherosclerotic disease at the aortic arch without significant stenosis.  8. 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 It is reconstituted at C1-2 level.  9. Multilevel degenerative changes in the cervical spine     Ct Head Code Stroke Wo Contrast 06/21/2018 IMPRESSION:  1. No acute intracranial abnormality.  2. ASPECTS is 10/10  3. Moderate atrophy and white matter  disease is stable bilaterally.     Transthoracic Echocardiogram  06/22/2018 Study Conclusions - Left ventricle: The cavity size was normal. Systolic function was   vigorous. The estimated ejection fraction was in the range of 65%   to 70%. Wall motion was normal; there were no regional wall   motion abnormalities. Features are consistent with a pseudonormal   left ventricular filling pattern, with concomitant abnormal   relaxation and increased filling pressure (grade 2 diastolic   dysfunction). Doppler parameters are consistent with high   ventricular filling pressure. - Aortic valve: Poorly visualized. Severely calcified annulus.   Trileaflet; moderately thickened, moderately calcified leaflets. - Mitral valve: Severely calcified annulus. Moderately thickened,   mildly calcified leaflets anterior. There was mild to moderate   regurgitation. - Atrial septum: There was increased thickness of the septum,   consistent with lipomatous hypertrophy. - Tricuspid valve: There was mild regurgitation. - Pulmonic valve: There was mild regurgitation.        PHYSICAL EXAM Vitals:   06/23/18 0815 06/23/18 0830 06/23/18 0845 06/23/18 0900  BP: (!) 172/68 (!) 153/66 (!) 158/88 (!) 173/70  Pulse: 95 95 66 (!) 104  Resp: 19 17 (!) 22 16  Temp:      TempSrc:      SpO2: 94% 96% 96% 96%  Weight:      Height:        Await, alert.  Nodds appropriately and jokes appropriately.  Right gaze preference. LUE and LLE 0-1/5 RUE and RLE 4/5. Grimaces to pain on the left. Some withdrawal on the right. Left babinski.       ASSESSMENT/PLAN Dawn [name] is a 84 y.o. female with history of ASPVD, Cancer, and Htn  presenting with left sided weakness.  The patient received IV TPA at [facility] Friday AM 06/21/2018.  Stroke:   right frontal lobe infarct - embolic - unknown etiology.  Resultant  Left side weakness  CT head - No acute intracranial abnormality.   CTA H&N  - Occluded right M2 branch - High-grade stenosis of the left internal carotid - Acute infarct of the right frontal lobe.   MRI head - not performed  MRA head - not performed  Carotid Doppler - CTA neck performed or pending - carotid dopplers not indicated.  2D Echo - EF 65% to 70%. No cardiac source of emboli identified.  LDL 42  HgbA1c - 5.7  VTE prophylaxis - SCDs Diet Order           Diet NPO time specified  Diet effective now          aspirin 81 mg daily and clopidogrel 75 mg daily prior to admission, now getting ASA suppositories  Patient will be counseled to be compliant with her antithrombotic medications  Ongoing aggressive stroke risk factor management  Therapy recommendations:  pending  Disposition:  Pending  Hypertension  Unstable -> Cleviprex drip and prn Labetalol. (Clonidine PTA) . SBP < 180 S/P TPA . Long-term BP goal normotensive  Hyperlipidemia  Lipid lowering medication PTA:  none  LDL 42, goal < 70  Current lipid lowering medication: none  Continue statin at discharge   Other Stroke Risk Factors  Advanced age  Former cigarette smoker - quit   Other Active Problems  Renal insufficiency - BUN 31 ; Creatinine 1.10 -> hydrate -> 1.04  Seen by Dr Leonel Ramsay last night for afib. New diagnosis. No anticoagulation due to size of stroke. Rate controlled.  Hypokalemia - supplement - recheck - 3.2 - IV runs plus 20 meq KCL each liter IV fluid.  Family declined feeding tube placement  Hypertension -> cleviprex      Hospital day # 2  Right MCA distribution ischemic infarct with right M2 stenosis s/p IV tPA without hemorrhagic transformation.   BP well controlled.  ECG shows possible intermittent a. Fib which may be underlying etiology.  Not a candidate for anticoagulation at this time.    Lipids are well controlled.  Not diabetic or smoker.  TTE was normal..  Currently receiving rectal ASA since cannot swallow.  Discussed with son need  for nutrition and oral meds which cannot be accomplished without NGT or PEG.  He believes he wants to give her more time to recover her swallowing.  I told him we can do that, but doubt it will happen.    Rogue Jury, MS, MD  To contact Stroke Continuity provider, please refer to [email or web address] After hours, contact General Neurology

## 2018-06-23 NOTE — Progress Notes (Signed)
Initial Nutrition Assessment  DOCUMENTATION CODES:   Severe malnutrition in context of chronic illness  INTERVENTION:  If family does chose to pursue enteral nutrition, recommend begin Vital 1.5 at 9mL/hr, increase by 10 every 12 hours to goal rate of 16mL/hr  Pro-stat 51mL BID  Patient is at risk for refeeding given diagnosis of severe malnutrition  Recommend monitor K+, PO4-, Mg every 12 hours and replete as needed with tubefeed rate advancement  At goal rate with cleviprex at 85mL/hr (192 calories) regimen provides 1552 calories, 87 grams of protein, 685mL free water  NUTRITION DIAGNOSIS:   Severe Malnutrition related to chronic illness(COPD, Cancer, TIA) as evidenced by severe fat depletion, severe muscle depletion.  GOAL:   Patient will meet greater than or equal to 90% of their needs  MONITOR:   Diet advancement  REASON FOR ASSESSMENT:   Consult Enteral/tube feeding initiation and management  ASSESSMENT:   Patient with PMH HTN, Cancer, COPD, TIA, Hip arthoplasty who presented to [facility] with sudden onset left-sided weakness while eating breakfast. Patient received IV TPA at [facility]. Found to have acute ischemic stroke.  RD consulted for tubefeeding in setting of severe oropharyngeal dysphagia. Family has refused NG tube at this time, wants to see if her swallowing will improve in a few days.  Son normally makes two meals a day for her at home and has seen her gain weight from 98 pounds to 105 pounds since leaving the facility she was at. Despite this she continues to exhibit severe muscle wasting and fat depletions all over. Patient is also mostly wheelchair bound at baseline. RD will continue to monitor swallowing and need for supplemental nutrition.  Labs reviewed:  K+ 3.2 BUN/Cr 24/1.04  Medications reviewed and include:  NS at 61mL/hr Cleviprex at 7mL/hr --> 192 calories   Intake/Output Summary (Last 24 hours) at 06/23/2018 1957 Last data filed at 06/23/2018  1800 Gross per 24 hour  Intake 1527.27 ml  Output 1750 ml  Net -222.73 ml  0.9L Fluid Positive   NUTRITION - FOCUSED PHYSICAL EXAM:    Most Recent Value  Orbital Region  Severe depletion  Upper Arm Region  Severe depletion  Thoracic and Lumbar Region  Severe depletion  Buccal Region  Severe depletion  Temple Region  Severe depletion  Clavicle Bone Region  Severe depletion  Clavicle and Acromion Bone Region  Severe depletion  Scapular Bone Region  Unable to assess  Dorsal Hand  Severe depletion  Patellar Region  Severe depletion  Anterior Thigh Region  Severe depletion  Posterior Calf Region  Severe depletion  Edema (RD Assessment)  None       Diet Order:   Diet Order           Diet NPO time specified  Diet effective now          EDUCATION NEEDS:   No education needs have been identified at this time  Skin:  Skin Assessment: Reviewed RN Assessment  Last BM:  PTA  Height:   Ht Readings from Last 1 Encounters:  06/21/18 5\' 7"  (1.702 m)    Weight:   Wt Readings from Last 1 Encounters:  06/21/18 111 lb 8.8 oz (50.6 kg)    Ideal Body Weight:  61.36 kg  BMI:  Body mass index is 17.47 kg/m.  Estimated Nutritional Needs:   Kcal:  1500-1715 calories  Protein:  76-86 grams (1.5-1.7g/kg)  Fluid:  >1.5L    Satira Anis. Dulcey Riederer, MS, RD LDN Inpatient Clinical Dietitian Pager  [phone]  

## 2018-06-24 DIAGNOSIS — I10 Essential (primary) hypertension: Secondary | ICD-10-CM

## 2018-06-24 DIAGNOSIS — R1312 Dysphagia, oropharyngeal phase: Secondary | ICD-10-CM

## 2018-06-24 DIAGNOSIS — I4892 Unspecified atrial flutter: Secondary | ICD-10-CM

## 2018-06-24 DIAGNOSIS — I480 Paroxysmal atrial fibrillation: Secondary | ICD-10-CM

## 2018-06-24 LAB — BASIC METABOLIC PANEL
Anion gap: 15 (ref 5–15)
BUN: 29 mg/dL — ABNORMAL HIGH (ref 8–23)
CALCIUM: 8.8 mg/dL — AB (ref 8.9–10.3)
CHLORIDE: 106 mmol/L (ref 98–111)
CO2: 20 mmol/L — AB (ref 22–32)
CREATININE: 1.22 mg/dL — AB (ref 0.44–1.00)
GFR calc non Af Amer: 39 mL/min — ABNORMAL LOW (ref >60)
GFR, EST AFRICAN AMERICAN: 45 mL/min — AB (ref >60)
GLUCOSE: 83 mg/dL (ref 70–99)
Potassium: 3.6 mmol/L (ref 3.5–5.1)
Sodium: 141 mmol/L (ref 135–145)

## 2018-06-24 LAB — GLUCOSE, CAPILLARY
GLUCOSE-CAPILLARY: 144 mg/dL — AB (ref 70–99)
Glucose-Capillary: 113 mg/dL — ABNORMAL HIGH (ref 70–99)

## 2018-06-24 LAB — MAGNESIUM
Magnesium: 2.2 mg/dL (ref 1.7–2.4)
Magnesium: 2.2 mg/dL (ref 1.7–2.4)

## 2018-06-24 LAB — PHOSPHORUS: Phosphorus: 4.3 mg/dL (ref 2.5–4.6)

## 2018-06-24 MED ORDER — ACETYLCYSTEINE 20 % IN SOLN
3.0000 mL | Freq: Three times a day (TID) | RESPIRATORY_TRACT | Status: DC
  Administered 2018-06-24 – 2018-06-26 (×6): 3 mL via RESPIRATORY_TRACT
  Filled 2018-06-24 (×8): qty 4

## 2018-06-24 MED ORDER — PRO-STAT SUGAR FREE PO LIQD
30.0000 mL | Freq: Every day | ORAL | Status: DC
  Administered 2018-06-24 – 2018-07-04 (×10): 30 mL
  Filled 2018-06-24 (×10): qty 30

## 2018-06-24 MED ORDER — FERROUS SULFATE 325 (65 FE) MG PO TABS
325.0000 mg | ORAL_TABLET | Freq: Two times a day (BID) | ORAL | Status: DC
  Administered 2018-06-24 – 2018-06-25 (×3): 325 mg via ORAL
  Filled 2018-06-24 (×2): qty 1

## 2018-06-24 MED ORDER — CLONIDINE HCL 0.1 MG PO TABS
0.1000 mg | ORAL_TABLET | Freq: Two times a day (BID) | ORAL | Status: DC
  Administered 2018-06-24 – 2018-06-25 (×3): 0.1 mg via ORAL
  Filled 2018-06-24 (×3): qty 1

## 2018-06-24 MED ORDER — ACETAMINOPHEN 325 MG PO TABS
650.0000 mg | ORAL_TABLET | Freq: Four times a day (QID) | ORAL | Status: DC | PRN
  Administered 2018-06-29 (×2): 650 mg via ORAL
  Filled 2018-06-24 (×2): qty 2

## 2018-06-24 MED ORDER — ASPIRIN 325 MG PO TABS
325.0000 mg | ORAL_TABLET | Freq: Every day | ORAL | Status: DC
  Administered 2018-06-25: 325 mg via ORAL
  Filled 2018-06-24: qty 1

## 2018-06-24 MED ORDER — PANTOPRAZOLE SODIUM 40 MG PO TBEC
40.0000 mg | DELAYED_RELEASE_TABLET | Freq: Every day | ORAL | Status: DC
  Administered 2018-06-24 – 2018-06-25 (×2): 40 mg via ORAL
  Filled 2018-06-24: qty 1

## 2018-06-24 MED ORDER — VITAMIN B-12 1000 MCG PO TABS
5000.0000 ug | ORAL_TABLET | Freq: Every day | ORAL | Status: DC
  Administered 2018-06-24 – 2018-06-25 (×2): 5000 ug via ORAL
  Filled 2018-06-24 (×3): qty 5

## 2018-06-24 MED ORDER — OSMOLITE 1.2 CAL PO LIQD
1000.0000 mL | ORAL | Status: DC
  Administered 2018-06-24 – 2018-06-25 (×2): 1000 mL
  Filled 2018-06-24 (×4): qty 1000

## 2018-06-24 MED ORDER — ATORVASTATIN CALCIUM 20 MG PO TABS
20.0000 mg | ORAL_TABLET | Freq: Every day | ORAL | Status: DC
  Administered 2018-06-24 – 2018-06-25 (×2): 20 mg via ORAL
  Filled 2018-06-24 (×2): qty 1

## 2018-06-24 MED ORDER — AMLODIPINE BESYLATE 10 MG PO TABS
10.0000 mg | ORAL_TABLET | Freq: Every day | ORAL | Status: DC
  Administered 2018-06-24 – 2018-06-25 (×2): 10 mg via ORAL
  Filled 2018-06-24: qty 1

## 2018-06-24 MED ORDER — METOPROLOL SUCCINATE ER 25 MG PO TB24: 25.0000 mg | ORAL_TABLET | Freq: Every day | ORAL | Status: DC

## 2018-06-24 MED ORDER — ALBUTEROL SULFATE (2.5 MG/3ML) 0.083% IN NEBU
2.5000 mg | INHALATION_SOLUTION | Freq: Four times a day (QID) | RESPIRATORY_TRACT | Status: DC | PRN
  Administered 2018-06-24 – 2018-07-04 (×8): 2.5 mg via RESPIRATORY_TRACT
  Filled 2018-06-24 (×8): qty 3

## 2018-06-24 NOTE — Progress Notes (Signed)
Nutrition Follow-up  DOCUMENTATION CODES:   Severe malnutrition in context of chronic illness, Underweight  INTERVENTION:   Initiate TF via Cortrak: - Start Osmolite 1.2 @ 20 ml/hr and increase by 10 ml q 12 hours to goal rate of 50 ml/hr (1200 ml/day) - 30 ml Pro-stat daily  Tube feeding regimen at goal rate provides 1540 kcal, 82 grams of protein, and 984 ml of H2O.  Monitor magnesium, potassium, and phosphorus q 12 hours for at least 3 days, MD to replete as needed, as pt is at risk for refeeding syndrome given severe protein calorie malnutrition and NPO since admission on 06/21/18.  NUTRITION DIAGNOSIS:   Severe Malnutrition related to chronic illness (COPD, cancer, TIA) as evidenced by severe fat depletion, severe muscle depletion.  Ongoing  GOAL:   Patient will meet greater than or equal to 90% of their needs  Unmet at this time  MONITOR:   Weight trends, TF tolerance, I & O's, Labs, Diet advancement  REASON FOR ASSESSMENT:   Consult Enteral/tube feeding initiation and management  ASSESSMENT:   Patient with PMH HTN, Cancer, COPD, TIA, Hip arthoplasty who presented to [facility] with sudden onset left-sided weakness while eating breakfast. Patient received IV TPA at [facility]. Found to have acute ischemic stroke.  7/29 - Cortrak placed in L nare  Discussed pt during ICU rounds and with RN. Cleviprex now d/c. Plan for Cortrak feeding tube placement today. RD consulted for TF initiation and management.  Noted RD saw pt yesterday and family had declined feeding tube placement in hopes that pt's swallowing improves. Family has now decided to proceed with Cortrak placement.  This RD present for Cortrak tube placement.  Medications reviewed and include: 20 mg Pepcid daily IVF: NaCl @ 50 ml/hr  Labs reviewed: CO2 20 (L), BUN 29 (H), creatinine 1.22 (H)  UOP: 1950 ml x 24 hours   Diet Order:   Diet Order           Diet NPO time specified  Diet effective now           EDUCATION NEEDS:   No education needs have been identified at this time  Skin:  Skin Assessment: Reviewed RN Assessment  Last BM:  06/21/18 (per pt)  Height:   Ht Readings from Last 1 Encounters:  06/21/18 5\' 7"  (1.702 m)    Weight:   Wt Readings from Last 1 Encounters:  06/21/18 111 lb 8.8 oz (50.6 kg)    Ideal Body Weight:  61.36 kg  BMI:  Body mass index is 17.47 kg/m.  Estimated Nutritional Needs:   Kcal:  1500-1700 kcal/day  Protein:  75-90 grams/day  Fluid:  1.5-1.7 L/day    Gaynell Face, MS, RD, LDN Pager: [phone] Weekend/After Hours: [phone]

## 2018-06-24 NOTE — Progress Notes (Signed)
STROKE TEAM PROGRESS NOTE   SUBJECTIVE (INTERVAL HISTORY) Patient son, daughter and granddaughter are at bedside.  Patient more awake alert today, able to follow commands, however still severe dysarthria and did not pass swallow.  Family initially wanted PEG tube but later agreed with cor Trak first.  Resume home BP meds.  OBJECTIVE Temp:  [98.2 F (36.8 C)-99.4 F (37.4 C)] 98.3 F (36.8 C) (07/29 0400) Pulse Rate:  [25-121] 117 (07/29 0700) Cardiac Rhythm: Normal sinus rhythm (07/28 2000) Resp:  [14-22] 18 (07/29 0700) BP: (131-197)/(57-165) 136/92 (07/29 0700) SpO2:  [88 %-100 %] 98 % (07/29 0700)  CBC:  Recent Labs  Lab 06/21/18 1102  WBC 8.6  NEUTROABS 7.2*  HGB 11.8*  HCT 35.6  MCV 87.0  PLT 299    Basic Metabolic Panel:  Recent Labs  Lab 06/23/18 0210 06/24/18 0216  NA 142 141  K 3.2* 3.6  CL 106 106  CO2 24 20*  GLUCOSE 87 83  BUN 24* 29*  CREATININE 1.04* 1.22*  CALCIUM 9.0 8.8*  MG  --  2.2    Lipid Panel:     Component Value Date/Time   CHOL 103 06/22/2018 0151   TRIG 67 06/22/2018 0151   HDL 48 06/22/2018 0151   CHOLHDL 2.1 06/22/2018 0151   VLDL 13 06/22/2018 0151   LDLCALC 42 06/22/2018 0151   HgbA1c:  Lab Results  Component Value Date   HGBA1C 5.7 (H) 06/22/2018   Urine Drug Screen:     Component Value Date/Time   LABOPIA NONE DETECTED 01/22/2018 1700   COCAINSCRNUR NONE DETECTED 01/22/2018 1700   LABBENZ NONE DETECTED 01/22/2018 1700   AMPHETMU NONE DETECTED 01/22/2018 1700   THCU NONE DETECTED 01/22/2018 1700   LABBARB NONE DETECTED 01/22/2018 1700    Alcohol Level     Component Value Date/Time   ETH <10 01/22/2018 2013    IMAGING  Ct Angio Head W Or Wo Contrast  Result Date: 06/21/2018 CLINICAL DATA:  Stroke, follow-up. Acute onset of left-sided weakness and facial droop with ice fixed to the right. EXAM: CT ANGIOGRAPHY HEAD AND NECK CT PERFUSION BRAIN TECHNIQUE: Multidetector CT imaging of the head and neck was  performed using the standard protocol during bolus administration of intravenous contrast. Multiplanar CT image reconstructions and MIPs were obtained to evaluate the vascular anatomy. Carotid stenosis measurements (when applicable) are obtained utilizing NASCET criteria, using the distal internal carotid diameter as the denominator. Multiphase CT imaging of the brain was performed following IV bolus contrast injection. Subsequent parametric perfusion maps were calculated using RAPID software. CONTRAST:  142mL ISOVUE-370 IOPAMIDOL (ISOVUE-370) INJECTION 76% COMPARISON:  CT head without contrast from the same day. MRI brain and MRA circle-of-Willis 01/23/2018. FINDINGS: CTA NECK FINDINGS Aortic arch: A 3 vessel arch configuration is present. Extensive atherosclerotic calcifications are present without a significant stenosis of the great vessel origins. There is no aneurysm. Right carotid system: Atherosclerotic calcifications are present along the wall of the right common carotid artery without a significant stenosis. There is calcified and noncalcified plaque through the right carotid bifurcation. There is no focal luminal stenosis relative to the more distal vessel. The cervical right ICA is within normal limits. Left carotid system: The left common carotid artery demonstrates mural calcifications without a significant luminal stenosis. Dense calcifications are present at the left carotid bifurcation. The lumen is narrowed to less than 1 mm. Additional distal calcifications are present in the proximal left ICA without a significant tandem stenosis. Vertebral arteries: The right  vertebral artery originates from the subclavian artery without significant stenosis. There is some tortuosity and calcification of the cervical right ICA without a significant stenosis. The left vertebral artery is occluded below the C1-2 level. Skeleton: Multilevel degenerative changes are noted in the cervical spine. Degenerative  anterolisthesis is present at C3-4 and to a greater extent at C4-5. There is chronic loss of disc space at C5-6 and C6-7. No focal lytic or blastic lesions are present. Alignment is otherwise anatomic. Other neck: The soft tissues the neck are otherwise unremarkable. There is no significant adenopathy. Thyroid is within normal limits. Salivary glands are unremarkable. Upper chest: Mild dependent atelectasis is present. The upper lung fields are otherwise clear without focal nodule, mass, or airspace disease. Ill-defined density in the left upper lobe is stable, likely remote scarring. Review of the MIP images confirms the above findings CTA HEAD FINDINGS Anterior circulation: Atherosclerotic calcifications are present through the cavernous internal carotid arteries bilaterally. There is no significant luminal stenosis relative to the ICA termini. The right A1 segment is dominant. There is mild narrowing of the proximal left M1 segment. The middle right M2 branch is occluded. There is distal left MCA atherosclerotic disease without focal stenosis or occlusion. Significant attenuation of branch vessels in the distal right MCA territory. No significant collaterals are present. Posterior circulation: The left vertebral artery is reconstituted at C1. Dense calcification is present at the dural margin of the vertebral artery on the left. The distal vertebral artery is opacified. The right vertebral artery is within normal limits. PICA origin is high. Basilar artery is within normal limits. Both posterior cerebral arteries originate from the basilar tip. There is mild distal small vessel disease without a significant proximal stenosis or occlusion. Venous sinuses: The dural sinuses are patent. The right transverse sinus is dominant. The straight sinus and deep cerebral veins are intact. Cortical veins are unremarkable. Anatomic variants: None Delayed phase: Postcontrast images demonstrate early loss of gray-white  differentiation in the anterior right frontal lobe. Review of the MIP images confirms the above findings CT Brain Perfusion Findings: CBF (<30%) Volume: 74mL Perfusion (Tmax>6.0s) volume: 139mL Mismatch Volume: 34mL Infarction Location:Right frontal lobe IMPRESSION: 1. Acute infarct of the right frontal lobe. Core infarct volume is 91 mL. 2. Occluded right M2 branch at the MCA bifurcation without distal reconstitution or collateral vessels. This corresponds to the infarct territory 3. Mild narrowing of the left M1 segment as described. 4. Diffuse distal small vessel disease without other significant proximal stenosis or occlusion in the circle-of-Willis. 5. High-grade stenosis of the left internal carotid artery at the bifurcation. 6. Atherosclerotic changes involving the right carotid bifurcation without significant stenosis. 7. Extensive atherosclerotic disease at the aortic arch without significant stenosis. 8. 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 It is reconstituted at C1-2 level. 9. Multilevel degenerative changes in the cervical spine These results were called by telephone at the time of interpretation on 06/21/2018 at 12:52 pm to Dr. Alexis Goodell , who verbally acknowledged these results. Electronically Signed   By: San Morelle M.D.   On: 06/21/2018 13:01   Ct Head Wo Contrast  Result Date: 06/22/2018 CLINICAL DATA:  Focal neuro deficit, less than 6 hours. TPA 24 hours ago. EXAM: CT HEAD WITHOUT CONTRAST TECHNIQUE: Contiguous axial images were obtained from the base of the skull through the vertex without intravenous contrast. COMPARISON:  CT head without contrast 06/21/2018. CTA head and neck and CT perfusion head 06/21/2018. FINDINGS: Brain: A large right MCA territory infarct corresponds  to the area of core infarct on the CT perfusion exam. There is involvement of the right frontal operculum and insular cortex. Basal ganglia are seen. Right ACA territory is spared. There is local mass effect  with effacement of the adjacent sulci. There is no midline shift. There is partial effacement of the right lateral ventricle. Left lateral ventricle is within normal limits. Brainstem and cerebellum are normal. There is no hemorrhage into the infarct. Vascular: Atherosclerotic calcifications are present within the cavernous internal carotid arteries bilaterally. There is no hyperdense vessel. Skull: Calvarium is intact. No focal lytic or blastic lesions are present. Sinuses/Orbits: The paranasal sinuses and mastoid air cells are clear. Bilateral lens replacements are present. Globes and orbits are otherwise within normal limits. IMPRESSION: 1. Involving nonhemorrhagic right MCA territory infarct corresponds with the area predicted by CT perfusion. 2. No hemorrhage within the infarct. 3. Local mass effect without significant midline shift. 4. Generalized atrophy and white matter disease is otherwise stable. Electronically Signed   By: San Morelle M.D.   On: 06/22/2018 12:09   Ct Angio Neck W Or Wo Contrast  Result Date: 06/21/2018 CLINICAL DATA:  Stroke, follow-up. Acute onset of left-sided weakness and facial droop with ice fixed to the right. EXAM: CT ANGIOGRAPHY HEAD AND NECK CT PERFUSION BRAIN TECHNIQUE: Multidetector CT imaging of the head and neck was performed using the standard protocol during bolus administration of intravenous contrast. Multiplanar CT image reconstructions and MIPs were obtained to evaluate the vascular anatomy. Carotid stenosis measurements (when applicable) are obtained utilizing NASCET criteria, using the distal internal carotid diameter as the denominator. Multiphase CT imaging of the brain was performed following IV bolus contrast injection. Subsequent parametric perfusion maps were calculated using RAPID software. CONTRAST:  151mL ISOVUE-370 IOPAMIDOL (ISOVUE-370) INJECTION 76% COMPARISON:  CT head without contrast from the same day. MRI brain and MRA circle-of-Willis  01/23/2018. FINDINGS: CTA NECK FINDINGS Aortic arch: A 3 vessel arch configuration is present. Extensive atherosclerotic calcifications are present without a significant stenosis of the great vessel origins. There is no aneurysm. Right carotid system: Atherosclerotic calcifications are present along the wall of the right common carotid artery without a significant stenosis. There is calcified and noncalcified plaque through the right carotid bifurcation. There is no focal luminal stenosis relative to the more distal vessel. The cervical right ICA is within normal limits. Left carotid system: The left common carotid artery demonstrates mural calcifications without a significant luminal stenosis. Dense calcifications are present at the left carotid bifurcation. The lumen is narrowed to less than 1 mm. Additional distal calcifications are present in the proximal left ICA without a significant tandem stenosis. Vertebral arteries: The right vertebral artery originates from the subclavian artery without significant stenosis. There is some tortuosity and calcification of the cervical right ICA without a significant stenosis. The left vertebral artery is occluded below the C1-2 level. Skeleton: Multilevel degenerative changes are noted in the cervical spine. Degenerative anterolisthesis is present at C3-4 and to a greater extent at C4-5. There is chronic loss of disc space at C5-6 and C6-7. No focal lytic or blastic lesions are present. Alignment is otherwise anatomic. Other neck: The soft tissues the neck are otherwise unremarkable. There is no significant adenopathy. Thyroid is within normal limits. Salivary glands are unremarkable. Upper chest: Mild dependent atelectasis is present. The upper lung fields are otherwise clear without focal nodule, mass, or airspace disease. Ill-defined density in the left upper lobe is stable, likely remote scarring. Review of the MIP  images confirms the above findings CTA HEAD FINDINGS  Anterior circulation: Atherosclerotic calcifications are present through the cavernous internal carotid arteries bilaterally. There is no significant luminal stenosis relative to the ICA termini. The right A1 segment is dominant. There is mild narrowing of the proximal left M1 segment. The middle right M2 branch is occluded. There is distal left MCA atherosclerotic disease without focal stenosis or occlusion. Significant attenuation of branch vessels in the distal right MCA territory. No significant collaterals are present. Posterior circulation: The left vertebral artery is reconstituted at C1. Dense calcification is present at the dural margin of the vertebral artery on the left. The distal vertebral artery is opacified. The right vertebral artery is within normal limits. PICA origin is high. Basilar artery is within normal limits. Both posterior cerebral arteries originate from the basilar tip. There is mild distal small vessel disease without a significant proximal stenosis or occlusion. Venous sinuses: The dural sinuses are patent. The right transverse sinus is dominant. The straight sinus and deep cerebral veins are intact. Cortical veins are unremarkable. Anatomic variants: None Delayed phase: Postcontrast images demonstrate early loss of gray-white differentiation in the anterior right frontal lobe. Review of the MIP images confirms the above findings CT Brain Perfusion Findings: CBF (<30%) Volume: 7mL Perfusion (Tmax>6.0s) volume: 123mL Mismatch Volume: 87mL Infarction Location:Right frontal lobe IMPRESSION: 1. Acute infarct of the right frontal lobe. Core infarct volume is 91 mL. 2. Occluded right M2 branch at the MCA bifurcation without distal reconstitution or collateral vessels. This corresponds to the infarct territory 3. Mild narrowing of the left M1 segment as described. 4. Diffuse distal small vessel disease without other significant proximal stenosis or occlusion in the circle-of-Willis. 5.  High-grade stenosis of the left internal carotid artery at the bifurcation. 6. Atherosclerotic changes involving the right carotid bifurcation without significant stenosis. 7. Extensive atherosclerotic disease at the aortic arch without significant stenosis. 8. 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 It is reconstituted at C1-2 level. 9. Multilevel degenerative changes in the cervical spine These results were called by telephone at the time of interpretation on 06/21/2018 at 12:52 pm to Dr. Alexis Goodell , who verbally acknowledged these results. Electronically Signed   By: San Morelle M.D.   On: 06/21/2018 13:01   Ct Cerebral Perfusion W Contrast  Result Date: 06/21/2018 CLINICAL DATA:  Stroke, follow-up. Acute onset of left-sided weakness and facial droop with ice fixed to the right. EXAM: CT ANGIOGRAPHY HEAD AND NECK CT PERFUSION BRAIN TECHNIQUE: Multidetector CT imaging of the head and neck was performed using the standard protocol during bolus administration of intravenous contrast. Multiplanar CT image reconstructions and MIPs were obtained to evaluate the vascular anatomy. Carotid stenosis measurements (when applicable) are obtained utilizing NASCET criteria, using the distal internal carotid diameter as the denominator. Multiphase CT imaging of the brain was performed following IV bolus contrast injection. Subsequent parametric perfusion maps were calculated using RAPID software. CONTRAST:  117mL ISOVUE-370 IOPAMIDOL (ISOVUE-370) INJECTION 76% COMPARISON:  CT head without contrast from the same day. MRI brain and MRA circle-of-Willis 01/23/2018. FINDINGS: CTA NECK FINDINGS Aortic arch: A 3 vessel arch configuration is present. Extensive atherosclerotic calcifications are present without a significant stenosis of the great vessel origins. There is no aneurysm. Right carotid system: Atherosclerotic calcifications are present along the wall of the right common carotid artery without a significant  stenosis. There is calcified and noncalcified plaque through the right carotid bifurcation. There is no focal luminal stenosis relative to the more distal vessel. The  cervical right ICA is within normal limits. Left carotid system: The left common carotid artery demonstrates mural calcifications without a significant luminal stenosis. Dense calcifications are present at the left carotid bifurcation. The lumen is narrowed to less than 1 mm. Additional distal calcifications are present in the proximal left ICA without a significant tandem stenosis. Vertebral arteries: The right vertebral artery originates from the subclavian artery without significant stenosis. There is some tortuosity and calcification of the cervical right ICA without a significant stenosis. The left vertebral artery is occluded below the C1-2 level. Skeleton: Multilevel degenerative changes are noted in the cervical spine. Degenerative anterolisthesis is present at C3-4 and to a greater extent at C4-5. There is chronic loss of disc space at C5-6 and C6-7. No focal lytic or blastic lesions are present. Alignment is otherwise anatomic. Other neck: The soft tissues the neck are otherwise unremarkable. There is no significant adenopathy. Thyroid is within normal limits. Salivary glands are unremarkable. Upper chest: Mild dependent atelectasis is present. The upper lung fields are otherwise clear without focal nodule, mass, or airspace disease. Ill-defined density in the left upper lobe is stable, likely remote scarring. Review of the MIP images confirms the above findings CTA HEAD FINDINGS Anterior circulation: Atherosclerotic calcifications are present through the cavernous internal carotid arteries bilaterally. There is no significant luminal stenosis relative to the ICA termini. The right A1 segment is dominant. There is mild narrowing of the proximal left M1 segment. The middle right M2 branch is occluded. There is distal left MCA atherosclerotic  disease without focal stenosis or occlusion. Significant attenuation of branch vessels in the distal right MCA territory. No significant collaterals are present. Posterior circulation: The left vertebral artery is reconstituted at C1. Dense calcification is present at the dural margin of the vertebral artery on the left. The distal vertebral artery is opacified. The right vertebral artery is within normal limits. PICA origin is high. Basilar artery is within normal limits. Both posterior cerebral arteries originate from the basilar tip. There is mild distal small vessel disease without a significant proximal stenosis or occlusion. Venous sinuses: The dural sinuses are patent. The right transverse sinus is dominant. The straight sinus and deep cerebral veins are intact. Cortical veins are unremarkable. Anatomic variants: None Delayed phase: Postcontrast images demonstrate early loss of gray-white differentiation in the anterior right frontal lobe. Review of the MIP images confirms the above findings CT Brain Perfusion Findings: CBF (<30%) Volume: 3mL Perfusion (Tmax>6.0s) volume: 163mL Mismatch Volume: 61mL Infarction Location:Right frontal lobe IMPRESSION: 1. Acute infarct of the right frontal lobe. Core infarct volume is 91 mL. 2. Occluded right M2 branch at the MCA bifurcation without distal reconstitution or collateral vessels. This corresponds to the infarct territory 3. Mild narrowing of the left M1 segment as described. 4. Diffuse distal small vessel disease without other significant proximal stenosis or occlusion in the circle-of-Willis. 5. High-grade stenosis of the left internal carotid artery at the bifurcation. 6. Atherosclerotic changes involving the right carotid bifurcation without significant stenosis. 7. Extensive atherosclerotic disease at the aortic arch without significant stenosis. 8. 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 It is reconstituted at C1-2 level. 9. Multilevel degenerative changes in the  cervical spine These results were called by telephone at the time of interpretation on 06/21/2018 at 12:52 pm to Dr. Alexis Goodell , who verbally acknowledged these results. Electronically Signed   By: San Morelle M.D.   On: 06/21/2018 13:01   Ct Head Code Stroke Wo Contrast  Result Date: 06/21/2018  CLINICAL DATA:  Code stroke. Focal neuro deficit, less than 6 hours, stroke suspected. Patient found unresponsive 1 hour ago. Acute onset of left-sided weakness and facial droop with ice fixed to the right. EXAM: CT HEAD WITHOUT CONTRAST TECHNIQUE: Contiguous axial images were obtained from the base of the skull through the vertex without intravenous contrast. COMPARISON:  CT head without contrast 02/22/2018 FINDINGS: Brain: Atrophy and moderate diffuse white matter changes are again seen bilaterally. No acute cortical infarct, hemorrhage, or mass lesion is present. The basal ganglia are intact. Insular ribbon is normal bilaterally. No acute or focal right-sided infarct is present. The ventricles are proportionate to the degree of atrophy and stable. The brainstem and cerebellum are normal. Vascular: Atherosclerotic calcifications are present in the cavernous and precavernous internal carotid arteries. There is no hyperdense vessel. Skull: Calvarium is intact. No focal lytic or blastic lesions are present. Sinuses/Orbits: Paranasal sinuses and mastoid air cells are clear. Bilateral lens replacements are present. Globes and orbits are otherwise within normal limits. ASPECTS [facility] Stroke Program Early CT Score) - Ganglionic level infarction (caudate, lentiform nuclei, internal capsule, insula, M1-M3 cortex): 7/7 - Supraganglionic infarction (M4-M6 cortex): 3/3 Total score (0-10 with 10 being normal): 10/10 IMPRESSION: 1. No acute intracranial abnormality. 2. ASPECTS is 10/10 3. Moderate atrophy and white matter disease is stable bilaterally. These results were called by telephone at the time of  interpretation on 06/21/2018 at 11:03 am to Dr. Harvest Dark , who verbally acknowledged these results. Electronically Signed   By: San Morelle M.D.   On: 06/21/2018 11:05   Transthoracic Echocardiogram  06/22/2018 Study Conclusions - Left ventricle: The cavity size was normal. Systolic function was   vigorous. The estimated ejection fraction was in the range of 65%   to 70%. Wall motion was normal; there were no regional wall   motion abnormalities. Features are consistent with a pseudonormal   left ventricular filling pattern, with concomitant abnormal   relaxation and increased filling pressure (grade 2 diastolic   dysfunction). Doppler parameters are consistent with high   ventricular filling pressure. - Aortic valve: Poorly visualized. Severely calcified annulus.   Trileaflet; moderately thickened, moderately calcified leaflets. - Mitral valve: Severely calcified annulus. Moderately thickened,   mildly calcified leaflets anterior. There was mild to moderate   regurgitation. - Atrial septum: There was increased thickness of the septum,   consistent with lipomatous hypertrophy. - Tricuspid valve: There was mild regurgitation. - Pulmonic valve: There was mild regurgitation.   PHYSICAL EXAM Vitals:   06/24/18 0615 06/24/18 0630 06/24/18 0645 06/24/18 0700  BP: (!) 171/88 (!) 149/70 (!) 163/96 (!) 136/92  Pulse: (!) 25 (!) 102 (!) 109 (!) 117  Resp: 17 17 19 18   Temp:      TempSrc:      SpO2: 94% 97% 97% 98%  Weight:      Height:        Temp:  [98.2 F (36.8 C)-98.6 F (37 C)] 98.2 F (36.8 C) (07/29 0800) Pulse Rate:  [25-121] 85 (07/29 1900) Resp:  [14-21] 17 (07/29 1900) BP: (128-200)/(45-165) 146/58 (07/29 1900) SpO2:  [89 %-100 %] 93 % (07/29 1930) Weight:  [104 lb 8 oz (47.4 kg)] 104 lb 8 oz (47.4 kg) (07/29 1626)  General -cachectic, well developed, in no apparent distress.  Ophthalmologic - fundi not visualized due to  noncooperation.  Cardiovascular - Regular rate and rhythm, not in A. fib.  Neuro -awake, alert, eyes open, orientated to self and people, not to  time.  Able to follow simple command on the left, severe dysarthria and hypophonia.  Right gaze preference, however able to cross midline, PERRL, with not blinking to visual threat on the left.  Left facial droop, tongue midline.  Left upper extremity 1/5 with pain summation, left lower extremity 0/5 proximal and 2/5 distally.  Right upper and lower extremity 4/5.  DTR 1+, no Babinski. Sensation, coordination and gait not tested.   ASSESSMENT/PLAN Ms. Cyanna Hanks Holle is a 84 y.o. female with history of LLE PVD with stenting, hip surgery, TIA, cancer, and HTN presenting with left sided weakness. The patient received IV TPA at [facility] Friday AM 06/21/2018.  Stroke: Large right MCA infarct - embolic due to A. fib/aflutter not on AC.  Resultant  Left side hemiplegia, right gaze preference  CT head - No acute intracranial abnormality.   CTA H&N - Occluded right M2 branch. High-grade stenosis of the left internal carotid furcation  CT perfusion - large infarct core   MRI head - not performed  2D Echo - EF 65% to 70%. No cardiac source of emboli identified.  LDL 42  HgbA1c - 5.7  VTE prophylaxis - SCDs  Diet n.p.o.  aspirin 81 mg daily and clopidogrel 75 mg daily prior to admission, now aspirin 325.  Will start anticoagulation in 7 to 10 days given large size of stroke  Ongoing aggressive stroke risk factor management  Therapy recommendations:  pending  Disposition:  Pending  A. fib/a flutter not on AC  As per family, patient has diagnosed with A. fib in the past not on AC, but on aspirin and Plavix  Again confirmed in ICU  On aspirin 325  We will start anticoagulation in 7 to 10 days given large size of the stroke  Hypertension  Unstable -> Cleviprex drip and prn Labetalol . SBP < 180  . Resume home BP  medications including Norvasc and clonidine.  Add metoprolol . Long-term BP goal normotensive  Dysphagia  Did not pass swallow  On cor Trak for tube feeding  Likely needed PEG tube  Trauma team consulted  Speech on board  Other Stroke Risk Factors  Advanced age  Former cigarette smoker - quit  Other Active Problems  Elevated creatinine 1.10 -> 1.04 -> 1.22  Hypokalemia - supplement 3.2 -> 3.6  Family declined feeding tube placement  Dietitian consult 06/23/2018 - Severe malnutrition in context of chronic illness.  Hospital day # 3  This patient is critically ill due to large right MCA stroke, A. fib with RVR, dysphasia and at significant risk of neurological worsening, death form recurrent stroke, hemorrhagic conversion, heart failure, aspiration pneumonia. This patient's care requires constant monitoring of vital signs, hemodynamics, respiratory and cardiac monitoring, review of multiple databases, neurological assessment, discussion with family, other specialists and medical decision making of high complexity. I spent 40 minutes of neurocritical care time in the care of this patient. I had long discussion with son, daughter and granddaughter at bedside, updated pt current condition, treatment plan and potential prognosis. They expressed understanding and appreciation.  Also had long discussion with them regarding cor Trak versus PEG tube.  Rosalin Hawking, MD PhD Stroke Neurology 06/24/2018 7:56 PM  To contact Stroke Continuity provider, please refer to [email or web address] After hours, contact General Neurology

## 2018-06-24 NOTE — Consult Note (Signed)
[facility] Consult/Admission Note  Dawn [name] 10-20-1928  [id number].    Requesting MD: Dr. Erlinda Hong Chief Complaint/Reason for Consult: PEG  HPI:   Pt is a 84 yo female with a history of hypertension, cancer, hip arthroplasty who presented to [facility] ER on 07/26 with sudden onset left-sided weakness while eating breakfast around 10:15am. Patient received IV TPA at [facility]. CT angiogram showed occluded right M2 MCA branch. Pt was transferred to [facility] neuro ICU for further evaluation management. Pt has residual L sided weakness and dysphagia. Pt has been having difficulty managing her secretions. She is follow by speech and she currently has a cortrak. She failed a bedside swallow and is NPO. She denies abdominal pain and her only complaint is her secretions. Pt is able to phonate but is difficult to understand. Son, grandson and granddaughter at bedside. Pt was on plavix and ASA prior to her hospitalization. She has had a cholecystectomy, appendectomy, and a hysterectomy.   ROS:  Review of Systems  Constitutional: Negative for chills, diaphoresis and fever.  HENT: Negative for sore throat.   Respiratory: Negative for cough and shortness of breath.   Cardiovascular: Negative for chest pain.  Gastrointestinal: Negative for abdominal pain, blood in stool, constipation, diarrhea, nausea and vomiting.  Genitourinary: Negative for dysuria.  Skin: Negative for rash.  Neurological: Positive for focal weakness (L sided). Negative for dizziness, sensory change and loss of consciousness.  All other systems reviewed and are negative.    Family History  Problem Relation Age of Onset  . Diabetes Father   . Heart disease Father     Past Medical History:  Diagnosis Date  . Cancer ([place])    skin cancer  . 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 Hypertension   . Sciatica     Past Surgical  History:  Procedure Laterality Date  . ABDOMINAL HYSTERECTOMY    . APPENDECTOMY    . CATARACT EXTRACTION, BILATERAL    . CHOLECYSTECTOMY    . ENDARTERECTOMY FEMORAL Right 04/19/2017   Procedure: ENDARTERECTOMY COMMON FEMORAL ARTERY, PROFUNDA  FEMORIS ARTERY, and  SUPERFICIAL FEMORAL ARTERY;  Surgeon: Algernon Huxley, MD;  Location: [facility] ORS;  Service: Vascular;  Laterality: Right;  . ENDARTERECTOMY FEMORAL Left 11/14/2017   Procedure: ENDARTERECTOMY FEMORAL;  Surgeon: Algernon Huxley, MD;  Location: [facility] ORS;  Service: Vascular;  Laterality: Left;  . HIP SURGERY Bilateral   . LOWER EXTREMITY ANGIOGRAM Right 04/19/2017   Procedure: LOWER EXTREMITY ANGIOGRAM WITH SUPERFICIAL FEMORAL ARTERY STENT PLACEMENT;  Surgeon: Algernon Huxley, MD;  Location: [facility] ORS;  Service: Vascular;  Laterality: Right;  . LOWER EXTREMITY ANGIOGRAPHY Right 04/18/2017   Procedure: Lower Extremity Angiography;  Surgeon: Algernon Huxley, MD;  Location: [place] CV LAB;  Service: Cardiovascular;  Laterality: Right;  . LOWER EXTREMITY ANGIOGRAPHY Left 07/12/2017   Procedure: Lower Extremity Angiography;  Surgeon: Algernon Huxley, MD;  Location: [place] CV LAB;  Service: Cardiovascular;  Laterality: Left;  . LOWER EXTREMITY ANGIOGRAPHY Left 11/09/2017   Procedure: LOWER EXTREMITY ANGIOGRAPHY;  Surgeon: Algernon Huxley, MD;  Location: [place] CV LAB;  Service: Cardiovascular;  Laterality: Left;  . LOWER EXTREMITY INTERVENTION  07/12/2017   Procedure: Lower Extremity Intervention;  Surgeon: Algernon Huxley, MD;  Location: [place] CV LAB;  Service: Cardiovascular;;  . LOWER EXTREMITY INTERVENTION  11/09/2017   Procedure: LOWER EXTREMITY INTERVENTION;  Surgeon: Lucky Cowboy,  Erskine Squibb, MD;  Location: [place] CV LAB;  Service: Cardiovascular;;  . RENAL ANGIOGRAPHY N/A 06/21/2017   Procedure: Renal Angiography;  Surgeon: Algernon Huxley, MD;  Location: [place] CV LAB;  Service: Cardiovascular;  Laterality: N/A;  . SHOULDER SURGERY  Bilateral   . TOTAL HIP ARTHROPLASTY Bilateral     Social History:  reports that she quit smoking about 39 years ago. Her smoking use included cigarettes. She quit after 30.00 years of use. She has never used smokeless tobacco. She reports that she does not drink alcohol or use drugs.  Allergies:  Allergies  Allergen Reactions  . Percocet [Oxycodone-Acetaminophen] Itching  . Percodan [Oxycodone-Aspirin] Itching    Medications Prior to Admission  Medication Sig Dispense Refill  . acetaminophen (TYLENOL) 325 MG tablet Take 2 tablets (650 mg total) by mouth every 6 (six) hours as needed for mild pain (or Fever >/= 101).    Marland Kitchen aspirin EC 81 MG tablet Take 81 mg by mouth daily.    Marland Kitchen atorvastatin (LIPITOR) 20 MG tablet TAKE 1 TABLET BY MOUTH ONCE DAILY AT  6PM 30 tablet 3  . Cholecalciferol (VITAMIN D3) 400 units CAPS Take 400 Units by mouth daily.    . cloNIDine (CATAPRES) 0.1 MG tablet Take 1 tablet (0.1 mg total) by mouth 2 (two) times daily. (Patient taking differently: Take 0.2 mg by mouth 2 (two) times daily. ) 60 tablet 0  . clopidogrel (PLAVIX) 75 MG tablet Take 1 tablet (75 mg total) by mouth daily.    . Cyanocobalamin (B-12) 5000 MCG CAPS Take 5,000 mcg by mouth daily.     Marland Kitchen doxycycline (VIBRA-TABS) 100 MG tablet Take 100 mg by mouth 2 (two) times daily.  0  . ferrous sulfate 325 (65 FE) MG tablet Take 1 tablet (325 mg total) by mouth 2 (two) times daily with a meal. 60 tablet 3  . furosemide (LASIX) 20 MG tablet TAKE 1 TABLET BY MOUTH ONCE DAILY AS NEEDED FOR EDEMA  5  . indomethacin (INDOCIN) 50 MG capsule Take 50 mg by mouth 2 (two) times daily as needed (for Gout).    Marland Kitchen ketotifen (ZADITOR) 0.025 % ophthalmic solution Place 1 drop into both eyes as needed (itching, dry eyes).    . metoprolol succinate (TOPROL-XL) 25 MG 24 hr tablet Take 25 mg by mouth daily.     . Multiple Minerals-Vitamins (LEG-GESIC PO) Take 0.5 tablets by mouth at bedtime as needed. For restless leg syndrome  (legatrin pm)    . ondansetron (ZOFRAN-ODT) 4 MG disintegrating tablet Take 4 mg by mouth every 6 (six) hours as needed for nausea.    . predniSONE (DELTASONE) 10 MG tablet Take 10 mg by mouth taper from 4 doses each day to 1 dose and stop.   0  . senna-docusate (SENOKOT-S) 8.6-50 MG tablet Take 1 tablet by mouth 2 (two) times daily as needed for mild constipation. 60 tablet 0  . traMADol (ULTRAM) 50 MG tablet Take 1 tablet (50 mg total) by mouth every 6 (six) hours as needed. 20 tablet 0  . acidophilus (RISAQUAD) CAPS capsule Take 1 capsule by mouth daily. (Patient not taking: Reported on 06/21/2018) 30 capsule 3  . amLODipine (NORVASC) 10 MG tablet Take 1 tablet (10 mg total) by mouth daily. (Patient not taking: Reported on 06/21/2018) 30 tablet 3  . ipratropium-albuterol (DUONEB) 0.5-2.5 (3) MG/3ML SOLN Take 3 mLs by nebulization every 6 (six) hours as needed. (Patient not taking: Reported on 06/22/2018) 360 mL   .  pantoprazole (PROTONIX) 40 MG tablet Take 1 tablet (40 mg total) by mouth 2 (two) times daily before a meal. (Patient not taking: Reported on 06/21/2018)      Blood pressure (!) 190/78, pulse 91, temperature (!) 97.1 F (36.2 C), temperature source Oral, resp. rate 18, height _0  (1.702 m), weight 50.6 kg (111 lb 8.8 oz), SpO2 96 %.  Physical Exam  Constitutional: She is oriented to person, place, and time. Vital signs are normal. She appears cachectic. She is cooperative. She appears ill. No distress.  cortrak in place  HENT:  Head: Normocephalic and atraumatic.  Nose: Nose normal.  Mouth/Throat: Uvula is midline and oropharynx is clear and moist. Mucous membranes are dry. No oropharyngeal exudate.  Eyes: Pupils are equal, round, and reactive to light. Conjunctivae, EOM and lids are normal. Right eye exhibits no discharge. Left eye exhibits no discharge. No scleral icterus.  Neck: Normal range of motion. Neck supple. No thyromegaly present.  Cardiovascular: Normal rate, regular  rhythm, normal heart sounds and intact distal pulses. Exam reveals no gallop and no friction rub.  No murmur heard. Pulses:      Posterior tibial pulses are 2+ on the right side, and 2+ on the left side.  Pulmonary/Chest: Effort normal. No respiratory distress. She has no decreased breath sounds. She has no wheezes. She has rhonchi (upper airway ). She has no rales.  Abdominal: Soft. Normal appearance and bowel sounds are normal. She exhibits no distension and no mass. There is no hepatosplenomegaly. There is no tenderness. There is no rigidity, no rebound and no guarding.    RUQ scar, RLQ scar, did not appreciate hysterectomy scar  Musculoskeletal: She exhibits no edema.  Lymphadenopathy:    She has no cervical adenopathy.  Neurological: She is alert and oriented to person, place, and time. No sensory deficit.  L sided weakness  Skin: Skin is warm and dry. No rash noted. She is not diaphoretic.  Nursing note and vitals reviewed.   Results for orders placed or performed during the hospital encounter of 06/21/18 (from the past 48 hour(s))  Basic metabolic panel     Status: Abnormal   Collection Time: 06/23/18  2:10 AM  Result Value Ref Range   Sodium 142 135 - 145 mmol/L   Potassium 3.2 (L) 3.5 - 5.1 mmol/L   Chloride 106 98 - 111 mmol/L   CO2 24 22 - 32 mmol/L   Glucose, Bld 87 70 - 99 mg/dL   BUN 24 (H) 8 - 23 mg/dL   Creatinine, Ser 1.04 (H) 0.44 - 1.00 mg/dL   Calcium 9.0 8.9 - 10.3 mg/dL   GFR calc non Af Amer 47 (L) >60 mL/min   GFR calc Af Amer 54 (L) >60 mL/min    Comment: (NOTE) The eGFR has been calculated using the CKD EPI equation. This calculation has not been validated in all clinical situations. eGFR's persistently <60 mL/min signify possible Chronic Kidney Disease.    Anion gap 12 5 - 15    Comment: Performed at [facility] [address], [place], [place] [address]  Basic metabolic panel     Status: Abnormal   Collection Time: 06/24/18  2:16 AM    Result Value Ref Range   Sodium 141 135 - 145 mmol/L   Potassium 3.6 3.5 - 5.1 mmol/L   Chloride 106 98 - 111 mmol/L   CO2 20 (L) 22 - 32 mmol/L   Glucose, Bld 83 70 - 99 mg/dL  BUN 29 (H) 8 - 23 mg/dL   Creatinine, Ser 1.22 (H) 0.44 - 1.00 mg/dL   Calcium 8.8 (L) 8.9 - 10.3 mg/dL   GFR calc non Af Amer 39 (L) >60 mL/min   GFR calc Af Amer 45 (L) >60 mL/min    Comment: (NOTE) The eGFR has been calculated using the CKD EPI equation. This calculation has not been validated in all clinical situations. eGFR's persistently <60 mL/min signify possible Chronic Kidney Disease.    Anion gap 15 5 - 15    Comment: Performed at [facility] [address], [place], [place] [address]  Magnesium     Status: None   Collection Time: 06/24/18  2:16 AM  Result Value Ref Range   Magnesium 2.2 1.7 - 2.4 mg/dL    Comment: Performed at [facility] [address], [place], [place] [address]   No results found.    Assessment/Plan Active Problems:   Acute right arterial ischemic stroke, middle cerebral artery (MCA) (HCC)  PEG  - family would like to see if pt progress and her swallowing improves before proceeding with PEG placement - I discussed the procedure in detail and answered questions - Would do procedure in ENDO as opposed to bedside   Please page [place] when the family is ready to proceed. Thank you for the consult.   Kalman Drape, [facility] Surgery 06/24/2018, 3:32 PM Pager: [phone] Consults: [phone] Mon-Fri 7:00 am-4:30 pm Sat-Sun 7:00 am-11:30 am

## 2018-06-24 NOTE — Progress Notes (Signed)
Cortrak Tube Team Note:  Consult received to place a Cortrak feeding tube.   A 10 F Cortrak tube was placed in the LEFT nare and secured with a nasal bridle at 79 cm. Per the Cortrak monitor reading the tube tip is post-pyloric (D1).   No x-ray is required. RN may begin using tube.   If the tube becomes dislodged please keep the tube and contact the Cortrak team at [email or web address] ([email or web address]) for replacement.  If after hours and replacement cannot be delayed, place a NG tube and confirm placement with an abdominal x-ray.    Kerman Passey MS, RD, [place], [place] [phone] Pager  [phone] Weekend/On-Call Pager

## 2018-06-25 ENCOUNTER — Other Ambulatory Visit: Payer: Self-pay

## 2018-06-25 DIAGNOSIS — R6889 Other general symptoms and signs: Secondary | ICD-10-CM

## 2018-06-25 DIAGNOSIS — I63411 Cerebral infarction due to embolism of right middle cerebral artery: Principal | ICD-10-CM

## 2018-06-25 DIAGNOSIS — Z7189 Other specified counseling: Secondary | ICD-10-CM

## 2018-06-25 DIAGNOSIS — Z515 Encounter for palliative care: Secondary | ICD-10-CM

## 2018-06-25 DIAGNOSIS — E876 Hypokalemia: Secondary | ICD-10-CM

## 2018-06-25 DIAGNOSIS — N179 Acute kidney failure, unspecified: Secondary | ICD-10-CM

## 2018-06-25 LAB — GLUCOSE, CAPILLARY
GLUCOSE-CAPILLARY: 132 mg/dL — AB (ref 70–99)
GLUCOSE-CAPILLARY: 130 mg/dL — AB (ref 70–99)
GLUCOSE-CAPILLARY: 113 mg/dL — AB (ref 70–99)
Glucose-Capillary: 127 mg/dL — ABNORMAL HIGH (ref 70–99)
Glucose-Capillary: 141 mg/dL — ABNORMAL HIGH (ref 70–99)
Glucose-Capillary: 124 mg/dL — ABNORMAL HIGH (ref 70–99)

## 2018-06-25 LAB — BASIC METABOLIC PANEL
Anion gap: 11 (ref 5–15)
BUN: 40 mg/dL — ABNORMAL HIGH (ref 8–23)
CALCIUM: 8.7 mg/dL — AB (ref 8.9–10.3)
CHLORIDE: 109 mmol/L (ref 98–111)
CO2: 22 mmol/L (ref 22–32)
Creatinine, Ser: 1.1 mg/dL — ABNORMAL HIGH (ref 0.44–1.00)
GFR calc Af Amer: 51 mL/min — ABNORMAL LOW (ref >60)
GFR calc non Af Amer: 44 mL/min — ABNORMAL LOW (ref >60)
GLUCOSE: 127 mg/dL — AB (ref 70–99)
Potassium: 2.9 mmol/L — ABNORMAL LOW (ref 3.5–5.1)
Sodium: 142 mmol/L (ref 135–145)

## 2018-06-25 LAB — MAGNESIUM: Magnesium: 2.2 mg/dL (ref 1.7–2.4)

## 2018-06-25 LAB — PHOSPHORUS: Phosphorus: 3.6 mg/dL (ref 2.5–4.6)

## 2018-06-25 MED ORDER — GLYCOPYRROLATE 0.2 MG/ML IJ SOLN
0.2000 mg | Freq: Three times a day (TID) | INTRAMUSCULAR | Status: DC
  Administered 2018-06-25 – 2018-06-30 (×13): 0.2 mg via INTRAVENOUS
  Filled 2018-06-25 (×14): qty 1

## 2018-06-25 MED ORDER — HEPARIN SODIUM (PORCINE) 5000 UNIT/ML IJ SOLN
5000.0000 [IU] | Freq: Two times a day (BID) | INTRAMUSCULAR | Status: DC
  Administered 2018-06-25 – 2018-07-01 (×10): 5000 [IU] via SUBCUTANEOUS
  Filled 2018-06-25 (×11): qty 1

## 2018-06-25 MED ORDER — METOPROLOL TARTRATE 25 MG/10 ML ORAL SUSPENSION
12.5000 mg | Freq: Two times a day (BID) | ORAL | Status: DC
  Administered 2018-06-25 – 2018-06-26 (×3): 12.5 mg
  Filled 2018-06-25 (×3): qty 10

## 2018-06-25 MED ORDER — POTASSIUM CHLORIDE 20 MEQ/15ML (10%) PO SOLN
40.0000 meq | ORAL | Status: AC
  Administered 2018-06-25 (×3): 40 meq via ORAL
  Filled 2018-06-25 (×3): qty 30

## 2018-06-25 MED ORDER — BISACODYL 10 MG RE SUPP
10.0000 mg | Freq: Every day | RECTAL | Status: DC | PRN
  Administered 2018-06-25 – 2018-07-01 (×2): 10 mg via RECTAL
  Filled 2018-06-25 (×2): qty 1

## 2018-06-25 NOTE — Progress Notes (Signed)
OT Cancellation Note  Patient Details Name: Deetta Siegmann MRN: [id number] DOB: 1928-04-11   Cancelled Treatment:    Reason Eval/Treat Not Completed: Patient at procedure or test/ unavailable. Attempted x2; first attempt pt working with SLP, second attempt pt and family speaking with Palliative. Will follow up as time allows.  Earlean Shawl A. Ulice Brilliant, M.S., OTR/L Acute Rehab Department: [phone]   06/25/2018, 11:03 AM

## 2018-06-25 NOTE — Care Management Note (Signed)
Case Management Note  Patient Details  Name: Amra Shukla MRN: [id number] Date of Birth: 03/17/1928  Subjective/Objective:  84 y.o. female with PMH: HTN, cancer, hip arthroplasty who presesnted to [facility] ER with sudden onset left-sided weakness. CT Head positive for non-hemorrhagic right MCA territory infarct.  PTA, pt needed assistance with ADLS, and is mostly WC bound, per son.  She lives at home with son, who is her POA.                    Action/Plan: PT/OT recommending SNF at dc; CSW consulted to facilitate dc to SNF upon medical stability.    Expected Discharge Date:                  Expected Discharge Plan:  Skilled Nursing Facility  In-House Referral:  Clinical Social Work  Discharge planning Services  CM Consult  Post Acute Care Choice:    Choice offered to:     DME Arranged:    DME Agency:     HH Arranged:    [place] Agency:     Status of Service:  In process, will continue to follow  If discussed at Long Length of Stay Meetings, dates discussed:    Additional Comments:  Reinaldo Raddle, RN, BSN  Trauma/Neuro ICU Case Manager [phone]

## 2018-06-25 NOTE — Consult Note (Signed)
Consultation Note Date: 06/25/2018   Patient Name: Dawn [name]  DOB: 1928/04/30  MRN: [id number]  Age / Sex: 84 y.o., female  PCP: Idelle Crouch, MD Referring Physician: Rosalin Hawking, MD  Reason for Consultation: Establishing goals of care  HPI/Patient Profile: 84 y.o. female  with past medical history of sciatica and HTN who was admitted on 06/21/2018 with sudden onset of left sided weakness. She was noted to have new onset afib with RVR.  Imaging revealed a right MCA CVA.   She received IV TPA at [facility] and then was transferred to [facility] for further evaluation and care.  She has received an echo which shows grade 2 diastolic dysfunction, and severely calcified aortic and mitral valves.  PT evaluation indicates that she needs 2+ assist with all mobility including rolling over in bed.  She has been having difficulty with secretions and has failed her swallow evaluation.  Clinical Assessment and Goals of Care:  I have reviewed medical records including EPIC notes, labs and imaging, received report from the care team, assessed the patient and then met at the bedside along with her son, daughter and grand daughter to discuss diagnosis prognosis, GOC, EOL wishes, disposition and options.  [name] is her primary care taker and medical surrogate decision maker, her daughter, [name] is an ER Therapist, sports, and her grand daughter [name] is a Education officer, museum.  I introduced Palliative Medicine as specialized medical care for people living with serious illness. It focuses on providing relief from the symptoms and stress of a serious illness. The goal is to improve quality of life for both the patient and the family.  We discussed a brief life review of the patient. 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  She stayed active with gardening, fishing and bowling.  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    The past year has been very difficult for her.  She has had multiple stents placed for PVD.  She had difficulty with clots developing around the stents.  In January 2019 she developed noro virus and a UTI for which she was hospitalized.  There was concern for GIB and her plavix was held she then had a TIA.  In late March she had a bad fall and hit her head.  She had difficulty with swallowing/secretions and some aspiration during this time frame.  She was in and out of SNF.  Finally in April she had a period of wellness.  She was able to resume a regular diet (rather than thicken liquids) and she came home to live with her son.  She was able to walk with a four pronged walker but did not enjoy going out much.  Recently she developed a periodic low grade fever (over the last 1-2 weeks) which concerned her son.  The family reports that she has had some difficulty over the  years with clearing her throat and had developed a regular cough.  They questioned whether she may have a degree of COPD after 40+ years of smoking or whether she may need an esophageal dilation.   We discussed her current illness and what it means in the larger context of her on-going co-morbidities.  Natural disease trajectory and expectations at EOL were discussed.  Specifically we discussed her frailty and concern for aspiration. I attempted to elicit values and goals of care important to the patient.  The family expressed and the patient agreed that mobility is very important to the patient.  She does not want to be bed bound.  It will be important to be able to get out of bed and move around even if it is in a wheelchair.   Her son also expressed that his mother does not want to be a burden on her family.  Their goal is to rehab as much as possible and then get her home with appropriate care.  They do not want her to stay  long term in a SNF facility - they are set up at home to handle her however they do not have 24 hour care at home.  Both Legrand Como and [name] expressed that when the time comes they want their mother to have a peaceful comfortable death.  We discussed Palliative Care and Hospice Care at length.  I explained that a peaceful comfortable death does not just happen - it takes advanced planning.    Advanced directives, concepts specific to code status, artifical feeding and hydration, and rehospitalization were considered and discussed.  The patient is a DNR and DNI.Marland Kitchen  Otherwise she is full scope treatment.  They want artificial hydration, nutrition and rehospitalization.  We discussed PEG at length.  Both adult children are very much in support of moving forward with PEG.  Despite risk of recurrent aspiration which could lead to a life of recurrent aspiration pneumonia.   Legrand Como would like to give his mother more time with the cor trak to determine if she will improve and a PEG could be avoided.  [name] wants a permanent PEG as a source of access for supplemental nutrition and for medications.  She feels it will be needed repeatedly when her mother returns to the hospital.  None of the family members felt the patient was ready for Hospice yet.    Hospice and Palliative Care services outpatient were explained and offered.  The patient is current active with Palliative Care thru [facility].  Questions and concerns were addressed.  The family was encouraged to call with questions or concerns.    Primary Decision Maker:  PATIENT.  [name] is the Air traffic controller.  The patient, Legrand Como and [name] all turn to Legrand Como to make decisions.    SUMMARY OF RECOMMENDATIONS     If the patient does not improve with Cor Trak in XX period of time then move forward with PEG placement.   Continue to provide excellent SLP/PT/OT therapy.  Family hopeful for discharge to  [facility] for Acute Rehab.  Palliative ([facility]) to follow at SNF.  Please note in DC summary that Palliative to follow at SNF.  Will order robinul 0.2 TID for secretions.  Robinul does not cross the blood brain barrier.   Patient indicates that she wants the tube out of her nose.  Per patient having a degree of mobility is an absolute.  Quality  of life would be unacceptable if she were bed bound.  Code Status/Advance Care Planning:  DNR / DNI    Additional Recommendations (Limitations, Scope, Preferences):  Full Scope Treatment;   No invasive procedures.  Palliative Prophylaxis:   Aspiration  Psycho-social/Spiritual:   Desire for further Chaplaincy support:  yes  Prognosis:   Difficult to determine.  Less that 6 months is certainly likely given advanced frailty, recurrent hospitalizations, large Rt MCA CVA, recurrent aspiration, diastolic heart failure.    Discharge Planning: [place] for rehab with Palliative care service follow-up      Primary Diagnoses: Present on Admission: . Acute right arterial ischemic stroke, middle cerebral artery (MCA) ([place])   I have reviewed the medical record, interviewed the patient and family, and examined the patient. The following aspects are pertinent.  Past Medical History:  Diagnosis Date  . Cancer ([place])    skin cancer  . 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 Hypertension   . Sciatica    Social History   Socioeconomic History  . Marital status: Widowed    Spouse name: Not on file  . Number of children: Not on file  . Years of education: Not on file  . Highest education level: Not on file  Occupational History  . Occupation: retired  Scientific laboratory technician  . Financial resource strain: Not hard at all  . Food insecurity:    Worry: Patient refused    Inability: Patient refused  . Transportation needs:    Medical: Patient refused     Non-medical: Patient refused  Tobacco Use  . Smoking status: Former Smoker    Years: 30.00    Types: Cigarettes    Last attempt to quit: 1980    Years since quitting: 39.6  . Smokeless tobacco: Never Used  Substance and Sexual Activity  . Alcohol use: No  . Drug use: No  . Sexual activity: Never  Lifestyle  . Physical activity:    Days per week: Patient refused    Minutes per session: Patient refused  . Stress: Not at all  Relationships  . Social connections:    Talks on phone: Patient refused    Gets together: Patient refused    Attends religious service: Patient refused    Active member of club or organization: Patient refused    Attends meetings of clubs or organizations: Patient refused    Relationship status: Patient refused  Other Topics Concern  . Not on file  Social History Narrative  . Not on file   Family History  Problem Relation Age of Onset  . Diabetes Father   . Heart disease Father    Scheduled Meds: . acetylcysteine  3 mL Nebulization TID  . amLODipine  10 mg Oral Daily  . aspirin  325 mg Oral Daily  . atorvastatin  20 mg Oral q1800  . chlorhexidine  15 mL Mouth Rinse BID  . cloNIDine  0.1 mg Oral BID  . feeding supplement (PRO-STAT SUGAR FREE 64)  30 mL Per Tube Daily  . ferrous sulfate  325 mg Oral BID WC  . mouth rinse  15 mL Mouth Rinse q12n4p  . metoprolol succinate  25 mg Oral Daily  . pantoprazole  40 mg Oral Daily  . vitamin B-12  5,000 mcg Oral Daily   Continuous Infusions: . sodium chloride 20 mL/hr at 06/25/18 0700  . clevidipine Stopped (06/25/18 0326)  . feeding supplement (OSMOLITE 1.2  CAL) 30 mL/hr at 06/25/18 0429   PRN Meds:.acetaminophen, albuterol, labetalol, senna-docusate Allergies  Allergen Reactions  . Percocet [Oxycodone-Acetaminophen] Itching  . Percodan [Oxycodone-Aspirin] Itching   Review of Systems patient dysarthric  Physical Exam  Thin frail elderly female,  Cor trak in nose, mouth open and dry, awake,  alert, dysarthric - unintelligible speech Resp weak wet cough with excess secretions.  No increased work of breathing. CV irreg irreg but rate controlled. Abdomen thin, soft, nt   Vital Signs: BP (!) 166/65   Pulse 74   Temp 97.6 F (36.4 C) (Axillary)   Resp 14   Ht _0  (1.702 m)   Wt 47.4 kg (104 lb 8 oz)   SpO2 93%   BMI 16.37 kg/m  Pain Scale: 0-10   Pain Score: 0-No pain   SpO2: SpO2: 93 % O2 Device:SpO2: 93 % O2 Flow Rate: .O2 Flow Rate (L/min): 2 L/min  IO: Intake/output summary:   Intake/Output Summary (Last 24 hours) at 06/25/2018 0840 Last data filed at 06/25/2018 0700 Gross per 24 hour  Intake 1230.11 ml  Output 900 ml  Net 330.11 ml    LBM: Last BM Date: (PTA) Baseline Weight: Weight: 50.6 kg (111 lb 8.8 oz) Most recent weight: Weight: 47.4 kg (104 lb 8 oz)     Palliative Assessment/Data:  20%     Time In: 10:00 Time Out: 11:10 Time Total: 70 min. Greater than 50%  of this time was spent counseling and coordinating care related to the above assessment and plan.  Signed by: Florentina Jenny, PA-C Palliative Medicine Pager: [phone]  Please contact Palliative Medicine Team phone at [phone] for questions and concerns.  For individual provider: See Shea Evans

## 2018-06-25 NOTE — Procedures (Signed)
Objective Swallowing Evaluation: Type of Study: FEES-Fiberoptic Endoscopic Evaluation of Swallow   Patient Details  Name: Dawn [name] MRN: [id number] Date of Birth: Sep 30, 1928  Today's Date: 06/25/2018 Time: SLP Start Time (ACUTE ONLY): 1400 -SLP Stop Time (ACUTE ONLY): 1430  SLP Time Calculation (min) (ACUTE ONLY): 30 min   Past Medical History:  Past Medical History:  Diagnosis Date  . Cancer ([place])    skin cancer  . 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 Hypertension   . Sciatica    Past Surgical History:  Past Surgical History:  Procedure Laterality Date  . ABDOMINAL HYSTERECTOMY    . APPENDECTOMY    . CATARACT EXTRACTION, BILATERAL    . CHOLECYSTECTOMY    . ENDARTERECTOMY FEMORAL Right 04/19/2017   Procedure: ENDARTERECTOMY COMMON FEMORAL ARTERY, PROFUNDA  FEMORIS ARTERY, and  SUPERFICIAL FEMORAL ARTERY;  Surgeon: Algernon Huxley, MD;  Location: [facility] ORS;  Service: Vascular;  Laterality: Right;  . ENDARTERECTOMY FEMORAL Left 11/14/2017   Procedure: ENDARTERECTOMY FEMORAL;  Surgeon: Algernon Huxley, MD;  Location: [facility] ORS;  Service: Vascular;  Laterality: Left;  . HIP SURGERY Bilateral   . LOWER EXTREMITY ANGIOGRAM Right 04/19/2017   Procedure: LOWER EXTREMITY ANGIOGRAM WITH SUPERFICIAL FEMORAL ARTERY STENT PLACEMENT;  Surgeon: Algernon Huxley, MD;  Location: [facility] ORS;  Service: Vascular;  Laterality: Right;  . LOWER EXTREMITY ANGIOGRAPHY Right 04/18/2017   Procedure: Lower Extremity Angiography;  Surgeon: Algernon Huxley, MD;  Location: [place] CV LAB;  Service: Cardiovascular;  Laterality: Right;  . LOWER EXTREMITY ANGIOGRAPHY Left 07/12/2017   Procedure: Lower Extremity Angiography;  Surgeon: Algernon Huxley, MD;  Location: [place] [facility];  Service: Cardiovascular;  Laterality: Left;  . LOWER EXTREMITY ANGIOGRAPHY Left 11/09/2017   Procedure: LOWER EXTREMITY ANGIOGRAPHY;  Surgeon: Algernon Huxley, MD;  Location: [place] [facility];  Service: Cardiovascular;  Laterality: Left;  . LOWER EXTREMITY INTERVENTION  07/12/2017   Procedure: Lower Extremity Intervention;  Surgeon: Algernon Huxley, MD;  Location: [place] [facility];  Service: Cardiovascular;;  . LOWER EXTREMITY INTERVENTION  11/09/2017   Procedure: LOWER EXTREMITY INTERVENTION;  Surgeon: Algernon Huxley, MD;  Location: [place] [facility];  Service: Cardiovascular;;  . RENAL ANGIOGRAPHY N/A 06/21/2017   Procedure: Renal Angiography;  Surgeon: Algernon Huxley, MD;  Location: [place] [facility];  Service: Cardiovascular;  Laterality: N/A;  . SHOULDER SURGERY Bilateral   . TOTAL HIP ARTHROPLASTY Bilateral    HPI: Patient is an 84 y.o. female with PMH: HTN, cancer, hip arthroplasty who presesnted to [facility] ER with sudden onset left-sided weakness while eating breakfast. At baseline, patient walks short distances with walker but is mostly wheelchair bound. She is able to feed herself but needs help with bathing and toileting. CT Head positive for non-hemorrhagic right MCA territory infarct.   Subjective: pleasant, very dysarthric, inattentive to left    Assessment / Plan / Recommendation  CHL IP CLINICAL IMPRESSIONS 06/25/2018  Clinical Impression Pt demonstrates profound oropharyngeal dysphagia. Pt is alert and participatory, making her best effort. Direct visualization of airway reveals narrow pharynx due to protruding pharyngeal wall (suspect spinal curvature or potential osteophytes) but otherwise normal laryngeal structures and mucosa.  Severe frothy secretions move in and out of glottis, laryngeal vestibule, pool in lateral channels. Volitional and reflexive coughing mobilizes but does no eject secretions. Despite max interventions to aid in eliciting swallow (verbal cues, repositioning  head, dry spoon pressure to tongue, oral sensory feedback wtih an ice chip) pt achieved only barely visible intances of laryngeal elevation, but no hyoid  excursion epiglottic deflection or UES opening, that she identified as having swallowed. Described impairment to pt and son with visual feedback and ended study after only one ice chip given due to severity of dysphagia. Discussed poor prognosis further with son. Will f/u for further trials.   SLP Visit Diagnosis Dysphagia, oropharyngeal phase (R13.12)  Attention and concentration deficit following --  Frontal lobe and executive function deficit following --  Impact on safety and function Severe aspiration risk      CHL IP TREATMENT RECOMMENDATION 06/25/2018  Treatment Recommendations Therapy as outlined in treatment plan below     Prognosis 06/25/2018  Prognosis for Safe Diet Advancement Guarded  Barriers to Reach Goals Severity of deficits  Barriers/Prognosis Comment --    CHL IP DIET RECOMMENDATION 06/25/2018  SLP Diet Recommendations NPO;Alternative means - temporary  Liquid Administration via --  Medication Administration --  Compensations --  Postural Changes --      CHL IP OTHER RECOMMENDATIONS 01/23/2018  Recommended Consults --  Oral Care Recommendations --  Other Recommendations Order thickener from pharmacy;Prohibited food (jello, ice cream, thin soups);Remove water pitcher;Have oral suction available      CHL IP FOLLOW UP RECOMMENDATIONS 06/25/2018  Follow up Recommendations Skilled Nursing facility      [facility] IP FREQUENCY AND DURATION 06/22/2018  Speech Therapy Frequency (ACUTE ONLY) min 3x week  Treatment Duration --           CHL IP ORAL PHASE 06/25/2018  Oral Phase Impaired  Oral - Pudding Teaspoon --  Oral - Pudding Cup --  Oral - Honey Teaspoon --  Oral - Honey Cup --  Oral - Nectar Teaspoon --  Oral - Nectar Cup --  Oral - Nectar Straw --  Oral - Thin Teaspoon --  Oral - Thin Cup --  Oral - Thin Straw --  Oral - Puree --  Oral - Mech Soft --  Oral - Regular --  Oral - Multi-Consistency --  Oral - Pill --  Oral Phase - Comment ice, dry spoon, needed  max sensory feedback to achieve brief labial closure. minimal oral manipulation, passive spill to pharynx.     CHL IP PHARYNGEAL PHASE 06/25/2018  Pharyngeal Phase Impaired  Pharyngeal- Pudding Teaspoon --  Pharyngeal --  Pharyngeal- Pudding Cup --  Pharyngeal --  Pharyngeal- Honey Teaspoon --  Pharyngeal --  Pharyngeal- Honey Cup --  Pharyngeal --  Pharyngeal- Nectar Teaspoon --  Pharyngeal --  Pharyngeal- Nectar Cup --  Pharyngeal --  Pharyngeal- Nectar Straw --  Pharyngeal --  Pharyngeal- Thin Teaspoon --  Pharyngeal --  Pharyngeal- Thin Cup --  Pharyngeal --  Pharyngeal- Thin Straw --  Pharyngeal --  Pharyngeal- Puree --  Pharyngeal --  Pharyngeal- Mechanical Soft --  Pharyngeal --  Pharyngeal- Regular --  Pharyngeal --  Pharyngeal- Multi-consistency --  Pharyngeal --  Pharyngeal- Pill --  Pharyngeal --  Pharyngeal Comment --     No flowsheet data found.  Herbie Baltimore, [place] CCC-SLP [phone]  Lynann Beaver 06/25/2018, 2:46 PM

## 2018-06-25 NOTE — Progress Notes (Addendum)
Physical Therapy Treatment Patient Details Name: Dawn [name] MRN: [id number] DOB: [birth year] Today's Date: 06/25/2018    History of Present Illness 84 y.o. female with PMH: HTN, cancer, hip arthroplasty who presesnted to [facility] ER with sudden onset left-sided weakness. CT Head positive for non-hemorrhagic right MCA territory infarct.    PT Comments    Patient seen for activity progression. Tolerated EOB and OOB to chair with VSS today. Continues to have audible wet cough which worsens with movement. PAtient required 2 person max assist for all aspects of mobility. Current POC remains appropriate. Encouraged son to interact with patient from left side to promote cervical range and attention.   Follow Up Recommendations  SNF;Supervision/Assistance - 24 hour(if progressing (may consider CIR for decr BOC))     Equipment Recommendations  None recommended by PT    Recommendations for Other Services       Precautions / Restrictions Precautions Precautions: Fall Restrictions Weight Bearing Restrictions: No    Mobility  Bed Mobility Overal bed mobility: Needs Assistance Bed Mobility: Supine to Sit;Sit to Supine     Supine to sit: Max assist;+2 for physical assistance Sit to supine: Max assist;+2 for physical assistance   General bed mobility comments: +2 max assist to come to EOB, patient was able to move RLE to EOB with cues and required assist for LLE and elevation of trunk to upright. Max assist to return to supine and reposition in bed  Transfers Overall transfer level: Needs assistance Equipment used: 2 person hand held assist Transfers: Sit to/from [facility] Sit to Stand: Max assist;+2 physical assistance Stand pivot transfers: Max assist;+2 physical assistance       General transfer comment: Patient able to hold to therapist arms and power up with RLE during transition, maxx assist of two persons with face to face and chuck pad  to complete transfer  Ambulation/Gait             General Gait Details: unable to perform   Stairs             Wheelchair Mobility    Modified Rankin (Stroke Patients Only) Modified Rankin (Stroke Patients Only) Pre-Morbid Rankin Score: Moderately severe disability Modified Rankin: Severe disability     Balance Overall balance assessment: Needs assistance Sitting-balance support: Feet unsupported Sitting balance-Leahy Scale: Poor Sitting balance - Comments: continues to require hands on physical assist to maintain sitting balance at EOB.  Attempts to release UE from bed to use tissue, with noted instability at trunk   Standing balance support: Bilateral upper extremity supported Standing balance-Leahy Scale: Zero                              Cognition Arousal/Alertness: Awake/alert   Overall Cognitive Status: Impaired/Different from baseline Area of Impairment: Following commands;Awareness;Safety/judgement;Problem solving;Attention                   Current Attention Level: Sustained   Following Commands: Follows one step commands with increased time;Follows one step commands consistently Safety/Judgement: Decreased awareness of safety;Decreased awareness of deficits Awareness: Intellectual Problem Solving: Slow processing;Requires verbal cues;Requires tactile cues        Exercises      General Comments General comments (skin integrity, edema, etc.): son present throughout session      Pertinent Vitals/Pain Pain Assessment: Faces Faces Pain Scale: Hurts a little bit Pain Location: general discomfort Pain Intervention(s): Monitored during session;Repositioned  Home Living                      Prior Function            PT Goals (current goals can now be found in the care plan section) Acute Rehab PT Goals Patient Stated Goal: to get better PT Goal Formulation: With family Time For Goal Achievement:  07/07/18 Potential to Achieve Goals: Fair Progress towards PT goals: Progressing toward goals(modest progression OOB)    Frequency    Min 3X/week      PT Plan Current plan remains appropriate    Co-evaluation PT/OT/SLP Co-Evaluation/Treatment: Yes Reason for Co-Treatment: Complexity of the patient's impairments (multi-system involvement) PT goals addressed during session: Mobility/safety with mobility OT goals addressed during session: ADL's and self-care      AM-PAC PT "6 Clicks" Daily Activity  Outcome Measure  Difficulty turning over in bed (including adjusting bedclothes, sheets and blankets)?: Unable Difficulty moving from lying on back to sitting on the side of the bed? : Unable Difficulty sitting down on and standing up from a chair with arms (e.g., wheelchair, bedside commode, etc,.)?: Unable Help needed moving to and from a bed to chair (including a wheelchair)?: Total Help needed walking in hospital room?: Total Help needed climbing 3-5 steps with a railing? : Total 6 Click Score: 6    End of Session   Activity Tolerance: Treatment limited secondary to medical complications (Comment)(lightheaded, diaphoretic and increased headache in sitting) Patient left: in bed;with call bell/phone within reach;with family/visitor present(chair position) Nurse Communication: Mobility status PT Visit Diagnosis: Other symptoms and signs involving the nervous system (R29.898);Hemiplegia and hemiparesis Hemiplegia - Right/Left: Left Hemiplegia - dominant/non-dominant: Non-dominant     Time: 1610-9604 PT Time Calculation (min) (ACUTE ONLY): 23 min  Charges:  $Therapeutic Activity: 8-22 mins                     Alben Deeds, PT DPT  Board Certified Neurologic Specialist [place] 06/25/2018, 11:36 AM

## 2018-06-25 NOTE — Progress Notes (Signed)
  Speech Language Pathology Treatment: Dysphagia  Patient Details Name: Dawn [name] MRN: [id number] DOB: 1928-01-17 Today's Date: 06/25/2018 Time: 1030-1055 SLP Time Calculation (min) (ACUTE ONLY): 25 min  Assessment / Plan / Recommendation Clinical Impression  Pt continues to demonstrate inability to expectorate secretions, even with max interventions to encourage force of cough (repositioning, head support, verbal cues, modeling, sensory feedback). Pt did elicit numerous volitional swallows with assist, particularly cues to close lips and total assist to reposition head to neutral/forward leaning. Encouraged pt to attempt swallowing of secretions throughout the day as she does no initiate this independently. Will proceed with FEES for objective assessment of swallowing, discussed with pt and family and addressed prognosis for swallowing with palliative care provider and family, which is poor. Son will be present for FEES.   HPI HPI: Patient is an 84 y.o. female with PMH: HTN, cancer, hip arthroplasty who presesnted to [facility] ER with sudden onset left-sided weakness while eating breakfast. At baseline, patient walks short distances with walker but is mostly wheelchair bound. She is able to feed herself but needs help with bathing and toileting. CT Head positive for non-hemorrhagic right MCA territory infarct.      SLP Plan  Other (Comment)(FEES)       Recommendations  Diet recommendations: NPO                Oral Care Recommendations: Oral care QID Follow up Recommendations: Skilled Nursing facility Plan: Other (Comment)(FEES)       GO                Dawn [name], Dawn [name] 06/25/2018, 11:26 AM

## 2018-06-25 NOTE — Progress Notes (Signed)
STROKE TEAM PROGRESS NOTE   SUBJECTIVE (INTERVAL HISTORY) Patient RN is at bedside.  Patient awake alert today, able to follow limited commands, still severe dysarthria and did not pass swallow. Had cortrak placed yesterday and on tube feeding. Off cleviprex now and on BP meds. Still has thick secretions, on nebulization and NT suctioning.   OBJECTIVE Temp:  [97.5 F (36.4 C)-98 F (36.7 C)] 97.7 F (36.5 C) (07/30 0800) Pulse Rate:  [69-91] 77 (07/30 1024) Cardiac Rhythm: Atrial fibrillation (07/30 0100) Resp:  [14-19] 14 (07/30 0715) BP: (127-200)/(44-128) 149/54 (07/30 1024) SpO2:  [89 %-100 %] 93 % (07/30 0720) Weight:  [104 lb 8 oz (47.4 kg)] 104 lb 8 oz (47.4 kg) (07/29 1626)  CBC:  Recent Labs  Lab 06/21/18 1102  WBC 8.6  NEUTROABS 7.2*  HGB 11.8*  HCT 35.6  MCV 87.0  PLT 270    Basic Metabolic Panel:  Recent Labs  Lab 06/24/18 0216 06/24/18 1703 06/25/18 0412  NA 141  --  142  K 3.6  --  2.9*  CL 106  --  109  CO2 20*  --  22  GLUCOSE 83  --  127*  BUN 29*  --  40*  CREATININE 1.22*  --  1.10*  CALCIUM 8.8*  --  8.7*  MG 2.2 2.2 2.2  PHOS  --  4.3 3.6    Lipid Panel:     Component Value Date/Time   CHOL 103 06/22/2018 0151   TRIG 67 06/22/2018 0151   HDL 48 06/22/2018 0151   CHOLHDL 2.1 06/22/2018 0151   VLDL 13 06/22/2018 0151   LDLCALC 42 06/22/2018 0151   HgbA1c:  Lab Results  Component Value Date   HGBA1C 5.7 (H) 06/22/2018   Urine Drug Screen:     Component Value Date/Time   LABOPIA NONE DETECTED 01/22/2018 1700   COCAINSCRNUR NONE DETECTED 01/22/2018 1700   LABBENZ NONE DETECTED 01/22/2018 1700   AMPHETMU NONE DETECTED 01/22/2018 1700   THCU NONE DETECTED 01/22/2018 1700   LABBARB NONE DETECTED 01/22/2018 1700    Alcohol Level     Component Value Date/Time   ETH <10 01/22/2018 2013    IMAGING  Ct Angio Head W Or Wo Contrast  Result Date: 06/21/2018 CLINICAL DATA:  Stroke, follow-up. Acute onset of left-sided weakness and  facial droop with ice fixed to the right. EXAM: CT ANGIOGRAPHY HEAD AND NECK CT PERFUSION BRAIN TECHNIQUE: Multidetector CT imaging of the head and neck was performed using the standard protocol during bolus administration of intravenous contrast. Multiplanar CT image reconstructions and MIPs were obtained to evaluate the vascular anatomy. Carotid stenosis measurements (when applicable) are obtained utilizing NASCET criteria, using the distal internal carotid diameter as the denominator. Multiphase CT imaging of the brain was performed following IV bolus contrast injection. Subsequent parametric perfusion maps were calculated using RAPID software. CONTRAST:  120mL ISOVUE-370 IOPAMIDOL (ISOVUE-370) INJECTION 76% COMPARISON:  CT head without contrast from the same day. MRI brain and MRA circle-of-Willis 01/23/2018. FINDINGS: CTA NECK FINDINGS Aortic arch: A 3 vessel arch configuration is present. Extensive atherosclerotic calcifications are present without a significant stenosis of the great vessel origins. There is no aneurysm. Right carotid system: Atherosclerotic calcifications are present along the wall of the right common carotid artery without a significant stenosis. There is calcified and noncalcified plaque through the right carotid bifurcation. There is no focal luminal stenosis relative to the more distal vessel. The cervical right ICA is within normal limits. Left carotid system:  The left common carotid artery demonstrates mural calcifications without a significant luminal stenosis. Dense calcifications are present at the left carotid bifurcation. The lumen is narrowed to less than 1 mm. Additional distal calcifications are present in the proximal left ICA without a significant tandem stenosis. Vertebral arteries: The right vertebral artery originates from the subclavian artery without significant stenosis. There is some tortuosity and calcification of the cervical right ICA without a significant stenosis.  The left vertebral artery is occluded below the C1-2 level. Skeleton: Multilevel degenerative changes are noted in the cervical spine. Degenerative anterolisthesis is present at C3-4 and to a greater extent at C4-5. There is chronic loss of disc space at C5-6 and C6-7. No focal lytic or blastic lesions are present. Alignment is otherwise anatomic. Other neck: The soft tissues the neck are otherwise unremarkable. There is no significant adenopathy. Thyroid is within normal limits. Salivary glands are unremarkable. Upper chest: Mild dependent atelectasis is present. The upper lung fields are otherwise clear without focal nodule, mass, or airspace disease. Ill-defined density in the left upper lobe is stable, likely remote scarring. Review of the MIP images confirms the above findings CTA HEAD FINDINGS Anterior circulation: Atherosclerotic calcifications are present through the cavernous internal carotid arteries bilaterally. There is no significant luminal stenosis relative to the ICA termini. The right A1 segment is dominant. There is mild narrowing of the proximal left M1 segment. The middle right M2 branch is occluded. There is distal left MCA atherosclerotic disease without focal stenosis or occlusion. Significant attenuation of branch vessels in the distal right MCA territory. No significant collaterals are present. Posterior circulation: The left vertebral artery is reconstituted at C1. Dense calcification is present at the dural margin of the vertebral artery on the left. The distal vertebral artery is opacified. The right vertebral artery is within normal limits. PICA origin is high. Basilar artery is within normal limits. Both posterior cerebral arteries originate from the basilar tip. There is mild distal small vessel disease without a significant proximal stenosis or occlusion. Venous sinuses: The dural sinuses are patent. The right transverse sinus is dominant. The straight sinus and deep cerebral veins  are intact. Cortical veins are unremarkable. Anatomic variants: None Delayed phase: Postcontrast images demonstrate early loss of gray-white differentiation in the anterior right frontal lobe. Review of the MIP images confirms the above findings CT Brain Perfusion Findings: CBF (<30%) Volume: 58mL Perfusion (Tmax>6.0s) volume: 113mL Mismatch Volume: 109mL Infarction Location:Right frontal lobe IMPRESSION: 1. Acute infarct of the right frontal lobe. Core infarct volume is 91 mL. 2. Occluded right M2 branch at the MCA bifurcation without distal reconstitution or collateral vessels. This corresponds to the infarct territory 3. Mild narrowing of the left M1 segment as described. 4. Diffuse distal small vessel disease without other significant proximal stenosis or occlusion in the circle-of-Willis. 5. High-grade stenosis of the left internal carotid artery at the bifurcation. 6. Atherosclerotic changes involving the right carotid bifurcation without significant stenosis. 7. Extensive atherosclerotic disease at the aortic arch without significant stenosis. 8. 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 It is reconstituted at C1-2 level. 9. Multilevel degenerative changes in the cervical spine These results were called by telephone at the time of interpretation on 06/21/2018 at 12:52 pm to Dr. Alexis Goodell , who verbally acknowledged these results. Electronically Signed   By: San Morelle M.D.   On: 06/21/2018 13:01   Ct Head Wo Contrast  Result Date: 06/22/2018 CLINICAL DATA:  Focal neuro deficit, less than 6 hours. TPA 24  hours ago. EXAM: CT HEAD WITHOUT CONTRAST TECHNIQUE: Contiguous axial images were obtained from the base of the skull through the vertex without intravenous contrast. COMPARISON:  CT head without contrast 06/21/2018. CTA head and neck and CT perfusion head 06/21/2018. FINDINGS: Brain: A large right MCA territory infarct corresponds to the area of core infarct on the CT perfusion exam. There is  involvement of the right frontal operculum and insular cortex. Basal ganglia are seen. Right ACA territory is spared. There is local mass effect with effacement of the adjacent sulci. There is no midline shift. There is partial effacement of the right lateral ventricle. Left lateral ventricle is within normal limits. Brainstem and cerebellum are normal. There is no hemorrhage into the infarct. Vascular: Atherosclerotic calcifications are present within the cavernous internal carotid arteries bilaterally. There is no hyperdense vessel. Skull: Calvarium is intact. No focal lytic or blastic lesions are present. Sinuses/Orbits: The paranasal sinuses and mastoid air cells are clear. Bilateral lens replacements are present. Globes and orbits are otherwise within normal limits. IMPRESSION: 1. Involving nonhemorrhagic right MCA territory infarct corresponds with the area predicted by CT perfusion. 2. No hemorrhage within the infarct. 3. Local mass effect without significant midline shift. 4. Generalized atrophy and white matter disease is otherwise stable. Electronically Signed   By: San Morelle M.D.   On: 06/22/2018 12:09   Ct Angio Neck W Or Wo Contrast  Result Date: 06/21/2018 CLINICAL DATA:  Stroke, follow-up. Acute onset of left-sided weakness and facial droop with ice fixed to the right. EXAM: CT ANGIOGRAPHY HEAD AND NECK CT PERFUSION BRAIN TECHNIQUE: Multidetector CT imaging of the head and neck was performed using the standard protocol during bolus administration of intravenous contrast. Multiplanar CT image reconstructions and MIPs were obtained to evaluate the vascular anatomy. Carotid stenosis measurements (when applicable) are obtained utilizing NASCET criteria, using the distal internal carotid diameter as the denominator. Multiphase CT imaging of the brain was performed following IV bolus contrast injection. Subsequent parametric perfusion maps were calculated using RAPID software. CONTRAST:   154mL ISOVUE-370 IOPAMIDOL (ISOVUE-370) INJECTION 76% COMPARISON:  CT head without contrast from the same day. MRI brain and MRA circle-of-Willis 01/23/2018. FINDINGS: CTA NECK FINDINGS Aortic arch: A 3 vessel arch configuration is present. Extensive atherosclerotic calcifications are present without a significant stenosis of the great vessel origins. There is no aneurysm. Right carotid system: Atherosclerotic calcifications are present along the wall of the right common carotid artery without a significant stenosis. There is calcified and noncalcified plaque through the right carotid bifurcation. There is no focal luminal stenosis relative to the more distal vessel. The cervical right ICA is within normal limits. Left carotid system: The left common carotid artery demonstrates mural calcifications without a significant luminal stenosis. Dense calcifications are present at the left carotid bifurcation. The lumen is narrowed to less than 1 mm. Additional distal calcifications are present in the proximal left ICA without a significant tandem stenosis. Vertebral arteries: The right vertebral artery originates from the subclavian artery without significant stenosis. There is some tortuosity and calcification of the cervical right ICA without a significant stenosis. The left vertebral artery is occluded below the C1-2 level. Skeleton: Multilevel degenerative changes are noted in the cervical spine. Degenerative anterolisthesis is present at C3-4 and to a greater extent at C4-5. There is chronic loss of disc space at C5-6 and C6-7. No focal lytic or blastic lesions are present. Alignment is otherwise anatomic. Other neck: The soft tissues the neck are otherwise unremarkable. There  is no significant adenopathy. Thyroid is within normal limits. Salivary glands are unremarkable. Upper chest: Mild dependent atelectasis is present. The upper lung fields are otherwise clear without focal nodule, mass, or airspace disease.  Ill-defined density in the left upper lobe is stable, likely remote scarring. Review of the MIP images confirms the above findings CTA HEAD FINDINGS Anterior circulation: Atherosclerotic calcifications are present through the cavernous internal carotid arteries bilaterally. There is no significant luminal stenosis relative to the ICA termini. The right A1 segment is dominant. There is mild narrowing of the proximal left M1 segment. The middle right M2 branch is occluded. There is distal left MCA atherosclerotic disease without focal stenosis or occlusion. Significant attenuation of branch vessels in the distal right MCA territory. No significant collaterals are present. Posterior circulation: The left vertebral artery is reconstituted at C1. Dense calcification is present at the dural margin of the vertebral artery on the left. The distal vertebral artery is opacified. The right vertebral artery is within normal limits. PICA origin is high. Basilar artery is within normal limits. Both posterior cerebral arteries originate from the basilar tip. There is mild distal small vessel disease without a significant proximal stenosis or occlusion. Venous sinuses: The dural sinuses are patent. The right transverse sinus is dominant. The straight sinus and deep cerebral veins are intact. Cortical veins are unremarkable. Anatomic variants: None Delayed phase: Postcontrast images demonstrate early loss of gray-white differentiation in the anterior right frontal lobe. Review of the MIP images confirms the above findings CT Brain Perfusion Findings: CBF (<30%) Volume: 59mL Perfusion (Tmax>6.0s) volume: 16mL Mismatch Volume: 4mL Infarction Location:Right frontal lobe IMPRESSION: 1. Acute infarct of the right frontal lobe. Core infarct volume is 91 mL. 2. Occluded right M2 branch at the MCA bifurcation without distal reconstitution or collateral vessels. This corresponds to the infarct territory 3. Mild narrowing of the left M1  segment as described. 4. Diffuse distal small vessel disease without other significant proximal stenosis or occlusion in the circle-of-Willis. 5. High-grade stenosis of the left internal carotid artery at the bifurcation. 6. Atherosclerotic changes involving the right carotid bifurcation without significant stenosis. 7. Extensive atherosclerotic disease at the aortic arch without significant stenosis. 8. 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 It is reconstituted at C1-2 level. 9. Multilevel degenerative changes in the cervical spine These results were called by telephone at the time of interpretation on 06/21/2018 at 12:52 pm to Dr. Alexis Goodell , who verbally acknowledged these results. Electronically Signed   By: San Morelle M.D.   On: 06/21/2018 13:01   Ct Cerebral Perfusion W Contrast  Result Date: 06/21/2018 CLINICAL DATA:  Stroke, follow-up. Acute onset of left-sided weakness and facial droop with ice fixed to the right. EXAM: CT ANGIOGRAPHY HEAD AND NECK CT PERFUSION BRAIN TECHNIQUE: Multidetector CT imaging of the head and neck was performed using the standard protocol during bolus administration of intravenous contrast. Multiplanar CT image reconstructions and MIPs were obtained to evaluate the vascular anatomy. Carotid stenosis measurements (when applicable) are obtained utilizing NASCET criteria, using the distal internal carotid diameter as the denominator. Multiphase CT imaging of the brain was performed following IV bolus contrast injection. Subsequent parametric perfusion maps were calculated using RAPID software. CONTRAST:  125mL ISOVUE-370 IOPAMIDOL (ISOVUE-370) INJECTION 76% COMPARISON:  CT head without contrast from the same day. MRI brain and MRA circle-of-Willis 01/23/2018. FINDINGS: CTA NECK FINDINGS Aortic arch: A 3 vessel arch configuration is present. Extensive atherosclerotic calcifications are present without a significant stenosis of the great vessel  origins. There is no  aneurysm. Right carotid system: Atherosclerotic calcifications are present along the wall of the right common carotid artery without a significant stenosis. There is calcified and noncalcified plaque through the right carotid bifurcation. There is no focal luminal stenosis relative to the more distal vessel. The cervical right ICA is within normal limits. Left carotid system: The left common carotid artery demonstrates mural calcifications without a significant luminal stenosis. Dense calcifications are present at the left carotid bifurcation. The lumen is narrowed to less than 1 mm. Additional distal calcifications are present in the proximal left ICA without a significant tandem stenosis. Vertebral arteries: The right vertebral artery originates from the subclavian artery without significant stenosis. There is some tortuosity and calcification of the cervical right ICA without a significant stenosis. The left vertebral artery is occluded below the C1-2 level. Skeleton: Multilevel degenerative changes are noted in the cervical spine. Degenerative anterolisthesis is present at C3-4 and to a greater extent at C4-5. There is chronic loss of disc space at C5-6 and C6-7. No focal lytic or blastic lesions are present. Alignment is otherwise anatomic. Other neck: The soft tissues the neck are otherwise unremarkable. There is no significant adenopathy. Thyroid is within normal limits. Salivary glands are unremarkable. Upper chest: Mild dependent atelectasis is present. The upper lung fields are otherwise clear without focal nodule, mass, or airspace disease. Ill-defined density in the left upper lobe is stable, likely remote scarring. Review of the MIP images confirms the above findings CTA HEAD FINDINGS Anterior circulation: Atherosclerotic calcifications are present through the cavernous internal carotid arteries bilaterally. There is no significant luminal stenosis relative to the ICA termini. The right A1 segment is  dominant. There is mild narrowing of the proximal left M1 segment. The middle right M2 branch is occluded. There is distal left MCA atherosclerotic disease without focal stenosis or occlusion. Significant attenuation of branch vessels in the distal right MCA territory. No significant collaterals are present. Posterior circulation: The left vertebral artery is reconstituted at C1. Dense calcification is present at the dural margin of the vertebral artery on the left. The distal vertebral artery is opacified. The right vertebral artery is within normal limits. PICA origin is high. Basilar artery is within normal limits. Both posterior cerebral arteries originate from the basilar tip. There is mild distal small vessel disease without a significant proximal stenosis or occlusion. Venous sinuses: The dural sinuses are patent. The right transverse sinus is dominant. The straight sinus and deep cerebral veins are intact. Cortical veins are unremarkable. Anatomic variants: None Delayed phase: Postcontrast images demonstrate early loss of gray-white differentiation in the anterior right frontal lobe. Review of the MIP images confirms the above findings CT Brain Perfusion Findings: CBF (<30%) Volume: 72mL Perfusion (Tmax>6.0s) volume: 157mL Mismatch Volume: 77mL Infarction Location:Right frontal lobe IMPRESSION: 1. Acute infarct of the right frontal lobe. Core infarct volume is 91 mL. 2. Occluded right M2 branch at the MCA bifurcation without distal reconstitution or collateral vessels. This corresponds to the infarct territory 3. Mild narrowing of the left M1 segment as described. 4. Diffuse distal small vessel disease without other significant proximal stenosis or occlusion in the circle-of-Willis. 5. High-grade stenosis of the left internal carotid artery at the bifurcation. 6. Atherosclerotic changes involving the right carotid bifurcation without significant stenosis. 7. Extensive atherosclerotic disease at the aortic  arch without significant stenosis. 8. 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 It is reconstituted at C1-2 level. 9. Multilevel degenerative changes in the cervical spine These results  were called by telephone at the time of interpretation on 06/21/2018 at 12:52 pm to Dr. Alexis Goodell , who verbally acknowledged these results. Electronically Signed   By: San Morelle M.D.   On: 06/21/2018 13:01   Ct Head Code Stroke Wo Contrast  Result Date: 06/21/2018 CLINICAL DATA:  Code stroke. Focal neuro deficit, less than 6 hours, stroke suspected. Patient found unresponsive 1 hour ago. Acute onset of left-sided weakness and facial droop with ice fixed to the right. EXAM: CT HEAD WITHOUT CONTRAST TECHNIQUE: Contiguous axial images were obtained from the base of the skull through the vertex without intravenous contrast. COMPARISON:  CT head without contrast 02/22/2018 FINDINGS: Brain: Atrophy and moderate diffuse white matter changes are again seen bilaterally. No acute cortical infarct, hemorrhage, or mass lesion is present. The basal ganglia are intact. Insular ribbon is normal bilaterally. No acute or focal right-sided infarct is present. The ventricles are proportionate to the degree of atrophy and stable. The brainstem and cerebellum are normal. Vascular: Atherosclerotic calcifications are present in the cavernous and precavernous internal carotid arteries. There is no hyperdense vessel. Skull: Calvarium is intact. No focal lytic or blastic lesions are present. Sinuses/Orbits: Paranasal sinuses and mastoid air cells are clear. Bilateral lens replacements are present. Globes and orbits are otherwise within normal limits. ASPECTS [facility] Stroke Program Early CT Score) - Ganglionic level infarction (caudate, lentiform nuclei, internal capsule, insula, M1-M3 cortex): 7/7 - Supraganglionic infarction (M4-M6 cortex): 3/3 Total score (0-10 with 10 being normal): 10/10 IMPRESSION: 1. No acute intracranial abnormality. 2.  ASPECTS is 10/10 3. Moderate atrophy and white matter disease is stable bilaterally. These results were called by telephone at the time of interpretation on 06/21/2018 at 11:03 am to Dr. Harvest Dark , who verbally acknowledged these results. Electronically Signed   By: San Morelle M.D.   On: 06/21/2018 11:05   Transthoracic Echocardiogram  06/22/2018 Study Conclusions - Left ventricle: The cavity size was normal. Systolic function was   vigorous. The estimated ejection fraction was in the range of 65%   to 70%. Wall motion was normal; there were no regional wall   motion abnormalities. Features are consistent with a pseudonormal   left ventricular filling pattern, with concomitant abnormal   relaxation and increased filling pressure (grade 2 diastolic   dysfunction). Doppler parameters are consistent with high   ventricular filling pressure. - Aortic valve: Poorly visualized. Severely calcified annulus.   Trileaflet; moderately thickened, moderately calcified leaflets. - Mitral valve: Severely calcified annulus. Moderately thickened,   mildly calcified leaflets anterior. There was mild to moderate   regurgitation. - Atrial septum: There was increased thickness of the septum,   consistent with lipomatous hypertrophy. - Tricuspid valve: There was mild regurgitation. - Pulmonic valve: There was mild regurgitation.   PHYSICAL EXAM Vitals:   06/25/18 0715 06/25/18 0720 06/25/18 0800 06/25/18 1024  BP: (!) 166/65   (!) 149/54  Pulse: 74   77  Resp: 14     Temp:   97.7 F (36.5 C)   TempSrc:   Axillary   SpO2: 96% 93%    Weight:      Height:        Temp:  [97.5 F (36.4 C)-98 F (36.7 C)] 97.7 F (36.5 C) (07/30 0800) Pulse Rate:  [69-91] 77 (07/30 1024) Resp:  [14-19] 14 (07/30 0715) BP: (127-200)/(44-128) 149/54 (07/30 1024) SpO2:  [89 %-100 %] 93 % (07/30 0720) Weight:  [104 lb 8 oz (47.4 kg)] 104 lb 8 oz (  47.4 kg) (07/29 1626)  General -cachectic, well  developed, in no apparent distress.  Ophthalmologic - fundi not visualized due to noncooperation.  Cardiovascular - Regular rate and rhythm, not in A. fib.  Neuro -awake, alert, eyes open, orientated to place, but not orientated to people and time.  Able to follow simple command on the left, severe dysarthria and hypophonia.  Right gaze preference, however able to cross midline, PERRL, not blinking to visual threat on the left.  Left facial droop mild, tongue midline.  Left upper extremity 1/5 with pain summation, left lower extremity 0/5 proximal and 2/5 distally.  Right upper and lower extremity 3/5.  DTR 1+, no Babinski. Sensation, coordination not cooperative and gait not tested.   ASSESSMENT/PLAN Dawn [name] is a 84 y.o. female with history of LLE PVD with stenting, hip surgery, TIA, cancer, and HTN presenting with left sided weakness. The patient received IV TPA at [facility] Friday AM 06/21/2018.  Stroke: Large right MCA infarct - embolic due to A. fib/aflutter not on AC.  Resultant  Left side hemiplegia, right gaze preference  CT head - No acute intracranial abnormality.   CTA H&N - Occluded right M2 branch. High-grade stenosis of the left internal carotid furcation  CT perfusion - large infarct core   MRI head - not performed due to "metal in body" as per family  2D Echo - EF 65% to 70%. No cardiac source of emboli identified.  LDL 42  HgbA1c - 5.7  VTE prophylaxis - subq heparin  Diet n.p.o.  aspirin 81 mg daily and clopidogrel 75 mg daily prior to admission, now aspirin 325.  Will start anticoagulation in 7 to 10 days given large size of stroke  Ongoing aggressive stroke risk factor management  Therapy recommendations:  SNF  Disposition:  Pending  A. fib/a flutter not on AC  As per family, patient has diagnosed with A. fib in the past not on AC, but on aspirin and Plavix  Again confirmed in ICU  On aspirin 325  On Metoprolol  Rate  controlled  We will start anticoagulation in 7 to 10 days given large size of the stroke  Hypertension  Stable  Off cleviprex . SBP < 160  . Resume home BP medications including Norvasc and clonidine.  Added metoprolol . Long-term BP goal normotensive  Dysphagia  Did not pass swallow  On cor Trak for tube feeding  will need PEG tube on SNF placement  Trauma team consulted  Speech on board  NT suctioning   Mucomyst neb  Other Stroke Risk Factors  Advanced age  Former cigarette smoker - quit  Other Active Problems  Elevated creatinine 1.10 -> 1.04 -> 1.22->1.10  Hypokalemia - supplement 3.2 -> 3.6->2.9   Dietitian consult 06/23/2018 - Severe malnutrition in context of chronic illness.  Palliative care on board  Hospital day # 4  This patient is critically ill due to large right MCA stroke, A. fib with RVR, dysphasia and at significant risk of neurological worsening, death form recurrent stroke, hemorrhagic conversion, heart failure, aspiration pneumonia. This patient's care requires constant monitoring of vital signs, hemodynamics, respiratory and cardiac monitoring, review of multiple databases, neurological assessment, discussion with family, other specialists and medical decision making of high complexity. I spent 35 minutes of neurocritical care time in the care of this patient.   Rosalin Hawking, MD PhD Stroke Neurology 06/25/2018 11:51 AM  To contact Stroke Continuity provider, please refer to [email or web address] After hours, contact General  Neurology

## 2018-06-25 NOTE — Progress Notes (Signed)
RT NT suctioned pt x1 through R Nare with small amount of yellow/white thick secretions removed. RT also deep suctioned pt through back of throat with small amount of yellow very thick secretions removed. Pt tolerated fairly well. No Complications noted. VS within normal limits. RT will continue to monitor for possible future suction needs.

## 2018-06-25 NOTE — Progress Notes (Signed)
Occupational Therapy Treatment Patient Details Name: Dawn [name] MRN: [id number] DOB: 01-10-1928 Today's Date: 06/25/2018    History of present illness 84 y.o. female with PMH: HTN, cancer, hip arthroplasty who presesnted to [facility] ER with sudden onset left-sided weakness. CT Head positive for non-hemorrhagic right MCA territory infarct.   OT comments  Pt able to perform stand pivot transfer OOB to chair today with max assist +2. Pt able to perform grooming task x1 with R hand seated at EOB with min assist for sitting balance. Pt following all one step commands appropriately and answering questions with head nods and occasional verbalizations. D/c plan remains appropriate. Will continue to follow acutely.   Follow Up Recommendations  SNF;Supervision/Assistance - 24 hour    Equipment Recommendations  None recommended by OT    Recommendations for Other Services      Precautions / Restrictions Precautions Precautions: Fall Restrictions Weight Bearing Restrictions: No       Mobility Bed Mobility Overal bed mobility: Needs Assistance Bed Mobility: Supine to Sit;Sit to Supine     Supine to sit: Max assist;+2 for physical assistance Sit to supine: Max assist;+2 for physical assistance   General bed mobility comments: +2 max assist to come to EOB, patient was able to move RLE to EOB with cues and required assist for LLE and elevation of trunk to upright. Max assist to return to supine and reposition in bed  Transfers Overall transfer level: Needs assistance Equipment used: 2 person hand held assist Transfers: Sit to/from [employer] Transfers Sit to Stand: Max assist;+2 physical assistance Stand pivot transfers: Max assist;+2 physical assistance       General transfer comment: Patient able to hold to therapist arms and power up with RLE during transition, maxx assist of two persons with face to face and chuck pad to complete transfer    Balance  Overall balance assessment: Needs assistance Sitting-balance support: Feet unsupported Sitting balance-Leahy Scale: Poor Sitting balance - Comments: continues to require hands on physical assist to maintain sitting balance at EOB.  Attempts to release UE from bed to use tissue, with noted instability at trunk   Standing balance support: Bilateral upper extremity supported Standing balance-Leahy Scale: Zero                             ADL either performed or assessed with clinical judgement   ADL Overall ADL's : Needs assistance/impaired     Grooming: Minimal assistance;Sitting Grooming Details (indicate cue type and reason): Min assist for sitting balance. Used R hand to wipe mouth multiple times with tissues             Lower Body Dressing: Total assistance;Bed level Lower Body Dressing Details (indicate cue type and reason): to don/doff socks             Functional mobility during ADLs: Maximal assistance;+2 for physical assistance(for stand pivot only)       Vision       Perception     Praxis      Cognition Arousal/Alertness: Awake/alert   Overall Cognitive Status: Impaired/Different from baseline Area of Impairment: Following commands;Awareness;Safety/judgement;Problem solving;Attention                   Current Attention Level: Sustained   Following Commands: Follows one step commands with increased time;Follows one step commands consistently Safety/Judgement: Decreased awareness of safety;Decreased awareness of deficits Awareness: Intellectual Problem Solving: Slow processing;Requires verbal  cues;Requires tactile cues          Exercises     Shoulder Instructions       General Comments son present throughout session    Pertinent Vitals/ Pain       Pain Assessment: Faces Faces Pain Scale: Hurts a little bit Pain Location: general discomfort Pain Intervention(s): Monitored during session;Repositioned  Home Living                                           Prior Functioning/Environment              Frequency  Min 2X/week        Progress Toward Goals  OT Goals(current goals can now be found in the care plan section)  Progress towards OT goals: Progressing toward goals  Acute Rehab OT Goals Patient Stated Goal: to get better OT Goal Formulation: With patient/family  Plan Discharge plan remains appropriate    Co-evaluation    PT/OT/SLP Co-Evaluation/Treatment: Yes Reason for Co-Treatment: Complexity of the patient's impairments (multi-system involvement) PT goals addressed during session: Mobility/safety with mobility OT goals addressed during session: ADL's and self-care      AM-PAC PT "6 Clicks" Daily Activity     Outcome Measure   Help from another person eating meals?: Total Help from another person taking care of personal grooming?: A Little Help from another person toileting, which includes using toliet, bedpan, or urinal?: A Lot Help from another person bathing (including washing, rinsing, drying)?: A Lot Help from another person to put on and taking off regular upper body clothing?: A Lot Help from another person to put on and taking off regular lower body clothing?: A Lot 6 Click Score: 12    End of Session    OT Visit Diagnosis: Unsteadiness on feet (R26.81);Other abnormalities of gait and mobility (R26.89);Muscle weakness (generalized) (M62.81);Other symptoms and signs involving the nervous system (R29.898);Hemiplegia and hemiparesis Hemiplegia - Right/Left: Left Hemiplegia - dominant/non-dominant: Non-Dominant Hemiplegia - caused by: Cerebral infarction   Activity Tolerance Patient tolerated treatment well   Patient Left in chair;with call bell/phone within reach;with family/visitor present   Nurse Communication Mobility status        Time: 1105-1130 OT Time Calculation (min): 25 min  Charges: OT General Charges $OT Visit: 1 Visit OT  Treatments $Self Care/Home Management : 8-22 mins  Chevelle Coulson A. Ulice Brilliant, M.S., OTR/L Acute Rehab Department: [phone]   Binnie Kand 06/25/2018, 1:49 PM

## 2018-06-26 DIAGNOSIS — R1312 Dysphagia, oropharyngeal phase: Secondary | ICD-10-CM

## 2018-06-26 DIAGNOSIS — E876 Hypokalemia: Secondary | ICD-10-CM

## 2018-06-26 DIAGNOSIS — I480 Paroxysmal atrial fibrillation: Secondary | ICD-10-CM

## 2018-06-26 LAB — CBC
HEMATOCRIT: 34.5 % — AB (ref 36.0–46.0)
Hemoglobin: 10.8 g/dL — ABNORMAL LOW (ref 12.0–15.0)
MCH: 28 pg (ref 26.0–34.0)
MCHC: 31.3 g/dL (ref 30.0–36.0)
MCV: 89.4 fL (ref 78.0–100.0)
PLATELETS: 329 10*3/uL (ref 150–400)
RBC: 3.86 MIL/uL — ABNORMAL LOW (ref 3.87–5.11)
RDW: 16.8 % — AB (ref 11.5–15.5)
WBC: 12.2 10*3/uL — ABNORMAL HIGH (ref 4.0–10.5)

## 2018-06-26 LAB — BASIC METABOLIC PANEL
Anion gap: 10 (ref 5–15)
BUN: 35 mg/dL — ABNORMAL HIGH (ref 8–23)
CALCIUM: 8.9 mg/dL (ref 8.9–10.3)
CO2: 23 mmol/L (ref 22–32)
CREATININE: 0.99 mg/dL (ref 0.44–1.00)
Chloride: 109 mmol/L (ref 98–111)
GFR calc non Af Amer: 50 mL/min — ABNORMAL LOW (ref >60)
GFR, EST AFRICAN AMERICAN: 58 mL/min — AB (ref >60)
GLUCOSE: 127 mg/dL — AB (ref 70–99)
Potassium: 5 mmol/L (ref 3.5–5.1)
Sodium: 142 mmol/L (ref 135–145)

## 2018-06-26 LAB — GLUCOSE, CAPILLARY
GLUCOSE-CAPILLARY: 109 mg/dL — AB (ref 70–99)
Glucose-Capillary: 135 mg/dL — ABNORMAL HIGH (ref 70–99)
Glucose-Capillary: 123 mg/dL — ABNORMAL HIGH (ref 70–99)
Glucose-Capillary: 123 mg/dL — ABNORMAL HIGH (ref 70–99)

## 2018-06-26 MED ORDER — ATORVASTATIN CALCIUM 10 MG PO TABS
20.0000 mg | ORAL_TABLET | Freq: Every day | ORAL | Status: DC
  Administered 2018-06-26 – 2018-07-03 (×6): 20 mg
  Filled 2018-06-26 (×7): qty 2

## 2018-06-26 MED ORDER — OSMOLITE 1.2 CAL PO LIQD
1000.0000 mL | ORAL | Status: DC
  Administered 2018-06-26 – 2018-07-04 (×8): 1000 mL
  Filled 2018-06-26 (×14): qty 1000

## 2018-06-26 MED ORDER — PANTOPRAZOLE SODIUM 40 MG PO PACK
40.0000 mg | PACK | Freq: Every day | ORAL | Status: DC
  Administered 2018-06-26 – 2018-07-04 (×8): 40 mg
  Filled 2018-06-26 (×10): qty 20

## 2018-06-26 MED ORDER — VITAMIN B-12 1000 MCG PO TABS
5000.0000 ug | ORAL_TABLET | Freq: Every day | ORAL | Status: DC
  Administered 2018-06-26 – 2018-07-04 (×8): 5000 ug
  Filled 2018-06-26 (×8): qty 5

## 2018-06-26 MED ORDER — FERROUS SULFATE 300 (60 FE) MG/5ML PO SYRP
300.0000 mg | ORAL_SOLUTION | Freq: Two times a day (BID) | ORAL | Status: DC
  Administered 2018-06-26 – 2018-07-04 (×15): 300 mg
  Filled 2018-06-26 (×20): qty 5

## 2018-06-26 MED ORDER — LABETALOL HCL 5 MG/ML IV SOLN
10.0000 mg | INTRAVENOUS | Status: DC | PRN
  Administered 2018-06-27: 10 mg via INTRAVENOUS
  Filled 2018-06-26 (×2): qty 4

## 2018-06-26 MED ORDER — CLONIDINE HCL 0.1 MG PO TABS
0.1000 mg | ORAL_TABLET | Freq: Two times a day (BID) | ORAL | Status: DC
  Administered 2018-06-26 – 2018-06-30 (×9): 0.1 mg
  Filled 2018-06-26 (×10): qty 1

## 2018-06-26 MED ORDER — ASPIRIN 325 MG PO TABS
325.0000 mg | ORAL_TABLET | Freq: Every day | ORAL | Status: DC
  Administered 2018-06-26 – 2018-07-01 (×5): 325 mg
  Filled 2018-06-26 (×5): qty 1

## 2018-06-26 MED ORDER — AMLODIPINE BESYLATE 10 MG PO TABS
10.0000 mg | ORAL_TABLET | Freq: Every day | ORAL | Status: DC
  Administered 2018-06-26 – 2018-07-04 (×8): 10 mg
  Filled 2018-06-26 (×8): qty 1

## 2018-06-26 MED ORDER — METOPROLOL TARTRATE 25 MG/10 ML ORAL SUSPENSION
25.0000 mg | Freq: Two times a day (BID) | ORAL | Status: DC
  Administered 2018-06-26 – 2018-07-01 (×10): 25 mg
  Filled 2018-06-26 (×13): qty 10

## 2018-06-26 NOTE — Progress Notes (Signed)
  Speech Language Pathology Treatment: Dysphagia  Patient Details Name: Dawn [name] MRN: [id number] DOB: [birth year] Today's Date: 06/26/2018 Time: 1340-1406 SLP Time Calculation (min) (ACUTE ONLY): 26 min  Assessment / Plan / Recommendation Clinical Impression  Treatment session focused on setting up basic aspiration precautions in new room, performing oral care, initiating EMST with blue respironics trainer set to lowest setting; 5 cm H20. Pt needed max tactile cues to achieve labial seal on device. She was able to achieve airflow through the device 3x out of 7 attempts. Provided education to son regarding the above. Will f/u at least 3 x a week, but son also capable of assisting pt with basic oral care and EMST.   HPI HPI: Patient is an 84 y.o. female with PMH: HTN, cancer, hip arthroplasty who presesnted to [facility] ER with sudden onset left-sided weakness while eating breakfast. At baseline, patient walks short distances with walker but is mostly wheelchair bound. She is able to feed herself but needs help with bathing and toileting. CT Head positive for non-hemorrhagic right MCA territory infarct.      SLP Plan  Continue with current plan of care       Recommendations  Diet recommendations: NPO                Oral Care Recommendations: Oral care QID Follow up Recommendations: Skilled Nursing facility Plan: Continue with current plan of care       GO               [name], MA CCC-SLP [phone]  Lynann Beaver 06/26/2018, 2:08 PM

## 2018-06-26 NOTE — Progress Notes (Signed)
   06/26/18 1500  Clinical Encounter Type  Visited With Patient and family together  Visit Type Follow-up  Referral From Palliative care team  Consult/Referral To Chaplain  Spiritual Encounters  Spiritual Needs Literature;Prayer  Stress Factors  Patient Stress Factors Major life changes  Family Stress Factors Health changes;Major life changes  Chaplain visited with the PT pastor and family.  The PT was alert and oriented and at the time the Chaplain visited the PT was getting a line put in.  Unable to get final sheet of the document notarized without witnesses.  Will follow up in the AM.

## 2018-06-26 NOTE — Progress Notes (Signed)
Nutrition Follow-up  DOCUMENTATION CODES:   Severe malnutrition in context of chronic illness, Underweight  INTERVENTION:   Continue TF via Cortrak: - Increase Osmolite 1.2 to 55 ml/hr (1320 ml/day) - 30 ml Pro-stat daily  New TF regimen provides 1684 kcal, 88 grams protein, and 1082 ml free water, meeting 100% of pt's estimated needs.  NUTRITION DIAGNOSIS:   Severe Malnutrition related to chronic illness (COPD, cancer, TIA) as evidenced by severe fat depletion, severe muscle depletion.  Ongoing  GOAL:   Patient will meet greater than or equal to 90% of their needs  Met via TF  MONITOR:   Weight trends, TF tolerance, I & O's, Labs, Diet advancement  REASON FOR ASSESSMENT:   Consult Enteral/tube feeding initiation and management  ASSESSMENT:   Patient with PMH HTN, Cancer, COPD, TIA, Hip arthoplasty who presented to [facility] with sudden onset left-sided weakness while eating breakfast. Patient received IV TPA at [facility]. Found to have acute ischemic stroke.  7/29 - Cortrak placed, tube feeding initiated 7/30 - FEES with recommendation for NPO  Discussed pt with RN and during ICU rounds. Per RN, pt is tolerating TF well.  Cortrak remains in L nare and secured with bridle. Osmolite 1.2 infusion @ 50 ml/hr via Cortrak at time of RD visit. Tube feeding is now at goal rate.  Palliative care team following pt. Noted family plans to pursue PEG "at an appropriate time" as pt's son has requested time for pt to improve prior to PEG placement. Per trauma note, PEG scheduled for 1200 on 06/27/18 with Dr. Hulen Skains.  Noted pt's weight is down 6 lbs since admission. RD to increase TF in an attempt to prevent further weight loss.  Medications reviewed and include: Pro-stat 30 ml daily, 300 mg ferrous sulfate BID, 40 mg Protonix daily, 5,000 mcg vitamin B-12 daily, Osmolite 1.2  IVF: NaCl @ 20 ml/hr  Labs reviewed: BUN 25 (H), hemoglobin 10.8 (L), HCT 34.5 (L) Potassium, phosphorus, and  magnesium WNL as of 06/25/18. CBG's: 123, 135, 124, 113, 130, 132 x 24 hours  UOP: 1000 ml x 24 hours I/O's: +1.2 L since admission  Diet Order:   Diet Order           Diet NPO time specified  Diet effective now          EDUCATION NEEDS:   No education needs have been identified at this time  Skin:  Skin Assessment: Skin Integrity Issues: Stage I: sacrum Stage II: buttocks  Last BM:  06/25/18  Height:   Ht Readings from Last 1 Encounters:  06/21/18 5' 7"  (1.702 m)    Weight:   Wt Readings from Last 1 Encounters:  06/25/18 105 lb 13.1 oz (48 kg)    Ideal Body Weight:  61.36 kg  BMI:  Body mass index is 16.57 kg/m.  Estimated Nutritional Needs:   Kcal:  1500-1700 kcal/day  Protein:  75-90 grams/day  Fluid:  1.5-1.7 L/day    Dawn Face, MS, RD, LDN Pager: [phone] Weekend/After Hours: [phone]

## 2018-06-26 NOTE — Clinical Social Work Note (Signed)
Clinical Social Work Assessment  Patient Details  Name: Dawn [name] MRN: [id number] Date of Birth: 1928-05-12  Date of referral:  06/25/18               Reason for consult:  Facility Placement                Permission sought to share information with:  Family Supports Permission granted to share information::  No  Name::     MIchael Debo Relationship::  Son / POA  Contact Information:  [phone]  Housing/Transportation Living arrangements for the past 2 months:  Single Family Home Source of Information:  Adult Children Patient Interpreter Needed:  None Criminal Activity/Legal Involvement Pertinent to Current Situation/Hospitalization:  No - Comment as needed Significant Relationships:  Adult Children Lives with:  Adult Children Do you feel safe going back to the place where you live?  Yes Need for family participation in patient care:  Yes (Comment)  Care giving concerns:  Patient son expressed concern regarding patient status for period of wellness for Medicare days to start over.  Patient son is confident that patient has remained out of SNF placement for greater than 60 days but unsure about hospitalizations.  CSW states that it will be addressed at time of bed search and further explored as needed.   Social Worker assessment / plan:  Holiday representative met with patient son at bedside to offer support and discuss patient needs at discharge.  Patient son states that patient is currently living at home with him, however has had several stays at SNF in the past.  Patient has been to [facility], [facility], [facility] and [facility].  Patient son has preference to return to [facility] and states that he will not allow patient to return to [facility].  CSW to await determination of PEG tube feeds prior to initiating bed search.  Patient son verbalized understanding and is aware that patient will likely transfer to the floor.  CSW  remains available for support and to facilitate patient discharge needs once medically stable.  Employment status:  Retired Forensic scientist:  Medicare PT Recommendations:  [place] / Referral to community resources:  [place]  Patient/Family's Response to care:  Patient son verbalized appreciation for CSW support and concern.  Patient son is reasonable and agreeable with SNF placement - patient son is concerned if patient has to return home due to inability for Medicare coverage.  Patient/Family's Understanding of and Emotional Response to Diagnosis, Current Treatment, and Prognosis:  Patient son understanding of patient current medical needs and SNF placement for continued rehab.  Patient family with some disconnect, however all attempting to act in the best interest of the patient.  Emotional Assessment Appearance:  Appears stated age Attitude/Demeanor/Rapport:  Unable to Assess Affect (typically observed):  Unable to Assess Orientation:  Oriented to Self, Oriented to Situation, Oriented to Place, Oriented to  Time Alcohol / Substance use:  Not Applicable Psych involvement (Current and /or in the community):  No (Comment)  Discharge Needs  Concerns to be addressed:  Discharge Planning Concerns Readmission within the last 30 days:  No Current discharge risk:  Physical Impairment Barriers to Discharge:  Continued Medical Work up  [employer], [place]

## 2018-06-26 NOTE — Progress Notes (Signed)
STROKE TEAM PROGRESS NOTE   SUBJECTIVE (INTERVAL HISTORY) Patient son is at bedside.  Patient awake alert today, able to follow limited commands, still severe dysarthria but able to make sounds out today. She did not pass swallow after MBS yesterday. Likely needs PEG to go to SNF, will call trauma surgery again.    OBJECTIVE Temp:  [97.1 F (36.2 C)-98.3 F (36.8 C)] 97.8 F (36.6 C) (07/31 0800) Pulse Rate:  [67-93] 83 (07/31 1000) Cardiac Rhythm: Normal sinus rhythm (07/31 1000) Resp:  [14-24] 20 (07/31 1000) BP: (113-182)/(49-97) 118/61 (07/31 1000) SpO2:  [93 %-99 %] 97 % (07/31 1000) Weight:  [105 lb 13.1 oz (48 kg)] 105 lb 13.1 oz (48 kg) (07/30 1500)  CBC:  Recent Labs  Lab 06/21/18 1102 06/26/18 0307  WBC 8.6 12.2*  NEUTROABS 7.2*  --   HGB 11.8* 10.8*  HCT 35.6 34.5*  MCV 87.0 89.4  PLT 250 858    Basic Metabolic Panel:  Recent Labs  Lab 06/24/18 1703 06/25/18 0412 06/26/18 0307  NA  --  142 142  K  --  2.9* 5.0  CL  --  109 109  CO2  --  22 23  GLUCOSE  --  127* 127*  BUN  --  40* 35*  CREATININE  --  1.10* 0.99  CALCIUM  --  8.7* 8.9  MG 2.2 2.2  --   PHOS 4.3 3.6  --     Lipid Panel:     Component Value Date/Time   CHOL 103 06/22/2018 0151   TRIG 67 06/22/2018 0151   HDL 48 06/22/2018 0151   CHOLHDL 2.1 06/22/2018 0151   VLDL 13 06/22/2018 0151   LDLCALC 42 06/22/2018 0151   HgbA1c:  Lab Results  Component Value Date   HGBA1C 5.7 (H) 06/22/2018   Urine Drug Screen:     Component Value Date/Time   LABOPIA NONE DETECTED 01/22/2018 1700   COCAINSCRNUR NONE DETECTED 01/22/2018 1700   LABBENZ NONE DETECTED 01/22/2018 1700   AMPHETMU NONE DETECTED 01/22/2018 1700   THCU NONE DETECTED 01/22/2018 1700   LABBARB NONE DETECTED 01/22/2018 1700    Alcohol Level     Component Value Date/Time   ETH <10 01/22/2018 2013    IMAGING  Ct Angio Head W Or Wo Contrast  Result Date: 06/21/2018 CLINICAL DATA:  Stroke, follow-up. Acute onset of  left-sided weakness and facial droop with ice fixed to the right. EXAM: CT ANGIOGRAPHY HEAD AND NECK CT PERFUSION BRAIN TECHNIQUE: Multidetector CT imaging of the head and neck was performed using the standard protocol during bolus administration of intravenous contrast. Multiplanar CT image reconstructions and MIPs were obtained to evaluate the vascular anatomy. Carotid stenosis measurements (when applicable) are obtained utilizing NASCET criteria, using the distal internal carotid diameter as the denominator. Multiphase CT imaging of the brain was performed following IV bolus contrast injection. Subsequent parametric perfusion maps were calculated using RAPID software. CONTRAST:  165mL ISOVUE-370 IOPAMIDOL (ISOVUE-370) INJECTION 76% COMPARISON:  CT head without contrast from the same day. MRI brain and MRA circle-of-Willis 01/23/2018. FINDINGS: CTA NECK FINDINGS Aortic arch: A 3 vessel arch configuration is present. Extensive atherosclerotic calcifications are present without a significant stenosis of the great vessel origins. There is no aneurysm. Right carotid system: Atherosclerotic calcifications are present along the wall of the right common carotid artery without a significant stenosis. There is calcified and noncalcified plaque through the right carotid bifurcation. There is no focal luminal stenosis relative to the more distal  vessel. The cervical right ICA is within normal limits. Left carotid system: The left common carotid artery demonstrates mural calcifications without a significant luminal stenosis. Dense calcifications are present at the left carotid bifurcation. The lumen is narrowed to less than 1 mm. Additional distal calcifications are present in the proximal left ICA without a significant tandem stenosis. Vertebral arteries: The right vertebral artery originates from the subclavian artery without significant stenosis. There is some tortuosity and calcification of the cervical right ICA without  a significant stenosis. The left vertebral artery is occluded below the C1-2 level. Skeleton: Multilevel degenerative changes are noted in the cervical spine. Degenerative anterolisthesis is present at C3-4 and to a greater extent at C4-5. There is chronic loss of disc space at C5-6 and C6-7. No focal lytic or blastic lesions are present. Alignment is otherwise anatomic. Other neck: The soft tissues the neck are otherwise unremarkable. There is no significant adenopathy. Thyroid is within normal limits. Salivary glands are unremarkable. Upper chest: Mild dependent atelectasis is present. The upper lung fields are otherwise clear without focal nodule, mass, or airspace disease. Ill-defined density in the left upper lobe is stable, likely remote scarring. Review of the MIP images confirms the above findings CTA HEAD FINDINGS Anterior circulation: Atherosclerotic calcifications are present through the cavernous internal carotid arteries bilaterally. There is no significant luminal stenosis relative to the ICA termini. The right A1 segment is dominant. There is mild narrowing of the proximal left M1 segment. The middle right M2 branch is occluded. There is distal left MCA atherosclerotic disease without focal stenosis or occlusion. Significant attenuation of branch vessels in the distal right MCA territory. No significant collaterals are present. Posterior circulation: The left vertebral artery is reconstituted at C1. Dense calcification is present at the dural margin of the vertebral artery on the left. The distal vertebral artery is opacified. The right vertebral artery is within normal limits. PICA origin is high. Basilar artery is within normal limits. Both posterior cerebral arteries originate from the basilar tip. There is mild distal small vessel disease without a significant proximal stenosis or occlusion. Venous sinuses: The dural sinuses are patent. The right transverse sinus is dominant. The straight sinus  and deep cerebral veins are intact. Cortical veins are unremarkable. Anatomic variants: None Delayed phase: Postcontrast images demonstrate early loss of gray-white differentiation in the anterior right frontal lobe. Review of the MIP images confirms the above findings CT Brain Perfusion Findings: CBF (<30%) Volume: 29mL Perfusion (Tmax>6.0s) volume: 153mL Mismatch Volume: 55mL Infarction Location:Right frontal lobe IMPRESSION: 1. Acute infarct of the right frontal lobe. Core infarct volume is 91 mL. 2. Occluded right M2 branch at the MCA bifurcation without distal reconstitution or collateral vessels. This corresponds to the infarct territory 3. Mild narrowing of the left M1 segment as described. 4. Diffuse distal small vessel disease without other significant proximal stenosis or occlusion in the circle-of-Willis. 5. High-grade stenosis of the left internal carotid artery at the bifurcation. 6. Atherosclerotic changes involving the right carotid bifurcation without significant stenosis. 7. Extensive atherosclerotic disease at the aortic arch without significant stenosis. 8. 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 It is reconstituted at C1-2 level. 9. Multilevel degenerative changes in the cervical spine These results were called by telephone at the time of interpretation on 06/21/2018 at 12:52 pm to Dr. Alexis Goodell , who verbally acknowledged these results. Electronically Signed   By: San Morelle M.D.   On: 06/21/2018 13:01   Ct Head Wo Contrast  Result Date: 06/22/2018  CLINICAL DATA:  Focal neuro deficit, less than 6 hours. TPA 24 hours ago. EXAM: CT HEAD WITHOUT CONTRAST TECHNIQUE: Contiguous axial images were obtained from the base of the skull through the vertex without intravenous contrast. COMPARISON:  CT head without contrast 06/21/2018. CTA head and neck and CT perfusion head 06/21/2018. FINDINGS: Brain: A large right MCA territory infarct corresponds to the area of core infarct on the CT  perfusion exam. There is involvement of the right frontal operculum and insular cortex. Basal ganglia are seen. Right ACA territory is spared. There is local mass effect with effacement of the adjacent sulci. There is no midline shift. There is partial effacement of the right lateral ventricle. Left lateral ventricle is within normal limits. Brainstem and cerebellum are normal. There is no hemorrhage into the infarct. Vascular: Atherosclerotic calcifications are present within the cavernous internal carotid arteries bilaterally. There is no hyperdense vessel. Skull: Calvarium is intact. No focal lytic or blastic lesions are present. Sinuses/Orbits: The paranasal sinuses and mastoid air cells are clear. Bilateral lens replacements are present. Globes and orbits are otherwise within normal limits. IMPRESSION: 1. Involving nonhemorrhagic right MCA territory infarct corresponds with the area predicted by CT perfusion. 2. No hemorrhage within the infarct. 3. Local mass effect without significant midline shift. 4. Generalized atrophy and white matter disease is otherwise stable. Electronically Signed   By: San Morelle M.D.   On: 06/22/2018 12:09   Ct Angio Neck W Or Wo Contrast  Result Date: 06/21/2018 CLINICAL DATA:  Stroke, follow-up. Acute onset of left-sided weakness and facial droop with ice fixed to the right. EXAM: CT ANGIOGRAPHY HEAD AND NECK CT PERFUSION BRAIN TECHNIQUE: Multidetector CT imaging of the head and neck was performed using the standard protocol during bolus administration of intravenous contrast. Multiplanar CT image reconstructions and MIPs were obtained to evaluate the vascular anatomy. Carotid stenosis measurements (when applicable) are obtained utilizing NASCET criteria, using the distal internal carotid diameter as the denominator. Multiphase CT imaging of the brain was performed following IV bolus contrast injection. Subsequent parametric perfusion maps were calculated using RAPID  software. CONTRAST:  169mL ISOVUE-370 IOPAMIDOL (ISOVUE-370) INJECTION 76% COMPARISON:  CT head without contrast from the same day. MRI brain and MRA circle-of-Willis 01/23/2018. FINDINGS: CTA NECK FINDINGS Aortic arch: A 3 vessel arch configuration is present. Extensive atherosclerotic calcifications are present without a significant stenosis of the great vessel origins. There is no aneurysm. Right carotid system: Atherosclerotic calcifications are present along the wall of the right common carotid artery without a significant stenosis. There is calcified and noncalcified plaque through the right carotid bifurcation. There is no focal luminal stenosis relative to the more distal vessel. The cervical right ICA is within normal limits. Left carotid system: The left common carotid artery demonstrates mural calcifications without a significant luminal stenosis. Dense calcifications are present at the left carotid bifurcation. The lumen is narrowed to less than 1 mm. Additional distal calcifications are present in the proximal left ICA without a significant tandem stenosis. Vertebral arteries: The right vertebral artery originates from the subclavian artery without significant stenosis. There is some tortuosity and calcification of the cervical right ICA without a significant stenosis. The left vertebral artery is occluded below the C1-2 level. Skeleton: Multilevel degenerative changes are noted in the cervical spine. Degenerative anterolisthesis is present at C3-4 and to a greater extent at C4-5. There is chronic loss of disc space at C5-6 and C6-7. No focal lytic or blastic lesions are present. Alignment is otherwise  anatomic. Other neck: The soft tissues the neck are otherwise unremarkable. There is no significant adenopathy. Thyroid is within normal limits. Salivary glands are unremarkable. Upper chest: Mild dependent atelectasis is present. The upper lung fields are otherwise clear without focal nodule, mass, or  airspace disease. Ill-defined density in the left upper lobe is stable, likely remote scarring. Review of the MIP images confirms the above findings CTA HEAD FINDINGS Anterior circulation: Atherosclerotic calcifications are present through the cavernous internal carotid arteries bilaterally. There is no significant luminal stenosis relative to the ICA termini. The right A1 segment is dominant. There is mild narrowing of the proximal left M1 segment. The middle right M2 branch is occluded. There is distal left MCA atherosclerotic disease without focal stenosis or occlusion. Significant attenuation of branch vessels in the distal right MCA territory. No significant collaterals are present. Posterior circulation: The left vertebral artery is reconstituted at C1. Dense calcification is present at the dural margin of the vertebral artery on the left. The distal vertebral artery is opacified. The right vertebral artery is within normal limits. PICA origin is high. Basilar artery is within normal limits. Both posterior cerebral arteries originate from the basilar tip. There is mild distal small vessel disease without a significant proximal stenosis or occlusion. Venous sinuses: The dural sinuses are patent. The right transverse sinus is dominant. The straight sinus and deep cerebral veins are intact. Cortical veins are unremarkable. Anatomic variants: None Delayed phase: Postcontrast images demonstrate early loss of gray-white differentiation in the anterior right frontal lobe. Review of the MIP images confirms the above findings CT Brain Perfusion Findings: CBF (<30%) Volume: 57mL Perfusion (Tmax>6.0s) volume: 155mL Mismatch Volume: 10mL Infarction Location:Right frontal lobe IMPRESSION: 1. Acute infarct of the right frontal lobe. Core infarct volume is 91 mL. 2. Occluded right M2 branch at the MCA bifurcation without distal reconstitution or collateral vessels. This corresponds to the infarct territory 3. Mild narrowing  of the left M1 segment as described. 4. Diffuse distal small vessel disease without other significant proximal stenosis or occlusion in the circle-of-Willis. 5. High-grade stenosis of the left internal carotid artery at the bifurcation. 6. Atherosclerotic changes involving the right carotid bifurcation without significant stenosis. 7. Extensive atherosclerotic disease at the aortic arch without significant stenosis. 8. 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 It is reconstituted at C1-2 level. 9. Multilevel degenerative changes in the cervical spine These results were called by telephone at the time of interpretation on 06/21/2018 at 12:52 pm to Dr. Alexis Goodell , who verbally acknowledged these results. Electronically Signed   By: San Morelle M.D.   On: 06/21/2018 13:01   Ct Cerebral Perfusion W Contrast  Result Date: 06/21/2018 CLINICAL DATA:  Stroke, follow-up. Acute onset of left-sided weakness and facial droop with ice fixed to the right. EXAM: CT ANGIOGRAPHY HEAD AND NECK CT PERFUSION BRAIN TECHNIQUE: Multidetector CT imaging of the head and neck was performed using the standard protocol during bolus administration of intravenous contrast. Multiplanar CT image reconstructions and MIPs were obtained to evaluate the vascular anatomy. Carotid stenosis measurements (when applicable) are obtained utilizing NASCET criteria, using the distal internal carotid diameter as the denominator. Multiphase CT imaging of the brain was performed following IV bolus contrast injection. Subsequent parametric perfusion maps were calculated using RAPID software. CONTRAST:  159mL ISOVUE-370 IOPAMIDOL (ISOVUE-370) INJECTION 76% COMPARISON:  CT head without contrast from the same day. MRI brain and MRA circle-of-Willis 01/23/2018. FINDINGS: CTA NECK FINDINGS Aortic arch: A 3 vessel arch configuration is present. Extensive  atherosclerotic calcifications are present without a significant stenosis of the great vessel origins.  There is no aneurysm. Right carotid system: Atherosclerotic calcifications are present along the wall of the right common carotid artery without a significant stenosis. There is calcified and noncalcified plaque through the right carotid bifurcation. There is no focal luminal stenosis relative to the more distal vessel. The cervical right ICA is within normal limits. Left carotid system: The left common carotid artery demonstrates mural calcifications without a significant luminal stenosis. Dense calcifications are present at the left carotid bifurcation. The lumen is narrowed to less than 1 mm. Additional distal calcifications are present in the proximal left ICA without a significant tandem stenosis. Vertebral arteries: The right vertebral artery originates from the subclavian artery without significant stenosis. There is some tortuosity and calcification of the cervical right ICA without a significant stenosis. The left vertebral artery is occluded below the C1-2 level. Skeleton: Multilevel degenerative changes are noted in the cervical spine. Degenerative anterolisthesis is present at C3-4 and to a greater extent at C4-5. There is chronic loss of disc space at C5-6 and C6-7. No focal lytic or blastic lesions are present. Alignment is otherwise anatomic. Other neck: The soft tissues the neck are otherwise unremarkable. There is no significant adenopathy. Thyroid is within normal limits. Salivary glands are unremarkable. Upper chest: Mild dependent atelectasis is present. The upper lung fields are otherwise clear without focal nodule, mass, or airspace disease. Ill-defined density in the left upper lobe is stable, likely remote scarring. Review of the MIP images confirms the above findings CTA HEAD FINDINGS Anterior circulation: Atherosclerotic calcifications are present through the cavernous internal carotid arteries bilaterally. There is no significant luminal stenosis relative to the ICA termini. The right A1  segment is dominant. There is mild narrowing of the proximal left M1 segment. The middle right M2 branch is occluded. There is distal left MCA atherosclerotic disease without focal stenosis or occlusion. Significant attenuation of branch vessels in the distal right MCA territory. No significant collaterals are present. Posterior circulation: The left vertebral artery is reconstituted at C1. Dense calcification is present at the dural margin of the vertebral artery on the left. The distal vertebral artery is opacified. The right vertebral artery is within normal limits. PICA origin is high. Basilar artery is within normal limits. Both posterior cerebral arteries originate from the basilar tip. There is mild distal small vessel disease without a significant proximal stenosis or occlusion. Venous sinuses: The dural sinuses are patent. The right transverse sinus is dominant. The straight sinus and deep cerebral veins are intact. Cortical veins are unremarkable. Anatomic variants: None Delayed phase: Postcontrast images demonstrate early loss of gray-white differentiation in the anterior right frontal lobe. Review of the MIP images confirms the above findings CT Brain Perfusion Findings: CBF (<30%) Volume: 17mL Perfusion (Tmax>6.0s) volume: 161mL Mismatch Volume: 89mL Infarction Location:Right frontal lobe IMPRESSION: 1. Acute infarct of the right frontal lobe. Core infarct volume is 91 mL. 2. Occluded right M2 branch at the MCA bifurcation without distal reconstitution or collateral vessels. This corresponds to the infarct territory 3. Mild narrowing of the left M1 segment as described. 4. Diffuse distal small vessel disease without other significant proximal stenosis or occlusion in the circle-of-Willis. 5. High-grade stenosis of the left internal carotid artery at the bifurcation. 6. Atherosclerotic changes involving the right carotid bifurcation without significant stenosis. 7. Extensive atherosclerotic disease at  the aortic arch without significant stenosis. 8. 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 It is reconstituted at  C1-2 level. 9. Multilevel degenerative changes in the cervical spine These results were called by telephone at the time of interpretation on 06/21/2018 at 12:52 pm to Dr. Alexis Goodell , who verbally acknowledged these results. Electronically Signed   By: San Morelle M.D.   On: 06/21/2018 13:01   Ct Head Code Stroke Wo Contrast  Result Date: 06/21/2018 CLINICAL DATA:  Code stroke. Focal neuro deficit, less than 6 hours, stroke suspected. Patient found unresponsive 1 hour ago. Acute onset of left-sided weakness and facial droop with ice fixed to the right. EXAM: CT HEAD WITHOUT CONTRAST TECHNIQUE: Contiguous axial images were obtained from the base of the skull through the vertex without intravenous contrast. COMPARISON:  CT head without contrast 02/22/2018 FINDINGS: Brain: Atrophy and moderate diffuse white matter changes are again seen bilaterally. No acute cortical infarct, hemorrhage, or mass lesion is present. The basal ganglia are intact. Insular ribbon is normal bilaterally. No acute or focal right-sided infarct is present. The ventricles are proportionate to the degree of atrophy and stable. The brainstem and cerebellum are normal. Vascular: Atherosclerotic calcifications are present in the cavernous and precavernous internal carotid arteries. There is no hyperdense vessel. Skull: Calvarium is intact. No focal lytic or blastic lesions are present. Sinuses/Orbits: Paranasal sinuses and mastoid air cells are clear. Bilateral lens replacements are present. Globes and orbits are otherwise within normal limits. ASPECTS [facility] Stroke Program Early CT Score) - Ganglionic level infarction (caudate, lentiform nuclei, internal capsule, insula, M1-M3 cortex): 7/7 - Supraganglionic infarction (M4-M6 cortex): 3/3 Total score (0-10 with 10 being normal): 10/10 IMPRESSION: 1. No acute intracranial  abnormality. 2. ASPECTS is 10/10 3. Moderate atrophy and white matter disease is stable bilaterally. These results were called by telephone at the time of interpretation on 06/21/2018 at 11:03 am to Dr. Harvest Dark , who verbally acknowledged these results. Electronically Signed   By: San Morelle M.D.   On: 06/21/2018 11:05   Transthoracic Echocardiogram  06/22/2018 Study Conclusions - Left ventricle: The cavity size was normal. Systolic function was   vigorous. The estimated ejection fraction was in the range of 65%   to 70%. Wall motion was normal; there were no regional wall   motion abnormalities. Features are consistent with a pseudonormal   left ventricular filling pattern, with concomitant abnormal   relaxation and increased filling pressure (grade 2 diastolic   dysfunction). Doppler parameters are consistent with high   ventricular filling pressure. - Aortic valve: Poorly visualized. Severely calcified annulus.   Trileaflet; moderately thickened, moderately calcified leaflets. - Mitral valve: Severely calcified annulus. Moderately thickened,   mildly calcified leaflets anterior. There was mild to moderate   regurgitation. - Atrial septum: There was increased thickness of the septum,   consistent with lipomatous hypertrophy. - Tricuspid valve: There was mild regurgitation. - Pulmonic valve: There was mild regurgitation.   PHYSICAL EXAM Vitals:   06/26/18 0736 06/26/18 0800 06/26/18 0900 06/26/18 1000  BP:  (!) 170/62 (!) 169/64 118/61  Pulse:  87 86 83  Resp:  (!) 23 (!) 22 20  Temp:  97.8 F (36.6 C)    TempSrc:  Oral    SpO2: 98% 98% 99% 97%  Weight:      Height:        Temp:  [97.1 F (36.2 C)-98.3 F (36.8 C)] 97.8 F (36.6 C) (07/31 0800) Pulse Rate:  [67-93] 83 (07/31 1000) Resp:  [14-24] 20 (07/31 1000) BP: (113-182)/(49-97) 118/61 (07/31 1000) SpO2:  [93 %-99 %] 97 % (  07/31 1000) Weight:  [105 lb 13.1 oz (48 kg)] 105 lb 13.1 oz (48 kg) (07/30  1500)  General - cachectic, well developed, in no apparent distress, mouth breathing.  Ophthalmologic - fundi not visualized due to noncooperation.  Cardiovascular - Regular rate and rhythm, not in A. fib.  Neuro - awake, alert, eyes open, orientated to place, but not orientated to people and time.  Able to follow simple command on the left, severe dysarthria and able to make sound out today but not sentences.  Right gaze preference, however able to cross midline, PERRL, not blinking to visual threat on the left.  Left facial droop mild, tongue midline.  Left upper extremity 1/5 with pain summation, left lower extremity 0/5 proximal and 2/5 distally.  Right upper and lower extremity 3/5. Diffuse ecchymoses BUEs. DTR 1+, no Babinski. Sensation, coordination not cooperative and gait not tested.   ASSESSMENT/PLAN Dawn [name] is a 84 y.o. female with history of LLE PVD with stenting, hip surgery, TIA, cancer, and HTN presenting with left sided weakness. The patient received IV TPA at [facility] Friday AM 06/21/2018.  Stroke: Large right MCA infarct - embolic due to A. fib/aflutter not on AC.  Resultant  Left side hemiplegia, right gaze preference  CT head - No acute intracranial abnormality.   CTA H&N - Occluded right M2 branch. High-grade stenosis of the left internal carotid furcation  CT perfusion - large infarct core   MRI head - not performed due to "metal in body" as per family  2D Echo - EF 65% to 70%. No cardiac source of emboli identified.  LDL 42  HgbA1c - 5.7  VTE prophylaxis - subq heparin  Diet n.p.o.  aspirin 81 mg daily and clopidogrel 75 mg daily prior to admission, now aspirin 325.  Will start anticoagulation at 7-10 days post stroke (around 06/29/18) given large size of stroke  Ongoing aggressive stroke risk factor management  Therapy recommendations:  SNF   Disposition:  Pending  A. fib/a flutter not on AC  As per family, patient has  diagnosed with A. fib in the past not on [facility], but on aspirin and Plavix  Again confirmed in ICU  On aspirin 325  On Metoprolol  Rate controlled  We will start anticoagulation at 7-10 days after stroke (around 06/29/18) given large size of the stroke  Hypertension  Stable on the high end  Off cleviprex . SBP < 160  . Resume home BP medications including Norvasc and clonidine.   . Added metoprolol, increase to 25mg  bid . Long-term BP goal normotensive  Dysphagia  Did not pass swallow  On cor Trak for tube feeding  Plan for PEG tube  Trauma team consulted  Speech on board  NT suctioning   Mucomyst neb  Other Stroke Risk Factors  Advanced age  Former cigarette smoker - quit  Other Active Problems  Elevated creatinine 1.10 -> 1.04 -> 1.22->1.10->0.99  Hypokalemia - supplement 3.2 -> 3.6->2.9-supplement->5.0  Leukocytosis - 8.6->12.2  Dietitian consult 06/23/2018 - Severe malnutrition in context of chronic illness.  Palliative care on board   Hospital day # 5  This patient is critically ill due to large right MCA stroke, A. fib with RVR, dysphagia and at significant risk of neurological worsening, death form recurrent stroke, hemorrhagic conversion, heart failure, aspiration pneumonia. This patient's care requires constant monitoring of vital signs, hemodynamics, respiratory and cardiac monitoring, review of multiple databases, neurological assessment, discussion with family, other specialists and medical  decision making of high complexity. I spent 35 minutes of neurocritical care time in the care of this patient.   Rosalin Hawking, MD PhD Stroke Neurology 06/26/2018 10:47 AM  To contact Stroke Continuity provider, please refer to [email or web address] After hours, contact General Neurology

## 2018-06-26 NOTE — Progress Notes (Addendum)
PEG scheduled for 1200 on 08/01 in ENDO with Dr. Hulen Skains.  Jackson Latino, [facility] Pager [phone]

## 2018-06-26 NOTE — Progress Notes (Addendum)
Daily Progress Note   Patient Name: Dawn [name]       Date: 06/26/2018 DOB: [birth year]  Age: 84 y.o. MRN#: [id number] Attending Physician: Rosalin Hawking, MD Primary Care Physician: Idelle Crouch, MD Admit Date: 06/21/2018  Reason for Consultation/Follow-up: Establishing goals of care and Psychosocial/spiritual support  Subjective: Seems even more awake and alert.  Denies discomfort.  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   The patient appears orientated and appropriate but is weak and very difficult to understand.   She is able to write a few words before the writing changes to scribble.  Speech evaluation yesterday showed that the patient cognitively attempts to initiate a swallow but the rest of the swallow mechanism does not seem to kick in a function appropriately.  Extremely high aspiration risk.  NPO recommended. I attempted to wet her tongue this am with a sponge (to remove some of the green color from the FEES) which resulted coughing.    Legrand Como arrived.  We discussed the FEES results, reviewed the CT head imaging, and completed an Advanced directive.  Sahira confirmed that Legrand Como is her HCPOA.   I provided Legrand Como reassurance that despite disagreement in his family - he is taking excellent care of his mother and making excellent decisions for her.    Assessment: Frail but alert 84 yo female post right MCA territory stroke.  Unable to move left arm or leg, high aspiration risk.   Patient Profile/HPI:  was admitted on 06/21/2018 with sudden onset of left sided weakness. She was noted to have new onset afib with RVR.  Imaging revealed a right MCA CVA.   She received IV TPA at [facility] and then was transferred to [facility] for  further evaluation and care.  She has received an echo which shows grade 2 diastolic dysfunction, and severely calcified aortic and mitral valves.  PT evaluation indicates that she needs 2+ assist with all mobility including rolling over in bed.  She has been having difficulty with secretions and has failed her swallow evaluation.   Length of Stay: 5  Current Medications: Scheduled Meds:  . acetylcysteine  3 mL Nebulization TID  . amLODipine  10 mg Per Tube Daily  . aspirin  325 mg Per Tube Daily  .  atorvastatin  20 mg Per Tube q1800  . chlorhexidine  15 mL Mouth Rinse BID  . cloNIDine  0.1 mg Per Tube BID  . feeding supplement (PRO-STAT SUGAR FREE 64)  30 mL Per Tube Daily  . ferrous sulfate  300 mg Per Tube BID WC  . glycopyrrolate  0.2 mg Intravenous TID  . heparin injection (subcutaneous)  5,000 Units Subcutaneous Q12H  . mouth rinse  15 mL Mouth Rinse q12n4p  . metoprolol tartrate  12.5 mg Per Tube BID  . pantoprazole sodium  40 mg Per Tube Daily  . vitamin B-12  5,000 mcg Per Tube Daily    Continuous Infusions: . sodium chloride 20 mL/hr at 06/26/18 0700  . clevidipine Stopped (06/25/18 0326)  . feeding supplement (OSMOLITE 1.2 CAL) 40 mL/hr at 06/26/18 0700    PRN Meds: acetaminophen, albuterol, bisacodyl, labetalol, senna-docusate  Physical Exam        Awake, alert, cooperative, leans right, n/g in place for feeding. CV rrr with sys murmur resp no distress Abdomen soft, nt,nd Ext.  Distal left upper extremity with 2+ swelling thru the fingers. Neuro:  Follows commands, speech dysarthric, unable to move left sided extremities but sensitivity in tact.  Vital Signs: BP (!) 176/62   Pulse 84   Temp 98.3 F (36.8 C) (Axillary)   Resp (!) 24   Ht 5\' 7"  (1.702 m)   Wt 48 kg (105 lb 13.1 oz)   SpO2 98%   BMI 16.57 kg/m  SpO2: SpO2: 98 % O2 Device: O2 Device: Room Air O2 Flow Rate: O2 Flow Rate (L/min): 2 L/min  Intake/output summary:   Intake/Output Summary  (Last 24 hours) at 06/26/2018 6010 Last data filed at 06/26/2018 0700 Gross per 24 hour  Intake 1409.95 ml  Output 1000 ml  Net 409.95 ml   LBM: Last BM Date: 06/25/18 Baseline Weight: Weight: 50.6 kg (111 lb 8.8 oz) Most recent weight: Weight: 48 kg (105 lb 13.1 oz)       Palliative Assessment/Data: 20%      Patient Active Problem List   Diagnosis Date Noted  . Goals of care, counseling/discussion   . Palliative care encounter   . Excessive oral secretions   . Acute right arterial ischemic stroke, middle cerebral artery (MCA) ([place]) 06/21/2018  . Symptomatic anemia 03/07/2018  . TIA (transient ischemic attack) 01/22/2018  . Pressure injury of skin 01/04/2018  . Sepsis ([place]) 01/03/2018  . Protein-calorie malnutrition, severe 11/14/2017  . Atherosclerosis of native arteries of extremity with rest pain ([place]) 11/09/2017  . Ischemic leg 11/09/2017  . Atherosclerotic peripheral vascular disease with rest pain ([place]) 11/09/2017  . Atypical chest pain 09/21/2017  . Renal artery stenosis ([place]) 06/15/2017  . Hyperlipidemia 05/09/2017  . Essential hypertension 10/17/2016  . Carotid stenosis 10/17/2016  . PVD (peripheral vascular disease) ([place]) 10/17/2016  . B12 deficiency 07/19/2016  . Vitamin D deficiency, unspecified 07/19/2016  . History of right hip replacement 11/04/2015  . Aftercare following bilateral hip joint replacement surgery 11/05/2014  . SCC (squamous cell carcinoma) 05/21/2014  . 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 S/P hip replacement 10/03/2012    Palliative Care Plan    Recommendations/Plan:  Advanced Directives completed - Chaplain requested to notarize.  Patient is DNR but full scope treatment  Family aware of poor prognosis  Plan for PEG at appropriate time Legrand Como requests time for his mother to improve before PEG).  Ronalee Belts will want extensive education about caring for PEG and using PEG.  D/C to SNF when ready - she has been to [facility] before and  would be comfortable there again.  Palliative to follow at SNF.  PMT will sign off.  Please call us back if needed for any reason.   Goals of Care and Additional Recommendations:  Limitations on Scope of Treatment: Full Scope Treatment  Code Status:  DNR  Prognosis:  Likely months given frailty, new immobility, recurrent aspiration, diastolic heart failure.  Discharge Planning:  [facility] for rehab with Palliative care service follow-up  Care plan was discussed with son  Thank you for allowing the Palliative Medicine Team to assist in the care of this patient.  Total time spent:  35 min.     Greater than 50%  of this time was spent counseling and coordinating care related to the above assessment and plan.  Florentina Jenny, PA-C Palliative Medicine  Please contact Palliative MedicineTeam phone at [phone] for questions and concerns between 7 am - 7 pm.   Please see AMION for individual provider pager numbers.

## 2018-06-27 ENCOUNTER — Inpatient Hospital Stay (HOSPITAL_COMMUNITY): Payer: Medicare Other

## 2018-06-27 LAB — CBC
HEMATOCRIT: 34.5 % — AB (ref 36.0–46.0)
Hemoglobin: 10.7 g/dL — ABNORMAL LOW (ref 12.0–15.0)
MCH: 27.7 pg (ref 26.0–34.0)
MCHC: 31 g/dL (ref 30.0–36.0)
MCV: 89.4 fL (ref 78.0–100.0)
PLATELETS: 302 10*3/uL (ref 150–400)
RBC: 3.86 MIL/uL — AB (ref 3.87–5.11)
RDW: 16.7 % — ABNORMAL HIGH (ref 11.5–15.5)
WBC: 13.5 10*3/uL — ABNORMAL HIGH (ref 4.0–10.5)

## 2018-06-27 LAB — GLUCOSE, CAPILLARY
GLUCOSE-CAPILLARY: 141 mg/dL — AB (ref 70–99)
GLUCOSE-CAPILLARY: 100 mg/dL — AB (ref 70–99)
Glucose-Capillary: 112 mg/dL — ABNORMAL HIGH (ref 70–99)
Glucose-Capillary: 114 mg/dL — ABNORMAL HIGH (ref 70–99)
Glucose-Capillary: 132 mg/dL — ABNORMAL HIGH (ref 70–99)
Glucose-Capillary: 137 mg/dL — ABNORMAL HIGH (ref 70–99)

## 2018-06-27 LAB — BASIC METABOLIC PANEL
ANION GAP: 9 (ref 5–15)
BUN: 38 mg/dL — AB (ref 8–23)
CHLORIDE: 105 mmol/L (ref 98–111)
CO2: 26 mmol/L (ref 22–32)
Calcium: 8.7 mg/dL — ABNORMAL LOW (ref 8.9–10.3)
Creatinine, Ser: 0.95 mg/dL (ref 0.44–1.00)
GFR calc Af Amer: >60 mL/min (ref >60)
GFR, EST NON AFRICAN AMERICAN: 52 mL/min — AB (ref >60)
GLUCOSE: 127 mg/dL — AB (ref 70–99)
POTASSIUM: 4.4 mmol/L (ref 3.5–5.1)
Sodium: 140 mmol/L (ref 135–145)

## 2018-06-27 MED ORDER — POLYETHYLENE GLYCOL 3350 17 G PO PACK: 17.0000 g | PACK | Freq: Every day | ORAL | Status: DC | PRN

## 2018-06-27 MED ORDER — POLYETHYLENE GLYCOL 3350 17 G PO PACK
17.0000 g | PACK | Freq: Once | ORAL | Status: AC
  Administered 2018-06-27: 17 g
  Filled 2018-06-27: qty 1

## 2018-06-27 MED ORDER — POTASSIUM CHLORIDE IN NACL 20-0.9 MEQ/L-% IV SOLN
INTRAVENOUS | Status: DC
  Administered 2018-06-28: via INTRAVENOUS
  Administered 2018-06-28: 1000 mL via INTRAVENOUS
  Administered 2018-06-30: via INTRAVENOUS
  Filled 2018-06-27 (×3): qty 1000

## 2018-06-27 MED ORDER — SODIUM CHLORIDE 0.9 % IV SOLN: INTRAVENOUS | Status: DC

## 2018-06-27 NOTE — Progress Notes (Signed)
Physical Therapy Treatment Patient Details Name: Dawn [name] MRN: [id number] DOB: 01-Oct-1928 Today's Date: 06/27/2018    History of Present Illness 83 y.o. female with PMH: HTN, cancer, hip arthroplasty who presesnted to [facility] ER with sudden onset left-sided weakness. CT Head positive for non-hemorrhagic right MCA territory infarct.    PT Comments    Patient seen for mobility progression and tolerated session well. This treatment focused on sitting balance and transfers. Pt requires max A +2 for bed mobility and OOB transfers. Son present throughout session and pt seems less anxious knowing he is present. VSS throughout. Continue to progress as tolerated with anticipated d/c to SNF for further skilled PT services.     Follow Up Recommendations  SNF;Supervision/Assistance - 24 hour(if progressing (may consider CIR for decr BOC))     Equipment Recommendations  None recommended by PT    Recommendations for Other Services       Precautions / Restrictions Precautions Precautions: Fall Restrictions Weight Bearing Restrictions: No    Mobility  Bed Mobility Overal bed mobility: Needs Assistance Bed Mobility: Supine to Sit;Sit to Supine     Supine to sit: Max assist;+2 for physical assistance Sit to supine: Max assist;+2 for physical assistance   General bed mobility comments: pt assisted minimally with R UE; cues for sequencing and assistance for all aspects of bed mobility; bed pad placed under bilat LE for assist to mobilize due to pain with tactile input   Transfers Overall transfer level: Needs assistance Equipment used: 2 person hand held assist(gait belt and bed pad) Transfers: Sit to/from Stand;Stand Pivot Transfers Sit to Stand: Max assist;+2 physical assistance Stand pivot transfers: Max assist;+2 physical assistance       General transfer comment: cues for hand placement and anterior translation of trunk and sequencing; assistance to stand and  pivot with use of gait belt and bed pad  Ambulation/Gait                 Stairs             Wheelchair Mobility    Modified Rankin (Stroke Patients Only) Modified Rankin (Stroke Patients Only) Pre-Morbid Rankin Score: Moderately severe disability Modified Rankin: Severe disability     Balance Overall balance assessment: Needs assistance Sitting-balance support: Feet supported;Single extremity supported Sitting balance-Leahy Scale: Poor     Standing balance support: Bilateral upper extremity supported Standing balance-Leahy Scale: Zero                              Cognition Arousal/Alertness: Awake/alert Behavior During Therapy: Flat affect(answers questions appropriately) Overall Cognitive Status: Impaired/Different from baseline Area of Impairment: Following commands;Awareness;Safety/judgement;Problem solving;Attention;Orientation;Memory                 Orientation Level: Disoriented to;Place;Time Current Attention Level: Sustained Memory: Decreased short-term memory Following Commands: Follows one step commands with increased time;Follows one step commands consistently Safety/Judgement: Decreased awareness of safety;Decreased awareness of deficits Awareness: Intellectual Problem Solving: Slow processing;Requires verbal cues;Requires tactile cues;Decreased initiation        Exercises      General Comments General comments (skin integrity, edema, etc.): pt's son present      Pertinent Vitals/Pain Pain Assessment: Faces Faces Pain Scale: Hurts little more Pain Location: bilat LE and R UE with tactile input Pain Intervention(s): Limited activity within patient's tolerance;Monitored during session;Repositioned    Home Living  Prior Function            PT Goals (current goals can now be found in the care plan section) Acute Rehab PT Goals PT Goal Formulation: With family Time For Goal Achievement:  07/07/18 Potential to Achieve Goals: Fair Progress towards PT goals: Progressing toward goals    Frequency    Min 3X/week      PT Plan Current plan remains appropriate    Co-evaluation              AM-PAC PT "6 Clicks" Daily Activity  Outcome Measure  Difficulty turning over in bed (including adjusting bedclothes, sheets and blankets)?: Unable Difficulty moving from lying on back to sitting on the side of the bed? : Unable Difficulty sitting down on and standing up from a chair with arms (e.g., wheelchair, bedside commode, etc,.)?: Unable Help needed moving to and from a bed to chair (including a wheelchair)?: Total Help needed walking in hospital room?: Total Help needed climbing 3-5 steps with a railing? : Total 6 Click Score: 6    End of Session Equipment Utilized During Treatment: Gait belt Activity Tolerance: Treatment limited secondary to medical complications (Comment) Patient left: with call bell/phone within reach;with family/visitor present;in chair;with chair alarm set Nurse Communication: Mobility status PT Visit Diagnosis: Other symptoms and signs involving the nervous system (R29.898);Hemiplegia and hemiparesis Hemiplegia - Right/Left: Left Hemiplegia - dominant/non-dominant: Non-dominant     Time: 1026-1100 PT Time Calculation (min) (ACUTE ONLY): 34 min  Charges:  $Therapeutic Activity: 23-37 mins                     Earney Navy, PTA Pager: [phone]     Darliss Cheney 06/27/2018, 1:38 PM

## 2018-06-27 NOTE — Progress Notes (Signed)
Tube feeds were not held overnight so due to aspiration risk PEG has been pushed back to 1000 Friday 08/02 in Endo.  Jackson Latino, [facility] Surgery Pager [phone]

## 2018-06-27 NOTE — Progress Notes (Signed)
Assisted Patient and Patient's son to complete HCPOA. Original and two copies given to patient and son and one copy given to nursing secretary to put in chart.   Sue Lush.

## 2018-06-27 NOTE — Progress Notes (Addendum)
STROKE TEAM PROGRESS NOTE   SUBJECTIVE (INTERVAL HISTORY) Patient son is at bedside along with 2 other ladies and her RN. Patient up in chair, awake and alert, able to follow limited commands, still severe dysarthria but able to make sounds out today. She did not pass swallow after MBS yesterday. PEG re-scheduled for tomorrow. Medically ready for SNF once stable post PEG. Discussed plans with pt, son and case Freight forwarder.   OBJECTIVE Temp:  [97.5 F (36.4 C)-98.7 F (37.1 C)] 98.3 F (36.8 C) (08/01 0841) Pulse Rate:  [79-99] 99 (08/01 0841) Cardiac Rhythm: Normal sinus rhythm (08/01 0712) Resp:  [15-21] 21 (07/31 2124) BP: (118-168)/(51-85) 168/64 (08/01 0307) SpO2:  [95 %-98 %] 97 % (08/01 0841)  CBC:  Recent Labs  Lab 06/21/18 1102 06/26/18 0307 06/27/18 0521  WBC 8.6 12.2* 13.5*  NEUTROABS 7.2*  --   --   HGB 11.8* 10.8* 10.7*  HCT 35.6 34.5* 34.5*  MCV 87.0 89.4 89.4  PLT 250 329 480    Basic Metabolic Panel:  Recent Labs  Lab 06/24/18 1703 06/25/18 0412 06/26/18 0307 06/27/18 0521  NA  --  142 142 140  K  --  2.9* 5.0 4.4  CL  --  109 109 105  CO2  --  22 23 26   GLUCOSE  --  127* 127* 127*  BUN  --  40* 35* 38*  CREATININE  --  1.10* 0.99 0.95  CALCIUM  --  8.7* 8.9 8.7*  MG 2.2 2.2  --   --   PHOS 4.3 3.6  --   --     Lipid Panel:     Component Value Date/Time   CHOL 103 06/22/2018 0151   TRIG 67 06/22/2018 0151   HDL 48 06/22/2018 0151   CHOLHDL 2.1 06/22/2018 0151   VLDL 13 06/22/2018 0151   LDLCALC 42 06/22/2018 0151   HgbA1c:  Lab Results  Component Value Date   HGBA1C 5.7 (H) 06/22/2018   Urine Drug Screen:     Component Value Date/Time   LABOPIA NONE DETECTED 01/22/2018 1700   COCAINSCRNUR NONE DETECTED 01/22/2018 1700   LABBENZ NONE DETECTED 01/22/2018 1700   AMPHETMU NONE DETECTED 01/22/2018 1700   THCU NONE DETECTED 01/22/2018 1700   LABBARB NONE DETECTED 01/22/2018 1700    Alcohol Level     Component Value Date/Time   ETH <10  01/22/2018 2013    IMAGING  Ct Angio Head W Or Wo Contrast  Result Date: 06/21/2018 CLINICAL DATA:  Stroke, follow-up. Acute onset of left-sided weakness and facial droop with ice fixed to the right. EXAM: CT ANGIOGRAPHY HEAD AND NECK CT PERFUSION BRAIN TECHNIQUE: Multidetector CT imaging of the head and neck was performed using the standard protocol during bolus administration of intravenous contrast. Multiplanar CT image reconstructions and MIPs were obtained to evaluate the vascular anatomy. Carotid stenosis measurements (when applicable) are obtained utilizing NASCET criteria, using the distal internal carotid diameter as the denominator. Multiphase CT imaging of the brain was performed following IV bolus contrast injection. Subsequent parametric perfusion maps were calculated using RAPID software. CONTRAST:  110mL ISOVUE-370 IOPAMIDOL (ISOVUE-370) INJECTION 76% COMPARISON:  CT head without contrast from the same day. MRI brain and MRA circle-of-Willis 01/23/2018. FINDINGS: CTA NECK FINDINGS Aortic arch: A 3 vessel arch configuration is present. Extensive atherosclerotic calcifications are present without a significant stenosis of the great vessel origins. There is no aneurysm. Right carotid system: Atherosclerotic calcifications are present along the wall of the right common carotid  artery without a significant stenosis. There is calcified and noncalcified plaque through the right carotid bifurcation. There is no focal luminal stenosis relative to the more distal vessel. The cervical right ICA is within normal limits. Left carotid system: The left common carotid artery demonstrates mural calcifications without a significant luminal stenosis. Dense calcifications are present at the left carotid bifurcation. The lumen is narrowed to less than 1 mm. Additional distal calcifications are present in the proximal left ICA without a significant tandem stenosis. Vertebral arteries: The right vertebral artery  originates from the subclavian artery without significant stenosis. There is some tortuosity and calcification of the cervical right ICA without a significant stenosis. The left vertebral artery is occluded below the C1-2 level. Skeleton: Multilevel degenerative changes are noted in the cervical spine. Degenerative anterolisthesis is present at C3-4 and to a greater extent at C4-5. There is chronic loss of disc space at C5-6 and C6-7. No focal lytic or blastic lesions are present. Alignment is otherwise anatomic. Other neck: The soft tissues the neck are otherwise unremarkable. There is no significant adenopathy. Thyroid is within normal limits. Salivary glands are unremarkable. Upper chest: Mild dependent atelectasis is present. The upper lung fields are otherwise clear without focal nodule, mass, or airspace disease. Ill-defined density in the left upper lobe is stable, likely remote scarring. Review of the MIP images confirms the above findings CTA HEAD FINDINGS Anterior circulation: Atherosclerotic calcifications are present through the cavernous internal carotid arteries bilaterally. There is no significant luminal stenosis relative to the ICA termini. The right A1 segment is dominant. There is mild narrowing of the proximal left M1 segment. The middle right M2 branch is occluded. There is distal left MCA atherosclerotic disease without focal stenosis or occlusion. Significant attenuation of branch vessels in the distal right MCA territory. No significant collaterals are present. Posterior circulation: The left vertebral artery is reconstituted at C1. Dense calcification is present at the dural margin of the vertebral artery on the left. The distal vertebral artery is opacified. The right vertebral artery is within normal limits. PICA origin is high. Basilar artery is within normal limits. Both posterior cerebral arteries originate from the basilar tip. There is mild distal small vessel disease without a  significant proximal stenosis or occlusion. Venous sinuses: The dural sinuses are patent. The right transverse sinus is dominant. The straight sinus and deep cerebral veins are intact. Cortical veins are unremarkable. Anatomic variants: None Delayed phase: Postcontrast images demonstrate early loss of gray-white differentiation in the anterior right frontal lobe. Review of the MIP images confirms the above findings CT Brain Perfusion Findings: CBF (<30%) Volume: 81mL Perfusion (Tmax>6.0s) volume: 192mL Mismatch Volume: 54mL Infarction Location:Right frontal lobe IMPRESSION: 1. Acute infarct of the right frontal lobe. Core infarct volume is 91 mL. 2. Occluded right M2 branch at the MCA bifurcation without distal reconstitution or collateral vessels. This corresponds to the infarct territory 3. Mild narrowing of the left M1 segment as described. 4. Diffuse distal small vessel disease without other significant proximal stenosis or occlusion in the circle-of-Willis. 5. High-grade stenosis of the left internal carotid artery at the bifurcation. 6. Atherosclerotic changes involving the right carotid bifurcation without significant stenosis. 7. Extensive atherosclerotic disease at the aortic arch without significant stenosis. 8. 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 It is reconstituted at C1-2 level. 9. Multilevel degenerative changes in the cervical spine These results were called by telephone at the time of interpretation on 06/21/2018 at 12:52 pm to Dr. Alexis Goodell , who verbally  acknowledged these results. Electronically Signed   By: San Morelle M.D.   On: 06/21/2018 13:01   Ct Head Wo Contrast  Result Date: 06/22/2018 CLINICAL DATA:  Focal neuro deficit, less than 6 hours. TPA 24 hours ago. EXAM: CT HEAD WITHOUT CONTRAST TECHNIQUE: Contiguous axial images were obtained from the base of the skull through the vertex without intravenous contrast. COMPARISON:  CT head without contrast 06/21/2018. CTA head and  neck and CT perfusion head 06/21/2018. FINDINGS: Brain: A large right MCA territory infarct corresponds to the area of core infarct on the CT perfusion exam. There is involvement of the right frontal operculum and insular cortex. Basal ganglia are seen. Right ACA territory is spared. There is local mass effect with effacement of the adjacent sulci. There is no midline shift. There is partial effacement of the right lateral ventricle. Left lateral ventricle is within normal limits. Brainstem and cerebellum are normal. There is no hemorrhage into the infarct. Vascular: Atherosclerotic calcifications are present within the cavernous internal carotid arteries bilaterally. There is no hyperdense vessel. Skull: Calvarium is intact. No focal lytic or blastic lesions are present. Sinuses/Orbits: The paranasal sinuses and mastoid air cells are clear. Bilateral lens replacements are present. Globes and orbits are otherwise within normal limits. IMPRESSION: 1. Involving nonhemorrhagic right MCA territory infarct corresponds with the area predicted by CT perfusion. 2. No hemorrhage within the infarct. 3. Local mass effect without significant midline shift. 4. Generalized atrophy and white matter disease is otherwise stable. Electronically Signed   By: San Morelle M.D.   On: 06/22/2018 12:09   Ct Angio Neck W Or Wo Contrast  Result Date: 06/21/2018 CLINICAL DATA:  Stroke, follow-up. Acute onset of left-sided weakness and facial droop with ice fixed to the right. EXAM: CT ANGIOGRAPHY HEAD AND NECK CT PERFUSION BRAIN TECHNIQUE: Multidetector CT imaging of the head and neck was performed using the standard protocol during bolus administration of intravenous contrast. Multiplanar CT image reconstructions and MIPs were obtained to evaluate the vascular anatomy. Carotid stenosis measurements (when applicable) are obtained utilizing NASCET criteria, using the distal internal carotid diameter as the denominator. Multiphase  CT imaging of the brain was performed following IV bolus contrast injection. Subsequent parametric perfusion maps were calculated using RAPID software. CONTRAST:  120mL ISOVUE-370 IOPAMIDOL (ISOVUE-370) INJECTION 76% COMPARISON:  CT head without contrast from the same day. MRI brain and MRA circle-of-Willis 01/23/2018. FINDINGS: CTA NECK FINDINGS Aortic arch: A 3 vessel arch configuration is present. Extensive atherosclerotic calcifications are present without a significant stenosis of the great vessel origins. There is no aneurysm. Right carotid system: Atherosclerotic calcifications are present along the wall of the right common carotid artery without a significant stenosis. There is calcified and noncalcified plaque through the right carotid bifurcation. There is no focal luminal stenosis relative to the more distal vessel. The cervical right ICA is within normal limits. Left carotid system: The left common carotid artery demonstrates mural calcifications without a significant luminal stenosis. Dense calcifications are present at the left carotid bifurcation. The lumen is narrowed to less than 1 mm. Additional distal calcifications are present in the proximal left ICA without a significant tandem stenosis. Vertebral arteries: The right vertebral artery originates from the subclavian artery without significant stenosis. There is some tortuosity and calcification of the cervical right ICA without a significant stenosis. The left vertebral artery is occluded below the C1-2 level. Skeleton: Multilevel degenerative changes are noted in the cervical spine. Degenerative anterolisthesis is present at C3-4 and to  a greater extent at C4-5. There is chronic loss of disc space at C5-6 and C6-7. No focal lytic or blastic lesions are present. Alignment is otherwise anatomic. Other neck: The soft tissues the neck are otherwise unremarkable. There is no significant adenopathy. Thyroid is within normal limits. Salivary glands are  unremarkable. Upper chest: Mild dependent atelectasis is present. The upper lung fields are otherwise clear without focal nodule, mass, or airspace disease. Ill-defined density in the left upper lobe is stable, likely remote scarring. Review of the MIP images confirms the above findings CTA HEAD FINDINGS Anterior circulation: Atherosclerotic calcifications are present through the cavernous internal carotid arteries bilaterally. There is no significant luminal stenosis relative to the ICA termini. The right A1 segment is dominant. There is mild narrowing of the proximal left M1 segment. The middle right M2 branch is occluded. There is distal left MCA atherosclerotic disease without focal stenosis or occlusion. Significant attenuation of branch vessels in the distal right MCA territory. No significant collaterals are present. Posterior circulation: The left vertebral artery is reconstituted at C1. Dense calcification is present at the dural margin of the vertebral artery on the left. The distal vertebral artery is opacified. The right vertebral artery is within normal limits. PICA origin is high. Basilar artery is within normal limits. Both posterior cerebral arteries originate from the basilar tip. There is mild distal small vessel disease without a significant proximal stenosis or occlusion. Venous sinuses: The dural sinuses are patent. The right transverse sinus is dominant. The straight sinus and deep cerebral veins are intact. Cortical veins are unremarkable. Anatomic variants: None Delayed phase: Postcontrast images demonstrate early loss of gray-white differentiation in the anterior right frontal lobe. Review of the MIP images confirms the above findings CT Brain Perfusion Findings: CBF (<30%) Volume: 16mL Perfusion (Tmax>6.0s) volume: 122mL Mismatch Volume: 67mL Infarction Location:Right frontal lobe IMPRESSION: 1. Acute infarct of the right frontal lobe. Core infarct volume is 91 mL. 2. Occluded right M2  branch at the MCA bifurcation without distal reconstitution or collateral vessels. This corresponds to the infarct territory 3. Mild narrowing of the left M1 segment as described. 4. Diffuse distal small vessel disease without other significant proximal stenosis or occlusion in the circle-of-Willis. 5. High-grade stenosis of the left internal carotid artery at the bifurcation. 6. Atherosclerotic changes involving the right carotid bifurcation without significant stenosis. 7. Extensive atherosclerotic disease at the aortic arch without significant stenosis. 8. 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 It is reconstituted at C1-2 level. 9. Multilevel degenerative changes in the cervical spine These results were called by telephone at the time of interpretation on 06/21/2018 at 12:52 pm to Dr. Alexis Goodell , who verbally acknowledged these results. Electronically Signed   By: San Morelle M.D.   On: 06/21/2018 13:01   Ct Cerebral Perfusion W Contrast  Result Date: 06/21/2018 CLINICAL DATA:  Stroke, follow-up. Acute onset of left-sided weakness and facial droop with ice fixed to the right. EXAM: CT ANGIOGRAPHY HEAD AND NECK CT PERFUSION BRAIN TECHNIQUE: Multidetector CT imaging of the head and neck was performed using the standard protocol during bolus administration of intravenous contrast. Multiplanar CT image reconstructions and MIPs were obtained to evaluate the vascular anatomy. Carotid stenosis measurements (when applicable) are obtained utilizing NASCET criteria, using the distal internal carotid diameter as the denominator. Multiphase CT imaging of the brain was performed following IV bolus contrast injection. Subsequent parametric perfusion maps were calculated using RAPID software. CONTRAST:  149mL ISOVUE-370 IOPAMIDOL (ISOVUE-370) INJECTION 76% COMPARISON:  CT  head without contrast from the same day. MRI brain and MRA circle-of-Willis 01/23/2018. FINDINGS: CTA NECK FINDINGS Aortic arch: A 3 vessel arch  configuration is present. Extensive atherosclerotic calcifications are present without a significant stenosis of the great vessel origins. There is no aneurysm. Right carotid system: Atherosclerotic calcifications are present along the wall of the right common carotid artery without a significant stenosis. There is calcified and noncalcified plaque through the right carotid bifurcation. There is no focal luminal stenosis relative to the more distal vessel. The cervical right ICA is within normal limits. Left carotid system: The left common carotid artery demonstrates mural calcifications without a significant luminal stenosis. Dense calcifications are present at the left carotid bifurcation. The lumen is narrowed to less than 1 mm. Additional distal calcifications are present in the proximal left ICA without a significant tandem stenosis. Vertebral arteries: The right vertebral artery originates from the subclavian artery without significant stenosis. There is some tortuosity and calcification of the cervical right ICA without a significant stenosis. The left vertebral artery is occluded below the C1-2 level. Skeleton: Multilevel degenerative changes are noted in the cervical spine. Degenerative anterolisthesis is present at C3-4 and to a greater extent at C4-5. There is chronic loss of disc space at C5-6 and C6-7. No focal lytic or blastic lesions are present. Alignment is otherwise anatomic. Other neck: The soft tissues the neck are otherwise unremarkable. There is no significant adenopathy. Thyroid is within normal limits. Salivary glands are unremarkable. Upper chest: Mild dependent atelectasis is present. The upper lung fields are otherwise clear without focal nodule, mass, or airspace disease. Ill-defined density in the left upper lobe is stable, likely remote scarring. Review of the MIP images confirms the above findings CTA HEAD FINDINGS Anterior circulation: Atherosclerotic calcifications are present  through the cavernous internal carotid arteries bilaterally. There is no significant luminal stenosis relative to the ICA termini. The right A1 segment is dominant. There is mild narrowing of the proximal left M1 segment. The middle right M2 branch is occluded. There is distal left MCA atherosclerotic disease without focal stenosis or occlusion. Significant attenuation of branch vessels in the distal right MCA territory. No significant collaterals are present. Posterior circulation: The left vertebral artery is reconstituted at C1. Dense calcification is present at the dural margin of the vertebral artery on the left. The distal vertebral artery is opacified. The right vertebral artery is within normal limits. PICA origin is high. Basilar artery is within normal limits. Both posterior cerebral arteries originate from the basilar tip. There is mild distal small vessel disease without a significant proximal stenosis or occlusion. Venous sinuses: The dural sinuses are patent. The right transverse sinus is dominant. The straight sinus and deep cerebral veins are intact. Cortical veins are unremarkable. Anatomic variants: None Delayed phase: Postcontrast images demonstrate early loss of gray-white differentiation in the anterior right frontal lobe. Review of the MIP images confirms the above findings CT Brain Perfusion Findings: CBF (<30%) Volume: 62mL Perfusion (Tmax>6.0s) volume: 119mL Mismatch Volume: 4mL Infarction Location:Right frontal lobe IMPRESSION: 1. Acute infarct of the right frontal lobe. Core infarct volume is 91 mL. 2. Occluded right M2 branch at the MCA bifurcation without distal reconstitution or collateral vessels. This corresponds to the infarct territory 3. Mild narrowing of the left M1 segment as described. 4. Diffuse distal small vessel disease without other significant proximal stenosis or occlusion in the circle-of-Willis. 5. High-grade stenosis of the left internal carotid artery at the  bifurcation. 6. Atherosclerotic changes involving the  right carotid bifurcation without significant stenosis. 7. Extensive atherosclerotic disease at the aortic arch without significant stenosis. 8. 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 It is reconstituted at C1-2 level. 9. Multilevel degenerative changes in the cervical spine These results were called by telephone at the time of interpretation on 06/21/2018 at 12:52 pm to Dr. Alexis Goodell , who verbally acknowledged these results. Electronically Signed   By: San Morelle M.D.   On: 06/21/2018 13:01   Dg Chest Port 1 View  Result Date: 06/27/2018 CLINICAL DATA:  Leukocytosis. EXAM: PORTABLE CHEST 1 VIEW COMPARISON:  03/07/2018. FINDINGS: Normal sized heart. Tortuous aorta. Small right pleural effusion. Stable mildly prominent interstitial markings. Thoracolumbar spine degenerative changes. Bilateral shoulder degenerative changes with superior migration of the right humeral head and bony remodeling compatible with a chronic large right rotator cuff tear. Feeding tube extending into the stomach. IMPRESSION: 1. Small right pleural effusion. 2. Stable chronic interstitial lung disease. Electronically Signed   By: Claudie Revering M.D.   On: 06/27/2018 10:49   Ct Head Code Stroke Wo Contrast  Result Date: 06/21/2018 CLINICAL DATA:  Code stroke. Focal neuro deficit, less than 6 hours, stroke suspected. Patient found unresponsive 1 hour ago. Acute onset of left-sided weakness and facial droop with ice fixed to the right. EXAM: CT HEAD WITHOUT CONTRAST TECHNIQUE: Contiguous axial images were obtained from the base of the skull through the vertex without intravenous contrast. COMPARISON:  CT head without contrast 02/22/2018 FINDINGS: Brain: Atrophy and moderate diffuse white matter changes are again seen bilaterally. No acute cortical infarct, hemorrhage, or mass lesion is present. The basal ganglia are intact. Insular ribbon is normal bilaterally. No acute or  focal right-sided infarct is present. The ventricles are proportionate to the degree of atrophy and stable. The brainstem and cerebellum are normal. Vascular: Atherosclerotic calcifications are present in the cavernous and precavernous internal carotid arteries. There is no hyperdense vessel. Skull: Calvarium is intact. No focal lytic or blastic lesions are present. Sinuses/Orbits: Paranasal sinuses and mastoid air cells are clear. Bilateral lens replacements are present. Globes and orbits are otherwise within normal limits. ASPECTS [facility] Stroke Program Early CT Score) - Ganglionic level infarction (caudate, lentiform nuclei, internal capsule, insula, M1-M3 cortex): 7/7 - Supraganglionic infarction (M4-M6 cortex): 3/3 Total score (0-10 with 10 being normal): 10/10 IMPRESSION: 1. No acute intracranial abnormality. 2. ASPECTS is 10/10 3. Moderate atrophy and white matter disease is stable bilaterally. These results were called by telephone at the time of interpretation on 06/21/2018 at 11:03 am to Dr. Harvest Dark , who verbally acknowledged these results. Electronically Signed   By: San Morelle M.D.   On: 06/21/2018 11:05   Transthoracic Echocardiogram  06/22/2018 Study Conclusions - Left ventricle: The cavity size was normal. Systolic function was vigorous. The estimated ejection fraction was in the range of 65% to 70%. Wall motion was normal; there were no regional wall motion abnormalities. Features are consistent with a pseudonormal left ventricular filling pattern, with concomitant abnormal relaxation and increased filling pressure (grade 2 diastolic dysfunction). Doppler parameters are consistent with high ventricular filling pressure. - Aortic valve: Poorly visualized. Severely calcified annulus. Trileaflet; moderately thickened, moderately calcified leaflets. - Mitral valve: Severely calcified annulus. Moderately thickened, mildly calcified leaflets anterior. There was mild to moderate  regurgitation. - Atrial septum: There was increased thickness of the septum, consistent with lipomatous hypertrophy. Tricuspid valve: There was mild regurgitation. - Pulmonic valve: There was mild regurgitation.   PHYSICAL EXAM  General - cachectic, well developed,  in no apparent distress, mouth breathing.  Ophthalmologic - fundi not visualized due to noncooperation.  Cardiovascular - Regular rate and rhythm, not in A. fib.  Neuro - awake, alert, eyes open, orientated to place and son. Able to follow simple command on the left, severe dysarthria and able to make sound out today but not sentences.  Right gaze preference, however able to cross midline, PERRL, not blinking to visual threat on the left.  Left facial droop mild, tongue midline.  Left upper extremity 1/5 with pain summation, left lower extremity 0/5 proximal and 2/5 distally.  Right upper and lower extremity 3/5. Diffuse ecchymoses BUEs, L>R. DTR 1+, no Babinski. Sensation intact. Coordination not cooperative and gait not tested.   ASSESSMENT/PLAN Dawn [name] is a 84 y.o. female with history of LLE PVD with stenting, hip surgery, TIA, cancer, and HTN presenting with left sided weakness. The patient received IV TPA at [facility] Friday AM 06/21/2018.  Stroke: Large right MCA infarct s/p IV tPA - embolic due to A. fib/aflutter not on AC.  Resultant  Left side hemiplegia, right gaze preference  CT head - No acute intracranial abnormality.   CTA H&N - Occluded right M2 branch. High-grade stenosis of the left internal carotid furcation  CT perfusion - large infarct core   MRI head - not performed due to "metal in body" as per family  2D Echo - EF 65% to 70%. No cardiac source of emboli identified.  LDL 42  HgbA1c - 5.7  VTE prophylaxis - subq heparin  aspirin 81 mg daily and clopidogrel 75 mg daily prior to admission, now aspirin 325.  Will start anticoagulation at 7-10 days post stroke (around  06/29/18) given large size of stroke  Ongoing aggressive stroke risk factor management  Therapy recommendations:  SNF   Disposition:  Pending. Anticipate medically ready once stable post PEG placement  A. fib/a flutter not on AC  As per family, patient has diagnosed with A. fib in the past not on [facility], but on aspirin and Plavix  AF confirmed in ICU  On aspirin 325  On Metoprolol  Rate controlled  We will start anticoagulation at 7-10 days after stroke (around 06/29/18) given large size of the stroke  Hyperlipidemia  LDL 42, goal LDL < 70   Lipitor 20 PTA, resumed in hospital  Continue statin at d/c  Hypertension  Stable on the high end  Treated post tPA w/ cleviprex . SBP < 160  . Resume home BP medications including Norvasc and clonidine.   . Added metoprolol, increase to 25mg  bid . Long-term BP goal normotensive  Dysphagia  Did not pass swallow  NPO   On CorTrak for tube feeding  Plan for PEG tube Friday  Trauma team consulted  Speech on board  NT suctioning   Mucomyst neb  Other Stroke Risk Factors  Advanced age  Former cigarette smoker - quit  Other Active Problems  Elevated creatinine, resolved 0.95  Hypokalemia, resolved - supplement 4.4  Leukocytosis - 8.6->12.2->13.5. Etiology unclear. CXR w/ sm R pleural effusion. UA pending   Dietitian consult 06/23/2018 - Severe malnutrition in context of chronic illness.  Palliative care on board. Plans for full treatment  No BM documented since admission, will give [facility] day # [place], MSN, APRN, ANVP-BC, AGPCNP-BC Advanced Practice Stroke Nurse [place] for Schedule & Pager information 06/27/2018 2:48 PM   ATTENDING NOTE: I reviewed above note and  agree with the assessment and plan. I have made any additions or clarifications directly to the above note. Pt was seen and examined.   Patient neurologically unchanged, no acute event overnight.   Still on tube feeding with free water flush.  Did not get PEG tube today due to no holding tube feeding overnight.  Will schedule for tomorrow.   WBC trending up from normal to 12.2 and then to 13.5 today.  Check UA and CXR.  Afebrile.  Discharge planning to SNF.  Rosalin Hawking, MD PhD Stroke Neurology 06/27/2018 9:07 PM     To contact Stroke Continuity provider, please refer to [email or web address] After hours, contact General Neurology

## 2018-06-27 NOTE — Plan of Care (Signed)
°  Problem: Coping: °Goal: Level of anxiety will decrease °Outcome: Progressing °  °

## 2018-06-27 NOTE — NC FL2 (Signed)
[place] MEDICAID FL2 LEVEL OF CARE SCREENING TOOL     IDENTIFICATION  Patient Name: Dawn [name] Birthdate: 1928/03/23 Sex: female Admission Date (Current Location): 06/21/2018  [facility] and [place] Number:  Engineering geologist and Address:  [facility] [place] [place] [address] [place], [place] [address]      Provider Number: [id number]  Attending Physician Name and Address:  Rosalin Hawking, MD  Relative Name and Phone Number:       Current Level of Care: Hospital Recommended Level of Care: [place] Prior Approval Number:    Date Approved/Denied:   PASRR Number: [id number]  Discharge Plan: SNF    Current Diagnoses: Patient Active Problem List   Diagnosis Date Noted  . Hypokalemia   . Paroxysmal atrial fibrillation (HCC)   . Oropharyngeal dysphagia   . Goals of care, counseling/discussion   . Palliative care encounter   . Excessive oral secretions   . Acute right arterial ischemic stroke, middle cerebral artery (MCA) ([place]) 06/21/2018  . Symptomatic anemia 03/07/2018  . TIA (transient ischemic attack) 01/22/2018  . Pressure injury of skin 01/04/2018  . Sepsis ([place]) 01/03/2018  . Protein-calorie malnutrition, severe 11/14/2017  . Atherosclerosis of native arteries of extremity with rest pain ([place]) 11/09/2017  . Ischemic leg 11/09/2017  . Atherosclerotic peripheral vascular disease with rest pain ([place]) 11/09/2017  . Atypical chest pain 09/21/2017  . Renal artery stenosis ([place]) 06/15/2017  . Hyperlipidemia 05/09/2017  . Essential hypertension 10/17/2016  . Carotid stenosis 10/17/2016  . PVD (peripheral vascular disease) ([place]) 10/17/2016  . B12 deficiency 07/19/2016  . Vitamin D deficiency, unspecified 07/19/2016  . History of right hip replacement 11/04/2015  . Aftercare following bilateral hip joint replacement surgery 11/05/2014  . SCC (squamous cell carcinoma) 05/21/2014  . 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 S/P hip  replacement 10/03/2012    Orientation RESPIRATION BLADDER Height & Weight     Self, Place  Normal Incontinent Weight: 105 lb 13.1 oz (48 kg) Height:  5\' 7"  (170.2 cm)  BEHAVIORAL SYMPTOMS/MOOD NEUROLOGICAL BOWEL NUTRITION STATUS      Continent Diet, Feeding tube(will have peg placed)  AMBULATORY STATUS COMMUNICATION OF NEEDS Skin   Extensive Assist Verbally PU Stage and Appropriate Care PU Stage 1 Dressing: (sacrum, foam dressing changed every 3 days)                     Personal Care Assistance Level of Assistance  Bathing, Feeding, Dressing Bathing Assistance: Maximum assistance Feeding assistance: Maximum assistance Dressing Assistance: Maximum assistance     Functional Limitations Info  Sight, Hearing, Speech Sight Info: Adequate Hearing Info: Adequate Speech Info: Impaired(slurred, dysarthria)    SPECIAL CARE FACTORS FREQUENCY  PT (By licensed PT), OT (By licensed OT), Speech therapy     PT Frequency: 5x/wk OT Frequency: 5x/wk     Speech Therapy Frequency: 5x/wk      Contractures Contractures Info: Not present    Additional Factors Info  Code Status, Allergies Code Status Info: DNR Allergies Info: Percocet Oxycodone-acetaminophen, Percodan Oxycodone-aspirin           Current Medications (06/27/2018):  This is the current hospital active medication list Current Facility-Administered Medications  Medication Dose Route Frequency Provider Last Rate Last Dose  . 0.9 %  sodium chloride infusion   Intravenous Continuous Rosalin Hawking, MD 20 mL/hr at 06/26/18 1100    . acetaminophen (TYLENOL) tablet 650 mg  650 mg Oral Q6H PRN Rosalin Hawking,  MD      . albuterol (PROVENTIL) (2.5 MG/3ML) 0.083% nebulizer solution 2.5 mg  2.5 mg Nebulization Q6H PRN Rosalin Hawking, MD   2.5 mg at 06/26/18 0736  . amLODipine (NORVASC) tablet 10 mg  10 mg Per Tube Daily Rosalin Hawking, MD   10 mg at 06/27/18 1021  . aspirin tablet 325 mg  325 mg Per Tube Daily Rosalin Hawking, MD   325 mg at  06/27/18 1021  . atorvastatin (LIPITOR) tablet 20 mg  20 mg Per Tube [id number] Rosalin Hawking, MD   20 mg at 06/26/18 1648  . bisacodyl (DULCOLAX) suppository 10 mg  10 mg Rectal Daily PRN Donzetta Starch, NP   10 mg at 06/25/18 1258  . chlorhexidine (PERIDEX) 0.12 % solution 15 mL  15 mL Mouth Rinse BID Aroor, Karena Addison R, MD   15 mL at 06/27/18 1029  . cloNIDine (CATAPRES) tablet 0.1 mg  0.1 mg Per Tube BID Rosalin Hawking, MD   0.1 mg at 06/27/18 1021  . feeding supplement (OSMOLITE 1.2 CAL) liquid 1,000 mL  1,000 mL Per Tube Continuous Rosalin Hawking, MD 55 mL/hr at 06/27/18 1228 1,000 mL at 06/27/18 1228  . feeding supplement (PRO-STAT SUGAR FREE 64) liquid 30 mL  30 mL Per Tube Daily Rosalin Hawking, MD   30 mL at 06/27/18 1020  . ferrous sulfate 300 (60 Fe) MG/5ML syrup 300 mg  300 mg Per Tube BID WC Rosalin Hawking, MD   300 mg at 06/27/18 1622  . glycopyrrolate (ROBINUL) injection 0.2 mg  0.2 mg Intravenous TID Dellinger, Marianne L, PA-C   0.2 mg at 06/27/18 1621  . heparin injection 5,000 Units  5,000 Units Subcutaneous Q12H Rosalin Hawking, MD   5,000 Units at 06/27/18 1021  . labetalol (NORMODYNE,TRANDATE) injection 10-20 mg  10-20 mg Intravenous Q2H PRN Rosalin Hawking, MD      . MEDLINE mouth rinse  15 mL Mouth Rinse q12n4p Aroor, Karena Addison R, MD   15 mL at 06/27/18 1637  . metoprolol tartrate (LOPRESSOR) 25 mg/10 mL oral suspension 25 mg  25 mg Per Tube BID Rosalin Hawking, MD   25 mg at 06/27/18 1028  . pantoprazole sodium (PROTONIX) 40 mg/20 mL oral suspension 40 mg  40 mg Per Tube Daily Rosalin Hawking, MD   40 mg at 06/27/18 1020  . polyethylene glycol (MIRALAX / GLYCOLAX) packet 17 g  17 g Per Tube Daily PRN Donzetta Starch, NP      . vitamin B-12 (CYANOCOBALAMIN) tablet 5,000 mcg  5,000 mcg Per Tube Daily Rosalin Hawking, MD   5,000 mcg at 06/27/18 1021     Discharge Medications: Please see discharge summary for a list of discharge medications.  Relevant Imaging Results:  Relevant Lab Results:   Additional  Information SS#: [id number]  Geralynn Ochs, LCSW

## 2018-06-27 NOTE — Care Management Important Message (Signed)
Important Message  Patient Details  Name: Dawn [name] MRN: [id number] Date of Birth: 15-Dec-1927   Medicare Important Message Given:  Yes    Oshen Wlodarczyk 06/27/2018, 1:26 PM

## 2018-06-27 NOTE — Progress Notes (Addendum)
CSW following for discharge plan. CSW contacted [facility] to check on status of patient's SNF days. Patient discharged from [facility] on 03/15/18, and has not had any hospital admissions until present; patient's SNF days have reset due to a greater than 60 day period of wellness. Per Admissions at [facility], they are willing to take the patient back but the son will need to pay off the outstanding balance of $950 prior to admission.  CSW provided information to patient's son. Patient's son discussed facility options, and that he had been mostly happy with [facility] over other facilities that the patient has been to in the past. When patient's son questioned the patient about returning to [facility], she shook her head no. Patient nodded her head that she wanted to look at some of the other options. CSW provided a list to patient's son to look over, and received permission to fax out referral. Patient's son was not interested in [facility] or [facility], due to past poor experiences.  CSW indicated that patient would likely be ready on Saturday, as long as the peg placement goes well. Patient's son discussed how he felt like that was too soon, and wanted to make sure that she was stable prior to discharge. Patient's son asked about appealing the discharge, and CSW indicated that he should see how the peg placement goes and touch base with the MD on his concerns. CSW reassured the patient's son that the MD would not discharge the patient prior to her being medically stable.  CSW to check back in with patient's son on facility choice. CSW to follow.  Laveda Abbe, [place] Clinical Social Worker [phone]

## 2018-06-28 ENCOUNTER — Encounter (HOSPITAL_COMMUNITY)
Admission: AD | Disposition: A | Discharge status: Skilled Nursing Facility | Payer: Self-pay | Source: Other Acute Inpatient Hospital | Attending: Neurology

## 2018-06-28 ENCOUNTER — Inpatient Hospital Stay (HOSPITAL_COMMUNITY): Payer: Medicare Other | Admitting: Anesthesiology

## 2018-06-28 ENCOUNTER — Encounter (HOSPITAL_COMMUNITY): Payer: Self-pay | Admitting: Certified Registered Nurse Anesthetist

## 2018-06-28 ENCOUNTER — Inpatient Hospital Stay (HOSPITAL_COMMUNITY): Payer: Medicare Other

## 2018-06-28 DIAGNOSIS — Z931 Gastrostomy status: Secondary | ICD-10-CM

## 2018-06-28 HISTORY — PX: PEG PLACEMENT: SHX5437

## 2018-06-28 HISTORY — PX: ESOPHAGOGASTRODUODENOSCOPY (EGD) WITH PROPOFOL: SHX5813

## 2018-06-28 LAB — GLUCOSE, CAPILLARY
GLUCOSE-CAPILLARY: 90 mg/dL (ref 70–99)
Glucose-Capillary: 107 mg/dL — ABNORMAL HIGH (ref 70–99)
Glucose-Capillary: 92 mg/dL (ref 70–99)
Glucose-Capillary: 93 mg/dL (ref 70–99)
Glucose-Capillary: 95 mg/dL (ref 70–99)
Glucose-Capillary: 99 mg/dL (ref 70–99)
Glucose-Capillary: 99 mg/dL (ref 70–99)

## 2018-06-28 LAB — CBC
HEMATOCRIT: 33.6 % — AB (ref 36.0–46.0)
HEMOGLOBIN: 10.6 g/dL — AB (ref 12.0–15.0)
MCH: 28.1 pg (ref 26.0–34.0)
MCHC: 31.5 g/dL (ref 30.0–36.0)
MCV: 89.1 fL (ref 78.0–100.0)
Platelets: 353 10*3/uL (ref 150–400)
RBC: 3.77 MIL/uL — ABNORMAL LOW (ref 3.87–5.11)
RDW: 16.8 % — AB (ref 11.5–15.5)
WBC: 11.5 10*3/uL — ABNORMAL HIGH (ref 4.0–10.5)

## 2018-06-28 LAB — BASIC METABOLIC PANEL
Anion gap: 11 (ref 5–15)
BUN: 33 mg/dL — AB (ref 8–23)
CALCIUM: 8.8 mg/dL — AB (ref 8.9–10.3)
CHLORIDE: 102 mmol/L (ref 98–111)
CO2: 25 mmol/L (ref 22–32)
CREATININE: 0.96 mg/dL (ref 0.44–1.00)
GFR calc Af Amer: 60 mL/min — ABNORMAL LOW (ref >60)
GFR calc non Af Amer: 52 mL/min — ABNORMAL LOW (ref >60)
Glucose, Bld: 95 mg/dL (ref 70–99)
Potassium: 4.5 mmol/L (ref 3.5–5.1)
Sodium: 138 mmol/L (ref 135–145)

## 2018-06-28 SURGERY — ESOPHAGOGASTRODUODENOSCOPY (EGD) WITH PROPOFOL
Anesthesia: Monitor Anesthesia Care

## 2018-06-28 MED ORDER — ONDANSETRON HCL 4 MG/2ML IJ SOLN
INTRAMUSCULAR | Status: DC | PRN
  Administered 2018-06-28: 4 mg via INTRAVENOUS

## 2018-06-28 MED ORDER — LACTATED RINGERS IV SOLN
INTRAVENOUS | Status: DC
  Administered 2018-06-28: 1000 mL via INTRAVENOUS

## 2018-06-28 MED ORDER — GLYCOPYRROLATE PF 0.2 MG/ML IJ SOSY
PREFILLED_SYRINGE | INTRAMUSCULAR | Status: DC | PRN
  Administered 2018-06-28: .2 mg via INTRAVENOUS

## 2018-06-28 MED ORDER — POLYVINYL ALCOHOL 1.4 % OP SOLN
1.0000 [drp] | OPHTHALMIC | Status: DC
  Administered 2018-06-28 – 2018-07-04 (×30): 1 [drp] via OPHTHALMIC
  Filled 2018-06-28: qty 15

## 2018-06-28 MED ORDER — PROPOFOL 500 MG/50ML IV EMUL
INTRAVENOUS | Status: DC | PRN
  Administered 2018-06-28: 25 ug/kg/min via INTRAVENOUS

## 2018-06-28 MED ORDER — PROPOFOL 10 MG/ML IV BOLUS
INTRAVENOUS | Status: DC | PRN
  Administered 2018-06-28 (×2): 20 mg via INTRAVENOUS

## 2018-06-28 MED ORDER — ARTIFICIAL TEARS OPHTHALMIC OINT
TOPICAL_OINTMENT | Freq: Every day | OPHTHALMIC | Status: DC
  Administered 2018-06-28: 1 via OPHTHALMIC
  Administered 2018-06-29 – 2018-07-03 (×5): via OPHTHALMIC
  Filled 2018-06-28 (×2): qty 3.5

## 2018-06-28 NOTE — Progress Notes (Signed)
SLP Cancellation Note  Patient Details Name: Dawn [name] MRN: [id number] DOB: September 20, 1928   Cancelled treatment:       Reason Eval/Treat Not Completed: Patient at procedure or test/unavailable.  Attempted to see pt earlier this AM for treatment before scheduled procedure but pt was getting bathed and is now off the floor.  Will continue to follow along for treatment as pt is available.   Lekeisha Arenas, Selinda Orion 06/28/2018, 11:18 AM

## 2018-06-28 NOTE — Anesthesia Preprocedure Evaluation (Signed)
Anesthesia Evaluation  Patient identified by MRN, date of birth, ID band Patient awake    Reviewed: Allergy & Precautions, H&P , NPO status , Patient's Chart, lab work & pertinent test results, reviewed documented beta blocker date and time   Airway Mallampati: II  TM Distance: >3 FB Neck ROM: full    Dental  (+) Upper Dentures, Partial Lower   Pulmonary neg pulmonary ROS, asthma , former smoker,    Pulmonary exam normal        Cardiovascular Exercise Tolerance: Poor hypertension, On Medications + Peripheral Vascular Disease  negative cardio ROS Normal cardiovascular exam Rhythm:regular Rate:Normal     Neuro/Psych  Neuromuscular disease CVA, Residual Symptoms negative neurological ROS  negative psych ROS   GI/Hepatic negative GI ROS, Neg liver ROS,   Endo/Other  negative endocrine ROS  Renal/GU Renal diseasenegative Renal ROS  negative genitourinary   Musculoskeletal   Abdominal   Peds  Hematology negative hematology ROS (+)   Anesthesia Other Findings Past Medical History: No date: Cancer ([place])     Comment:  skin cancer No date: Family history of adverse reaction to anesthesia     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 No date: Hypertension No date: Sciatica No date: Sciatica Past Surgical History: No date: ABDOMINAL HYSTERECTOMY No date: APPENDECTOMY No date: CATARACT EXTRACTION, BILATERAL No date: CHOLECYSTECTOMY 04/19/2017: ENDARTERECTOMY FEMORAL; Right     Comment:  Procedure: ENDARTERECTOMY COMMON FEMORAL ARTERY,               PROFUNDA  FEMORIS ARTERY, and  SUPERFICIAL FEMORAL               ARTERY;  Surgeon: Algernon Huxley, MD;  Location: [facility] ORS;               Service: Vascular;  Laterality: Right; No date: HIP SURGERY; Bilateral 04/19/2017: LOWER EXTREMITY ANGIOGRAM; Right     Comment:  Procedure: LOWER EXTREMITY ANGIOGRAM WITH SUPERFICIAL               FEMORAL ARTERY STENT PLACEMENT;   Surgeon: Algernon Huxley,               MD;  Location: [facility] ORS;  Service: Vascular;  Laterality:              Right; 04/18/2017: LOWER EXTREMITY ANGIOGRAPHY; Right     Comment:  Procedure: Lower Extremity Angiography;  Surgeon: Algernon Huxley, MD;  Location: [place] CV LAB;  Service:               Cardiovascular;  Laterality: Right; 07/12/2017: LOWER EXTREMITY ANGIOGRAPHY; Left     Comment:  Procedure: Lower Extremity Angiography;  Surgeon: Algernon Huxley, MD;  Location: [place] CV LAB;  Service:               Cardiovascular;  Laterality: Left; 11/09/2017: LOWER EXTREMITY ANGIOGRAPHY; Left     Comment:  Procedure: LOWER EXTREMITY ANGIOGRAPHY;  Surgeon: Algernon Huxley, MD;  Location: [place] CV LAB;  Service:               Cardiovascular;  Laterality: Left; 07/12/2017: LOWER EXTREMITY INTERVENTION     Comment:  Procedure: Lower Extremity Intervention;  Surgeon: Lucky Cowboy,  Erskine Squibb, MD;  Location: [place] CV LAB;  Service:               Cardiovascular;; 11/09/2017: LOWER EXTREMITY INTERVENTION     Comment:  Procedure: LOWER EXTREMITY INTERVENTION;  Surgeon: Algernon Huxley, MD;  Location: [place] CV LAB;  Service:               Cardiovascular;; 06/21/2017: RENAL ANGIOGRAPHY; N/A     Comment:  Procedure: Renal Angiography;  Surgeon: Algernon Huxley,               MD;  Location: [place] CV LAB;  Service:               Cardiovascular;  Laterality: N/A; No date: SHOULDER SURGERY; Bilateral No date: TOTAL HIP ARTHROPLASTY; Bilateral BMI    Body Mass Index:  17.03 kg/m     Reproductive/Obstetrics negative OB ROS                                                              Anesthesia Evaluation  Patient identified by MRN, date of birth, ID band Patient awake    Reviewed: Allergy & Precautions, H&P , NPO status , Patient's Chart, lab work & pertinent test results, reviewed  documented beta blocker date and time   History of Anesthesia Complications (+) Family history of anesthesia reaction and history of anesthetic complications  Airway Mallampati: II  TM Distance: >3 FB Neck ROM: full    Dental  (+) Edentulous Upper, Upper Dentures, Partial Lower, Dental Advidsory Given   Pulmonary neg shortness of breath, asthma , neg sleep apnea, neg COPD, neg recent URI, former smoker,           Cardiovascular Exercise Tolerance: Good hypertension, On Medications (-) angina(-) CAD, (-) Past MI, (-) Cardiac Stents and (-) CABG (-) dysrhythmias (-) Valvular Problems/Murmurs     Neuro/Psych neg Seizures  Neuromuscular disease negative psych ROS   GI/Hepatic negative GI ROS, Neg liver ROS,   Endo/Other  negative endocrine ROS  Renal/GU negative Renal ROS  negative genitourinary   Musculoskeletal   Abdominal   Peds  Hematology negative hematology ROS (+)   Anesthesia Other Findings Past Medical History: No date: Family history of adverse reaction to anesthes*     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 No date: Hypertension No date: Sciatica No date: Sciatica   Reproductive/Obstetrics negative OB ROS                             Anesthesia Physical Anesthesia Plan  ASA: II  Anesthesia Plan: General   Post-op Pain Management:    Induction:   Airway Management Planned:   Additional Equipment:   Intra-op Plan:   Post-operative Plan:   Informed Consent: I have reviewed the patients History and Physical, chart, labs and discussed the procedure including the risks, benefits and alternatives for the proposed anesthesia with the patient or authorized representative who has indicated his/her understanding and acceptance.   Dental Advisory Given  Plan Discussed with: Anesthesiologist, CRNA and Surgeon  Anesthesia Plan Comments:         Anesthesia Quick Evaluation  Anesthesia  Physical  Anesthesia Plan  ASA: IV  Anesthesia Plan: MAC   Post-op Pain Management:    Induction: Intravenous  PONV Risk Score and Plan: 2 and Ondansetron, Midazolam and Treatment may vary due to age or medical condition  Airway Management Planned: Nasal Cannula  Additional Equipment:   Intra-op Plan:   Post-operative Plan:   Informed Consent: I have reviewed the patients History and Physical, chart, labs and discussed the procedure including the risks, benefits and alternatives for the proposed anesthesia with the patient or authorized representative who has indicated his/her understanding and acceptance.   Dental Advisory Given  Plan Discussed with: CRNA  Anesthesia Plan Comments:         Anesthesia Quick Evaluation

## 2018-06-28 NOTE — Anesthesia Procedure Notes (Signed)
Procedure Name: MAC Date/Time: 06/28/2018 10:13 AM Performed by: Lowella Dell, CRNA Pre-anesthesia Checklist: Patient identified, Emergency Drugs available, Suction available, Patient being monitored and Timeout performed Patient Re-evaluated:Patient Re-evaluated prior to induction Oxygen Delivery Method: Nasal cannula Induction Type: IV induction Airway Equipment and Method: Bite block Placement Confirmation: positive ETCO2 Dental Injury: Teeth and Oropharynx as per pre-operative assessment

## 2018-06-28 NOTE — Op Note (Signed)
[facility] Patient Name: Dawn [name] Procedure Date : 06/28/2018 MRN: [id number] Attending MD: Rikki Spearing, MD Date of Birth: Apr 19, 1928 CSN: [id number] Age: 84 Admit Type: Inpatient Procedure:                Upper GI endoscopy Indications:              Place PEG because patient is unable to eat, Place                            PEG due to dysphagia, Place PEG due to impaired                            swallowing, Place PEG due to aspiration risk, Place                            PEG due to neurological disorder causing impaired                            swallowing Providers:                Rikki Spearing, MD, Cleda Daub, RN, William Dalton, Technician Referring MD:              Medicines:                Monitored Anesthesia Care Complications:            No immediate complications. Estimated Blood Loss:     Estimated blood loss was minimal. Procedure:                Pre-Anesthesia Assessment:                           - This assessment was completed at 00:10 AM, prior                            to the administration of sedation.                           After obtaining informed consent, the endoscope was                            passed under direct vision. Throughout the                            procedure, the patient's blood pressure, pulse, and                            oxygen saturations were monitored continuously. The                            GIF-H190 ([id number]) Olympus adult EGD was introduced                            through  the mouth, and advanced to the second part                            of duodenum. The upper GI endoscopy was                            accomplished without difficulty. The patient                            tolerated the procedure well. The total duration of                            the procedure was 25 minutes. Scope In: Scope Out: Findings:      The esophagus was normal.  Clotted blood was found in the gastric body.      Clotted blood was found in the gastric body. Placement of an externally       removable PEG with no T-fasteners was successfully completed. The       external bumper was at the 2.5 cm marking on the tube. Impression:               - Normal esophagus.                           - Clotted blood in the gastric body.                           - No specimens collected. Recommendation:           - Please follow the post-PEG recommendations                            including: may use PEG today for meds and water,                            use PEG today after checked by physician and start                            using PEG today. Procedure Code(s):        --- Professional ---                           [phone], Esophagogastroduodenoscopy, flexible,                            transoral; with directed placement of percutaneous                            gastrostomy tube Diagnosis Code(s):        --- Professional ---                           K92.2, Gastrointestinal hemorrhage, unspecified                           R63.3, Feeding difficulties  Z43.1, Encounter for attention to gastrostomy                           R13.10, Dysphagia, unspecified                           R29.818, Other symptoms and signs involving the                            nervous system CPT copyright 2017 American Medical Association. All rights reserved. The codes documented in this report are preliminary and upon coder review may  be revised to meet current compliance requirements. Rikki Spearing, MD [name], MD 06/28/2018 11:02:59 AM This report has been signed electronically. Number of Addenda: 0

## 2018-06-28 NOTE — Transfer of Care (Signed)
Immediate Anesthesia Transfer of Care Note  Patient: Dawn [name]  Procedure(s) Performed: ESOPHAGOGASTRODUODENOSCOPY (EGD) WITH PROPOFOL (N/A ) PERCUTANEOUS ENDOSCOPIC GASTROSTOMY (PEG) PLACEMENT (N/A )  Patient Location: PACU and Endoscopy Unit  Anesthesia Type:MAC  Level of Consciousness: drowsy and patient cooperative  Airway & Oxygen Therapy: Patient connected to nasal cannula oxygen spontaneously breathing  Post-op Assessment: Report given to RN and Post -op Vital signs reviewed and stable  Post vital signs: Reviewed and stable  Last Vitals:  Vitals Value Taken Time  BP 169/67 06/28/2018 10:44 AM  Temp    Pulse 97 06/28/2018 10:45 AM  Resp 20 06/28/2018 10:45 AM  SpO2 99 % 06/28/2018 10:45 AM  Vitals shown include unvalidated device data.  Last Pain:  Vitals:   06/28/18 0940  TempSrc: Oral  PainSc: 0-No pain         Complications: No apparent anesthesia complications

## 2018-06-28 NOTE — Progress Notes (Addendum)
Patient had PEG tube placement verification which was ago for tube feeding, sent Trauma MD a text to verify that it is OK to start feed and what volume to start at, Dr. Erlinda Hong is aware of this situation.  Dr Redmond Pulling Trauma MD said to start tube feed tomorrow.

## 2018-06-28 NOTE — Anesthesia Postprocedure Evaluation (Signed)
Anesthesia Post Note  Patient: Graelyn Hanks Micco  Procedure(s) Performed: ESOPHAGOGASTRODUODENOSCOPY (EGD) WITH PROPOFOL (N/A ) PERCUTANEOUS ENDOSCOPIC GASTROSTOMY (PEG) PLACEMENT (N/A )     Patient location during evaluation: PACU Anesthesia Type: MAC Level of consciousness: awake and alert Pain management: pain level controlled Vital Signs Assessment: post-procedure vital signs reviewed and stable Respiratory status: spontaneous breathing, nonlabored ventilation and respiratory function stable Cardiovascular status: stable and blood pressure returned to baseline Postop Assessment: no apparent nausea or vomiting Anesthetic complications: no    Last Vitals:  Vitals:   06/28/18 1105 06/28/18 1136  BP: (!) 180/64 (!) 157/70  Pulse: 95 91  Resp: 15 15  Temp:  36.7 C  SpO2: 100% 94%    Last Pain:  Vitals:   06/28/18 1136  TempSrc: Oral  PainSc:                  Lynda Rainwater

## 2018-06-28 NOTE — Progress Notes (Addendum)
STROKE TEAM PROGRESS NOTE   SUBJECTIVE (INTERVAL HISTORY) Patient son is at bedside. Pt recently back from PEG tube placement. Per RN, pt lethargic prior to procedure. Now states pt looks like she is going to die with NIHSS going from 22->off the charts. Pt lying in bed, sleeping with her mouth and eyes open, wrapped head to toe in blankets. Wakens to touch. She will answer questions and follow simple commends. Extremity strength unchanged. Denies pain or discomfort.  Son concerned about mouth care, eye care and cold room temperature. I directed him to charge RN or Database administrator.  OBJECTIVE Temp:  [97.7 F (36.5 C)-98.6 F (37 C)] 98.6 F (37 C) (08/02 0714) Pulse Rate:  [77-105] 95 (08/02 0714) Cardiac Rhythm: Normal sinus rhythm (08/02 0709) Resp:  [16-25] 25 (08/02 0714) BP: (137-171)/(54-79) 170/75 (08/02 0714) SpO2:  [95 %-99 %] 95 % (08/02 0714) Weight:  [49.1 kg (108 lb 5 oz)-49.7 kg (109 lb 9.1 oz)] 49.1 kg (108 lb 5 oz) (08/02 0500)  CBC:  Recent Labs  Lab 06/21/18 1102  06/27/18 0521 06/28/18 0536  WBC 8.6   < > 13.5* 11.5*  NEUTROABS 7.2*  --   --   --   HGB 11.8*   < > 10.7* 10.6*  HCT 35.6   < > 34.5* 33.6*  MCV 87.0   < > 89.4 89.1  PLT 250   < > 302 353   < > = values in this interval not displayed.    Basic Metabolic Panel:  Recent Labs  Lab 06/24/18 1703 06/25/18 0412  06/27/18 0521 06/28/18 0536  NA  --  142   < > 140 138  K  --  2.9*   < > 4.4 4.5  CL  --  109   < > 105 102  CO2  --  22   < > 26 25  GLUCOSE  --  127*   < > 127* 95  BUN  --  40*   < > 38* 33*  CREATININE  --  1.10*   < > 0.95 0.96  CALCIUM  --  8.7*   < > 8.7* 8.8*  MG 2.2 2.2  --   --   --   PHOS 4.3 3.6  --   --   --    < > = values in this interval not displayed.    Lipid Panel:     Component Value Date/Time   CHOL 103 06/22/2018 0151   TRIG 67 06/22/2018 0151   HDL 48 06/22/2018 0151   CHOLHDL 2.1 06/22/2018 0151   VLDL 13 06/22/2018 0151   LDLCALC 42 06/22/2018 0151    HgbA1c:  Lab Results  Component Value Date   HGBA1C 5.7 (H) 06/22/2018   Urine Drug Screen:     Component Value Date/Time   LABOPIA NONE DETECTED 01/22/2018 1700   COCAINSCRNUR NONE DETECTED 01/22/2018 1700   LABBENZ NONE DETECTED 01/22/2018 1700   AMPHETMU NONE DETECTED 01/22/2018 1700   THCU NONE DETECTED 01/22/2018 1700   LABBARB NONE DETECTED 01/22/2018 1700    Alcohol Level     Component Value Date/Time   ETH <10 01/22/2018 2013    IMAGING  Ct Angio Head W Or Wo Contrast  Result Date: 06/21/2018 CLINICAL DATA:  Stroke, follow-up. Acute onset of left-sided weakness and facial droop with ice fixed to the right. EXAM: CT ANGIOGRAPHY HEAD AND NECK CT PERFUSION BRAIN TECHNIQUE: Multidetector CT imaging of the head and neck was performed  using the standard protocol during bolus administration of intravenous contrast. Multiplanar CT image reconstructions and MIPs were obtained to evaluate the vascular anatomy. Carotid stenosis measurements (when applicable) are obtained utilizing NASCET criteria, using the distal internal carotid diameter as the denominator. Multiphase CT imaging of the brain was performed following IV bolus contrast injection. Subsequent parametric perfusion maps were calculated using RAPID software. CONTRAST:  151mL ISOVUE-370 IOPAMIDOL (ISOVUE-370) INJECTION 76% COMPARISON:  CT head without contrast from the same day. MRI brain and MRA circle-of-Willis 01/23/2018. FINDINGS: CTA NECK FINDINGS Aortic arch: A 3 vessel arch configuration is present. Extensive atherosclerotic calcifications are present without a significant stenosis of the great vessel origins. There is no aneurysm. Right carotid system: Atherosclerotic calcifications are present along the wall of the right common carotid artery without a significant stenosis. There is calcified and noncalcified plaque through the right carotid bifurcation. There is no focal luminal stenosis relative to the more distal  vessel. The cervical right ICA is within normal limits. Left carotid system: The left common carotid artery demonstrates mural calcifications without a significant luminal stenosis. Dense calcifications are present at the left carotid bifurcation. The lumen is narrowed to less than 1 mm. Additional distal calcifications are present in the proximal left ICA without a significant tandem stenosis. Vertebral arteries: The right vertebral artery originates from the subclavian artery without significant stenosis. There is some tortuosity and calcification of the cervical right ICA without a significant stenosis. The left vertebral artery is occluded below the C1-2 level. Skeleton: Multilevel degenerative changes are noted in the cervical spine. Degenerative anterolisthesis is present at C3-4 and to a greater extent at C4-5. There is chronic loss of disc space at C5-6 and C6-7. No focal lytic or blastic lesions are present. Alignment is otherwise anatomic. Other neck: The soft tissues the neck are otherwise unremarkable. There is no significant adenopathy. Thyroid is within normal limits. Salivary glands are unremarkable. Upper chest: Mild dependent atelectasis is present. The upper lung fields are otherwise clear without focal nodule, mass, or airspace disease. Ill-defined density in the left upper lobe is stable, likely remote scarring. Review of the MIP images confirms the above findings CTA HEAD FINDINGS Anterior circulation: Atherosclerotic calcifications are present through the cavernous internal carotid arteries bilaterally. There is no significant luminal stenosis relative to the ICA termini. The right A1 segment is dominant. There is mild narrowing of the proximal left M1 segment. The middle right M2 branch is occluded. There is distal left MCA atherosclerotic disease without focal stenosis or occlusion. Significant attenuation of branch vessels in the distal right MCA territory. No significant collaterals are  present. Posterior circulation: The left vertebral artery is reconstituted at C1. Dense calcification is present at the dural margin of the vertebral artery on the left. The distal vertebral artery is opacified. The right vertebral artery is within normal limits. PICA origin is high. Basilar artery is within normal limits. Both posterior cerebral arteries originate from the basilar tip. There is mild distal small vessel disease without a significant proximal stenosis or occlusion. Venous sinuses: The dural sinuses are patent. The right transverse sinus is dominant. The straight sinus and deep cerebral veins are intact. Cortical veins are unremarkable. Anatomic variants: None Delayed phase: Postcontrast images demonstrate early loss of gray-white differentiation in the anterior right frontal lobe. Review of the MIP images confirms the above findings CT Brain Perfusion Findings: CBF (<30%) Volume: 77mL Perfusion (Tmax>6.0s) volume: 185mL Mismatch Volume: 27mL Infarction Location:Right frontal lobe IMPRESSION: 1. Acute infarct of  the right frontal lobe. Core infarct volume is 91 mL. 2. Occluded right M2 branch at the MCA bifurcation without distal reconstitution or collateral vessels. This corresponds to the infarct territory 3. Mild narrowing of the left M1 segment as described. 4. Diffuse distal small vessel disease without other significant proximal stenosis or occlusion in the circle-of-Willis. 5. High-grade stenosis of the left internal carotid artery at the bifurcation. 6. Atherosclerotic changes involving the right carotid bifurcation without significant stenosis. 7. Extensive atherosclerotic disease at the aortic arch without significant stenosis. 8. 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 It is reconstituted at C1-2 level. 9. Multilevel degenerative changes in the cervical spine These results were called by telephone at the time of interpretation on 06/21/2018 at 12:52 pm to Dr. Alexis Goodell , who verbally  acknowledged these results. Electronically Signed   By: San Morelle M.D.   On: 06/21/2018 13:01   Ct Head Wo Contrast  Result Date: 06/22/2018 CLINICAL DATA:  Focal neuro deficit, less than 6 hours. TPA 24 hours ago. EXAM: CT HEAD WITHOUT CONTRAST TECHNIQUE: Contiguous axial images were obtained from the base of the skull through the vertex without intravenous contrast. COMPARISON:  CT head without contrast 06/21/2018. CTA head and neck and CT perfusion head 06/21/2018. FINDINGS: Brain: A large right MCA territory infarct corresponds to the area of core infarct on the CT perfusion exam. There is involvement of the right frontal operculum and insular cortex. Basal ganglia are seen. Right ACA territory is spared. There is local mass effect with effacement of the adjacent sulci. There is no midline shift. There is partial effacement of the right lateral ventricle. Left lateral ventricle is within normal limits. Brainstem and cerebellum are normal. There is no hemorrhage into the infarct. Vascular: Atherosclerotic calcifications are present within the cavernous internal carotid arteries bilaterally. There is no hyperdense vessel. Skull: Calvarium is intact. No focal lytic or blastic lesions are present. Sinuses/Orbits: The paranasal sinuses and mastoid air cells are clear. Bilateral lens replacements are present. Globes and orbits are otherwise within normal limits. IMPRESSION: 1. Involving nonhemorrhagic right MCA territory infarct corresponds with the area predicted by CT perfusion. 2. No hemorrhage within the infarct. 3. Local mass effect without significant midline shift. 4. Generalized atrophy and white matter disease is otherwise stable. Electronically Signed   By: San Morelle M.D.   On: 06/22/2018 12:09   Ct Angio Neck W Or Wo Contrast  Result Date: 06/21/2018 CLINICAL DATA:  Stroke, follow-up. Acute onset of left-sided weakness and facial droop with ice fixed to the right. EXAM: CT  ANGIOGRAPHY HEAD AND NECK CT PERFUSION BRAIN TECHNIQUE: Multidetector CT imaging of the head and neck was performed using the standard protocol during bolus administration of intravenous contrast. Multiplanar CT image reconstructions and MIPs were obtained to evaluate the vascular anatomy. Carotid stenosis measurements (when applicable) are obtained utilizing NASCET criteria, using the distal internal carotid diameter as the denominator. Multiphase CT imaging of the brain was performed following IV bolus contrast injection. Subsequent parametric perfusion maps were calculated using RAPID software. CONTRAST:  165mL ISOVUE-370 IOPAMIDOL (ISOVUE-370) INJECTION 76% COMPARISON:  CT head without contrast from the same day. MRI brain and MRA circle-of-Willis 01/23/2018. FINDINGS: CTA NECK FINDINGS Aortic arch: A 3 vessel arch configuration is present. Extensive atherosclerotic calcifications are present without a significant stenosis of the great vessel origins. There is no aneurysm. Right carotid system: Atherosclerotic calcifications are present along the wall of the right common carotid artery without a significant stenosis. There is calcified  and noncalcified plaque through the right carotid bifurcation. There is no focal luminal stenosis relative to the more distal vessel. The cervical right ICA is within normal limits. Left carotid system: The left common carotid artery demonstrates mural calcifications without a significant luminal stenosis. Dense calcifications are present at the left carotid bifurcation. The lumen is narrowed to less than 1 mm. Additional distal calcifications are present in the proximal left ICA without a significant tandem stenosis. Vertebral arteries: The right vertebral artery originates from the subclavian artery without significant stenosis. There is some tortuosity and calcification of the cervical right ICA without a significant stenosis. The left vertebral artery is occluded below the  C1-2 level. Skeleton: Multilevel degenerative changes are noted in the cervical spine. Degenerative anterolisthesis is present at C3-4 and to a greater extent at C4-5. There is chronic loss of disc space at C5-6 and C6-7. No focal lytic or blastic lesions are present. Alignment is otherwise anatomic. Other neck: The soft tissues the neck are otherwise unremarkable. There is no significant adenopathy. Thyroid is within normal limits. Salivary glands are unremarkable. Upper chest: Mild dependent atelectasis is present. The upper lung fields are otherwise clear without focal nodule, mass, or airspace disease. Ill-defined density in the left upper lobe is stable, likely remote scarring. Review of the MIP images confirms the above findings CTA HEAD FINDINGS Anterior circulation: Atherosclerotic calcifications are present through the cavernous internal carotid arteries bilaterally. There is no significant luminal stenosis relative to the ICA termini. The right A1 segment is dominant. There is mild narrowing of the proximal left M1 segment. The middle right M2 branch is occluded. There is distal left MCA atherosclerotic disease without focal stenosis or occlusion. Significant attenuation of branch vessels in the distal right MCA territory. No significant collaterals are present. Posterior circulation: The left vertebral artery is reconstituted at C1. Dense calcification is present at the dural margin of the vertebral artery on the left. The distal vertebral artery is opacified. The right vertebral artery is within normal limits. PICA origin is high. Basilar artery is within normal limits. Both posterior cerebral arteries originate from the basilar tip. There is mild distal small vessel disease without a significant proximal stenosis or occlusion. Venous sinuses: The dural sinuses are patent. The right transverse sinus is dominant. The straight sinus and deep cerebral veins are intact. Cortical veins are unremarkable.  Anatomic variants: None Delayed phase: Postcontrast images demonstrate early loss of gray-white differentiation in the anterior right frontal lobe. Review of the MIP images confirms the above findings CT Brain Perfusion Findings: CBF (<30%) Volume: 87mL Perfusion (Tmax>6.0s) volume: 116mL Mismatch Volume: 77mL Infarction Location:Right frontal lobe IMPRESSION: 1. Acute infarct of the right frontal lobe. Core infarct volume is 91 mL. 2. Occluded right M2 branch at the MCA bifurcation without distal reconstitution or collateral vessels. This corresponds to the infarct territory 3. Mild narrowing of the left M1 segment as described. 4. Diffuse distal small vessel disease without other significant proximal stenosis or occlusion in the circle-of-Willis. 5. High-grade stenosis of the left internal carotid artery at the bifurcation. 6. Atherosclerotic changes involving the right carotid bifurcation without significant stenosis. 7. Extensive atherosclerotic disease at the aortic arch without significant stenosis. 8. 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 It is reconstituted at C1-2 level. 9. Multilevel degenerative changes in the cervical spine These results were called by telephone at the time of interpretation on 06/21/2018 at 12:52 pm to Dr. Alexis Goodell , who verbally acknowledged these results. Electronically Signed   By:  San Morelle M.D.   On: 06/21/2018 13:01   Ct Cerebral Perfusion W Contrast  Result Date: 06/21/2018 CLINICAL DATA:  Stroke, follow-up. Acute onset of left-sided weakness and facial droop with ice fixed to the right. EXAM: CT ANGIOGRAPHY HEAD AND NECK CT PERFUSION BRAIN TECHNIQUE: Multidetector CT imaging of the head and neck was performed using the standard protocol during bolus administration of intravenous contrast. Multiplanar CT image reconstructions and MIPs were obtained to evaluate the vascular anatomy. Carotid stenosis measurements (when applicable) are obtained utilizing NASCET  criteria, using the distal internal carotid diameter as the denominator. Multiphase CT imaging of the brain was performed following IV bolus contrast injection. Subsequent parametric perfusion maps were calculated using RAPID software. CONTRAST:  149mL ISOVUE-370 IOPAMIDOL (ISOVUE-370) INJECTION 76% COMPARISON:  CT head without contrast from the same day. MRI brain and MRA circle-of-Willis 01/23/2018. FINDINGS: CTA NECK FINDINGS Aortic arch: A 3 vessel arch configuration is present. Extensive atherosclerotic calcifications are present without a significant stenosis of the great vessel origins. There is no aneurysm. Right carotid system: Atherosclerotic calcifications are present along the wall of the right common carotid artery without a significant stenosis. There is calcified and noncalcified plaque through the right carotid bifurcation. There is no focal luminal stenosis relative to the more distal vessel. The cervical right ICA is within normal limits. Left carotid system: The left common carotid artery demonstrates mural calcifications without a significant luminal stenosis. Dense calcifications are present at the left carotid bifurcation. The lumen is narrowed to less than 1 mm. Additional distal calcifications are present in the proximal left ICA without a significant tandem stenosis. Vertebral arteries: The right vertebral artery originates from the subclavian artery without significant stenosis. There is some tortuosity and calcification of the cervical right ICA without a significant stenosis. The left vertebral artery is occluded below the C1-2 level. Skeleton: Multilevel degenerative changes are noted in the cervical spine. Degenerative anterolisthesis is present at C3-4 and to a greater extent at C4-5. There is chronic loss of disc space at C5-6 and C6-7. No focal lytic or blastic lesions are present. Alignment is otherwise anatomic. Other neck: The soft tissues the neck are otherwise unremarkable.  There is no significant adenopathy. Thyroid is within normal limits. Salivary glands are unremarkable. Upper chest: Mild dependent atelectasis is present. The upper lung fields are otherwise clear without focal nodule, mass, or airspace disease. Ill-defined density in the left upper lobe is stable, likely remote scarring. Review of the MIP images confirms the above findings CTA HEAD FINDINGS Anterior circulation: Atherosclerotic calcifications are present through the cavernous internal carotid arteries bilaterally. There is no significant luminal stenosis relative to the ICA termini. The right A1 segment is dominant. There is mild narrowing of the proximal left M1 segment. The middle right M2 branch is occluded. There is distal left MCA atherosclerotic disease without focal stenosis or occlusion. Significant attenuation of branch vessels in the distal right MCA territory. No significant collaterals are present. Posterior circulation: The left vertebral artery is reconstituted at C1. Dense calcification is present at the dural margin of the vertebral artery on the left. The distal vertebral artery is opacified. The right vertebral artery is within normal limits. PICA origin is high. Basilar artery is within normal limits. Both posterior cerebral arteries originate from the basilar tip. There is mild distal small vessel disease without a significant proximal stenosis or occlusion. Venous sinuses: The dural sinuses are patent. The right transverse sinus is dominant. The straight sinus and deep cerebral  veins are intact. Cortical veins are unremarkable. Anatomic variants: None Delayed phase: Postcontrast images demonstrate early loss of gray-white differentiation in the anterior right frontal lobe. Review of the MIP images confirms the above findings CT Brain Perfusion Findings: CBF (<30%) Volume: 27mL Perfusion (Tmax>6.0s) volume: 177mL Mismatch Volume: 60mL Infarction Location:Right frontal lobe IMPRESSION: 1. Acute  infarct of the right frontal lobe. Core infarct volume is 91 mL. 2. Occluded right M2 branch at the MCA bifurcation without distal reconstitution or collateral vessels. This corresponds to the infarct territory 3. Mild narrowing of the left M1 segment as described. 4. Diffuse distal small vessel disease without other significant proximal stenosis or occlusion in the circle-of-Willis. 5. High-grade stenosis of the left internal carotid artery at the bifurcation. 6. Atherosclerotic changes involving the right carotid bifurcation without significant stenosis. 7. Extensive atherosclerotic disease at the aortic arch without significant stenosis. 8. 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 It is reconstituted at C1-2 level. 9. Multilevel degenerative changes in the cervical spine These results were called by telephone at the time of interpretation on 06/21/2018 at 12:52 pm to Dr. Alexis Goodell , who verbally acknowledged these results. Electronically Signed   By: San Morelle M.D.   On: 06/21/2018 13:01   Dg Chest Port 1 View  Result Date: 06/27/2018 CLINICAL DATA:  Leukocytosis. EXAM: PORTABLE CHEST 1 VIEW COMPARISON:  03/07/2018. FINDINGS: Normal sized heart. Tortuous aorta. Small right pleural effusion. Stable mildly prominent interstitial markings. Thoracolumbar spine degenerative changes. Bilateral shoulder degenerative changes with superior migration of the right humeral head and bony remodeling compatible with a chronic large right rotator cuff tear. Feeding tube extending into the stomach. IMPRESSION: 1. Small right pleural effusion. 2. Stable chronic interstitial lung disease. Electronically Signed   By: Claudie Revering M.D.   On: 06/27/2018 10:49   Ct Head Code Stroke Wo Contrast  Result Date: 06/21/2018 CLINICAL DATA:  Code stroke. Focal neuro deficit, less than 6 hours, stroke suspected. Patient found unresponsive 1 hour ago. Acute onset of left-sided weakness and facial droop with ice fixed to the  right. EXAM: CT HEAD WITHOUT CONTRAST TECHNIQUE: Contiguous axial images were obtained from the base of the skull through the vertex without intravenous contrast. COMPARISON:  CT head without contrast 02/22/2018 FINDINGS: Brain: Atrophy and moderate diffuse white matter changes are again seen bilaterally. No acute cortical infarct, hemorrhage, or mass lesion is present. The basal ganglia are intact. Insular ribbon is normal bilaterally. No acute or focal right-sided infarct is present. The ventricles are proportionate to the degree of atrophy and stable. The brainstem and cerebellum are normal. Vascular: Atherosclerotic calcifications are present in the cavernous and precavernous internal carotid arteries. There is no hyperdense vessel. Skull: Calvarium is intact. No focal lytic or blastic lesions are present. Sinuses/Orbits: Paranasal sinuses and mastoid air cells are clear. Bilateral lens replacements are present. Globes and orbits are otherwise within normal limits. ASPECTS [facility] Stroke Program Early CT Score) - Ganglionic level infarction (caudate, lentiform nuclei, internal capsule, insula, M1-M3 cortex): 7/7 - Supraganglionic infarction (M4-M6 cortex): 3/3 Total score (0-10 with 10 being normal): 10/10 IMPRESSION: 1. No acute intracranial abnormality. 2. ASPECTS is 10/10 3. Moderate atrophy and white matter disease is stable bilaterally. These results were called by telephone at the time of interpretation on 06/21/2018 at 11:03 am to Dr. Harvest Dark , who verbally acknowledged these results. Electronically Signed   By: San Morelle M.D.   On: 06/21/2018 11:05   Transthoracic Echocardiogram  06/22/2018 Study Conclusions - Left  ventricle: The cavity size was normal. Systolic function was vigorous. The estimated ejection fraction was in the range of 65% to 70%. Wall motion was normal; there were no regional wall motion abnormalities. Features are consistent with a pseudonormal left  ventricular filling pattern, with concomitant abnormal relaxation and increased filling pressure (grade 2 diastolic dysfunction). Doppler parameters are consistent with high ventricular filling pressure. - Aortic valve: Poorly visualized. Severely calcified annulus. Trileaflet; moderately thickened, moderately calcified leaflets. - Mitral valve: Severely calcified annulus. Moderately thickened, mildly calcified leaflets anterior. There was mild to moderate regurgitation. - Atrial septum: There was increased thickness of the septum, consistent with lipomatous hypertrophy. Tricuspid valve: There was mild regurgitation. - Pulmonic valve: There was mild regurgitation.  PEG placement 06/28/2018 [facility])   PHYSICAL EXAM  General - cachectic, well developed, in no apparent distress, mouth breathing (dry tongue, green residue in mouth), sleeping with eyes opened (light yellow mucous in corners).  Cardiovascular - Regular rate and rhythm, not in A. fib.  Neuro - lethargic post PEG, will awaken to voice/touch. Answers simple Y/N questions appropriately. Able to follow simple commands. Severe dysarthria. able to make sound out today but not sentences.  Right gaze preference, however able to cross midline, PERRL, not blinking to visual threat on the left.  Left facial droop mild, tongue midline.  Left upper extremity 1/5 with pain summation, left lower extremity 0/5 proximal and 2/5 distally.  Right upper and lower extremity 3/5. Diffuse ecchymoses BUEs, L>R. DTR 1+, no Babinski. Sensation intact. Coordination not cooperative and gait not tested.   ASSESSMENT/PLAN Ms. Jackalyn Hanks Goodell is a 84 y.o. female with history of LLE PVD with stenting, hip surgery, TIA, cancer, and HTN presenting with left sided weakness. The patient received [facility] at [facility] Friday AM 06/21/2018.  Stroke: Large right MCA infarct s/p IV tPA - embolic due to A. fib/aflutter not on AC.  Resultant  Left side hemiplegia,  right gaze preference  CT head - No acute intracranial abnormality.   CTA H&N - Occluded right M2 branch. High-grade stenosis of the left internal carotid furcation  CT perfusion - large infarct core   MRI head - not performed due to "metal in body" as per family  2D Echo - EF 65% to 70%. No cardiac source of emboli identified.  LDL 42  HgbA1c - 5.7  VTE prophylaxis - subq heparin  aspirin 81 mg daily and clopidogrel 75 mg daily prior to admission, now aspirin 325.  Will start anticoagulation at 7-10 days post stroke (around 06/29/18) given large size of stroke  Ongoing aggressive stroke risk factor management  Therapy recommendations:  SNF   Disposition:  Pending. Anticipate medically ready once stable post PEG placement with plans to d/c once SNFbed found.  Lethargy   Post PEG placement  Will check CT head  A. fib/a flutter not on AC  As per family, patient has diagnosed with A. fib in the past not on [facility], but on aspirin and Plavix  AF confirmed in ICU  On aspirin 325  On Metoprolol  Rate controlled  We will start anticoagulation at 7-10 days after stroke (around 06/29/18) given large size of the stroke  Hyperlipidemia  LDL 42, goal LDL < 70   Lipitor 20 PTA, resumed in hospital  Continue statin at d/c  Hypertension  Stable on the high end  Treated post tPA w/ cleviprex . SBP goal < 160  . Resumed home BP medications including Norvasc and clonidine.   Marland Kitchen  Added metoprolol, increase to 25mg  bid . Long-term BP goal normotensive  Dysphagia  Did not pass swallow  PEG tube placed Friday   Trauma team consulted  Speech on board  NT suctioning   Mucomyst neb  Mouth care  Other Stroke Risk Factors  Advanced age  Former cigarette smoker - quit  Other Active Problems  Elevated creatinine, resolved 0.95  Hypokalemia, resolved - supplement 4.4  Leukocytosis - 8.6->12.2->13.5. Etiology unclear. CXR w/ sm R pleural effusion. UA pending    Dietitian consult 06/23/2018 - Severe malnutrition in context of chronic illness.  Palliative care on board. Plans for full treatment  No BM documented since admission, will give miralax   Eyes - some yellow drainage, sleeps with eyes opened - will give Lacrilube at hs and artifical tears during the day (uses zaditar OTC eye drops prn at home)   Hospital day # [place], MSN, APRN, ANVP-BC, AGPCNP-BC Advanced Practice Stroke Nurse [place] for Schedule & Pager information 06/28/2018 8:46 AM   ATTENDING NOTE: I reviewed above note and agree with the assessment and plan. I have made any additions or clarifications directly to the above note. Pt was seen and examined.   Patient lying in bed, one female family member at bedside.  She had a PEG tube placed this morning, cortrak was removed. Will start tube feeding today as per trauma team instructions. Patient awake alert, interactive.  However still has dysphagia and dysarthria with hypophonia.  Repeat head CT showed inclusion of right large MCA infarct with petechial hemorrhage and minimum midline shift.  Leukocytosis improved.  UA still pending, CXR unremarkable.  Still has left hemiplegia.  Prepared to discharge early next week.  Rosalin Hawking, MD PhD Stroke Neurology 06/28/2018 9:19 PM     To contact Stroke Continuity provider, please refer to [email or web address] After hours, contact General Neurology

## 2018-06-29 DIAGNOSIS — I63511 Cerebral infarction due to unspecified occlusion or stenosis of right middle cerebral artery: Secondary | ICD-10-CM

## 2018-06-29 LAB — GLUCOSE, CAPILLARY
GLUCOSE-CAPILLARY: 90 mg/dL (ref 70–99)
GLUCOSE-CAPILLARY: 167 mg/dL — AB (ref 70–99)
Glucose-Capillary: 88 mg/dL (ref 70–99)
Glucose-Capillary: 109 mg/dL — ABNORMAL HIGH (ref 70–99)

## 2018-06-29 LAB — BASIC METABOLIC PANEL
ANION GAP: 12 (ref 5–15)
ANION GAP: 8 (ref 5–15)
BUN: 27 mg/dL — ABNORMAL HIGH (ref 8–23)
BUN: 31 mg/dL — ABNORMAL HIGH (ref 8–23)
CALCIUM: 9 mg/dL (ref 8.9–10.3)
CHLORIDE: 103 mmol/L (ref 98–111)
CHLORIDE: 103 mmol/L (ref 98–111)
CO2: 23 mmol/L (ref 22–32)
CO2: 24 mmol/L (ref 22–32)
CREATININE: 0.9 mg/dL (ref 0.44–1.00)
Calcium: 8.2 mg/dL — ABNORMAL LOW (ref 8.9–10.3)
Creatinine, Ser: 0.97 mg/dL (ref 0.44–1.00)
GFR calc Af Amer: >60 mL/min (ref >60)
GFR calc non Af Amer: 56 mL/min — ABNORMAL LOW (ref >60)
GFR calc non Af Amer: 51 mL/min — ABNORMAL LOW (ref >60)
GFR, EST AFRICAN AMERICAN: 59 mL/min — AB (ref >60)
GLUCOSE: 135 mg/dL — AB (ref 70–99)
Glucose, Bld: 91 mg/dL (ref 70–99)
POTASSIUM: 4.6 mmol/L (ref 3.5–5.1)
Potassium: 4.7 mmol/L (ref 3.5–5.1)
SODIUM: 138 mmol/L (ref 135–145)
Sodium: 135 mmol/L (ref 135–145)

## 2018-06-29 LAB — CBC
HEMATOCRIT: 35.5 % — AB (ref 36.0–46.0)
HEMATOCRIT: 30.6 % — AB (ref 36.0–46.0)
HEMOGLOBIN: 10.8 g/dL — AB (ref 12.0–15.0)
HEMOGLOBIN: 9.5 g/dL — AB (ref 12.0–15.0)
MCH: 27.6 pg (ref 26.0–34.0)
MCH: 28 pg (ref 26.0–34.0)
MCHC: 30.4 g/dL (ref 30.0–36.0)
MCHC: 31 g/dL (ref 30.0–36.0)
MCV: 90.6 fL (ref 78.0–100.0)
MCV: 90.3 fL (ref 78.0–100.0)
Platelets: 345 10*3/uL (ref 150–400)
Platelets: 279 10*3/uL (ref 150–400)
RBC: 3.92 MIL/uL (ref 3.87–5.11)
RBC: 3.39 MIL/uL — AB (ref 3.87–5.11)
RDW: 16.2 % — AB (ref 11.5–15.5)
RDW: 15.7 % — ABNORMAL HIGH (ref 11.5–15.5)
WBC: 13.7 10*3/uL — AB (ref 4.0–10.5)
WBC: 11.2 10*3/uL — ABNORMAL HIGH (ref 4.0–10.5)

## 2018-06-29 MED ORDER — ACETAMINOPHEN 160 MG/5ML PO SOLN
650.0000 mg | Freq: Four times a day (QID) | ORAL | Status: DC | PRN
  Administered 2018-06-30 – 2018-07-01 (×3): 650 mg
  Filled 2018-06-29 (×3): qty 20.3

## 2018-06-29 MED ORDER — SODIUM CHLORIDE 0.9 % IV BOLUS
250.0000 mL | Freq: Once | INTRAVENOUS | Status: AC
  Administered 2018-06-29: 250 mL via INTRAVENOUS

## 2018-06-29 NOTE — Progress Notes (Signed)
[facility]/Trauma Progress Note  1 Day Post-Op   Assessment/Plan Active Problems:   Acute right arterial ischemic stroke, middle cerebral artery (MCA) (HCC)   Goals of care, counseling/discussion   Palliative care encounter   Excessive oral secretions   Hypokalemia   Paroxysmal atrial fibrillation (HCC)   Oropharyngeal dysphagia  S/P PEG, Dr. Hulen Skains, 08/02 - okay for tube feeds   DISPO: abdominal binder not in place and TF not started per orders yesterday. Spoke with charge nurse about filing a safety zone. Abdominal binder must be on at all times loosely to protect PEG. I suspect the PEG got pulled on which caused it to bleed. It appears the bleeding is controlled and it is okay to use PEG. We will sign off at this time. Please page [place] with any concerns regarding her PEG.     LOS: 8 days    Subjective: CC: S/P PEG  No abdominal pain, no nausea. Pt has no complaints. She is resting but alert and responsive.   Objective: Vital signs in last 24 hours: Temp:  [97.7 F (36.5 C)-99.3 F (37.4 C)] 98.3 F (36.8 C) (08/03 0759) Pulse Rate:  [80-108] 84 (08/03 0759) Resp:  [15-28] 16 (08/03 0759) BP: (147-191)/(64-114) 147/70 (08/03 0759) SpO2:  [87 %-100 %] 97 % (08/03 0759) Weight:  [46.2 kg (101 lb 13.6 oz)-52.4 kg (115 lb 8.3 oz)] 52.4 kg (115 lb 8.3 oz) (08/03 0506) Last BM Date: 06/25/18  Intake/Output from previous day: 08/02 0701 - 08/03 0700 In: 2175.3 [I.V.:1020.3; NG/GT:1155] Out: -  Intake/Output this shift: No intake/output data recorded.  PE: Gen:  Alert, NAD, pleasant, cooperative Pulm:  Rate and effort normal Abd: Soft, NT/ND, +BS, PEG in place with clotted blood around dressing. No active bleeding noted. No abdominal binder on pt. Skin: no rashes noted, warm and dry   Anti-infectives: Anti-infectives (From admission, onward)   None      Lab Results:  Recent Labs    06/28/18 0536 06/29/18 0438  WBC 11.5* 13.7*  HGB 10.6* 10.8*   HCT 33.6* 35.5*  PLT 353 345   BMET Recent Labs    06/28/18 0536 06/29/18 0438  NA 138 138  K 4.5 4.7  CL 102 103  CO2 25 23  GLUCOSE 95 91  BUN 33* 27*  CREATININE 0.96 0.90  CALCIUM 8.8* 9.0   PT/INR No results for input(s): LABPROT, INR in the last 72 hours. CMP     Component Value Date/Time   NA 138 06/29/2018 0438   K 4.7 06/29/2018 0438   CL 103 06/29/2018 0438   CO2 23 06/29/2018 0438   GLUCOSE 91 06/29/2018 0438   BUN 27 (H) 06/29/2018 0438   CREATININE 0.90 06/29/2018 0438   CREATININE 0.92 03/13/2012 0805   CALCIUM 9.0 06/29/2018 0438   PROT 8.1 06/21/2018 1102   ALBUMIN 3.2 (L) 06/21/2018 1102   AST 35 06/21/2018 1102   ALT 13 06/21/2018 1102   ALKPHOS 123 06/21/2018 1102   BILITOT 0.4 06/21/2018 1102   GFRNONAA 56 (L) 06/29/2018 0438   GFRNONAA 58 (L) 03/13/2012 0805   GFRAA >60 06/29/2018 0438   GFRAA >60 03/13/2012 0805   Lipase  No results found for: LIPASE  Studies/Results: Ct Head Wo Contrast  Result Date: 06/28/2018 CLINICAL DATA:  Stroke follow-up EXAM: CT HEAD WITHOUT CONTRAST TECHNIQUE: Contiguous axial images were obtained from the base of the skull through the vertex without intravenous contrast. COMPARISON:  06/22/2018 FINDINGS: Brain: Continued evolution of right  MCA territory large infarct. There is a suspected small focus of petechial hemorrhage measuring approximately 4 x 2 mm 3 mm of leftward midline shift, slightly greater than on the prior study. Size and configuration of the ventricles are unchanged. Old left basal ganglia small vessel infarct is unchanged. There is hypoattenuation of the periventricular white matter, most commonly indicating chronic ischemic microangiopathy. Vascular: Atherosclerotic calcification of the internal carotid arteries at the skull base. No abnormal hyperdensity of the major intracranial arteries or dural venous sinuses. Skull: The visualized skull base, calvarium and extracranial soft tissues are normal.  Sinuses/Orbits: No fluid levels or advanced mucosal thickening of the visualized paranasal sinuses. No mastoid or middle ear effusion. The orbits are normal. IMPRESSION: 1. Continued evolution of right MCA infarct with progression of cytotoxic edema and minimally increased leftward midline shift. 2. Suspected 4 x 2 mm focus of petechial hemorrhage within the infarcted area. No hemorrhagic conversion. These results were called by telephone at the time of interpretation on 06/28/2018 at 8:18 pm to Dr. Rory Percy , who verbally acknowledged these results. Electronically Signed   By: Ulyses Jarred M.D.   On: 06/28/2018 20:18   Dg Chest Port 1 View  Result Date: 06/27/2018 CLINICAL DATA:  Leukocytosis. EXAM: PORTABLE CHEST 1 VIEW COMPARISON:  03/07/2018. FINDINGS: Normal sized heart. Tortuous aorta. Small right pleural effusion. Stable mildly prominent interstitial markings. Thoracolumbar spine degenerative changes. Bilateral shoulder degenerative changes with superior migration of the right humeral head and bony remodeling compatible with a chronic large right rotator cuff tear. Feeding tube extending into the stomach. IMPRESSION: 1. Small right pleural effusion. 2. Stable chronic interstitial lung disease. Electronically Signed   By: Claudie Revering M.D.   On: 06/27/2018 10:49      Kalman Drape , [facility] 06/29/2018, 8:01 AM  Pager: [phone] Mon-Wed, Friday 7:00am-4:30pm Thurs 7am-11:30am  Consults: [phone]

## 2018-06-29 NOTE — Progress Notes (Signed)
Will give patient PRN labetalol for SBP > 180

## 2018-06-29 NOTE — Progress Notes (Signed)
STROKE TEAM PROGRESS NOTE   SUBJECTIVE (INTERVAL HISTORY) Patient son is at bedside. Pt recently PEG tube placement, some bleeding around the PEG but resolved, tube feeds restarted. She will answer questions and follow simple commends. Extremity strength unchanged. Denies pain or discomfort. Son concerned about left arm skin tears, being addressed.   OBJECTIVE Temp:  [97.7 F (36.5 C)-99.3 F (37.4 C)] 98.3 F (36.8 C) (08/03 0759) Pulse Rate:  [80-108] 84 (08/03 0759) Cardiac Rhythm: Normal sinus rhythm (08/02 2000) Resp:  [15-28] 16 (08/03 0759) BP: (147-191)/(64-114) 147/70 (08/03 0759) SpO2:  [87 %-100 %] 97 % (08/03 0759) Weight:  [101 lb 13.6 oz (46.2 kg)-115 lb 8.3 oz (52.4 kg)] 115 lb 8.3 oz (52.4 kg) (08/03 0506)  CBC:  Recent Labs  Lab 06/28/18 0536 06/29/18 0438  WBC 11.5* 13.7*  HGB 10.6* 10.8*  HCT 33.6* 35.5*  MCV 89.1 90.6  PLT 353 237    Basic Metabolic Panel:  Recent Labs  Lab 06/24/18 1703 06/25/18 0412  06/28/18 0536 06/29/18 0438  NA  --  142   < > 138 138  K  --  2.9*   < > 4.5 4.7  CL  --  109   < > 102 103  CO2  --  22   < > 25 23  GLUCOSE  --  127*   < > 95 91  BUN  --  40*   < > 33* 27*  CREATININE  --  1.10*   < > 0.96 0.90  CALCIUM  --  8.7*   < > 8.8* 9.0  MG 2.2 2.2  --   --   --   PHOS 4.3 3.6  --   --   --    < > = values in this interval not displayed.    Lipid Panel:     Component Value Date/Time   CHOL 103 06/22/2018 0151   TRIG 67 06/22/2018 0151   HDL 48 06/22/2018 0151   CHOLHDL 2.1 06/22/2018 0151   VLDL 13 06/22/2018 0151   LDLCALC 42 06/22/2018 0151   HgbA1c:  Lab Results  Component Value Date   HGBA1C 5.7 (H) 06/22/2018   Urine Drug Screen:     Component Value Date/Time   LABOPIA NONE DETECTED 01/22/2018 1700   COCAINSCRNUR NONE DETECTED 01/22/2018 1700   LABBENZ NONE DETECTED 01/22/2018 1700   AMPHETMU NONE DETECTED 01/22/2018 1700   THCU NONE DETECTED 01/22/2018 1700   LABBARB NONE DETECTED 01/22/2018  1700    Alcohol Level     Component Value Date/Time   ETH <10 01/22/2018 2013    IMAGING  Ct Angio Head W Or Wo Contrast  Result Date: 06/21/2018 CLINICAL DATA:  Stroke, follow-up. Acute onset of left-sided weakness and facial droop with ice fixed to the right. EXAM: CT ANGIOGRAPHY HEAD AND NECK CT PERFUSION BRAIN TECHNIQUE: Multidetector CT imaging of the head and neck was performed using the standard protocol during bolus administration of intravenous contrast. Multiplanar CT image reconstructions and MIPs were obtained to evaluate the vascular anatomy. Carotid stenosis measurements (when applicable) are obtained utilizing NASCET criteria, using the distal internal carotid diameter as the denominator. Multiphase CT imaging of the brain was performed following IV bolus contrast injection. Subsequent parametric perfusion maps were calculated using RAPID software. CONTRAST:  171mL ISOVUE-370 IOPAMIDOL (ISOVUE-370) INJECTION 76% COMPARISON:  CT head without contrast from the same day. MRI brain and MRA circle-of-Willis 01/23/2018. FINDINGS: CTA NECK FINDINGS Aortic arch: A 3 vessel arch  configuration is present. Extensive atherosclerotic calcifications are present without a significant stenosis of the great vessel origins. There is no aneurysm. Right carotid system: Atherosclerotic calcifications are present along the wall of the right common carotid artery without a significant stenosis. There is calcified and noncalcified plaque through the right carotid bifurcation. There is no focal luminal stenosis relative to the more distal vessel. The cervical right ICA is within normal limits. Left carotid system: The left common carotid artery demonstrates mural calcifications without a significant luminal stenosis. Dense calcifications are present at the left carotid bifurcation. The lumen is narrowed to less than 1 mm. Additional distal calcifications are present in the proximal left ICA without a significant  tandem stenosis. Vertebral arteries: The right vertebral artery originates from the subclavian artery without significant stenosis. There is some tortuosity and calcification of the cervical right ICA without a significant stenosis. The left vertebral artery is occluded below the C1-2 level. Skeleton: Multilevel degenerative changes are noted in the cervical spine. Degenerative anterolisthesis is present at C3-4 and to a greater extent at C4-5. There is chronic loss of disc space at C5-6 and C6-7. No focal lytic or blastic lesions are present. Alignment is otherwise anatomic. Other neck: The soft tissues the neck are otherwise unremarkable. There is no significant adenopathy. Thyroid is within normal limits. Salivary glands are unremarkable. Upper chest: Mild dependent atelectasis is present. The upper lung fields are otherwise clear without focal nodule, mass, or airspace disease. [name]-defined density in the left upper lobe is stable, likely remote scarring. Review of the MIP images confirms the above findings CTA HEAD FINDINGS Anterior circulation: Atherosclerotic calcifications are present through the cavernous internal carotid arteries bilaterally. There is no significant luminal stenosis relative to the ICA termini. The right A1 segment is dominant. There is mild narrowing of the proximal left M1 segment. The middle right M2 branch is occluded. There is distal left MCA atherosclerotic disease without focal stenosis or occlusion. Significant attenuation of branch vessels in the distal right MCA territory. No significant collaterals are present. Posterior circulation: The left vertebral artery is reconstituted at C1. Dense calcification is present at the dural margin of the vertebral artery on the left. The distal vertebral artery is opacified. The right vertebral artery is within normal limits. PICA origin is high. Basilar artery is within normal limits. Both posterior cerebral arteries originate from the basilar  tip. There is mild distal small vessel disease without a significant proximal stenosis or occlusion. Venous sinuses: The dural sinuses are patent. The right transverse sinus is dominant. The straight sinus and deep cerebral veins are intact. Cortical veins are unremarkable. Anatomic variants: None Delayed phase: Postcontrast images demonstrate early loss of gray-white differentiation in the anterior right frontal lobe. Review of the MIP images confirms the above findings CT Brain Perfusion Findings: CBF (<30%) Volume: 40mL Perfusion (Tmax>6.0s) volume: 129mL Mismatch Volume: 25mL Infarction Location:Right frontal lobe IMPRESSION: 1. Acute infarct of the right frontal lobe. Core infarct volume is 91 mL. 2. Occluded right M2 branch at the MCA bifurcation without distal reconstitution or collateral vessels. This corresponds to the infarct territory 3. Mild narrowing of the left M1 segment as described. 4. Diffuse distal small vessel disease without other significant proximal stenosis or occlusion in the circle-of-Willis. 5. High-grade stenosis of the left internal carotid artery at the bifurcation. 6. Atherosclerotic changes involving the right carotid bifurcation without significant stenosis. 7. Extensive atherosclerotic disease at the aortic arch without significant stenosis. 8. 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 It  is reconstituted at C1-2 level. 9. Multilevel degenerative changes in the cervical spine These results were called by telephone at the time of interpretation on 06/21/2018 at 12:52 pm to Dr. Alexis Goodell , who verbally acknowledged these results. Electronically Signed   By: San Morelle M.D.   On: 06/21/2018 13:01   Ct Head Wo Contrast  Result Date: 06/28/2018 CLINICAL DATA:  Stroke follow-up EXAM: CT HEAD WITHOUT CONTRAST TECHNIQUE: Contiguous axial images were obtained from the base of the skull through the vertex without intravenous contrast. COMPARISON:  06/22/2018 FINDINGS: Brain:  Continued evolution of right MCA territory large infarct. There is a suspected small focus of petechial hemorrhage measuring approximately 4 x 2 mm 3 mm of leftward midline shift, slightly greater than on the prior study. Size and configuration of the ventricles are unchanged. Old left basal ganglia small vessel infarct is unchanged. There is hypoattenuation of the periventricular white matter, most commonly indicating chronic ischemic microangiopathy. Vascular: Atherosclerotic calcification of the internal carotid arteries at the skull base. No abnormal hyperdensity of the major intracranial arteries or dural venous sinuses. Skull: The visualized skull base, calvarium and extracranial soft tissues are normal. Sinuses/Orbits: No fluid levels or advanced mucosal thickening of the visualized paranasal sinuses. No mastoid or middle ear effusion. The orbits are normal. IMPRESSION: 1. Continued evolution of right MCA infarct with progression of cytotoxic edema and minimally increased leftward midline shift. 2. Suspected 4 x 2 mm focus of petechial hemorrhage within the infarcted area. No hemorrhagic conversion. These results were called by telephone at the time of interpretation on 06/28/2018 at 8:18 pm to Dr. Rory Percy , who verbally acknowledged these results. Electronically Signed   By: Ulyses Jarred M.D.   On: 06/28/2018 20:18   Ct Head Wo Contrast  Result Date: 06/22/2018 CLINICAL DATA:  Focal neuro deficit, less than 6 hours. TPA 24 hours ago. EXAM: CT HEAD WITHOUT CONTRAST TECHNIQUE: Contiguous axial images were obtained from the base of the skull through the vertex without intravenous contrast. COMPARISON:  CT head without contrast 06/21/2018. CTA head and neck and CT perfusion head 06/21/2018. FINDINGS: Brain: A large right MCA territory infarct corresponds to the area of core infarct on the CT perfusion exam. There is involvement of the right frontal operculum and insular cortex. Basal ganglia are seen. Right ACA  territory is spared. There is local mass effect with effacement of the adjacent sulci. There is no midline shift. There is partial effacement of the right lateral ventricle. Left lateral ventricle is within normal limits. Brainstem and cerebellum are normal. There is no hemorrhage into the infarct. Vascular: Atherosclerotic calcifications are present within the cavernous internal carotid arteries bilaterally. There is no hyperdense vessel. Skull: Calvarium is intact. No focal lytic or blastic lesions are present. Sinuses/Orbits: The paranasal sinuses and mastoid air cells are clear. Bilateral lens replacements are present. Globes and orbits are otherwise within normal limits. IMPRESSION: 1. Involving nonhemorrhagic right MCA territory infarct corresponds with the area predicted by CT perfusion. 2. No hemorrhage within the infarct. 3. Local mass effect without significant midline shift. 4. Generalized atrophy and white matter disease is otherwise stable. Electronically Signed   By: San Morelle M.D.   On: 06/22/2018 12:09   Ct Angio Neck W Or Wo Contrast  Result Date: 06/21/2018 CLINICAL DATA:  Stroke, follow-up. Acute onset of left-sided weakness and facial droop with ice fixed to the right. EXAM: CT ANGIOGRAPHY HEAD AND NECK CT PERFUSION BRAIN TECHNIQUE: Multidetector CT imaging of the head  and neck was performed using the standard protocol during bolus administration of intravenous contrast. Multiplanar CT image reconstructions and MIPs were obtained to evaluate the vascular anatomy. Carotid stenosis measurements (when applicable) are obtained utilizing NASCET criteria, using the distal internal carotid diameter as the denominator. Multiphase CT imaging of the brain was performed following IV bolus contrast injection. Subsequent parametric perfusion maps were calculated using RAPID software. CONTRAST:  114mL ISOVUE-370 IOPAMIDOL (ISOVUE-370) INJECTION 76% COMPARISON:  CT head without contrast from the  same day. MRI brain and MRA circle-of-Willis 01/23/2018. FINDINGS: CTA NECK FINDINGS Aortic arch: A 3 vessel arch configuration is present. Extensive atherosclerotic calcifications are present without a significant stenosis of the great vessel origins. There is no aneurysm. Right carotid system: Atherosclerotic calcifications are present along the wall of the right common carotid artery without a significant stenosis. There is calcified and noncalcified plaque through the right carotid bifurcation. There is no focal luminal stenosis relative to the more distal vessel. The cervical right ICA is within normal limits. Left carotid system: The left common carotid artery demonstrates mural calcifications without a significant luminal stenosis. Dense calcifications are present at the left carotid bifurcation. The lumen is narrowed to less than 1 mm. Additional distal calcifications are present in the proximal left ICA without a significant tandem stenosis. Vertebral arteries: The right vertebral artery originates from the subclavian artery without significant stenosis. There is some tortuosity and calcification of the cervical right ICA without a significant stenosis. The left vertebral artery is occluded below the C1-2 level. Skeleton: Multilevel degenerative changes are noted in the cervical spine. Degenerative anterolisthesis is present at C3-4 and to a greater extent at C4-5. There is chronic loss of disc space at C5-6 and C6-7. No focal lytic or blastic lesions are present. Alignment is otherwise anatomic. Other neck: The soft tissues the neck are otherwise unremarkable. There is no significant adenopathy. Thyroid is within normal limits. Salivary glands are unremarkable. Upper chest: Mild dependent atelectasis is present. The upper lung fields are otherwise clear without focal nodule, mass, or airspace disease. [name]-defined density in the left upper lobe is stable, likely remote scarring. Review of the MIP images  confirms the above findings CTA HEAD FINDINGS Anterior circulation: Atherosclerotic calcifications are present through the cavernous internal carotid arteries bilaterally. There is no significant luminal stenosis relative to the ICA termini. The right A1 segment is dominant. There is mild narrowing of the proximal left M1 segment. The middle right M2 branch is occluded. There is distal left MCA atherosclerotic disease without focal stenosis or occlusion. Significant attenuation of branch vessels in the distal right MCA territory. No significant collaterals are present. Posterior circulation: The left vertebral artery is reconstituted at C1. Dense calcification is present at the dural margin of the vertebral artery on the left. The distal vertebral artery is opacified. The right vertebral artery is within normal limits. PICA origin is high. Basilar artery is within normal limits. Both posterior cerebral arteries originate from the basilar tip. There is mild distal small vessel disease without a significant proximal stenosis or occlusion. Venous sinuses: The dural sinuses are patent. The right transverse sinus is dominant. The straight sinus and deep cerebral veins are intact. Cortical veins are unremarkable. Anatomic variants: None Delayed phase: Postcontrast images demonstrate early loss of gray-white differentiation in the anterior right frontal lobe. Review of the MIP images confirms the above findings CT Brain Perfusion Findings: CBF (<30%) Volume: 4mL Perfusion (Tmax>6.0s) volume: 166mL Mismatch Volume: 76mL Infarction Location:Right frontal lobe IMPRESSION:  1. Acute infarct of the right frontal lobe. Core infarct volume is 91 mL. 2. Occluded right M2 branch at the MCA bifurcation without distal reconstitution or collateral vessels. This corresponds to the infarct territory 3. Mild narrowing of the left M1 segment as described. 4. Diffuse distal small vessel disease without other significant proximal stenosis  or occlusion in the circle-of-Willis. 5. High-grade stenosis of the left internal carotid artery at the bifurcation. 6. Atherosclerotic changes involving the right carotid bifurcation without significant stenosis. 7. Extensive atherosclerotic disease at the aortic arch without significant stenosis. 8. 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 It is reconstituted at C1-2 level. 9. Multilevel degenerative changes in the cervical spine These results were called by telephone at the time of interpretation on 06/21/2018 at 12:52 pm to Dr. Alexis Goodell , who verbally acknowledged these results. Electronically Signed   By: San Morelle M.D.   On: 06/21/2018 13:01   Ct Cerebral Perfusion W Contrast  Result Date: 06/21/2018 CLINICAL DATA:  Stroke, follow-up. Acute onset of left-sided weakness and facial droop with ice fixed to the right. EXAM: CT ANGIOGRAPHY HEAD AND NECK CT PERFUSION BRAIN TECHNIQUE: Multidetector CT imaging of the head and neck was performed using the standard protocol during bolus administration of intravenous contrast. Multiplanar CT image reconstructions and MIPs were obtained to evaluate the vascular anatomy. Carotid stenosis measurements (when applicable) are obtained utilizing NASCET criteria, using the distal internal carotid diameter as the denominator. Multiphase CT imaging of the brain was performed following IV bolus contrast injection. Subsequent parametric perfusion maps were calculated using RAPID software. CONTRAST:  174mL ISOVUE-370 IOPAMIDOL (ISOVUE-370) INJECTION 76% COMPARISON:  CT head without contrast from the same day. MRI brain and MRA circle-of-Willis 01/23/2018. FINDINGS: CTA NECK FINDINGS Aortic arch: A 3 vessel arch configuration is present. Extensive atherosclerotic calcifications are present without a significant stenosis of the great vessel origins. There is no aneurysm. Right carotid system: Atherosclerotic calcifications are present along the wall of the right common  carotid artery without a significant stenosis. There is calcified and noncalcified plaque through the right carotid bifurcation. There is no focal luminal stenosis relative to the more distal vessel. The cervical right ICA is within normal limits. Left carotid system: The left common carotid artery demonstrates mural calcifications without a significant luminal stenosis. Dense calcifications are present at the left carotid bifurcation. The lumen is narrowed to less than 1 mm. Additional distal calcifications are present in the proximal left ICA without a significant tandem stenosis. Vertebral arteries: The right vertebral artery originates from the subclavian artery without significant stenosis. There is some tortuosity and calcification of the cervical right ICA without a significant stenosis. The left vertebral artery is occluded below the C1-2 level. Skeleton: Multilevel degenerative changes are noted in the cervical spine. Degenerative anterolisthesis is present at C3-4 and to a greater extent at C4-5. There is chronic loss of disc space at C5-6 and C6-7. No focal lytic or blastic lesions are present. Alignment is otherwise anatomic. Other neck: The soft tissues the neck are otherwise unremarkable. There is no significant adenopathy. Thyroid is within normal limits. Salivary glands are unremarkable. Upper chest: Mild dependent atelectasis is present. The upper lung fields are otherwise clear without focal nodule, mass, or airspace disease. [name]-defined density in the left upper lobe is stable, likely remote scarring. Review of the MIP images confirms the above findings CTA HEAD FINDINGS Anterior circulation: Atherosclerotic calcifications are present through the cavernous internal carotid arteries bilaterally. There is no significant luminal stenosis relative  to the ICA termini. The right A1 segment is dominant. There is mild narrowing of the proximal left M1 segment. The middle right M2 branch is occluded. There  is distal left MCA atherosclerotic disease without focal stenosis or occlusion. Significant attenuation of branch vessels in the distal right MCA territory. No significant collaterals are present. Posterior circulation: The left vertebral artery is reconstituted at C1. Dense calcification is present at the dural margin of the vertebral artery on the left. The distal vertebral artery is opacified. The right vertebral artery is within normal limits. PICA origin is high. Basilar artery is within normal limits. Both posterior cerebral arteries originate from the basilar tip. There is mild distal small vessel disease without a significant proximal stenosis or occlusion. Venous sinuses: The dural sinuses are patent. The right transverse sinus is dominant. The straight sinus and deep cerebral veins are intact. Cortical veins are unremarkable. Anatomic variants: None Delayed phase: Postcontrast images demonstrate early loss of gray-white differentiation in the anterior right frontal lobe. Review of the MIP images confirms the above findings CT Brain Perfusion Findings: CBF (<30%) Volume: 30mL Perfusion (Tmax>6.0s) volume: 12mL Mismatch Volume: 45mL Infarction Location:Right frontal lobe IMPRESSION: 1. Acute infarct of the right frontal lobe. Core infarct volume is 91 mL. 2. Occluded right M2 branch at the MCA bifurcation without distal reconstitution or collateral vessels. This corresponds to the infarct territory 3. Mild narrowing of the left M1 segment as described. 4. Diffuse distal small vessel disease without other significant proximal stenosis or occlusion in the circle-of-Willis. 5. High-grade stenosis of the left internal carotid artery at the bifurcation. 6. Atherosclerotic changes involving the right carotid bifurcation without significant stenosis. 7. Extensive atherosclerotic disease at the aortic arch without significant stenosis. 8. 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 It is reconstituted at C1-2 level. 9.  Multilevel degenerative changes in the cervical spine These results were called by telephone at the time of interpretation on 06/21/2018 at 12:52 pm to Dr. Alexis Goodell , who verbally acknowledged these results. Electronically Signed   By: San Morelle M.D.   On: 06/21/2018 13:01   Dg Chest Port 1 View  Result Date: 06/27/2018 CLINICAL DATA:  Leukocytosis. EXAM: PORTABLE CHEST 1 VIEW COMPARISON:  03/07/2018. FINDINGS: Normal sized heart. Tortuous aorta. Small right pleural effusion. Stable mildly prominent interstitial markings. Thoracolumbar spine degenerative changes. Bilateral shoulder degenerative changes with superior migration of the right humeral head and bony remodeling compatible with a chronic large right rotator cuff tear. Feeding tube extending into the stomach. IMPRESSION: 1. Small right pleural effusion. 2. Stable chronic interstitial lung disease. Electronically Signed   By: Claudie Revering M.D.   On: 06/27/2018 10:49   Ct Head Code Stroke Wo Contrast  Result Date: 06/21/2018 CLINICAL DATA:  Code stroke. Focal neuro deficit, less than 6 hours, stroke suspected. Patient found unresponsive 1 hour ago. Acute onset of left-sided weakness and facial droop with ice fixed to the right. EXAM: CT HEAD WITHOUT CONTRAST TECHNIQUE: Contiguous axial images were obtained from the base of the skull through the vertex without intravenous contrast. COMPARISON:  CT head without contrast 02/22/2018 FINDINGS: Brain: Atrophy and moderate diffuse white matter changes are again seen bilaterally. No acute cortical infarct, hemorrhage, or mass lesion is present. The basal ganglia are intact. Insular ribbon is normal bilaterally. No acute or focal right-sided infarct is present. The ventricles are proportionate to the degree of atrophy and stable. The brainstem and cerebellum are normal. Vascular: Atherosclerotic calcifications are present in the cavernous and precavernous  internal carotid arteries. There is no  hyperdense vessel. Skull: Calvarium is intact. No focal lytic or blastic lesions are present. Sinuses/Orbits: Paranasal sinuses and mastoid air cells are clear. Bilateral lens replacements are present. Globes and orbits are otherwise within normal limits. ASPECTS [facility] Stroke Program Early CT Score) - Ganglionic level infarction (caudate, lentiform nuclei, internal capsule, insula, M1-M3 cortex): 7/7 - Supraganglionic infarction (M4-M6 cortex): 3/3 Total score (0-10 with 10 being normal): 10/10 IMPRESSION: 1. No acute intracranial abnormality. 2. ASPECTS is 10/10 3. Moderate atrophy and white matter disease is stable bilaterally. These results were called by telephone at the time of interpretation on 06/21/2018 at 11:03 am to Dr. Harvest Dark , who verbally acknowledged these results. Electronically Signed   By: San Morelle M.D.   On: 06/21/2018 11:05   Transthoracic Echocardiogram  06/22/2018 Study Conclusions - Left ventricle: The cavity size was normal. Systolic function was vigorous. The estimated ejection fraction was in the range of 65% to 70%. Wall motion was normal; there were no regional wall motion abnormalities. Features are consistent with a pseudonormal left ventricular filling pattern, with concomitant abnormal relaxation and increased filling pressure (grade 2 diastolic dysfunction). Doppler parameters are consistent with high ventricular filling pressure. - Aortic valve: Poorly visualized. Severely calcified annulus. Trileaflet; moderately thickened, moderately calcified leaflets. - Mitral valve: Severely calcified annulus. Moderately thickened, mildly calcified leaflets anterior. There was mild to moderate regurgitation. - Atrial septum: There was increased thickness of the septum, consistent with lipomatous hypertrophy. Tricuspid valve: There was mild regurgitation. - Pulmonic valve: There was mild regurgitation.  PEG placement 06/28/2018 Dawn [name])   PHYSICAL  EXAM  General - cachectic, well developed, in no apparent distress, mouth breathing (dry tongue, green residue in mouth), sleeping with eyes opened (light yellow mucous in corners).  Cardiovascular - Regular rate and rhythm, not in A. fib.  Neuro - lethargic post PEG, will awaken to voice/touch. Answers simple Y/N questions appropriately. Able to follow simple commands. Severe dysarthria. able to make sound out today but not sentences.  Right gaze preference, however able to cross midline, PERRL, not blinking to visual threat on the left.  Left facial droop mild, tongue midline.  Left upper extremity 1/5 with pain summation, left lower extremity 0/5 proximal and 2/5 distally.  Right upper and lower extremity 3/5. Diffuse ecchymoses BUEs, L>R. DTR 1+, no Babinski. Sensation intact. Coordination not cooperative and gait not tested.   ASSESSMENT/PLAN Dawn [name] is a 84 y.o. female with history of LLE PVD with stenting, hip surgery, TIA, cancer, and HTN presenting with left sided weakness. The patient received IV TPA at [facility] Friday AM 06/21/2018.  Stroke: Large right MCA infarct s/p IV tPA - embolic due to A. fib/aflutter not on AC.  Resultant  Left side hemiplegia, right gaze preference  CT head - No acute intracranial abnormality.   CTA H&N - Occluded right M2 branch. High-grade stenosis of the left internal carotid furcation  CT perfusion - large infarct core   MRI head - not performed due to "metal in body" as per family  2D Echo - EF 65% to 70%. No cardiac source of emboli identified.  LDL 42  HgbA1c - 5.7  VTE prophylaxis - subq heparin  aspirin 81 mg daily and clopidogrel 75 mg daily prior to admission, now aspirin 325.  Will start anticoagulation at 7-10 days post stroke (around 06/29/18) given large size of stroke  Ongoing aggressive stroke risk factor management  Therapy recommendations:  SNF   Disposition:  Pending. Anticipate medically ready  once stable post PEG placement with plans to d/c once SNFbed found.  Lethargy   Post PEG placement  Will check CT head  A. fib/a flutter not on AC  As per family, patient has diagnosed with A. fib in the past not on [facility], but on aspirin and Plavix  AF confirmed in ICU  On aspirin 325  On Metoprolol  Rate controlled  We will start anticoagulation at 7-10 days after stroke (around 06/29/18) given large size of the stroke  Hyperlipidemia  LDL 42, goal LDL < 70   Lipitor 20 PTA, resumed in hospital  Continue statin at d/c  Hypertension  Stable on the high end  Treated post tPA w/ cleviprex . SBP goal < 160  . Resumed home BP medications including Norvasc and clonidine.   . Added metoprolol, increase to 25mg  bid . Long-term BP goal normotensive  Dysphagia  Did not pass swallow  PEG tube placed Friday   Trauma team consulted  Speech on board  NT suctioning   Mucomyst neb  Mouth care  Other Stroke Risk Factors  Advanced age  Former cigarette smoker - quit  Other Active Problems  Elevated creatinine, resolved 0.95  Hypokalemia, resolved - supplement 4.4  Leukocytosis - 8.6->12.2->13.5. Etiology unclear. CXR w/ sm R pleural effusion. UA pending   Dietitian consult 06/23/2018 - Severe malnutrition in context of chronic illness.  Palliative care on board. Plans for full treatment  No BM documented since admission, will give miralax   Eyes - some yellow drainage, sleeps with eyes opened - will give Lacrilube at hs and artifical tears during the day (uses zaditar OTC eye drops prn at home)   Hospital day # 8    ATTENDING NOTE: I reviewed above note and agree with the assessment and plan. I have made any additions or clarifications directly to the above note. Pt was seen and examined.   Personally examined patient and images, and have participated in and made any corrections needed to history, physical, neuro exam,assessment and plan as stated above.   I have personally obtained the history, evaluated lab date, reviewed imaging studies and agree with radiology interpretations.    Dawn Ill, MD Stroke Neurology     To contact Stroke Continuity provider, please refer to [email or web address] After hours, contact General Neurology

## 2018-06-29 NOTE — Progress Notes (Signed)
Called by nurse today regarding patient's increased lethargy. BP has been low at times (SBP 80) but other times normal. Pt arouses easily and improves with stimulation. Family reported change over the last day or two; although, the patient had PEG placed yesterday which required sedation (MAC). She may still be recovering from the procedure. Will give fluid bolus 500 cc NS (BUN 27 this AM). Repeat CBC and Bmet. Asked nurse to call me if any change.  Mikey Bussing PA-C [facility] Pager [phone] 06/29/2018, 4:07 PM

## 2018-06-30 ENCOUNTER — Inpatient Hospital Stay (HOSPITAL_COMMUNITY): Payer: Medicare Other

## 2018-06-30 ENCOUNTER — Encounter (HOSPITAL_COMMUNITY): Payer: Self-pay | Admitting: General Surgery

## 2018-06-30 LAB — GLUCOSE, CAPILLARY
GLUCOSE-CAPILLARY: 126 mg/dL — AB (ref 70–99)
GLUCOSE-CAPILLARY: 167 mg/dL — AB (ref 70–99)
GLUCOSE-CAPILLARY: 171 mg/dL — AB (ref 70–99)
Glucose-Capillary: 143 mg/dL — ABNORMAL HIGH (ref 70–99)
Glucose-Capillary: 147 mg/dL — ABNORMAL HIGH (ref 70–99)
Glucose-Capillary: 122 mg/dL — ABNORMAL HIGH (ref 70–99)

## 2018-06-30 LAB — URINALYSIS, COMPLETE (UACMP) WITH MICROSCOPIC
BACTERIA UA: NONE SEEN
BILIRUBIN URINE: NEGATIVE
Glucose, UA: 50 mg/dL — AB
KETONES UR: NEGATIVE mg/dL
LEUKOCYTES UA: NEGATIVE
NITRITE: NEGATIVE
Protein, ur: 30 mg/dL — AB
Specific Gravity, Urine: 1.011 (ref 1.005–1.030)
pH: 7 (ref 5.0–8.0)

## 2018-06-30 LAB — BASIC METABOLIC PANEL
Anion gap: 9 (ref 5–15)
BUN: 30 mg/dL — AB (ref 8–23)
CHLORIDE: 103 mmol/L (ref 98–111)
CO2: 25 mmol/L (ref 22–32)
Calcium: 8.4 mg/dL — ABNORMAL LOW (ref 8.9–10.3)
Creatinine, Ser: 0.89 mg/dL (ref 0.44–1.00)
GFR calc Af Amer: >60 mL/min (ref >60)
GFR calc non Af Amer: 57 mL/min — ABNORMAL LOW (ref >60)
GLUCOSE: 141 mg/dL — AB (ref 70–99)
POTASSIUM: 3.9 mmol/L (ref 3.5–5.1)
Sodium: 137 mmol/L (ref 135–145)

## 2018-06-30 LAB — CBC
HCT: 32.1 % — ABNORMAL LOW (ref 36.0–46.0)
HEMOGLOBIN: 10.3 g/dL — AB (ref 12.0–15.0)
MCH: 28.5 pg (ref 26.0–34.0)
MCHC: 32.1 g/dL (ref 30.0–36.0)
MCV: 88.9 fL (ref 78.0–100.0)
Platelets: 325 10*3/uL (ref 150–400)
RBC: 3.61 MIL/uL — AB (ref 3.87–5.11)
RDW: 15.7 % — ABNORMAL HIGH (ref 11.5–15.5)
WBC: 16.3 10*3/uL — ABNORMAL HIGH (ref 4.0–10.5)

## 2018-06-30 MED ORDER — WHITE PETROLATUM EX OINT
TOPICAL_OINTMENT | CUTANEOUS | Status: AC
  Administered 2018-06-30: 15:00:00
  Filled 2018-06-30: qty 28.35

## 2018-06-30 MED ORDER — SODIUM CHLORIDE 0.9 % IV SOLN
1.0000 g | INTRAVENOUS | Status: DC
  Administered 2018-06-30 – 2018-07-01 (×2): 1 g via INTRAVENOUS
  Filled 2018-06-30 (×2): qty 10

## 2018-06-30 MED ORDER — CLONIDINE HCL 0.1 MG PO TABS
0.2000 mg | ORAL_TABLET | Freq: Two times a day (BID) | ORAL | Status: DC
  Administered 2018-07-01 – 2018-07-04 (×7): 0.2 mg
  Filled 2018-06-30 (×8): qty 2

## 2018-06-30 NOTE — Progress Notes (Signed)
STROKE TEAM PROGRESS NOTE   SUBJECTIVE (INTERVAL HISTORY) Patient son is at bedside.Patient febrile, UA, CXR and blood cultures pending. Blood pressure has been elevated, wbcs elevated  OBJECTIVE Temp:  [97.3 F (36.3 C)-99.1 F (37.3 C)] 99.1 F (37.3 C) (08/04 0826) Pulse Rate:  [70-94] 93 (08/04 0826) Cardiac Rhythm: Normal sinus rhythm (08/04 0700) Resp:  [16-18] 17 (08/04 0826) BP: (135-216)/(63-86) 190/63 (08/04 0826) SpO2:  [94 %-97 %] 95 % (08/04 0826) Weight:  [115 lb 15.4 oz (52.6 kg)] 115 lb 15.4 oz (52.6 kg) (08/04 0320)   Vitals:   06/29/18 2313 06/30/18 0320 06/30/18 0820 06/30/18 0826  BP: (!) 158/67 (!) 161/86 (!) 216/81 (!) 190/63  Pulse: 78 94  93  Resp: 18 16  17   Temp: (!) 97.3 F (36.3 C) 97.6 F (36.4 C)  99.1 F (37.3 C)  TempSrc: Oral Oral  Axillary  SpO2: 95% 95%  95%  Weight:  115 lb 15.4 oz (52.6 kg)    Height:         CBC:  Recent Labs  Lab 06/29/18 1751 06/30/18 0612  WBC 11.2* 16.3*  HGB 9.5* 10.3*  HCT 30.6* 32.1*  MCV 90.3 88.9  PLT 279 989    Basic Metabolic Panel:  Recent Labs  Lab 06/24/18 1703 06/25/18 0412  06/29/18 1633 06/30/18 0612  NA  --  142   < > 135 137  K  --  2.9*   < > 4.6 3.9  CL  --  109   < > 103 103  CO2  --  22   < > 24 25  GLUCOSE  --  127*   < > 135* 141*  BUN  --  40*   < > 31* 30*  CREATININE  --  1.10*   < > 0.97 0.89  CALCIUM  --  8.7*   < > 8.2* 8.4*  MG 2.2 2.2  --   --   --   PHOS 4.3 3.6  --   --   --    < > = values in this interval not displayed.    Lipid Panel:     Component Value Date/Time   CHOL 103 06/22/2018 0151   TRIG 67 06/22/2018 0151   HDL 48 06/22/2018 0151   CHOLHDL 2.1 06/22/2018 0151   VLDL 13 06/22/2018 0151   LDLCALC 42 06/22/2018 0151   HgbA1c:  Lab Results  Component Value Date   HGBA1C 5.7 (H) 06/22/2018   Urine Drug Screen:     Component Value Date/Time   LABOPIA NONE DETECTED 01/22/2018 1700   COCAINSCRNUR NONE DETECTED 01/22/2018 1700   LABBENZ  NONE DETECTED 01/22/2018 1700   AMPHETMU NONE DETECTED 01/22/2018 1700   THCU NONE DETECTED 01/22/2018 1700   LABBARB NONE DETECTED 01/22/2018 1700    Alcohol Level     Component Value Date/Time   ETH <10 01/22/2018 2013    IMAGING  Ct Angio Head W Or Wo Contrast  Result Date: 06/21/2018 CLINICAL DATA:  Stroke, follow-up. Acute onset of left-sided weakness and facial droop with ice fixed to the right. EXAM: CT ANGIOGRAPHY HEAD AND NECK CT PERFUSION BRAIN TECHNIQUE: Multidetector CT imaging of the head and neck was performed using the standard protocol during bolus administration of intravenous contrast. Multiplanar CT image reconstructions and MIPs were obtained to evaluate the vascular anatomy. Carotid stenosis measurements (when applicable) are obtained utilizing NASCET criteria, using the distal internal carotid diameter as the denominator. Multiphase CT imaging of  the brain was performed following IV bolus contrast injection. Subsequent parametric perfusion maps were calculated using RAPID software. CONTRAST:  121mL ISOVUE-370 IOPAMIDOL (ISOVUE-370) INJECTION 76% COMPARISON:  CT head without contrast from the same day. MRI brain and MRA circle-of-Willis 01/23/2018. FINDINGS: CTA NECK FINDINGS Aortic arch: A 3 vessel arch configuration is present. Extensive atherosclerotic calcifications are present without a significant stenosis of the great vessel origins. There is no aneurysm. Right carotid system: Atherosclerotic calcifications are present along the wall of the right common carotid artery without a significant stenosis. There is calcified and noncalcified plaque through the right carotid bifurcation. There is no focal luminal stenosis relative to the more distal vessel. The cervical right ICA is within normal limits. Left carotid system: The left common carotid artery demonstrates mural calcifications without a significant luminal stenosis. Dense calcifications are present at the left carotid  bifurcation. The lumen is narrowed to less than 1 mm. Additional distal calcifications are present in the proximal left ICA without a significant tandem stenosis. Vertebral arteries: The right vertebral artery originates from the subclavian artery without significant stenosis. There is some tortuosity and calcification of the cervical right ICA without a significant stenosis. The left vertebral artery is occluded below the C1-2 level. Skeleton: Multilevel degenerative changes are noted in the cervical spine. Degenerative anterolisthesis is present at C3-4 and to a greater extent at C4-5. There is chronic loss of disc space at C5-6 and C6-7. No focal lytic or blastic lesions are present. Alignment is otherwise anatomic. Other neck: The soft tissues the neck are otherwise unremarkable. There is no significant adenopathy. Thyroid is within normal limits. Salivary glands are unremarkable. Upper chest: Mild dependent atelectasis is present. The upper lung fields are otherwise clear without focal nodule, mass, or airspace disease. Ill-defined density in the left upper lobe is stable, likely remote scarring. Review of the MIP images confirms the above findings CTA HEAD FINDINGS Anterior circulation: Atherosclerotic calcifications are present through the cavernous internal carotid arteries bilaterally. There is no significant luminal stenosis relative to the ICA termini. The right A1 segment is dominant. There is mild narrowing of the proximal left M1 segment. The middle right M2 branch is occluded. There is distal left MCA atherosclerotic disease without focal stenosis or occlusion. Significant attenuation of branch vessels in the distal right MCA territory. No significant collaterals are present. Posterior circulation: The left vertebral artery is reconstituted at C1. Dense calcification is present at the dural margin of the vertebral artery on the left. The distal vertebral artery is opacified. The right vertebral artery  is within normal limits. PICA origin is high. Basilar artery is within normal limits. Both posterior cerebral arteries originate from the basilar tip. There is mild distal small vessel disease without a significant proximal stenosis or occlusion. Venous sinuses: The dural sinuses are patent. The right transverse sinus is dominant. The straight sinus and deep cerebral veins are intact. Cortical veins are unremarkable. Anatomic variants: None Delayed phase: Postcontrast images demonstrate early loss of gray-white differentiation in the anterior right frontal lobe. Review of the MIP images confirms the above findings CT Brain Perfusion Findings: CBF (<30%) Volume: 10mL Perfusion (Tmax>6.0s) volume: 160mL Mismatch Volume: 93mL Infarction Location:Right frontal lobe IMPRESSION: 1. Acute infarct of the right frontal lobe. Core infarct volume is 91 mL. 2. Occluded right M2 branch at the MCA bifurcation without distal reconstitution or collateral vessels. This corresponds to the infarct territory 3. Mild narrowing of the left M1 segment as described. 4. Diffuse distal small vessel  disease without other significant proximal stenosis or occlusion in the circle-of-Willis. 5. High-grade stenosis of the left internal carotid artery at the bifurcation. 6. Atherosclerotic changes involving the right carotid bifurcation without significant stenosis. 7. Extensive atherosclerotic disease at the aortic arch without significant stenosis. 8. 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 It is reconstituted at C1-2 level. 9. Multilevel degenerative changes in the cervical spine These results were called by telephone at the time of interpretation on 06/21/2018 at 12:52 pm to Dr. Alexis Goodell , who verbally acknowledged these results. Electronically Signed   By: San Morelle M.D.   On: 06/21/2018 13:01   Ct Head Wo Contrast  Result Date: 06/28/2018 CLINICAL DATA:  Stroke follow-up EXAM: CT HEAD WITHOUT CONTRAST TECHNIQUE: Contiguous  axial images were obtained from the base of the skull through the vertex without intravenous contrast. COMPARISON:  06/22/2018 FINDINGS: Brain: Continued evolution of right MCA territory large infarct. There is a suspected small focus of petechial hemorrhage measuring approximately 4 x 2 mm 3 mm of leftward midline shift, slightly greater than on the prior study. Size and configuration of the ventricles are unchanged. Old left basal ganglia small vessel infarct is unchanged. There is hypoattenuation of the periventricular white matter, most commonly indicating chronic ischemic microangiopathy. Vascular: Atherosclerotic calcification of the internal carotid arteries at the skull base. No abnormal hyperdensity of the major intracranial arteries or dural venous sinuses. Skull: The visualized skull base, calvarium and extracranial soft tissues are normal. Sinuses/Orbits: No fluid levels or advanced mucosal thickening of the visualized paranasal sinuses. No mastoid or middle ear effusion. The orbits are normal. IMPRESSION: 1. Continued evolution of right MCA infarct with progression of cytotoxic edema and minimally increased leftward midline shift. 2. Suspected 4 x 2 mm focus of petechial hemorrhage within the infarcted area. No hemorrhagic conversion. These results were called by telephone at the time of interpretation on 06/28/2018 at 8:18 pm to Dr. Rory Percy , who verbally acknowledged these results. Electronically Signed   By: Ulyses Jarred M.D.   On: 06/28/2018 20:18   Ct Head Wo Contrast  Result Date: 06/22/2018 CLINICAL DATA:  Focal neuro deficit, less than 6 hours. TPA 24 hours ago. EXAM: CT HEAD WITHOUT CONTRAST TECHNIQUE: Contiguous axial images were obtained from the base of the skull through the vertex without intravenous contrast. COMPARISON:  CT head without contrast 06/21/2018. CTA head and neck and CT perfusion head 06/21/2018. FINDINGS: Brain: A large right MCA territory infarct corresponds to the area of  core infarct on the CT perfusion exam. There is involvement of the right frontal operculum and insular cortex. Basal ganglia are seen. Right ACA territory is spared. There is local mass effect with effacement of the adjacent sulci. There is no midline shift. There is partial effacement of the right lateral ventricle. Left lateral ventricle is within normal limits. Brainstem and cerebellum are normal. There is no hemorrhage into the infarct. Vascular: Atherosclerotic calcifications are present within the cavernous internal carotid arteries bilaterally. There is no hyperdense vessel. Skull: Calvarium is intact. No focal lytic or blastic lesions are present. Sinuses/Orbits: The paranasal sinuses and mastoid air cells are clear. Bilateral lens replacements are present. Globes and orbits are otherwise within normal limits. IMPRESSION: 1. Involving nonhemorrhagic right MCA territory infarct corresponds with the area predicted by CT perfusion. 2. No hemorrhage within the infarct. 3. Local mass effect without significant midline shift. 4. Generalized atrophy and white matter disease is otherwise stable. Electronically Signed   By: Wynetta Fines.D.  On: 06/22/2018 12:09   Ct Angio Neck W Or Wo Contrast  Result Date: 06/21/2018 CLINICAL DATA:  Stroke, follow-up. Acute onset of left-sided weakness and facial droop with ice fixed to the right. EXAM: CT ANGIOGRAPHY HEAD AND NECK CT PERFUSION BRAIN TECHNIQUE: Multidetector CT imaging of the head and neck was performed using the standard protocol during bolus administration of intravenous contrast. Multiplanar CT image reconstructions and MIPs were obtained to evaluate the vascular anatomy. Carotid stenosis measurements (when applicable) are obtained utilizing NASCET criteria, using the distal internal carotid diameter as the denominator. Multiphase CT imaging of the brain was performed following IV bolus contrast injection. Subsequent parametric perfusion maps were  calculated using RAPID software. CONTRAST:  128mL ISOVUE-370 IOPAMIDOL (ISOVUE-370) INJECTION 76% COMPARISON:  CT head without contrast from the same day. MRI brain and MRA circle-of-Willis 01/23/2018. FINDINGS: CTA NECK FINDINGS Aortic arch: A 3 vessel arch configuration is present. Extensive atherosclerotic calcifications are present without a significant stenosis of the great vessel origins. There is no aneurysm. Right carotid system: Atherosclerotic calcifications are present along the wall of the right common carotid artery without a significant stenosis. There is calcified and noncalcified plaque through the right carotid bifurcation. There is no focal luminal stenosis relative to the more distal vessel. The cervical right ICA is within normal limits. Left carotid system: The left common carotid artery demonstrates mural calcifications without a significant luminal stenosis. Dense calcifications are present at the left carotid bifurcation. The lumen is narrowed to less than 1 mm. Additional distal calcifications are present in the proximal left ICA without a significant tandem stenosis. Vertebral arteries: The right vertebral artery originates from the subclavian artery without significant stenosis. There is some tortuosity and calcification of the cervical right ICA without a significant stenosis. The left vertebral artery is occluded below the C1-2 level. Skeleton: Multilevel degenerative changes are noted in the cervical spine. Degenerative anterolisthesis is present at C3-4 and to a greater extent at C4-5. There is chronic loss of disc space at C5-6 and C6-7. No focal lytic or blastic lesions are present. Alignment is otherwise anatomic. Other neck: The soft tissues the neck are otherwise unremarkable. There is no significant adenopathy. Thyroid is within normal limits. Salivary glands are unremarkable. Upper chest: Mild dependent atelectasis is present. The upper lung fields are otherwise clear without  focal nodule, mass, or airspace disease. Ill-defined density in the left upper lobe is stable, likely remote scarring. Review of the MIP images confirms the above findings CTA HEAD FINDINGS Anterior circulation: Atherosclerotic calcifications are present through the cavernous internal carotid arteries bilaterally. There is no significant luminal stenosis relative to the ICA termini. The right A1 segment is dominant. There is mild narrowing of the proximal left M1 segment. The middle right M2 branch is occluded. There is distal left MCA atherosclerotic disease without focal stenosis or occlusion. Significant attenuation of branch vessels in the distal right MCA territory. No significant collaterals are present. Posterior circulation: The left vertebral artery is reconstituted at C1. Dense calcification is present at the dural margin of the vertebral artery on the left. The distal vertebral artery is opacified. The right vertebral artery is within normal limits. PICA origin is high. Basilar artery is within normal limits. Both posterior cerebral arteries originate from the basilar tip. There is mild distal small vessel disease without a significant proximal stenosis or occlusion. Venous sinuses: The dural sinuses are patent. The right transverse sinus is dominant. The straight sinus and deep cerebral veins are intact. Cortical  veins are unremarkable. Anatomic variants: None Delayed phase: Postcontrast images demonstrate early loss of gray-white differentiation in the anterior right frontal lobe. Review of the MIP images confirms the above findings CT Brain Perfusion Findings: CBF (<30%) Volume: 61mL Perfusion (Tmax>6.0s) volume: 154mL Mismatch Volume: 47mL Infarction Location:Right frontal lobe IMPRESSION: 1. Acute infarct of the right frontal lobe. Core infarct volume is 91 mL. 2. Occluded right M2 branch at the MCA bifurcation without distal reconstitution or collateral vessels. This corresponds to the infarct  territory 3. Mild narrowing of the left M1 segment as described. 4. Diffuse distal small vessel disease without other significant proximal stenosis or occlusion in the circle-of-Willis. 5. High-grade stenosis of the left internal carotid artery at the bifurcation. 6. Atherosclerotic changes involving the right carotid bifurcation without significant stenosis. 7. Extensive atherosclerotic disease at the aortic arch without significant stenosis. 8. 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 It is reconstituted at C1-2 level. 9. Multilevel degenerative changes in the cervical spine These results were called by telephone at the time of interpretation on 06/21/2018 at 12:52 pm to Dr. Alexis Goodell , who verbally acknowledged these results. Electronically Signed   By: San Morelle M.D.   On: 06/21/2018 13:01   Ct Cerebral Perfusion W Contrast  Result Date: 06/21/2018 CLINICAL DATA:  Stroke, follow-up. Acute onset of left-sided weakness and facial droop with ice fixed to the right. EXAM: CT ANGIOGRAPHY HEAD AND NECK CT PERFUSION BRAIN TECHNIQUE: Multidetector CT imaging of the head and neck was performed using the standard protocol during bolus administration of intravenous contrast. Multiplanar CT image reconstructions and MIPs were obtained to evaluate the vascular anatomy. Carotid stenosis measurements (when applicable) are obtained utilizing NASCET criteria, using the distal internal carotid diameter as the denominator. Multiphase CT imaging of the brain was performed following IV bolus contrast injection. Subsequent parametric perfusion maps were calculated using RAPID software. CONTRAST:  155mL ISOVUE-370 IOPAMIDOL (ISOVUE-370) INJECTION 76% COMPARISON:  CT head without contrast from the same day. MRI brain and MRA circle-of-Willis 01/23/2018. FINDINGS: CTA NECK FINDINGS Aortic arch: A 3 vessel arch configuration is present. Extensive atherosclerotic calcifications are present without a significant stenosis of  the great vessel origins. There is no aneurysm. Right carotid system: Atherosclerotic calcifications are present along the wall of the right common carotid artery without a significant stenosis. There is calcified and noncalcified plaque through the right carotid bifurcation. There is no focal luminal stenosis relative to the more distal vessel. The cervical right ICA is within normal limits. Left carotid system: The left common carotid artery demonstrates mural calcifications without a significant luminal stenosis. Dense calcifications are present at the left carotid bifurcation. The lumen is narrowed to less than 1 mm. Additional distal calcifications are present in the proximal left ICA without a significant tandem stenosis. Vertebral arteries: The right vertebral artery originates from the subclavian artery without significant stenosis. There is some tortuosity and calcification of the cervical right ICA without a significant stenosis. The left vertebral artery is occluded below the C1-2 level. Skeleton: Multilevel degenerative changes are noted in the cervical spine. Degenerative anterolisthesis is present at C3-4 and to a greater extent at C4-5. There is chronic loss of disc space at C5-6 and C6-7. No focal lytic or blastic lesions are present. Alignment is otherwise anatomic. Other neck: The soft tissues the neck are otherwise unremarkable. There is no significant adenopathy. Thyroid is within normal limits. Salivary glands are unremarkable. Upper chest: Mild dependent atelectasis is present. The upper lung fields are otherwise clear  without focal nodule, mass, or airspace disease. Ill-defined density in the left upper lobe is stable, likely remote scarring. Review of the MIP images confirms the above findings CTA HEAD FINDINGS Anterior circulation: Atherosclerotic calcifications are present through the cavernous internal carotid arteries bilaterally. There is no significant luminal stenosis relative to the  ICA termini. The right A1 segment is dominant. There is mild narrowing of the proximal left M1 segment. The middle right M2 branch is occluded. There is distal left MCA atherosclerotic disease without focal stenosis or occlusion. Significant attenuation of branch vessels in the distal right MCA territory. No significant collaterals are present. Posterior circulation: The left vertebral artery is reconstituted at C1. Dense calcification is present at the dural margin of the vertebral artery on the left. The distal vertebral artery is opacified. The right vertebral artery is within normal limits. PICA origin is high. Basilar artery is within normal limits. Both posterior cerebral arteries originate from the basilar tip. There is mild distal small vessel disease without a significant proximal stenosis or occlusion. Venous sinuses: The dural sinuses are patent. The right transverse sinus is dominant. The straight sinus and deep cerebral veins are intact. Cortical veins are unremarkable. Anatomic variants: None Delayed phase: Postcontrast images demonstrate early loss of gray-white differentiation in the anterior right frontal lobe. Review of the MIP images confirms the above findings CT Brain Perfusion Findings: CBF (<30%) Volume: 3mL Perfusion (Tmax>6.0s) volume: 123mL Mismatch Volume: 53mL Infarction Location:Right frontal lobe IMPRESSION: 1. Acute infarct of the right frontal lobe. Core infarct volume is 91 mL. 2. Occluded right M2 branch at the MCA bifurcation without distal reconstitution or collateral vessels. This corresponds to the infarct territory 3. Mild narrowing of the left M1 segment as described. 4. Diffuse distal small vessel disease without other significant proximal stenosis or occlusion in the circle-of-Willis. 5. High-grade stenosis of the left internal carotid artery at the bifurcation. 6. Atherosclerotic changes involving the right carotid bifurcation without significant stenosis. 7. Extensive  atherosclerotic disease at the aortic arch without significant stenosis. 8. 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 It is reconstituted at C1-2 level. 9. Multilevel degenerative changes in the cervical spine These results were called by telephone at the time of interpretation on 06/21/2018 at 12:52 pm to Dr. Alexis Goodell , who verbally acknowledged these results. Electronically Signed   By: San Morelle M.D.   On: 06/21/2018 13:01   Dg Chest Port 1 View  Result Date: 06/27/2018 CLINICAL DATA:  Leukocytosis. EXAM: PORTABLE CHEST 1 VIEW COMPARISON:  03/07/2018. FINDINGS: Normal sized heart. Tortuous aorta. Small right pleural effusion. Stable mildly prominent interstitial markings. Thoracolumbar spine degenerative changes. Bilateral shoulder degenerative changes with superior migration of the right humeral head and bony remodeling compatible with a chronic large right rotator cuff tear. Feeding tube extending into the stomach. IMPRESSION: 1. Small right pleural effusion. 2. Stable chronic interstitial lung disease. Electronically Signed   By: Claudie Revering M.D.   On: 06/27/2018 10:49   Ct Head Code Stroke Wo Contrast  Result Date: 06/21/2018 CLINICAL DATA:  Code stroke. Focal neuro deficit, less than 6 hours, stroke suspected. Patient found unresponsive 1 hour ago. Acute onset of left-sided weakness and facial droop with ice fixed to the right. EXAM: CT HEAD WITHOUT CONTRAST TECHNIQUE: Contiguous axial images were obtained from the base of the skull through the vertex without intravenous contrast. COMPARISON:  CT head without contrast 02/22/2018 FINDINGS: Brain: Atrophy and moderate diffuse white matter changes are again seen bilaterally. No acute cortical infarct,  hemorrhage, or mass lesion is present. The basal ganglia are intact. Insular ribbon is normal bilaterally. No acute or focal right-sided infarct is present. The ventricles are proportionate to the degree of atrophy and stable. The brainstem and  cerebellum are normal. Vascular: Atherosclerotic calcifications are present in the cavernous and precavernous internal carotid arteries. There is no hyperdense vessel. Skull: Calvarium is intact. No focal lytic or blastic lesions are present. Sinuses/Orbits: Paranasal sinuses and mastoid air cells are clear. Bilateral lens replacements are present. Globes and orbits are otherwise within normal limits. ASPECTS [facility] Stroke Program Early CT Score) - Ganglionic level infarction (caudate, lentiform nuclei, internal capsule, insula, M1-M3 cortex): 7/7 - Supraganglionic infarction (M4-M6 cortex): 3/3 Total score (0-10 with 10 being normal): 10/10 IMPRESSION: 1. No acute intracranial abnormality. 2. ASPECTS is 10/10 3. Moderate atrophy and white matter disease is stable bilaterally. These results were called by telephone at the time of interpretation on 06/21/2018 at 11:03 am to Dr. Harvest Dark , who verbally acknowledged these results. Electronically Signed   By: San Morelle M.D.   On: 06/21/2018 11:05   Transthoracic Echocardiogram  06/22/2018 Study Conclusions - Left ventricle: The cavity size was normal. Systolic function was vigorous. The estimated ejection fraction was in the range of 65% to 70%. Wall motion was normal; there were no regional wall motion abnormalities. Features are consistent with a pseudonormal left ventricular filling pattern, with concomitant abnormal relaxation and increased filling pressure (grade 2 diastolic dysfunction). Doppler parameters are consistent with high ventricular filling pressure. - Aortic valve: Poorly visualized. Severely calcified annulus. Trileaflet; moderately thickened, moderately calcified leaflets. - Mitral valve: Severely calcified annulus. Moderately thickened, mildly calcified leaflets anterior. There was mild to moderate regurgitation. - Atrial septum: There was increased thickness of the septum, consistent with lipomatous hypertrophy.  Tricuspid valve: There was mild regurgitation. - Pulmonic valve: There was mild regurgitation.  PEG placement 06/28/2018 Hulen Skains)   PHYSICAL EXAM  General - cachectic, well developed, in no apparent distress, mouth breathing (dry tongue, green residue in mouth), sleeping with eyes opened (light yellow mucous in corners).  Cardiovascular - Regular rate and rhythm, not in A. fib.  Neuro - Answers simple Y/N questions appropriately. Able to follow simple commands. Severe dysarthria. able to make sound out today but not sentences.  Right gaze preference, however able to cross midline, PERRL, not blinking to visual threat on the left.  Left facial droop mild, tongue midline.  Left upper extremity 1/5 with pain summation, left lower extremity 0/5 proximal and 2/5 distally.  Right upper and lower extremity 3/5. Diffuse ecchymoses BUEs, L>R. DTR 1+, no Babinski. Sensation intact. Coordination not cooperative and gait not tested.   ASSESSMENT/PLAN Ms. Isabellah Hanks Perrell is a 84 y.o. female with history of LLE PVD with stenting, hip surgery, TIA, cancer, and HTN presenting with left sided weakness. The patient received IV TPA at [facility] Friday AM 06/21/2018.  Stroke: Large right MCA infarct s/p IV tPA - embolic due to A. fib/aflutter not on AC.  Resultant  Left side hemiplegia, right gaze preference  CT head - No acute intracranial abnormality.   CTA H&N - Occluded right M2 branch. High-grade stenosis of the left internal carotid furcation  CT perfusion - large infarct core   MRI head - not performed due to "metal in body" as per family  2D Echo - EF 65% to 70%. No cardiac source of emboli identified.  LDL 42  HgbA1c - 5.7  VTE prophylaxis - subq  heparin  aspirin 81 mg daily and clopidogrel 75 mg daily prior to admission, now aspirin 325.  Will start anticoagulation at 7-10 days post stroke (around 06/29/18) given large size of stroke  Ongoing aggressive stroke risk factor  management  Therapy recommendations:  SNF   Disposition:  Pending. Anticipate medically ready once stable post PEG placement with plans to d/c once SNFbed found.  Lethargy   Post PEG placement, PEG slight bleeding resolved  Will check CT head: stable  A. fib/a flutter not on AC  As per family, patient has diagnosed with A. fib in the past not on [facility], but on aspirin and Plavix  AF confirmed in ICU  On aspirin 325  On Metoprolol  Rate controlled  We will start anticoagulation at 7-10 days after stroke (around 06/29/18) given large size of the stroke  Hyperlipidemia  LDL 42, goal LDL < 70   Lipitor 20 PTA, resumed in hospital  Continue statin at d/c  Hypertension  Stable on the high end, worsened today in setting of elevated temp and leukocytosis  Treated post tPA w/ cleviprex . SBP goal < 160  . Resumed home BP medications including Norvasc and clonidine.   . Added metoprolol, increase to 25mg  bid . Long-term BP goal normotensive  Dysphagia  Did not pass swallow  PEG tube placed Friday   Trauma team consulted  Speech on board  NT suctioning   Mucomyst neb  Mouth care  Other Stroke Risk Factors  Advanced age  Former cigarette smoker - quit  Other Active Problems  Elevated creatinine, resolved 0.95  Hypokalemia, resolved - supplement 4.4  Leukocytosis - 8.6->12.2->13.5->16. Etiology unclear. CXR w/ sm R pleural effusion. UA pending. Blood cx pendings.   Dietitian consult 06/23/2018 - Severe malnutrition in context of chronic illness.  Palliative care on board. Plans for full treatment  No BM documented since admission, will give miralax   Eyes - some yellow drainage, sleeps with eyes opened - will give Lacrilube at hs and artifical tears during the day (uses zaditar OTC eye drops prn at home)   Hospital day # 9    ATTENDING NOTE: I reviewed above note and agree with the assessment and plan. I have made any additions or clarifications  directly to the above note. Pt was seen and examined.   Personally examined patient and images, and have participated in and made any corrections needed to history, physical, neuro exam,assessment and plan as stated above.  I have personally obtained the history, evaluated lab date, reviewed imaging studies and agree with radiology interpretations.   Patient son is at bedside.Patient febrile, UA, CXR and blood cultures pending. Blood pressure has been elevated, wbcs elevated      To contact Stroke Continuity provider, please refer to [email or web address] After hours, contact General Neurology

## 2018-07-01 DIAGNOSIS — D72829 Elevated white blood cell count, unspecified: Secondary | ICD-10-CM

## 2018-07-01 DIAGNOSIS — I4892 Unspecified atrial flutter: Secondary | ICD-10-CM

## 2018-07-01 DIAGNOSIS — K5900 Constipation, unspecified: Secondary | ICD-10-CM

## 2018-07-01 DIAGNOSIS — I483 Typical atrial flutter: Secondary | ICD-10-CM

## 2018-07-01 LAB — BASIC METABOLIC PANEL
ANION GAP: 8 (ref 5–15)
BUN: 32 mg/dL — ABNORMAL HIGH (ref 8–23)
CHLORIDE: 105 mmol/L (ref 98–111)
CO2: 26 mmol/L (ref 22–32)
CREATININE: 0.83 mg/dL (ref 0.44–1.00)
Calcium: 7.4 mg/dL — ABNORMAL LOW (ref 8.9–10.3)
GFR calc non Af Amer: >60 mL/min (ref >60)
Glucose, Bld: 124 mg/dL — ABNORMAL HIGH (ref 70–99)
POTASSIUM: 4.4 mmol/L (ref 3.5–5.1)
SODIUM: 139 mmol/L (ref 135–145)

## 2018-07-01 LAB — CBC WITH DIFFERENTIAL/PLATELET
ABS IMMATURE GRANULOCYTES: 0.1 10*3/uL (ref 0.0–0.1)
BASOS ABS: 0.1 10*3/uL (ref 0.0–0.1)
Basophils Relative: 0 %
Eosinophils Absolute: 0.3 10*3/uL (ref 0.0–0.7)
Eosinophils Relative: 2 %
HCT: 30.2 % — ABNORMAL LOW (ref 36.0–46.0)
HEMOGLOBIN: 9.3 g/dL — AB (ref 12.0–15.0)
IMMATURE GRANULOCYTES: 1 %
Lymphocytes Relative: 12 %
Lymphs Abs: 1.6 10*3/uL (ref 0.7–4.0)
MCH: 27.8 pg (ref 26.0–34.0)
MCHC: 30.8 g/dL (ref 30.0–36.0)
MCV: 90.1 fL (ref 78.0–100.0)
Monocytes Absolute: 1 10*3/uL (ref 0.1–1.0)
Monocytes Relative: 7 %
NEUTROS ABS: 11.1 10*3/uL — AB (ref 1.7–7.7)
Neutrophils Relative %: 78 %
Platelets: 339 10*3/uL (ref 150–400)
RBC: 3.35 MIL/uL — AB (ref 3.87–5.11)
RDW: 15.9 % — ABNORMAL HIGH (ref 11.5–15.5)
WBC: 14.1 10*3/uL — AB (ref 4.0–10.5)

## 2018-07-01 LAB — GLUCOSE, CAPILLARY
GLUCOSE-CAPILLARY: 108 mg/dL — AB (ref 70–99)
GLUCOSE-CAPILLARY: 86 mg/dL (ref 70–99)
GLUCOSE-CAPILLARY: 88 mg/dL (ref 70–99)
Glucose-Capillary: 123 mg/dL — ABNORMAL HIGH (ref 70–99)
Glucose-Capillary: 125 mg/dL — ABNORMAL HIGH (ref 70–99)
Glucose-Capillary: 137 mg/dL — ABNORMAL HIGH (ref 70–99)
Glucose-Capillary: 146 mg/dL — ABNORMAL HIGH (ref 70–99)

## 2018-07-01 MED ORDER — APIXABAN 2.5 MG PO TABS
2.5000 mg | ORAL_TABLET | Freq: Two times a day (BID) | ORAL | Status: DC
  Administered 2018-07-01 – 2018-07-04 (×6): 2.5 mg via ORAL
  Filled 2018-07-01 (×6): qty 1

## 2018-07-01 MED ORDER — AMLODIPINE BESYLATE 10 MG PO TABS: 10.0000 mg | ORAL_TABLET | Freq: Every day | ORAL | Status: AC

## 2018-07-01 MED ORDER — ATORVASTATIN CALCIUM 20 MG PO TABS: 20.0000 mg | ORAL_TABLET | Freq: Every day | ORAL | Status: AC

## 2018-07-01 MED ORDER — POLYVINYL ALCOHOL 1.4 % OP SOLN: 1.0000 [drp] | OPHTHALMIC | 0 refills | Status: AC

## 2018-07-01 MED ORDER — CEFTRIAXONE SODIUM 1 G IJ SOLR
1.0000 g | INTRAMUSCULAR | Status: AC
  Administered 2018-07-02: 1 g via INTRAVENOUS
  Filled 2018-07-01: qty 10

## 2018-07-01 MED ORDER — ARTIFICIAL TEARS OPHTHALMIC OINT: TOPICAL_OINTMENT | Freq: Every day | OPHTHALMIC | Status: AC

## 2018-07-01 MED ORDER — APIXABAN 2.5 MG PO TABS: 2.5000 mg | ORAL_TABLET | Freq: Two times a day (BID) | ORAL | Status: AC

## 2018-07-01 MED ORDER — FERROUS SULFATE 300 (60 FE) MG/5ML PO SYRP: 300.0000 mg | ORAL_SOLUTION | Freq: Two times a day (BID) | ORAL | 3 refills | Status: AC

## 2018-07-01 MED ORDER — CYANOCOBALAMIN 2500 MCG PO TABS: 5000.0000 ug | ORAL_TABLET | Freq: Every day | ORAL | Status: AC

## 2018-07-01 MED ORDER — METOPROLOL TARTRATE 25 MG/10 ML ORAL SUSPENSION: 25.0000 mg | Freq: Two times a day (BID) | ORAL | Status: DC

## 2018-07-01 MED ORDER — PRO-STAT SUGAR FREE PO LIQD: 30.0000 mL | Freq: Every day | ORAL | 0 refills | Status: AC

## 2018-07-01 MED ORDER — ACETAMINOPHEN 160 MG/5ML PO SOLN: 650.0000 mg | Freq: Four times a day (QID) | ORAL | 0 refills | Status: AC | PRN

## 2018-07-01 MED ORDER — PANTOPRAZOLE SODIUM 40 MG PO PACK: 40.0000 mg | PACK | Freq: Every day | ORAL | Status: AC

## 2018-07-01 MED ORDER — OSMOLITE 1.2 CAL PO LIQD: 1000.0000 mL | ORAL | 0 refills | Status: AC

## 2018-07-01 MED ORDER — CLONIDINE HCL 0.2 MG PO TABS: 0.2000 mg | ORAL_TABLET | Freq: Two times a day (BID) | ORAL | 11 refills | Status: AC

## 2018-07-01 MED ORDER — ALBUTEROL SULFATE (2.5 MG/3ML) 0.083% IN NEBU: 2.5000 mg | INHALATION_SOLUTION | Freq: Four times a day (QID) | RESPIRATORY_TRACT | 12 refills | Status: AC | PRN

## 2018-07-01 MED ORDER — BISACODYL 10 MG RE SUPP: 10.0000 mg | Freq: Every day | RECTAL | 0 refills | Status: AC | PRN

## 2018-07-01 NOTE — Care Management Note (Signed)
Case Management Note  Patient Details  Name: Linnell Swords MRN: [id number] Date of Birth: [birth year]  Subjective/Objective:                    Action/Plan: Pt is discharging to [facility] today. CM signing off.   Expected Discharge Date:  07/01/18               Expected Discharge Plan:  Skilled Nursing Facility  In-House Referral:  Clinical Social Work  Discharge planning Services  CM Consult  Post Acute Care Choice:    Choice offered to:     DME Arranged:    DME Agency:     HH Arranged:    [place] Agency:     Status of Service:  Completed, signed off  If discussed at [employer], dates discussed:    Additional Comments:  Pollie Friar, RN 07/01/2018, 4:48 PM

## 2018-07-01 NOTE — Progress Notes (Signed)
Nutrition Follow-up  DOCUMENTATION CODES:   Severe malnutrition in context of chronic illness, Underweight  INTERVENTION:  Continue Osmolite 1.2 at 56m/hr, Pro-stat 329mdaily  NUTRITION DIAGNOSIS:   Severe Malnutrition related to chronic illness(COPD, Cancer, TIA) as evidenced by severe fat depletion, severe muscle depletion. -ongoing  GOAL:   Patient will meet greater than or equal to 90% of their needs -met with TF  MONITOR:   Weight trends, TF tolerance, I & O's, Labs, Diet advancement  ASSESSMENT:   Patient with PMH HTN, Cancer, COPD, TIA, [facility]  Failled swallow eval; PEG placed 8/2 It is now being utilized. Patient is tolerating TF at this time, no complaints. Continue to monitor tolerance and work towards transitioning her to bolus feeds in the next day or two.  Labs reviewed Medications reviewed and include:  Iron, B12 NS 2079mK+ at 38m46m   Intake/Output Summary (Last 24 hours) at 07/01/2018 1127 Last data filed at 07/01/2018 0646 Gross per 24 hour  Intake 900 ml  Output 600 ml  Net 300 ml  4.5L Fluid Positive  Diet Order:   Diet Order           Diet NPO time specified  Diet effective midnight          EDUCATION NEEDS:   No education needs have been identified at this time  Skin:  Skin Assessment: Skin Integrity Issues: Skin Integrity Issues:: Stage I Stage I: to sacrum Stage II: resolved  Last BM:  06/25/18  Height:   Ht Readings from Last 1 Encounters:  06/21/18 5' 7"  (1.702 m)    Weight:   Wt Readings from Last 1 Encounters:  07/01/18 121 lb 14.6 oz (55.3 kg)    Ideal Body Weight:  61.36 kg  BMI:  Body mass index is 19.09 kg/m.  Estimated Nutritional Needs:   Kcal:  1500-1700 kcal/day  Protein:  75-90 grams/day  Fluid:  1.5-1.7 L/day    WillSatira Anisard, MS, RD LDN Inpatient Clinical Dietitian Pager [phone]

## 2018-07-01 NOTE — Progress Notes (Signed)
Physical Therapy Treatment Patient Details Name: Dawn [name] MRN: [id number] DOB: 10/02/1928 Today's Date: 07/01/2018    History of Present Illness 84 y.o. female with PMH: HTN, cancer, hip arthroplasty who presesnted to [facility] ER with sudden onset left-sided weakness. CT Head positive for non-hemorrhagic right MCA territory infarct.    PT Comments    Patient continues to require +2 assistance for bed mobility and OOB transfers. Pt continues to demonstrate R side gaze preference and L side weakness. Son present during session.  Continue to progress as tolerated with anticipated d/c to SNF for further skilled PT services.    Follow Up Recommendations  SNF;Supervision/Assistance - 24 hour     Equipment Recommendations  None recommended by PT    Recommendations for Other Services       Precautions / Restrictions Precautions Precautions: Fall; abdominal binder on at all times-PEG   Restrictions Weight Bearing Restrictions: No    Mobility  Bed Mobility Overal bed mobility: Needs Assistance Bed Mobility: Rolling;Sidelying to Sit Rolling: Max assist;+2 for safety/equipment Sidelying to sit: Max assist;+2 for physical assistance;HOB elevated       General bed mobility comments: hand over hand assistance for pt to reach and hold onto L bed rail to assist with rolling; assistance required at bilat LE and trunk to come into sitting; multimodal cues needed  Transfers Overall transfer level: Needs assistance Equipment used: 2 person hand held assist(gait belt and bed pad) Transfers: Sit to/from Stand;Stand Pivot Transfers Sit to Stand: Max assist;+2 physical assistance Stand pivot transfers: Max assist;+2 physical assistance       General transfer comment: cues for hand placement and anterior translation of trunk and sequencing; assistance to stand and pivot with use of gait belt and bed pad  Ambulation/Gait                 Stairs              Wheelchair Mobility    Modified Rankin (Stroke Patients Only) Modified Rankin (Stroke Patients Only) Pre-Morbid Rankin Score: Moderately severe disability Modified Rankin: Severe disability     Balance Overall balance assessment: Needs assistance Sitting-balance support: Feet supported;Single extremity supported Sitting balance-Leahy Scale: Poor Sitting balance - Comments: L lateral lean   Standing balance support: Bilateral upper extremity supported Standing balance-Leahy Scale: Zero                              Cognition Arousal/Alertness: Awake/alert Behavior During Therapy: Flat affect(answers questions appropriately) Overall Cognitive Status: Impaired/Different from baseline Area of Impairment: Following commands;Awareness;Safety/judgement;Problem solving;Attention                   Current Attention Level: Sustained Memory: Decreased short-term memory Following Commands: Follows one step commands with increased time;Follows one step commands inconsistently Safety/Judgement: Decreased awareness of safety;Decreased awareness of deficits Awareness: Intellectual Problem Solving: Slow processing;Requires verbal cues;Requires tactile cues;Decreased initiation;Difficulty sequencing General Comments: R side gaze preference      Exercises      General Comments General comments (skin integrity, edema, etc.): pt's som present      Pertinent Vitals/Pain Pain Assessment: Faces Faces Pain Scale: Hurts little more Pain Location: bilat LE with tactile input  Pain Descriptors / Indicators: Grimacing;Guarding Pain Intervention(s): Monitored during session;Limited activity within patient's tolerance;Repositioned    Home Living  Prior Function            PT Goals (current goals can now be found in the care plan section) Acute Rehab PT Goals PT Goal Formulation: With family Time For Goal Achievement: 07/07/18 Potential  to Achieve Goals: Fair Progress towards PT goals: Progressing toward goals    Frequency    Min 3X/week      PT Plan Current plan remains appropriate    Co-evaluation              AM-PAC PT "6 Clicks" Daily Activity  Outcome Measure  Difficulty turning over in bed (including adjusting bedclothes, sheets and blankets)?: Unable Difficulty moving from lying on back to sitting on the side of the bed? : Unable Difficulty sitting down on and standing up from a chair with arms (e.g., wheelchair, bedside commode, etc,.)?: Unable Help needed moving to and from a bed to chair (including a wheelchair)?: A Lot Help needed walking in hospital room?: Total Help needed climbing 3-5 steps with a railing? : Total 6 Click Score: 7    End of Session Equipment Utilized During Treatment: Gait belt Activity Tolerance: Patient tolerated treatment well Patient left: with call bell/phone within reach;with family/visitor present;in chair Nurse Communication: Mobility status PT Visit Diagnosis: Other symptoms and signs involving the nervous system (R29.898);Hemiplegia and hemiparesis Hemiplegia - Right/Left: Left Hemiplegia - dominant/non-dominant: Non-dominant     Time: 1025-8527 PT Time Calculation (min) (ACUTE ONLY): 30 min  Charges:  $Therapeutic Activity: 23-37 mins                     Earney Navy, PTA Pager: [phone]     Darliss Cheney 07/01/2018, 12:19 PM

## 2018-07-01 NOTE — Progress Notes (Signed)
  Speech Language Pathology Treatment: Dysphagia;Cognitive-Linquistic  Patient Details Name: Dawn [name] MRN: [id number] DOB: 14-Feb-1928 Today's Date: 07/01/2018 Time: 2633-3545 SLP Time Calculation (min) (ACUTE ONLY): 8 min  Assessment / Plan / Recommendation Clinical Impression  Son present at bedside and was very eager for SLP to watch as pt was able to better approximate lingual movement to left and turn her head to look at him on the left. He also describes and is very excited that she is using 2 words "want water" with greater intelligibility. Despite Max A pt is not able to form seal around or blow into EMST. He states that he has her blowing into the air during the day to compensate for inability to perform EMST. In reality, pt's ability level hasn't changed functionally and is continues to be very inappropriate for PO trials and is at high risk of aspirating her own saliva as well as tube feeds.  Pt also presents with significant fatigue and inability to keep eyes open after ~ 3 minutes of stimulation. Pt's prognosis for return to PO intake continues to very poor.    HPI HPI: Patient is an 84 y.o. female with PMH: HTN, cancer, hip arthroplasty who presesnted to [facility] ER with sudden onset left-sided weakness while eating breakfast. At baseline, patient walks short distances with walker but is mostly wheelchair bound. She is able to feed herself but needs help with bathing and toileting. CT Head positive for non-hemorrhagic right MCA territory infarct.      SLP Plan  Continue with current plan of care       Recommendations  Diet recommendations: NPO Medication Administration: Via alternative means                Oral Care Recommendations: Oral care QID Follow up Recommendations: Skilled Nursing facility SLP Visit Diagnosis: Dysphagia, oropharyngeal phase (R13.12);Cognitive communication deficit (R41.841) Plan: Continue with current plan of care        Shafer 07/01/2018, 11:03 AM

## 2018-07-01 NOTE — Discharge Summary (Addendum)
Stroke Discharge Summary  Patient ID: Dawn [name]   MRN: [id number]      DOB: [birth year]  Date of Admission: 06/21/2018 Date of Discharge: 07/04/2018  Attending Physician:  Rosalin Hawking, MD, Stroke MD Consultant(s):    Palliative Care Team, Trauma Team (PEG)  Patient's PCP:  Idelle Crouch, MD  DISCHARGE DIAGNOSIS:  Principal Problem:   Acute right arterial ischemic stroke, middle cerebral artery (MCA) ([place]) s/p IV tPA Active Problems:   Essential hypertension   Goals of care, counseling/discussion   Palliative care encounter   Excessive oral secretions   Hypokalemia   Paroxysmal atrial fibrillation ([place])   Oropharyngeal dysphagia   Atrial flutter ([place])   Leukocytosis   Constipation  Past Medical History:  Diagnosis Date  . Cancer ([place])    skin cancer  . 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 Hypertension   . Sciatica    Past Surgical History:  Procedure Laterality Date  . ABDOMINAL HYSTERECTOMY    . APPENDECTOMY    . CATARACT EXTRACTION, BILATERAL    . CHOLECYSTECTOMY    . ENDARTERECTOMY FEMORAL Right 04/19/2017   Procedure: ENDARTERECTOMY COMMON FEMORAL ARTERY, PROFUNDA  FEMORIS ARTERY, and  SUPERFICIAL FEMORAL ARTERY;  Surgeon: Algernon Huxley, MD;  Location: [facility];  Service: Vascular;  Laterality: Right;  . ENDARTERECTOMY FEMORAL Left 11/14/2017   Procedure: ENDARTERECTOMY FEMORAL;  Surgeon: Algernon Huxley, MD;  Location: [facility];  Service: Vascular;  Laterality: Left;  . ESOPHAGOGASTRODUODENOSCOPY (EGD) WITH PROPOFOL N/A 06/28/2018   Procedure: ESOPHAGOGASTRODUODENOSCOPY (EGD) WITH PROPOFOL;  Surgeon: Judeth Horn, MD;  Location: [place];  Service: General;  Laterality: N/A;  . HIP SURGERY Bilateral   . LOWER EXTREMITY ANGIOGRAM Right 04/19/2017   Procedure: LOWER EXTREMITY ANGIOGRAM WITH SUPERFICIAL FEMORAL ARTERY STENT PLACEMENT;  Surgeon: Algernon Huxley, MD;  Location: [facility];  Service:  Vascular;  Laterality: Right;  . LOWER EXTREMITY ANGIOGRAPHY Right 04/18/2017   Procedure: Lower Extremity Angiography;  Surgeon: Algernon Huxley, MD;  Location: [place] [facility];  Service: Cardiovascular;  Laterality: Right;  . LOWER EXTREMITY ANGIOGRAPHY Left 07/12/2017   Procedure: Lower Extremity Angiography;  Surgeon: Algernon Huxley, MD;  Location: [place] [facility];  Service: Cardiovascular;  Laterality: Left;  . LOWER EXTREMITY ANGIOGRAPHY Left 11/09/2017   Procedure: LOWER EXTREMITY ANGIOGRAPHY;  Surgeon: Algernon Huxley, MD;  Location: [place] [facility];  Service: Cardiovascular;  Laterality: Left;  . LOWER EXTREMITY INTERVENTION  07/12/2017   Procedure: Lower Extremity Intervention;  Surgeon: Algernon Huxley, MD;  Location: [place] [facility];  Service: Cardiovascular;;  . LOWER EXTREMITY INTERVENTION  11/09/2017   Procedure: LOWER EXTREMITY INTERVENTION;  Surgeon: Algernon Huxley, MD;  Location: [place] [facility];  Service: Cardiovascular;;  . PEG PLACEMENT N/A 06/28/2018   Procedure: PERCUTANEOUS ENDOSCOPIC GASTROSTOMY (PEG) PLACEMENT;  Surgeon: Judeth Horn, MD;  Location: [place];  Service: General;  Laterality: N/A;  . RENAL ANGIOGRAPHY N/A 06/21/2017   Procedure: Renal Angiography;  Surgeon: Algernon Huxley, MD;  Location: [place] [facility];  Service: Cardiovascular;  Laterality: N/A;  . SHOULDER SURGERY Bilateral   . TOTAL HIP ARTHROPLASTY Bilateral     Allergies as of 07/04/2018      Reactions   Percocet [oxycodone-acetaminophen] Itching   Percodan [oxycodone-aspirin] Itching      Medication List    STOP taking these medications   acetaminophen 325 MG  tablet Commonly known as:  TYLENOL Replaced by:  acetaminophen 160 MG/5ML solution   acidophilus Caps capsule   aspirin EC 81 MG tablet   B-12 5000 MCG Caps Replaced by:  Cyanocobalamin 2500 MCG Tabs   clopidogrel 75 MG tablet Commonly known as:  PLAVIX   doxycycline 100 MG tablet Commonly known as:   VIBRA-TABS   ferrous sulfate 325 (65 FE) MG tablet Replaced by:  ferrous sulfate 300 (60 Fe) MG/5ML syrup   furosemide 20 MG tablet Commonly known as:  LASIX   indomethacin 50 MG capsule Commonly known as:  INDOCIN   ipratropium-albuterol 0.5-2.5 (3) MG/3ML Soln Commonly known as:  DUONEB   ketotifen 0.025 % ophthalmic solution Commonly known as:  ZADITOR   LEG-GESIC PO   metoprolol succinate 25 MG 24 hr tablet Commonly known as:  TOPROL-XL   ondansetron 4 MG disintegrating tablet Commonly known as:  ZOFRAN-ODT   pantoprazole 40 MG tablet Commonly known as:  PROTONIX Replaced by:  pantoprazole sodium 40 mg/20 mL Pack   predniSONE 10 MG tablet Commonly known as:  DELTASONE   senna-docusate 8.6-50 MG tablet Commonly known as:  Senokot-S   traMADol 50 MG tablet Commonly known as:  ULTRAM   Vitamin D3 400 units Caps     TAKE these medications   acetaminophen 160 MG/5ML solution Commonly known as:  TYLENOL Place 20.3 mLs (650 mg total) into feeding tube every 6 (six) hours as needed for moderate pain. Replaces:  acetaminophen 325 MG tablet   albuterol (2.5 MG/3ML) 0.083% nebulizer solution Commonly known as:  PROVENTIL Take 3 mLs (2.5 mg total) by nebulization every 6 (six) hours as needed for wheezing or shortness of breath.   amLODipine 10 MG tablet Commonly known as:  NORVASC Place 1 tablet (10 mg total) into feeding tube daily. What changed:  how to take this   apixaban 2.5 MG Tabs tablet Commonly known as:  ELIQUIS Take 1 tablet (2.5 mg total) by mouth 2 (two) times daily.   artificial tears Oint ophthalmic ointment Commonly known as:  LACRILUBE Place into both eyes at bedtime.   atorvastatin 20 MG tablet Commonly known as:  LIPITOR Place 1 tablet (20 mg total) into feeding tube daily at 6 PM. What changed:  See the new instructions.   bisacodyl 10 MG suppository Commonly known as:  DULCOLAX Place 1 suppository (10 mg total) rectally daily as  needed for moderate constipation.   cloNIDine 0.2 MG tablet Commonly known as:  CATAPRES Place 1 tablet (0.2 mg total) into feeding tube 2 (two) times daily. What changed:    medication strength  how much to take  how to take this   Cyanocobalamin 2500 MCG Tabs Place 5,000 mcg into feeding tube daily. Replaces:  B-12 5000 MCG Caps   feeding supplement (OSMOLITE 1.2 CAL) Liqd Place 1,000 mLs into feeding tube continuous.   feeding supplement (PRO-STAT SUGAR FREE 64) Liqd Place 30 mLs into feeding tube daily.   ferrous sulfate 300 (60 Fe) MG/5ML syrup Place 5 mLs (300 mg total) into feeding tube 2 (two) times daily with a meal. Replaces:  ferrous sulfate 325 (65 FE) MG tablet   free water Soln Place 200 mLs into feeding tube every 6 (six) hours.   glycopyrrolate 1 MG tablet Commonly known as:  ROBINUL Take 1 tablet (1 mg total) by mouth 3 (three) times daily.   metoprolol tartrate 25 mg/10 mL Susp Commonly known as:  LOPRESSOR Place 20 mLs (50 mg total) into  feeding tube 2 (two) times daily.   pantoprazole sodium 40 mg/20 mL Pack Commonly known as:  PROTONIX Place 20 mLs (40 mg total) into feeding tube daily. Replaces:  pantoprazole 40 MG tablet   polyvinyl alcohol 1.4 % ophthalmic solution Commonly known as:  LIQUIFILM TEARS Place 1 drop into both eyes every 4 (four) hours while awake.       LABORATORY STUDIES CBC    Component Value Date/Time   WBC 10.6 (H) 07/03/2018 0303   RBC 3.27 (L) 07/03/2018 0303   HGB 9.0 (L) 07/03/2018 0303   HCT 29.8 (L) 07/03/2018 0303   PLT 297 07/03/2018 0303   MCV 91.1 07/03/2018 0303   MCH 27.5 07/03/2018 0303   MCHC 30.2 07/03/2018 0303   RDW 16.1 (H) 07/03/2018 0303   LYMPHSABS 1.6 07/01/2018 0420   MONOABS 1.0 07/01/2018 0420   EOSABS 0.3 07/01/2018 0420   BASOSABS 0.1 07/01/2018 0420   CMP    Component Value Date/Time   NA 135 07/03/2018 0303   K 3.8 07/03/2018 0303   CL 98 07/03/2018 0303   CO2 26 07/03/2018  0303   GLUCOSE 126 (H) 07/03/2018 0303   BUN 43 (H) 07/03/2018 0303   CREATININE 0.93 07/03/2018 0303   CREATININE 0.92 03/13/2012 0805   CALCIUM 8.4 (L) 07/03/2018 0303   PROT 5.9 (L) 07/03/2018 0303   ALBUMIN 2.4 (L) 07/03/2018 0303   AST 39 07/03/2018 0303   ALT 14 07/03/2018 0303   ALKPHOS 75 07/03/2018 0303   BILITOT 0.4 07/03/2018 0303   GFRNONAA 54 (L) 07/03/2018 0303   GFRNONAA 58 (L) 03/13/2012 0805   GFRAA >60 07/03/2018 0303   GFRAA >60 03/13/2012 0805   COAGS Lab Results  Component Value Date   INR 1.02 06/21/2018   INR 0.90 02/23/2018   INR 0.97 01/22/2018   Lipid Panel    Component Value Date/Time   CHOL 103 06/22/2018 0151   TRIG 67 06/22/2018 0151   HDL 48 06/22/2018 0151   CHOLHDL 2.1 06/22/2018 0151   VLDL 13 06/22/2018 0151   LDLCALC 42 06/22/2018 0151   HgbA1C  Lab Results  Component Value Date   HGBA1C 5.7 (H) 06/22/2018   Urinalysis    Component Value Date/Time   COLORURINE YELLOW 07/02/2018 2335   APPEARANCEUR CLEAR 07/02/2018 2335   LABSPEC 1.014 07/02/2018 2335   PHURINE 7.0 07/02/2018 2335   GLUCOSEU 50 (A) 07/02/2018 2335   HGBUR NEGATIVE 07/02/2018 2335   BILIRUBINUR NEGATIVE 07/02/2018 2335   KETONESUR NEGATIVE 07/02/2018 2335   PROTEINUR 30 (A) 07/02/2018 2335   NITRITE NEGATIVE 07/02/2018 2335   LEUKOCYTESUR NEGATIVE 07/02/2018 2335   Osmolality, Ur 300 - 900 mOsm/kg 496    Urine Drug Screen     Component Value Date/Time   LABOPIA NONE DETECTED 01/22/2018 1700   COCAINSCRNUR NONE DETECTED 01/22/2018 1700   LABBENZ NONE DETECTED 01/22/2018 1700   AMPHETMU NONE DETECTED 01/22/2018 1700   THCU NONE DETECTED 01/22/2018 1700   LABBARB NONE DETECTED 01/22/2018 1700    Alcohol Level    Component Value Date/Time   ETH <10 01/22/2018 2013    SIGNIFICANT DIAGNOSTIC STUDIES Ct Angio Head W Or Wo Contrast  Result Date: 06/21/2018 CLINICAL DATA:  Stroke, follow-up. Acute onset of left-sided weakness and facial droop with ice  fixed to the right. EXAM: CT ANGIOGRAPHY HEAD AND NECK CT PERFUSION BRAIN TECHNIQUE: Multidetector CT imaging of the head and neck was performed using the standard protocol during bolus administration of intravenous  contrast. Multiplanar CT image reconstructions and MIPs were obtained to evaluate the vascular anatomy. Carotid stenosis measurements (when applicable) are obtained utilizing NASCET criteria, using the distal internal carotid diameter as the denominator. Multiphase CT imaging of the brain was performed following IV bolus contrast injection. Subsequent parametric perfusion maps were calculated using RAPID software. CONTRAST:  141m ISOVUE-370 IOPAMIDOL (ISOVUE-370) INJECTION 76% COMPARISON:  CT head without contrast from the same day. MRI brain and MRA circle[place]of[place] 01/23/2018. FINDINGS: CTA NECK FINDINGS Aortic arch: A 3 vessel arch configuration is present. Extensive atherosclerotic calcifications are present without a significant stenosis of the great vessel origins. There is no aneurysm. Right carotid system: Atherosclerotic calcifications are present along the wall of the right common carotid artery without a significant stenosis. There is calcified and noncalcified plaque through the right carotid bifurcation. There is no focal luminal stenosis relative to the more distal vessel. The cervical right ICA is within normal limits. Left carotid system: The left common carotid artery demonstrates mural calcifications without a significant luminal stenosis. Dense calcifications are present at the left carotid bifurcation. The lumen is narrowed to less than 1 mm. Additional distal calcifications are present in the proximal left ICA without a significant tandem stenosis. Vertebral arteries: The right vertebral artery originates from the subclavian artery without significant stenosis. There is some tortuosity and calcification of the cervical right ICA without a significant stenosis. The left vertebral  artery is occluded below the C1-2 level. Skeleton: Multilevel degenerative changes are noted in the cervical spine. Degenerative anterolisthesis is present at C3-4 and to a greater extent at C4-5. There is chronic loss of disc space at C5-6 and C6-7. No focal lytic or blastic lesions are present. Alignment is otherwise anatomic. Other neck: The soft tissues the neck are otherwise unremarkable. There is no significant adenopathy. Thyroid is within normal limits. Salivary glands are unremarkable. Upper chest: Mild dependent atelectasis is present. The upper lung fields are otherwise clear without focal nodule, mass, or airspace disease. Ill-defined density in the left upper lobe is stable, likely remote scarring. Review of the MIP images confirms the above findings CTA HEAD FINDINGS Anterior circulation: Atherosclerotic calcifications are present through the cavernous internal carotid arteries bilaterally. There is no significant luminal stenosis relative to the ICA termini. The right A1 segment is dominant. There is mild narrowing of the proximal left M1 segment. The middle right M2 branch is occluded. There is distal left MCA atherosclerotic disease without focal stenosis or occlusion. Significant attenuation of branch vessels in the distal right MCA territory. No significant collaterals are present. Posterior circulation: The left vertebral artery is reconstituted at C1. Dense calcification is present at the dural margin of the vertebral artery on the left. The distal vertebral artery is opacified. The right vertebral artery is within normal limits. PICA origin is high. Basilar artery is within normal limits. Both posterior cerebral arteries originate from the basilar tip. There is mild distal small vessel disease without a significant proximal stenosis or occlusion. Venous sinuses: The dural sinuses are patent. The right transverse sinus is dominant. The straight sinus and deep cerebral veins are intact. Cortical  veins are unremarkable. Anatomic variants: None Delayed phase: Postcontrast images demonstrate early loss of gray-white differentiation in the anterior right frontal lobe. Review of the MIP images confirms the above findings CT Brain Perfusion Findings: CBF (<30%) Volume: 969mPerfusion (Tmax>6.0s) volume: 13633mismatch Volume: 14m26mfarction Location:Right frontal lobe IMPRESSION: 1. Acute infarct of the right frontal lobe. Core infarct volume is 91  mL. 2. Occluded right M2 branch at the MCA bifurcation without distal reconstitution or collateral vessels. This corresponds to the infarct territory 3. Mild narrowing of the left M1 segment as described. 4. Diffuse distal small vessel disease without other significant proximal stenosis or occlusion in the circle-of-Willis. 5. High-grade stenosis of the left internal carotid artery at the bifurcation. 6. Atherosclerotic changes involving the right carotid bifurcation without significant stenosis. 7. Extensive atherosclerotic disease at the aortic arch without significant stenosis. 8. 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 It is reconstituted at C1-2 level. 9. Multilevel degenerative changes in the cervical spine These results were called by telephone at the time of interpretation on 06/21/2018 at 12:52 pm to Dr. Alexis Goodell , who verbally acknowledged these results. Electronically Signed   By: San Morelle M.D.   On: 06/21/2018 13:01   Ct Head Wo Contrast  Result Date: 06/28/2018 CLINICAL DATA:  Stroke follow-up EXAM: CT HEAD WITHOUT CONTRAST TECHNIQUE: Contiguous axial images were obtained from the base of the skull through the vertex without intravenous contrast. COMPARISON:  06/22/2018 FINDINGS: Brain: Continued evolution of right MCA territory large infarct. There is a suspected small focus of petechial hemorrhage measuring approximately 4 x 2 mm 3 mm of leftward midline shift, slightly greater than on the prior study. Size and configuration of the  ventricles are unchanged. Old left basal ganglia small vessel infarct is unchanged. There is hypoattenuation of the periventricular white matter, most commonly indicating chronic ischemic microangiopathy. Vascular: Atherosclerotic calcification of the internal carotid arteries at the skull base. No abnormal hyperdensity of the major intracranial arteries or dural venous sinuses. Skull: The visualized skull base, calvarium and extracranial soft tissues are normal. Sinuses/Orbits: No fluid levels or advanced mucosal thickening of the visualized paranasal sinuses. No mastoid or middle ear effusion. The orbits are normal. IMPRESSION: 1. Continued evolution of right MCA infarct with progression of cytotoxic edema and minimally increased leftward midline shift. 2. Suspected 4 x 2 mm focus of petechial hemorrhage within the infarcted area. No hemorrhagic conversion. These results were called by telephone at the time of interpretation on 06/28/2018 at 8:18 pm to Dr. Rory Percy , who verbally acknowledged these results. Electronically Signed   By: Ulyses Jarred M.D.   On: 06/28/2018 20:18   Ct Head Wo Contrast  Result Date: 06/22/2018 CLINICAL DATA:  Focal neuro deficit, less than 6 hours. TPA 24 hours ago. EXAM: CT HEAD WITHOUT CONTRAST TECHNIQUE: Contiguous axial images were obtained from the base of the skull through the vertex without intravenous contrast. COMPARISON:  CT head without contrast 06/21/2018. CTA head and neck and CT perfusion head 06/21/2018. FINDINGS: Brain: A large right MCA territory infarct corresponds to the area of core infarct on the CT perfusion exam. There is involvement of the right frontal operculum and insular cortex. Basal ganglia are seen. Right ACA territory is spared. There is local mass effect with effacement of the adjacent sulci. There is no midline shift. There is partial effacement of the right lateral ventricle. Left lateral ventricle is within normal limits. Brainstem and cerebellum are  normal. There is no hemorrhage into the infarct. Vascular: Atherosclerotic calcifications are present within the cavernous internal carotid arteries bilaterally. There is no hyperdense vessel. Skull: Calvarium is intact. No focal lytic or blastic lesions are present. Sinuses/Orbits: The paranasal sinuses and mastoid air cells are clear. Bilateral lens replacements are present. Globes and orbits are otherwise within normal limits. IMPRESSION: 1. Involving nonhemorrhagic right MCA territory infarct corresponds with the area predicted  by CT perfusion. 2. No hemorrhage within the infarct. 3. Local mass effect without significant midline shift. 4. Generalized atrophy and white matter disease is otherwise stable. Electronically Signed   By: San Morelle M.D.   On: 06/22/2018 12:09   Ct Angio Neck W Or Wo Contrast  Result Date: 06/21/2018 CLINICAL DATA:  Stroke, follow-up. Acute onset of left-sided weakness and facial droop with ice fixed to the right. EXAM: CT ANGIOGRAPHY HEAD AND NECK CT PERFUSION BRAIN TECHNIQUE: Multidetector CT imaging of the head and neck was performed using the standard protocol during bolus administration of intravenous contrast. Multiplanar CT image reconstructions and MIPs were obtained to evaluate the vascular anatomy. Carotid stenosis measurements (when applicable) are obtained utilizing NASCET criteria, using the distal internal carotid diameter as the denominator. Multiphase CT imaging of the brain was performed following IV bolus contrast injection. Subsequent parametric perfusion maps were calculated using RAPID software. CONTRAST:  13m ISOVUE-370 IOPAMIDOL (ISOVUE-370) INJECTION 76% COMPARISON:  CT head without contrast from the same day. MRI brain and MRA circle-of-Willis 01/23/2018. FINDINGS: CTA NECK FINDINGS Aortic arch: A 3 vessel arch configuration is present. Extensive atherosclerotic calcifications are present without a significant stenosis of the great vessel  origins. There is no aneurysm. Right carotid system: Atherosclerotic calcifications are present along the wall of the right common carotid artery without a significant stenosis. There is calcified and noncalcified plaque through the right carotid bifurcation. There is no focal luminal stenosis relative to the more distal vessel. The cervical right ICA is within normal limits. Left carotid system: The left common carotid artery demonstrates mural calcifications without a significant luminal stenosis. Dense calcifications are present at the left carotid bifurcation. The lumen is narrowed to less than 1 mm. Additional distal calcifications are present in the proximal left ICA without a significant tandem stenosis. Vertebral arteries: The right vertebral artery originates from the subclavian artery without significant stenosis. There is some tortuosity and calcification of the cervical right ICA without a significant stenosis. The left vertebral artery is occluded below the C1-2 level. Skeleton: Multilevel degenerative changes are noted in the cervical spine. Degenerative anterolisthesis is present at C3-4 and to a greater extent at C4-5. There is chronic loss of disc space at C5-6 and C6-7. No focal lytic or blastic lesions are present. Alignment is otherwise anatomic. Other neck: The soft tissues the neck are otherwise unremarkable. There is no significant adenopathy. Thyroid is within normal limits. Salivary glands are unremarkable. Upper chest: Mild dependent atelectasis is present. The upper lung fields are otherwise clear without focal nodule, mass, or airspace disease. Ill-defined density in the left upper lobe is stable, likely remote scarring. Review of the MIP images confirms the above findings CTA HEAD FINDINGS Anterior circulation: Atherosclerotic calcifications are present through the cavernous internal carotid arteries bilaterally. There is no significant luminal stenosis relative to the ICA termini. The  right A1 segment is dominant. There is mild narrowing of the proximal left M1 segment. The middle right M2 branch is occluded. There is distal left MCA atherosclerotic disease without focal stenosis or occlusion. Significant attenuation of branch vessels in the distal right MCA territory. No significant collaterals are present. Posterior circulation: The left vertebral artery is reconstituted at C1. Dense calcification is present at the dural margin of the vertebral artery on the left. The distal vertebral artery is opacified. The right vertebral artery is within normal limits. PICA origin is high. Basilar artery is within normal limits. Both posterior cerebral arteries originate from the basilar  tip. There is mild distal small vessel disease without a significant proximal stenosis or occlusion. Venous sinuses: The dural sinuses are patent. The right transverse sinus is dominant. The straight sinus and deep cerebral veins are intact. Cortical veins are unremarkable. Anatomic variants: None Delayed phase: Postcontrast images demonstrate early loss of gray-white differentiation in the anterior right frontal lobe. Review of the MIP images confirms the above findings CT Brain Perfusion Findings: CBF (<30%) Volume: 28m Perfusion (Tmax>6.0s) volume: 137mMismatch Volume: 4526mnfarction Location:Right frontal lobe IMPRESSION: 1. Acute infarct of the right frontal lobe. Core infarct volume is 91 mL. 2. Occluded right M2 branch at the MCA bifurcation without distal reconstitution or collateral vessels. This corresponds to the infarct territory 3. Mild narrowing of the left M1 segment as described. 4. Diffuse distal small vessel disease without other significant proximal stenosis or occlusion in the circle-of-Willis. 5. High-grade stenosis of the left internal carotid artery at the bifurcation. 6. Atherosclerotic changes involving the right carotid bifurcation without significant stenosis. 7. Extensive atherosclerotic  disease at the aortic arch without significant stenosis. 8. 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 It is reconstituted at C1-2 level. 9. Multilevel degenerative changes in the cervical spine These results were called by telephone at the time of interpretation on 06/21/2018 at 12:52 pm to Dr. LESAlexis Goodellwho verbally acknowledged these results. Electronically Signed   By: ChrSan MorelleD.   On: 06/21/2018 13:01   Ct Cerebral Perfusion W Contrast  Result Date: 06/21/2018 CLINICAL DATA:  Stroke, follow-up. Acute onset of left-sided weakness and facial droop with ice fixed to the right. EXAM: CT ANGIOGRAPHY HEAD AND NECK CT PERFUSION BRAIN TECHNIQUE: Multidetector CT imaging of the head and neck was performed using the standard protocol during bolus administration of intravenous contrast. Multiplanar CT image reconstructions and MIPs were obtained to evaluate the vascular anatomy. Carotid stenosis measurements (when applicable) are obtained utilizing NASCET criteria, using the distal internal carotid diameter as the denominator. Multiphase CT imaging of the brain was performed following IV bolus contrast injection. Subsequent parametric perfusion maps were calculated using RAPID software. CONTRAST:  100m87mOVUE-370 IOPAMIDOL (ISOVUE-370) INJECTION 76% COMPARISON:  CT head without contrast from the same day. MRI brain and MRA circle-of-Willis 01/23/2018. FINDINGS: CTA NECK FINDINGS Aortic arch: A 3 vessel arch configuration is present. Extensive atherosclerotic calcifications are present without a significant stenosis of the great vessel origins. There is no aneurysm. Right carotid system: Atherosclerotic calcifications are present along the wall of the right common carotid artery without a significant stenosis. There is calcified and noncalcified plaque through the right carotid bifurcation. There is no focal luminal stenosis relative to the more distal vessel. The cervical right ICA is within normal  limits. Left carotid system: The left common carotid artery demonstrates mural calcifications without a significant luminal stenosis. Dense calcifications are present at the left carotid bifurcation. The lumen is narrowed to less than 1 mm. Additional distal calcifications are present in the proximal left ICA without a significant tandem stenosis. Vertebral arteries: The right vertebral artery originates from the subclavian artery without significant stenosis. There is some tortuosity and calcification of the cervical right ICA without a significant stenosis. The left vertebral artery is occluded below the C1-2 level. Skeleton: Multilevel degenerative changes are noted in the cervical spine. Degenerative anterolisthesis is present at C3-4 and to a greater extent at C4-5. There is chronic loss of disc space at C5-6 and C6-7. No focal lytic or blastic lesions are present. Alignment is otherwise anatomic.  Other neck: The soft tissues the neck are otherwise unremarkable. There is no significant adenopathy. Thyroid is within normal limits. Salivary glands are unremarkable. Upper chest: Mild dependent atelectasis is present. The upper lung fields are otherwise clear without focal nodule, mass, or airspace disease. Ill-defined density in the left upper lobe is stable, likely remote scarring. Review of the MIP images confirms the above findings CTA HEAD FINDINGS Anterior circulation: Atherosclerotic calcifications are present through the cavernous internal carotid arteries bilaterally. There is no significant luminal stenosis relative to the ICA termini. The right A1 segment is dominant. There is mild narrowing of the proximal left M1 segment. The middle right M2 branch is occluded. There is distal left MCA atherosclerotic disease without focal stenosis or occlusion. Significant attenuation of branch vessels in the distal right MCA territory. No significant collaterals are present. Posterior circulation: The left vertebral  artery is reconstituted at C1. Dense calcification is present at the dural margin of the vertebral artery on the left. The distal vertebral artery is opacified. The right vertebral artery is within normal limits. PICA origin is high. Basilar artery is within normal limits. Both posterior cerebral arteries originate from the basilar tip. There is mild distal small vessel disease without a significant proximal stenosis or occlusion. Venous sinuses: The dural sinuses are patent. The right transverse sinus is dominant. The straight sinus and deep cerebral veins are intact. Cortical veins are unremarkable. Anatomic variants: None Delayed phase: Postcontrast images demonstrate early loss of gray-white differentiation in the anterior right frontal lobe. Review of the MIP images confirms the above findings CT Brain Perfusion Findings: CBF (<30%) Volume: 59m Perfusion (Tmax>6.0s) volume: 1386mMismatch Volume: 4528mnfarction Location:Right frontal lobe IMPRESSION: 1. Acute infarct of the right frontal lobe. Core infarct volume is 91 mL. 2. Occluded right M2 branch at the MCA bifurcation without distal reconstitution or collateral vessels. This corresponds to the infarct territory 3. Mild narrowing of the left M1 segment as described. 4. Diffuse distal small vessel disease without other significant proximal stenosis or occlusion in the circle-of-Willis. 5. High-grade stenosis of the left internal carotid artery at the bifurcation. 6. Atherosclerotic changes involving the right carotid bifurcation without significant stenosis. 7. Extensive atherosclerotic disease at the aortic arch without significant stenosis. 8. 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 It is reconstituted at C1-2 level. 9. Multilevel degenerative changes in the cervical spine These results were called by telephone at the time of interpretation on 06/21/2018 at 12:52 pm to Dr. LESAlexis Goodellwho verbally acknowledged these results. Electronically Signed   By:  ChrSan MorelleD.   On: 06/21/2018 13:01   Dg Chest Port 1 View  Result Date: 06/30/2018 CLINICAL DATA:  Fevers EXAM: PORTABLE CHEST 1 VIEW COMPARISON:  06/27/2018 FINDINGS: Feeding catheter has been removed in the interval. Cardiac shadow is within normal limits. Aortic calcifications are seen. Mild chronic interstitial changes are noted stable from the prior exam. No new focal infiltrate or sizable effusion is seen. IMPRESSION: Chronic changes without acute abnormality. Electronically Signed   By: MarInez CatalinaD.   On: 06/30/2018 09:33   Dg Chest Port 1 View  Result Date: 06/27/2018 CLINICAL DATA:  Leukocytosis. EXAM: PORTABLE CHEST 1 VIEW COMPARISON:  03/07/2018. FINDINGS: Normal sized heart. Tortuous aorta. Small right pleural effusion. Stable mildly prominent interstitial markings. Thoracolumbar spine degenerative changes. Bilateral shoulder degenerative changes with superior migration of the right humeral head and bony remodeling compatible with a chronic large right rotator cuff tear. Feeding tube extending into the  stomach. IMPRESSION: 1. Small right pleural effusion. 2. Stable chronic interstitial lung disease. Electronically Signed   By: Claudie Revering M.D.   On: 06/27/2018 10:49   Ct Head Code Stroke Wo Contrast  Result Date: 06/21/2018 CLINICAL DATA:  Code stroke. Focal neuro deficit, less than 6 hours, stroke suspected. Patient found unresponsive 1 hour ago. Acute onset of left-sided weakness and facial droop with ice fixed to the right. EXAM: CT HEAD WITHOUT CONTRAST TECHNIQUE: Contiguous axial images were obtained from the base of the skull through the vertex without intravenous contrast. COMPARISON:  CT head without contrast 02/22/2018 FINDINGS: Brain: Atrophy and moderate diffuse white matter changes are again seen bilaterally. No acute cortical infarct, hemorrhage, or mass lesion is present. The basal ganglia are intact. Insular ribbon is normal bilaterally. No acute or focal  right-sided infarct is present. The ventricles are proportionate to the degree of atrophy and stable. The brainstem and cerebellum are normal. Vascular: Atherosclerotic calcifications are present in the cavernous and precavernous internal carotid arteries. There is no hyperdense vessel. Skull: Calvarium is intact. No focal lytic or blastic lesions are present. Sinuses/Orbits: Paranasal sinuses and mastoid air cells are clear. Bilateral lens replacements are present. Globes and orbits are otherwise within normal limits. ASPECTS [facility] Stroke Program Early CT Score) - Ganglionic level infarction (caudate, lentiform nuclei, internal capsule, insula, M1-M3 cortex): 7/7 - Supraganglionic infarction (M4-M6 cortex): 3/3 Total score (0-10 with 10 being normal): 10/10 IMPRESSION: 1. No acute intracranial abnormality. 2. ASPECTS is 10/10 3. Moderate atrophy and white matter disease is stable bilaterally. These results were called by telephone at the time of interpretation on 06/21/2018 at 11:03 am to Dr. Harvest Dark , who verbally acknowledged these results. Electronically Signed   By: San Morelle M.D.   On: 06/21/2018 11:05    Transthoracic Echocardiogram  06/22/2018 Study Conclusions - Left ventricle: The cavity size was normal. Systolic function wasvigorous. The estimated ejection fraction was in the range of 65%to 70%. Wall motion was normal; there were no regional wallmotion abnormalities. Features are consistent with a pseudonormalleft ventricular filling pattern, with concomitant abnormalrelaxation and increased filling pressure (grade 2 diastolicdysfunction). Doppler parameters are consistent with highventricular filling pressure. - Aortic valve: Poorly visualized. Severely calcified annulus.Trileaflet; moderately thickened, moderately calcified leaflets. - Mitral valve: Severely calcified annulus. Moderately thickened,mildly calcified leaflets anterior. There was mild to  moderateregurgitation. - Atrial septum: There was increased thickness of the septum,consistent with lipomatous hypertrophy. Tricuspid valve: There was mild regurgitation. - Pulmonic valve: There was mild regurgitation.  PEG placement 06/28/2018 Hulen Skains)     HISTORY OF PRESENT ILLNESS Zelma Hanks Khalsa is an 84 y.o. female with past medical history of hypertension, cancer, hip arthroplasty who presented to [facility] emergency room with sudden onset left-sided weakness while eating breakfast.  Her last known normal  She was LKW 10:15a 06/21/2018. Symptoms witnessed by family.  Patient had an initial NIH stroke scale of 18 and received IV TPA at [facility].  CT angiogram shows occluded right M2 MCA branch and CT perfusion showed large score of 91 mL, therefore not a candidate for mechanical thrombectomy.  Transferred to [facility] neuro ICU for further evaluation management. NIHSS: 18. Baseline MRS 4  Patient's baseline she walks with a walker for short distances but mostly wheelchair-bound.  Feeds herself.  Needs help taking baths, going to the bathroom.   HOSPITAL COURSE Ms. Kataryna Hanks Bleich is a 84 y.o. female with history of LLE PVD with stenting, hip  surgery, TIA, cancer, and HTN presenting with left sided weakness. The patient received IV TPA at [facility] Friday AM 06/21/2018. Transferred to [facility] for post stroke care. Infarct large with permanent neuro deficits including R hemiparesis and dysphagia requiring PEG. Palliative care met with family. They are agreeable to DNR but want full aggressive care. Plan for SNF at d/c. PEG placed 8/2 without difficulty, tube feedings resumed 8/3. Infarct was embolic secondary to known AF not on AC. Post PEG, started on Eliquis for secondary stroke prevention. BP medications adjusted in hospital with normotensive goal. Treated leukocytosis prophylactically with Rocephin x 3 days (T 99, UA neg, CXR neg).   Stroke:  Large right MCA infarct s/p IV tPA - embolic due to A. fib/aflutter not on AC.  Resultant  Left side hemiplegia, right gaze preference  CT head - No acute intracranial abnormality.   CTA H&N - Occluded right M2 branch. High-grade stenosis of the left internal carotid furcation  CT perfusion - large infarct core  MRI head - not performed due to "metal in body" as per family  2D Echo - EF 65% to 70%. No cardiac source of emboli identified.  LDL 42  HgbA1c - 5.7  aspirin 81 mg daily and clopidogrel 75 mg daily prior to admission, treated with aspirin 334m daily and changed to eliquis 2.5 mg bid post PEG  Ongoing aggressive stroke risk factor management  Therapy recommendations:  SNF   Disposition:  SNF   A. fib/a flutter not on AC  As per family, patient has diagnosed with A. fib in the past not on AC, but on aspirin and Plavix  AF confirmed in ICU  On Metoprolol  Rate controlled  treated with aspirin 3237mdaily in hospital and changed to eliquis 2.5 mg bid post PEG  Hyperlipidemia  LDL 42, goal LDL < 70   Lipitor 20 PTA, resumed in hospital  Continue statin at d/c  Hypertension  Stable on the high end, 160-180  Treated post tPA w/ cleviprex  Now on: Norvasc 10, clonidine 0.2 bid, metoprolol 50 mg bid   Gradually normalize BP  Dysphagia  Did not pass swallow  PEG tube placed Friday 8/2 by Trauma   Has continuous infusion tube feedings  Change to bolus feeds when settled in facility  Continue Speech therapy  Mouth care  Needs ongoing attentive oral care to prevent aspiration given continued oral secretions and prevent desaturation.   Robinul 1 mg tid added at time of d/c (had been on IV Robinul previously in hospitalization)  Other Stroke Risk Factors  Advanced age  Former cigarette smoker - quit  Other Active Problems  Leukocytosis, afebrile  - resolving - 16->14.1->13.6->10.6. Etiology unclear, ? R/t PEG placed 8/2 vs aspiration.  CXR NAD. UA without infection. Afebrile Blood cx no growth. Treated with Rocephin x 3 days.   Elevated creatinine, resolved 0.93  Hypokalemia, resolved - supplement 3.8  Severe malnutrition in context of chronic illness  Palliative care consulted. Plans for full treatment  Possible Constipation, resolved with dulcolax   Eyes - yellow drainage resolved. sleeps with eyes opened at home per son -continue Lacrilube at hs and artifical tears during the day (uses zaditar OTC eye drops prn at home)  Lethargy, Post PEG placement, resolved   DISCHARGE EXAM Blood pressure (!) 166/68, pulse 84, temperature 99.4 F (37.4 C), temperature source Oral, resp. rate 20, height 5' 7"  (1.702 m), weight 55.1 kg, SpO2 94 %. General - cachectic, frail, well developed, in  no distress.    Cardiovascular - Regular rate and rhythm, not in A. fib.  Abdomen  - soft, non-distended. Bowel sounds present. PEG site dressing CD&I  Neuro - Knows name, place, situation (stroke).  Follows simple commands appropriately. Can show 2 fingers. Dysarthric. Can repeat. Right gaze preference, able to cross midline, PERRL, not blinking to visual threat on the left. Can count fingers. Left facial droop mild, tongue midline. Left upper extremity 1/5 with pain summation, left lower extremity 0/5 proximal and 2/5 distally.  Right upper and lower extremity 3/5. Diffuse ecchymoses improving BUEs, L>R. DTR 1+, no Babinski. Sensation intact. Coordination not cooperative and gait not tested.  Discharge Diet   NPO, tube feedings via PEG  DISCHARGE PLAN  Disposition:  Discharge to skilled nursing facility for ongoing PT, OT and ST.   Eliquis (apixaban) daily for secondary stroke prevention.  Change continuous tube feeds to bolus tube feeds per facility schedule once stable there  Ongoing oral care to prevent aspiriation.  Ongoing risk factor control by Primary Care Physician at time of discharge  Follow-up Idelle Crouch, MD  in 2 weeks.  Follow-up in [facility] in 4 weeks, office to schedule an appointment.   40 minutes were spent preparing discharge.  Burnetta Sabin, MSN, APRN, ANVP-BC, AGPCNP-BC Advanced Practice Stroke Nurse [place] for Schedule & Pager information 07/04/2018 12:05 PM    ATTENDING NOTE: I reviewed above note and agree with the assessment and plan. I have made any additions or clarifications directly to the above note. Pt was seen and examined.   Son at bedside. No acute event overnight. Neuro stable. Still has oral deep secretions, needing NT suctioning PRN. Will add robinul for decreasing her secretions. On TF. UA negative for UTI, urine clear. No fever. BP stable on the high end. WBC trending down and close to normal. CMP unremarkable. Discussed with son and will d/c to SNF today.   Rosalin Hawking, MD PhD Stroke Neurology 07/04/2018 2:30 PM

## 2018-07-01 NOTE — Clinical Social Work Placement (Signed)
Nurse to call report to [phone], Room [facility]  NOTE  Date:  07/01/2018  Patient Details  Name: Dawn [name] MRN: [id number] Date of Birth: [birth year]  Clinical Social Work is seeking post-discharge placement for this patient at the [place] level of care (*CSW will initial, date and re-position this form in  chart as items are completed):  Yes   Patient/family provided with [facility] Work Department's list of facilities offering this level of care within the geographic area requested by the patient (or if unable, by the patient's family).  Yes   Patient/family informed of their freedom to choose among providers that offer the needed level of care, that participate in Medicare, Medicaid or managed care program needed by the patient, have an available bed and are willing to accept the patient.  Yes   Patient/family informed of [facility]'s ownership interest in [facility] and [facility], as well as of the fact that they are under no obligation to receive care at these facilities.  PASRR submitted to EDS on       PASRR number received on       Existing PASRR number confirmed on       FL2 transmitted to all facilities in geographic area requested by pt/family on       FL2 transmitted to all facilities within larger geographic area on       Patient informed that his/her managed care company has contracts with or will negotiate with certain facilities, including the following:        Yes   Patient/family informed of bed offers received.  Patient chooses bed at [facility]     Physician recommends and patient chooses bed at      Patient to be transferred to [facility] on 07/01/18.  Patient to be transferred to facility by [facility]     Patient family notified on 07/01/18 of transfer.  Name of family member notified:  Ronalee Belts     PHYSICIAN Please prepare priority  discharge summary, including medications, Please sign DNR     Additional Comment:    _______________________________________________ Geralynn Ochs, LCSW 07/01/2018, 3:25 PM

## 2018-07-01 NOTE — Progress Notes (Signed)
STROKE TEAM PROGRESS NOTE   SUBJECTIVE (INTERVAL HISTORY) Patient son is at bedside. Son reports axillary temperature of 99.1 yesterday. Understand UA and CXR unrevealing. blood cultures pending (no growth). Started on rocephin - has received 2 doses; will continue for total of 3 if she remains hospitalized. WBC trending down. Temperature normal today. Put back on home plood pressure - elevated 160-180s past 24h. Overall, looks good, up in the chair. Complains of being cold.  dtr requested laxative for BM (miralax last given 06/27/2018). Will stop IV robinul (started by palliative care for secretions at time of consult)  OBJECTIVE Temp:  [97.7 F (36.5 C)-98.6 F (37 C)] 97.7 F (36.5 C) (08/05 0447) Pulse Rate:  [84-94] 91 (08/05 0757) Cardiac Rhythm: Normal sinus rhythm (08/05 0704) Resp:  [16-21] 20 (08/05 0757) BP: (156-203)/(59-84) 162/84 (08/05 0757) SpO2:  [96 %-100 %] 97 % (08/05 0757) Weight:  [55.3 kg (121 lb 14.6 oz)] 55.3 kg (121 lb 14.6 oz) (08/05 0342)   Vitals:   07/01/18 0118 07/01/18 0342 07/01/18 0447 07/01/18 0757  BP: (!) 180/67  (!) 176/59 (!) 162/84  Pulse: 84  86 91  Resp: 18  16 20   Temp: 98.5 F (36.9 C)  97.7 F (36.5 C)   TempSrc: Oral  Oral   SpO2: 100%  98% 97%  Weight:  55.3 kg (121 lb 14.6 oz)    Height:         CBC:  Recent Labs  Lab 06/30/18 0612 07/01/18 0420  WBC 16.3* 14.1*  NEUTROABS  --  11.1*  HGB 10.3* 9.3*  HCT 32.1* 30.2*  MCV 88.9 90.1  PLT 325 229    Basic Metabolic Panel:  Recent Labs  Lab 06/24/18 1703 06/25/18 0412  06/30/18 0612 07/01/18 0420  NA  --  142   < > 137 139  K  --  2.9*   < > 3.9 4.4  CL  --  109   < > 103 105  CO2  --  22   < > 25 26  GLUCOSE  --  127*   < > 141* 124*  BUN  --  40*   < > 30* 32*  CREATININE  --  1.10*   < > 0.89 0.83  CALCIUM  --  8.7*   < > 8.4* 7.4*  MG 2.2 2.2  --   --   --   PHOS 4.3 3.6  --   --   --    < > = values in this interval not displayed.    Lipid Panel:      Component Value Date/Time   CHOL 103 06/22/2018 0151   TRIG 67 06/22/2018 0151   HDL 48 06/22/2018 0151   CHOLHDL 2.1 06/22/2018 0151   VLDL 13 06/22/2018 0151   LDLCALC 42 06/22/2018 0151   HgbA1c:  Lab Results  Component Value Date   HGBA1C 5.7 (H) 06/22/2018   Urine Drug Screen:     Component Value Date/Time   LABOPIA NONE DETECTED 01/22/2018 1700   COCAINSCRNUR NONE DETECTED 01/22/2018 1700   LABBENZ NONE DETECTED 01/22/2018 1700   AMPHETMU NONE DETECTED 01/22/2018 1700   THCU NONE DETECTED 01/22/2018 1700   LABBARB NONE DETECTED 01/22/2018 1700    Alcohol Level     Component Value Date/Time   ETH <10 01/22/2018 2013    IMAGING  Ct Angio Head W Or Wo Contrast  Result Date: 06/21/2018 CLINICAL DATA:  Stroke, follow-up. Acute onset of left-sided weakness and  facial droop with ice fixed to the right. EXAM: CT ANGIOGRAPHY HEAD AND NECK CT PERFUSION BRAIN TECHNIQUE: Multidetector CT imaging of the head and neck was performed using the standard protocol during bolus administration of intravenous contrast. Multiplanar CT image reconstructions and MIPs were obtained to evaluate the vascular anatomy. Carotid stenosis measurements (when applicable) are obtained utilizing NASCET criteria, using the distal internal carotid diameter as the denominator. Multiphase CT imaging of the brain was performed following IV bolus contrast injection. Subsequent parametric perfusion maps were calculated using RAPID software. CONTRAST:  130mL ISOVUE-370 IOPAMIDOL (ISOVUE-370) INJECTION 76% COMPARISON:  CT head without contrast from the same day. MRI brain and MRA circle-of-Willis 01/23/2018. FINDINGS: CTA NECK FINDINGS Aortic arch: A 3 vessel arch configuration is present. Extensive atherosclerotic calcifications are present without a significant stenosis of the great vessel origins. There is no aneurysm. Right carotid system: Atherosclerotic calcifications are present along the wall of the right common  carotid artery without a significant stenosis. There is calcified and noncalcified plaque through the right carotid bifurcation. There is no focal luminal stenosis relative to the more distal vessel. The cervical right ICA is within normal limits. Left carotid system: The left common carotid artery demonstrates mural calcifications without a significant luminal stenosis. Dense calcifications are present at the left carotid bifurcation. The lumen is narrowed to less than 1 mm. Additional distal calcifications are present in the proximal left ICA without a significant tandem stenosis. Vertebral arteries: The right vertebral artery originates from the subclavian artery without significant stenosis. There is some tortuosity and calcification of the cervical right ICA without a significant stenosis. The left vertebral artery is occluded below the C1-2 level. Skeleton: Multilevel degenerative changes are noted in the cervical spine. Degenerative anterolisthesis is present at C3-4 and to a greater extent at C4-5. There is chronic loss of disc space at C5-6 and C6-7. No focal lytic or blastic lesions are present. Alignment is otherwise anatomic. Other neck: The soft tissues the neck are otherwise unremarkable. There is no significant adenopathy. Thyroid is within normal limits. Salivary glands are unremarkable. Upper chest: Mild dependent atelectasis is present. The upper lung fields are otherwise clear without focal nodule, mass, or airspace disease. Ill-defined density in the left upper lobe is stable, likely remote scarring. Review of the MIP images confirms the above findings CTA HEAD FINDINGS Anterior circulation: Atherosclerotic calcifications are present through the cavernous internal carotid arteries bilaterally. There is no significant luminal stenosis relative to the ICA termini. The right A1 segment is dominant. There is mild narrowing of the proximal left M1 segment. The middle right M2 branch is occluded. There  is distal left MCA atherosclerotic disease without focal stenosis or occlusion. Significant attenuation of branch vessels in the distal right MCA territory. No significant collaterals are present. Posterior circulation: The left vertebral artery is reconstituted at C1. Dense calcification is present at the dural margin of the vertebral artery on the left. The distal vertebral artery is opacified. The right vertebral artery is within normal limits. PICA origin is high. Basilar artery is within normal limits. Both posterior cerebral arteries originate from the basilar tip. There is mild distal small vessel disease without a significant proximal stenosis or occlusion. Venous sinuses: The dural sinuses are patent. The right transverse sinus is dominant. The straight sinus and deep cerebral veins are intact. Cortical veins are unremarkable. Anatomic variants: None Delayed phase: Postcontrast images demonstrate early loss of gray-white differentiation in the anterior right frontal lobe. Review of the MIP images  confirms the above findings CT Brain Perfusion Findings: CBF (<30%) Volume: 25mL Perfusion (Tmax>6.0s) volume: 13mL Mismatch Volume: 3mL Infarction Location:Right frontal lobe IMPRESSION: 1. Acute infarct of the right frontal lobe. Core infarct volume is 91 mL. 2. Occluded right M2 branch at the MCA bifurcation without distal reconstitution or collateral vessels. This corresponds to the infarct territory 3. Mild narrowing of the left M1 segment as described. 4. Diffuse distal small vessel disease without other significant proximal stenosis or occlusion in the circle-of-Willis. 5. High-grade stenosis of the left internal carotid artery at the bifurcation. 6. Atherosclerotic changes involving the right carotid bifurcation without significant stenosis. 7. Extensive atherosclerotic disease at the aortic arch without significant stenosis. 8. 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 It is reconstituted at C1-2 level. 9.  Multilevel degenerative changes in the cervical spine These results were called by telephone at the time of interpretation on 06/21/2018 at 12:52 pm to Dr. Alexis Goodell , who verbally acknowledged these results. Electronically Signed   By: San Morelle M.D.   On: 06/21/2018 13:01   Ct Head Wo Contrast  Result Date: 06/28/2018 CLINICAL DATA:  Stroke follow-up EXAM: CT HEAD WITHOUT CONTRAST TECHNIQUE: Contiguous axial images were obtained from the base of the skull through the vertex without intravenous contrast. COMPARISON:  06/22/2018 FINDINGS: Brain: Continued evolution of right MCA territory large infarct. There is a suspected small focus of petechial hemorrhage measuring approximately 4 x 2 mm 3 mm of leftward midline shift, slightly greater than on the prior study. Size and configuration of the ventricles are unchanged. Old left basal ganglia small vessel infarct is unchanged. There is hypoattenuation of the periventricular white matter, most commonly indicating chronic ischemic microangiopathy. Vascular: Atherosclerotic calcification of the internal carotid arteries at the skull base. No abnormal hyperdensity of the major intracranial arteries or dural venous sinuses. Skull: The visualized skull base, calvarium and extracranial soft tissues are normal. Sinuses/Orbits: No fluid levels or advanced mucosal thickening of the visualized paranasal sinuses. No mastoid or middle ear effusion. The orbits are normal. IMPRESSION: 1. Continued evolution of right MCA infarct with progression of cytotoxic edema and minimally increased leftward midline shift. 2. Suspected 4 x 2 mm focus of petechial hemorrhage within the infarcted area. No hemorrhagic conversion. These results were called by telephone at the time of interpretation on 06/28/2018 at 8:18 pm to Dr. Rory Percy , who verbally acknowledged these results. Electronically Signed   By: Ulyses Jarred M.D.   On: 06/28/2018 20:18   Ct Head Wo Contrast  Result  Date: 06/22/2018 CLINICAL DATA:  Focal neuro deficit, less than 6 hours. TPA 24 hours ago. EXAM: CT HEAD WITHOUT CONTRAST TECHNIQUE: Contiguous axial images were obtained from the base of the skull through the vertex without intravenous contrast. COMPARISON:  CT head without contrast 06/21/2018. CTA head and neck and CT perfusion head 06/21/2018. FINDINGS: Brain: A large right MCA territory infarct corresponds to the area of core infarct on the CT perfusion exam. There is involvement of the right frontal operculum and insular cortex. Basal ganglia are seen. Right ACA territory is spared. There is local mass effect with effacement of the adjacent sulci. There is no midline shift. There is partial effacement of the right lateral ventricle. Left lateral ventricle is within normal limits. Brainstem and cerebellum are normal. There is no hemorrhage into the infarct. Vascular: Atherosclerotic calcifications are present within the cavernous internal carotid arteries bilaterally. There is no hyperdense vessel. Skull: Calvarium is intact. No focal lytic or blastic lesions are  present. Sinuses/Orbits: The paranasal sinuses and mastoid air cells are clear. Bilateral lens replacements are present. Globes and orbits are otherwise within normal limits. IMPRESSION: 1. Involving nonhemorrhagic right MCA territory infarct corresponds with the area predicted by CT perfusion. 2. No hemorrhage within the infarct. 3. Local mass effect without significant midline shift. 4. Generalized atrophy and white matter disease is otherwise stable. Electronically Signed   By: San Morelle M.D.   On: 06/22/2018 12:09   Ct Angio Neck W Or Wo Contrast  Result Date: 06/21/2018 CLINICAL DATA:  Stroke, follow-up. Acute onset of left-sided weakness and facial droop with ice fixed to the right. EXAM: CT ANGIOGRAPHY HEAD AND NECK CT PERFUSION BRAIN TECHNIQUE: Multidetector CT imaging of the head and neck was performed using the standard protocol  during bolus administration of intravenous contrast. Multiplanar CT image reconstructions and MIPs were obtained to evaluate the vascular anatomy. Carotid stenosis measurements (when applicable) are obtained utilizing NASCET criteria, using the distal internal carotid diameter as the denominator. Multiphase CT imaging of the brain was performed following IV bolus contrast injection. Subsequent parametric perfusion maps were calculated using RAPID software. CONTRAST:  157mL ISOVUE-370 IOPAMIDOL (ISOVUE-370) INJECTION 76% COMPARISON:  CT head without contrast from the same day. MRI brain and MRA circle-of-Willis 01/23/2018. FINDINGS: CTA NECK FINDINGS Aortic arch: A 3 vessel arch configuration is present. Extensive atherosclerotic calcifications are present without a significant stenosis of the great vessel origins. There is no aneurysm. Right carotid system: Atherosclerotic calcifications are present along the wall of the right common carotid artery without a significant stenosis. There is calcified and noncalcified plaque through the right carotid bifurcation. There is no focal luminal stenosis relative to the more distal vessel. The cervical right ICA is within normal limits. Left carotid system: The left common carotid artery demonstrates mural calcifications without a significant luminal stenosis. Dense calcifications are present at the left carotid bifurcation. The lumen is narrowed to less than 1 mm. Additional distal calcifications are present in the proximal left ICA without a significant tandem stenosis. Vertebral arteries: The right vertebral artery originates from the subclavian artery without significant stenosis. There is some tortuosity and calcification of the cervical right ICA without a significant stenosis. The left vertebral artery is occluded below the C1-2 level. Skeleton: Multilevel degenerative changes are noted in the cervical spine. Degenerative anterolisthesis is present at C3-4 and to a  greater extent at C4-5. There is chronic loss of disc space at C5-6 and C6-7. No focal lytic or blastic lesions are present. Alignment is otherwise anatomic. Other neck: The soft tissues the neck are otherwise unremarkable. There is no significant adenopathy. Thyroid is within normal limits. Salivary glands are unremarkable. Upper chest: Mild dependent atelectasis is present. The upper lung fields are otherwise clear without focal nodule, mass, or airspace disease. Ill-defined density in the left upper lobe is stable, likely remote scarring. Review of the MIP images confirms the above findings CTA HEAD FINDINGS Anterior circulation: Atherosclerotic calcifications are present through the cavernous internal carotid arteries bilaterally. There is no significant luminal stenosis relative to the ICA termini. The right A1 segment is dominant. There is mild narrowing of the proximal left M1 segment. The middle right M2 branch is occluded. There is distal left MCA atherosclerotic disease without focal stenosis or occlusion. Significant attenuation of branch vessels in the distal right MCA territory. No significant collaterals are present. Posterior circulation: The left vertebral artery is reconstituted at C1. Dense calcification is present at the dural margin of the  vertebral artery on the left. The distal vertebral artery is opacified. The right vertebral artery is within normal limits. PICA origin is high. Basilar artery is within normal limits. Both posterior cerebral arteries originate from the basilar tip. There is mild distal small vessel disease without a significant proximal stenosis or occlusion. Venous sinuses: The dural sinuses are patent. The right transverse sinus is dominant. The straight sinus and deep cerebral veins are intact. Cortical veins are unremarkable. Anatomic variants: None Delayed phase: Postcontrast images demonstrate early loss of gray-white differentiation in the anterior right frontal lobe.  Review of the MIP images confirms the above findings CT Brain Perfusion Findings: CBF (<30%) Volume: 43mL Perfusion (Tmax>6.0s) volume: 173mL Mismatch Volume: 74mL Infarction Location:Right frontal lobe IMPRESSION: 1. Acute infarct of the right frontal lobe. Core infarct volume is 91 mL. 2. Occluded right M2 branch at the MCA bifurcation without distal reconstitution or collateral vessels. This corresponds to the infarct territory 3. Mild narrowing of the left M1 segment as described. 4. Diffuse distal small vessel disease without other significant proximal stenosis or occlusion in the circle-of-Willis. 5. High-grade stenosis of the left internal carotid artery at the bifurcation. 6. Atherosclerotic changes involving the right carotid bifurcation without significant stenosis. 7. Extensive atherosclerotic disease at the aortic arch without significant stenosis. 8. 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 It is reconstituted at C1-2 level. 9. Multilevel degenerative changes in the cervical spine These results were called by telephone at the time of interpretation on 06/21/2018 at 12:52 pm to Dr. Alexis Goodell , who verbally acknowledged these results. Electronically Signed   By: San Morelle M.D.   On: 06/21/2018 13:01   Ct Cerebral Perfusion W Contrast  Result Date: 06/21/2018 CLINICAL DATA:  Stroke, follow-up. Acute onset of left-sided weakness and facial droop with ice fixed to the right. EXAM: CT ANGIOGRAPHY HEAD AND NECK CT PERFUSION BRAIN TECHNIQUE: Multidetector CT imaging of the head and neck was performed using the standard protocol during bolus administration of intravenous contrast. Multiplanar CT image reconstructions and MIPs were obtained to evaluate the vascular anatomy. Carotid stenosis measurements (when applicable) are obtained utilizing NASCET criteria, using the distal internal carotid diameter as the denominator. Multiphase CT imaging of the brain was performed following IV bolus contrast  injection. Subsequent parametric perfusion maps were calculated using RAPID software. CONTRAST:  122mL ISOVUE-370 IOPAMIDOL (ISOVUE-370) INJECTION 76% COMPARISON:  CT head without contrast from the same day. MRI brain and MRA circle-of-Willis 01/23/2018. FINDINGS: CTA NECK FINDINGS Aortic arch: A 3 vessel arch configuration is present. Extensive atherosclerotic calcifications are present without a significant stenosis of the great vessel origins. There is no aneurysm. Right carotid system: Atherosclerotic calcifications are present along the wall of the right common carotid artery without a significant stenosis. There is calcified and noncalcified plaque through the right carotid bifurcation. There is no focal luminal stenosis relative to the more distal vessel. The cervical right ICA is within normal limits. Left carotid system: The left common carotid artery demonstrates mural calcifications without a significant luminal stenosis. Dense calcifications are present at the left carotid bifurcation. The lumen is narrowed to less than 1 mm. Additional distal calcifications are present in the proximal left ICA without a significant tandem stenosis. Vertebral arteries: The right vertebral artery originates from the subclavian artery without significant stenosis. There is some tortuosity and calcification of the cervical right ICA without a significant stenosis. The left vertebral artery is occluded below the C1-2 level. Skeleton: Multilevel degenerative changes are noted in the cervical  spine. Degenerative anterolisthesis is present at C3-4 and to a greater extent at C4-5. There is chronic loss of disc space at C5-6 and C6-7. No focal lytic or blastic lesions are present. Alignment is otherwise anatomic. Other neck: The soft tissues the neck are otherwise unremarkable. There is no significant adenopathy. Thyroid is within normal limits. Salivary glands are unremarkable. Upper chest: Mild dependent atelectasis is present.  The upper lung fields are otherwise clear without focal nodule, mass, or airspace disease. Ill-defined density in the left upper lobe is stable, likely remote scarring. Review of the MIP images confirms the above findings CTA HEAD FINDINGS Anterior circulation: Atherosclerotic calcifications are present through the cavernous internal carotid arteries bilaterally. There is no significant luminal stenosis relative to the ICA termini. The right A1 segment is dominant. There is mild narrowing of the proximal left M1 segment. The middle right M2 branch is occluded. There is distal left MCA atherosclerotic disease without focal stenosis or occlusion. Significant attenuation of branch vessels in the distal right MCA territory. No significant collaterals are present. Posterior circulation: The left vertebral artery is reconstituted at C1. Dense calcification is present at the dural margin of the vertebral artery on the left. The distal vertebral artery is opacified. The right vertebral artery is within normal limits. PICA origin is high. Basilar artery is within normal limits. Both posterior cerebral arteries originate from the basilar tip. There is mild distal small vessel disease without a significant proximal stenosis or occlusion. Venous sinuses: The dural sinuses are patent. The right transverse sinus is dominant. The straight sinus and deep cerebral veins are intact. Cortical veins are unremarkable. Anatomic variants: None Delayed phase: Postcontrast images demonstrate early loss of gray-white differentiation in the anterior right frontal lobe. Review of the MIP images confirms the above findings CT Brain Perfusion Findings: CBF (<30%) Volume: 9mL Perfusion (Tmax>6.0s) volume: 162mL Mismatch Volume: 42mL Infarction Location:Right frontal lobe IMPRESSION: 1. Acute infarct of the right frontal lobe. Core infarct volume is 91 mL. 2. Occluded right M2 branch at the MCA bifurcation without distal reconstitution or  collateral vessels. This corresponds to the infarct territory 3. Mild narrowing of the left M1 segment as described. 4. Diffuse distal small vessel disease without other significant proximal stenosis or occlusion in the circle-of-Willis. 5. High-grade stenosis of the left internal carotid artery at the bifurcation. 6. Atherosclerotic changes involving the right carotid bifurcation without significant stenosis. 7. Extensive atherosclerotic disease at the aortic arch without significant stenosis. 8. 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 It is reconstituted at C1-2 level. 9. Multilevel degenerative changes in the cervical spine These results were called by telephone at the time of interpretation on 06/21/2018 at 12:52 pm to Dr. Alexis Goodell , who verbally acknowledged these results. Electronically Signed   By: San Morelle M.D.   On: 06/21/2018 13:01   Dg Chest Port 1 View  Result Date: 06/30/2018 CLINICAL DATA:  Fevers EXAM: PORTABLE CHEST 1 VIEW COMPARISON:  06/27/2018 FINDINGS: Feeding catheter has been removed in the interval. Cardiac shadow is within normal limits. Aortic calcifications are seen. Mild chronic interstitial changes are noted stable from the prior exam. No new focal infiltrate or sizable effusion is seen. IMPRESSION: Chronic changes without acute abnormality. Electronically Signed   By: Inez Catalina M.D.   On: 06/30/2018 09:33   Dg Chest Port 1 View  Result Date: 06/27/2018 CLINICAL DATA:  Leukocytosis. EXAM: PORTABLE CHEST 1 VIEW COMPARISON:  03/07/2018. FINDINGS: Normal sized heart. Tortuous aorta. Small right pleural effusion. Stable  mildly prominent interstitial markings. Thoracolumbar spine degenerative changes. Bilateral shoulder degenerative changes with superior migration of the right humeral head and bony remodeling compatible with a chronic large right rotator cuff tear. Feeding tube extending into the stomach. IMPRESSION: 1. Small right pleural effusion. 2. Stable chronic  interstitial lung disease. Electronically Signed   By: Claudie Revering M.D.   On: 06/27/2018 10:49   Ct Head Code Stroke Wo Contrast  Result Date: 06/21/2018 CLINICAL DATA:  Code stroke. Focal neuro deficit, less than 6 hours, stroke suspected. Patient found unresponsive 1 hour ago. Acute onset of left-sided weakness and facial droop with ice fixed to the right. EXAM: CT HEAD WITHOUT CONTRAST TECHNIQUE: Contiguous axial images were obtained from the base of the skull through the vertex without intravenous contrast. COMPARISON:  CT head without contrast 02/22/2018 FINDINGS: Brain: Atrophy and moderate diffuse white matter changes are again seen bilaterally. No acute cortical infarct, hemorrhage, or mass lesion is present. The basal ganglia are intact. Insular ribbon is normal bilaterally. No acute or focal right-sided infarct is present. The ventricles are proportionate to the degree of atrophy and stable. The brainstem and cerebellum are normal. Vascular: Atherosclerotic calcifications are present in the cavernous and precavernous internal carotid arteries. There is no hyperdense vessel. Skull: Calvarium is intact. No focal lytic or blastic lesions are present. Sinuses/Orbits: Paranasal sinuses and mastoid air cells are clear. Bilateral lens replacements are present. Globes and orbits are otherwise within normal limits. ASPECTS [facility] Stroke Program Early CT Score) - Ganglionic level infarction (caudate, lentiform nuclei, internal capsule, insula, M1-M3 cortex): 7/7 - Supraganglionic infarction (M4-M6 cortex): 3/3 Total score (0-10 with 10 being normal): 10/10 IMPRESSION: 1. No acute intracranial abnormality. 2. ASPECTS is 10/10 3. Moderate atrophy and white matter disease is stable bilaterally. These results were called by telephone at the time of interpretation on 06/21/2018 at 11:03 am to Dr. Harvest Dark , who verbally acknowledged these results. Electronically Signed   By: San Morelle M.D.   On:  06/21/2018 11:05   Transthoracic Echocardiogram  06/22/2018 Study Conclusions - Left ventricle: The cavity size was normal. Systolic function was vigorous. The estimated ejection fraction was in the range of 65% to 70%. Wall motion was normal; there were no regional wall motion abnormalities. Features are consistent with a pseudonormal left ventricular filling pattern, with concomitant abnormal relaxation and increased filling pressure (grade 2 diastolic dysfunction). Doppler parameters are consistent with high ventricular filling pressure. - Aortic valve: Poorly visualized. Severely calcified annulus. Trileaflet; moderately thickened, moderately calcified leaflets. - Mitral valve: Severely calcified annulus. Moderately thickened, mildly calcified leaflets anterior. There was mild to moderate regurgitation. - Atrial septum: There was increased thickness of the septum, consistent with lipomatous hypertrophy. Tricuspid valve: There was mild regurgitation. - Pulmonic valve: There was mild regurgitation.  PEG placement 06/28/2018 [facility])   PHYSICAL EXAM  General - cachectic, frail, well developed, in no distress   Cardiovascular - Regular rate and rhythm, not in A. fib.  Abdomen  - soft, non-distended. Bowel sounds present. PEG site dressing CD&I  Neuro - Knows name, place, situation (stroke), thinks it is 80 and she is 84 years old (though corrected to 85 with a smile)  Follows simple commands appropriately. Dysarthric. Can repeat. Right gaze preference, able to cross midline, PERRL, not blinking to visual threat on the left. Can count fingers. Left facial droop mild, tongue midline.  Left upper extremity 1/5 with pain summation, left lower extremity 0/5 proximal and 2/5 distally.  Right upper  and lower extremity 3/5. Diffuse ecchymoses improving BUEs, L>R. DTR 1+, no Babinski. Sensation intact. Coordination not cooperative and gait not tested.   ASSESSMENT/PLAN Ms. Meiling Hanks Johnsey is a  84 y.o. female with history of LLE PVD with stenting, hip surgery, TIA, cancer, and HTN presenting with left sided weakness. The patient received IV TPA at [facility] Friday AM 06/21/2018.  Stroke: Large right MCA infarct s/p IV tPA - embolic due to A. fib/aflutter not on AC.  Resultant  Left side hemiplegia, right gaze preference  CT head - No acute intracranial abnormality.   CTA H&N - Occluded right M2 branch. High-grade stenosis of the left internal carotid furcation  CT perfusion - large infarct core   MRI head - not performed due to "metal in body" as per family  2D Echo - EF 65% to 70%. No cardiac source of emboli identified.  LDL 42  HgbA1c - 5.7  VTE prophylaxis - subq heparin  aspirin 81 mg daily and clopidogrel 75 mg daily prior to admission, now on  aspirin 325mg .  Will discuss anticoagulation start as 7-10 days post stroke (around 06/29/18)   Ongoing aggressive stroke risk factor management  Therapy recommendations:  SNF   Disposition:  Pending.Medically ready for d/c. Await SNF bed located.  A. fib/a flutter not on AC  As per family, patient has diagnosed with A. fib in the past not on [facility] but on aspirin and Plavix  AF confirmed in ICU  On aspirin 325  On Metoprolol  Rate controlled  Will discuss anticoagulation start as 7-10 days post stroke (around 06/29/18)   Hyperlipidemia  LDL 42, goal LDL < 70   Lipitor 20 PTA, resumed in hospital  Continue statin at d/c  Hypertension  Stable on the high end, 160-180  Treated post tPA w/ cleviprex . SBP goal < 160  . Now on: Norvasc 10, clonidine 0.2 bid, metoprolol 25 mg bid (clonidine dose increased this am) . Monitor  . Long-term BP goal normotensive  Dysphagia  Did not pass swallow  PEG tube placed Friday 8/2 by Trauma   Speech on board  Mouth care  Other Stroke Risk Factors  Advanced age  Former cigarette smoker - quit  Other Active Problems  Leukocytosis, afebrile  -  8.6->12.2->13.5->16->14.1. Etiology unclear, ? R/t PEG placed 8/2. CXR NAD. UA without infection. Blood cx no growth thus far. Treated with Rocephin D2/3 (TM past 3 days 99.1 axillary)  Elevated creatinine, resolved 0.95  Hypokalemia, resolved - supplement 4.4  Dietitian consult 06/23/2018 - Severe malnutrition in context of chronic illness.  Palliative care consulted. Plans for full treatment  ? Constipation. No BM documented. Last miralax 8/1. RN to give laxative. Discussed options. Will stop Robinul for secretions, this may be contributing  Eyes - yellow drainage resolved. sleeps with eyes opened at home per son -continue Lacrilube at hs and artifical tears during the day (uses zaditar OTC eye drops prn at home)  Lethargy, Post PEG placement, resolved   Hospital day # [place], MSN, APRN, ANVP-BC, AGPCNP-BC Advanced Practice Stroke Nurse [place] for Schedule & Pager information 07/01/2018 12:52 PM   To contact Stroke Continuity provider, please refer to [email or web address] After hours, contact General Neurology

## 2018-07-02 LAB — URINALYSIS, COMPLETE (UACMP) WITH MICROSCOPIC
BACTERIA UA: NONE SEEN
BILIRUBIN URINE: NEGATIVE
Glucose, UA: 50 mg/dL — AB
Hgb urine dipstick: NEGATIVE
Ketones, ur: NEGATIVE mg/dL
Leukocytes, UA: NEGATIVE
Nitrite: NEGATIVE
PH: 7 (ref 5.0–8.0)
Protein, ur: 30 mg/dL — AB
Specific Gravity, Urine: 1.014 (ref 1.005–1.030)

## 2018-07-02 LAB — CBC
HEMATOCRIT: 29.5 % — AB (ref 36.0–46.0)
Hemoglobin: 9.2 g/dL — ABNORMAL LOW (ref 12.0–15.0)
MCH: 28.5 pg (ref 26.0–34.0)
MCHC: 31.2 g/dL (ref 30.0–36.0)
MCV: 91.3 fL (ref 78.0–100.0)
Platelets: 302 10*3/uL (ref 150–400)
RBC: 3.23 MIL/uL — ABNORMAL LOW (ref 3.87–5.11)
RDW: 16.2 % — ABNORMAL HIGH (ref 11.5–15.5)
WBC: 13.6 10*3/uL — ABNORMAL HIGH (ref 4.0–10.5)

## 2018-07-02 LAB — GLUCOSE, CAPILLARY
GLUCOSE-CAPILLARY: 129 mg/dL — AB (ref 70–99)
GLUCOSE-CAPILLARY: 135 mg/dL — AB (ref 70–99)
Glucose-Capillary: 114 mg/dL — ABNORMAL HIGH (ref 70–99)
Glucose-Capillary: 125 mg/dL — ABNORMAL HIGH (ref 70–99)
Glucose-Capillary: 134 mg/dL — ABNORMAL HIGH (ref 70–99)
Glucose-Capillary: 158 mg/dL — ABNORMAL HIGH (ref 70–99)

## 2018-07-02 LAB — BASIC METABOLIC PANEL
Anion gap: 10 (ref 5–15)
BUN: 38 mg/dL — AB (ref 8–23)
CALCIUM: 8.4 mg/dL — AB (ref 8.9–10.3)
CHLORIDE: 104 mmol/L (ref 98–111)
CO2: 25 mmol/L (ref 22–32)
CREATININE: 0.85 mg/dL (ref 0.44–1.00)
GFR calc non Af Amer: 60 mL/min — ABNORMAL LOW (ref >60)
GLUCOSE: 133 mg/dL — AB (ref 70–99)
Potassium: 3.9 mmol/L (ref 3.5–5.1)
Sodium: 139 mmol/L (ref 135–145)

## 2018-07-02 MED ORDER — SODIUM CHLORIDE 0.9% FLUSH
10.0000 mL | INTRAVENOUS | Status: DC | PRN
  Administered 2018-07-02: 30 mL

## 2018-07-02 MED ORDER — METOPROLOL TARTRATE 25 MG/10 ML ORAL SUSPENSION
50.0000 mg | Freq: Two times a day (BID) | ORAL | Status: DC
  Administered 2018-07-02 – 2018-07-04 (×5): 50 mg
  Filled 2018-07-02 (×6): qty 20

## 2018-07-02 MED ORDER — FREE WATER
200.0000 mL | Freq: Four times a day (QID) | Status: DC
  Administered 2018-07-02 – 2018-07-04 (×9): 200 mL

## 2018-07-02 NOTE — Progress Notes (Addendum)
STROKE TEAM PROGRESS NOTE   SUBJECTIVE (INTERVAL HISTORY) Her son and daughter are at the bedside.  Both are concerned with her own ongoing medical care.  They site her red face, her rectal temperature of 100 degrees, her dark urine and her elevated BUN/creatinine.  They are also concerned with her blood pressure greater than 180 this a.m. son states at a temperature of 99 and above his mother just does not do well and it shows she is starting to have problems.  Patient remains neurologically unchanged.  UA neg, CXR neg. She was discharged yesterday afternoon, but family not agreeable with plan.  Will monitor blood pressure and check labs. Anticipate d/c 8/7. Discussed medical condition at length with patient, daughter and son. As per typical, pt complains of being cold and is wrapped in multiple blankets.  OBJECTIVE Temp:  [97.5 F (36.4 C)-100 F (37.8 C)] 100 F (37.8 C) (08/06 1057) Pulse Rate:  [85-108] 85 (08/06 1057) Cardiac Rhythm: Normal sinus rhythm (08/06 0800) Resp:  [15-20] 20 (08/06 0818) BP: (129-183)/(54-65) 161/65 (08/06 1057) SpO2:  [90 %-100 %] 96 % (08/06 1057) Weight:  [55.1 kg (121 lb 7.6 oz)] 55.1 kg (121 lb 7.6 oz) (08/06 0245)   Vitals:   07/02/18 0347 07/02/18 0526 07/02/18 0818 07/02/18 1057  BP: 129/64  (!) 183/63 (!) 161/65  Pulse: 96  93 85  Resp: 16  20   Temp: (!) 97.5 F (36.4 C)  97.8 F (36.6 C) 100 F (37.8 C)  TempSrc: Oral  Axillary Rectal  SpO2: 94% 90% 98% 96%  Weight:      Height:         CBC:  Recent Labs  Lab 07/01/18 0420 07/02/18 1304  WBC 14.1* 13.6*  NEUTROABS 11.1*  --   HGB 9.3* 9.2*  HCT 30.2* 29.5*  MCV 90.1 91.3  PLT 339 440    Basic Metabolic Panel:  Recent Labs  Lab 07/01/18 0420 07/02/18 1304  NA 139 139  K 4.4 3.9  CL 105 104  CO2 26 25  GLUCOSE 124* 133*  BUN 32* 38*  CREATININE 0.83 0.85  CALCIUM 7.4* 8.4*    Lipid Panel:     Component Value Date/Time   CHOL 103 06/22/2018 0151   TRIG 67  06/22/2018 0151   HDL 48 06/22/2018 0151   CHOLHDL 2.1 06/22/2018 0151   VLDL 13 06/22/2018 0151   LDLCALC 42 06/22/2018 0151   HgbA1c:  Lab Results  Component Value Date   HGBA1C 5.7 (H) 06/22/2018   Urine Drug Screen:     Component Value Date/Time   LABOPIA NONE DETECTED 01/22/2018 1700   COCAINSCRNUR NONE DETECTED 01/22/2018 1700   LABBENZ NONE DETECTED 01/22/2018 1700   AMPHETMU NONE DETECTED 01/22/2018 1700   THCU NONE DETECTED 01/22/2018 1700   LABBARB NONE DETECTED 01/22/2018 1700    Alcohol Level     Component Value Date/Time   ETH <10 01/22/2018 2013    IMAGING  Ct Angio Head W Or Wo Contrast  Result Date: 06/21/2018 CLINICAL DATA:  Stroke, follow-up. Acute onset of left-sided weakness and facial droop with ice fixed to the right. EXAM: CT ANGIOGRAPHY HEAD AND NECK CT PERFUSION BRAIN TECHNIQUE: Multidetector CT imaging of the head and neck was performed using the standard protocol during bolus administration of intravenous contrast. Multiplanar CT image reconstructions and MIPs were obtained to evaluate the vascular anatomy. Carotid stenosis measurements (when applicable) are obtained utilizing NASCET criteria, using the distal internal carotid diameter as  the denominator. Multiphase CT imaging of the brain was performed following IV bolus contrast injection. Subsequent parametric perfusion maps were calculated using RAPID software. CONTRAST:  132m ISOVUE-370 IOPAMIDOL (ISOVUE-370) INJECTION 76% COMPARISON:  CT head without contrast from the same day. MRI brain and MRA circle-of-Willis 01/23/2018. FINDINGS: CTA NECK FINDINGS Aortic arch: A 3 vessel arch configuration is present. Extensive atherosclerotic calcifications are present without a significant stenosis of the great vessel origins. There is no aneurysm. Right carotid system: Atherosclerotic calcifications are present along the wall of the right common carotid artery without a significant stenosis. There is calcified  and noncalcified plaque through the right carotid bifurcation. There is no focal luminal stenosis relative to the more distal vessel. The cervical right ICA is within normal limits. Left carotid system: The left common carotid artery demonstrates mural calcifications without a significant luminal stenosis. Dense calcifications are present at the left carotid bifurcation. The lumen is narrowed to less than 1 mm. Additional distal calcifications are present in the proximal left ICA without a significant tandem stenosis. Vertebral arteries: The right vertebral artery originates from the subclavian artery without significant stenosis. There is some tortuosity and calcification of the cervical right ICA without a significant stenosis. The left vertebral artery is occluded below the C1-2 level. Skeleton: Multilevel degenerative changes are noted in the cervical spine. Degenerative anterolisthesis is present at C3-4 and to a greater extent at C4-5. There is chronic loss of disc space at C5-6 and C6-7. No focal lytic or blastic lesions are present. Alignment is otherwise anatomic. Other neck: The soft tissues the neck are otherwise unremarkable. There is no significant adenopathy. Thyroid is within normal limits. Salivary glands are unremarkable. Upper chest: Mild dependent atelectasis is present. The upper lung fields are otherwise clear without focal nodule, mass, or airspace disease. Ill-defined density in the left upper lobe is stable, likely remote scarring. Review of the MIP images confirms the above findings CTA HEAD FINDINGS Anterior circulation: Atherosclerotic calcifications are present through the cavernous internal carotid arteries bilaterally. There is no significant luminal stenosis relative to the ICA termini. The right A1 segment is dominant. There is mild narrowing of the proximal left M1 segment. The middle right M2 branch is occluded. There is distal left MCA atherosclerotic disease without focal stenosis  or occlusion. Significant attenuation of branch vessels in the distal right MCA territory. No significant collaterals are present. Posterior circulation: The left vertebral artery is reconstituted at C1. Dense calcification is present at the dural margin of the vertebral artery on the left. The distal vertebral artery is opacified. The right vertebral artery is within normal limits. PICA origin is high. Basilar artery is within normal limits. Both posterior cerebral arteries originate from the basilar tip. There is mild distal small vessel disease without a significant proximal stenosis or occlusion. Venous sinuses: The dural sinuses are patent. The right transverse sinus is dominant. The straight sinus and deep cerebral veins are intact. Cortical veins are unremarkable. Anatomic variants: None Delayed phase: Postcontrast images demonstrate early loss of gray-white differentiation in the anterior right frontal lobe. Review of the MIP images confirms the above findings CT Brain Perfusion Findings: CBF (<30%) Volume: 974mPerfusion (Tmax>6.0s) volume: 13651mismatch Volume: 22m56mfarction Location:Right frontal lobe IMPRESSION: 1. Acute infarct of the right frontal lobe. Core infarct volume is 91 mL. 2. Occluded right M2 branch at the MCA bifurcation without distal reconstitution or collateral vessels. This corresponds to the infarct territory 3. Mild narrowing of the left M1 segment as  described. 4. Diffuse distal small vessel disease without other significant proximal stenosis or occlusion in the circle-of-Willis. 5. High-grade stenosis of the left internal carotid artery at the bifurcation. 6. Atherosclerotic changes involving the right carotid bifurcation without significant stenosis. 7. Extensive atherosclerotic disease at the aortic arch without significant stenosis. 8. 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 It is reconstituted at C1-2 level. 9. Multilevel degenerative changes in the cervical spine These results  were called by telephone at the time of interpretation on 06/21/2018 at 12:52 pm to Dr. Alexis Goodell , who verbally acknowledged these results. Electronically Signed   By: San Morelle M.D.   On: 06/21/2018 13:01   Ct Head Wo Contrast  Result Date: 06/28/2018 CLINICAL DATA:  Stroke follow-up EXAM: CT HEAD WITHOUT CONTRAST TECHNIQUE: Contiguous axial images were obtained from the base of the skull through the vertex without intravenous contrast. COMPARISON:  06/22/2018 FINDINGS: Brain: Continued evolution of right MCA territory large infarct. There is a suspected small focus of petechial hemorrhage measuring approximately 4 x 2 mm 3 mm of leftward midline shift, slightly greater than on the prior study. Size and configuration of the ventricles are unchanged. Old left basal ganglia small vessel infarct is unchanged. There is hypoattenuation of the periventricular white matter, most commonly indicating chronic ischemic microangiopathy. Vascular: Atherosclerotic calcification of the internal carotid arteries at the skull base. No abnormal hyperdensity of the major intracranial arteries or dural venous sinuses. Skull: The visualized skull base, calvarium and extracranial soft tissues are normal. Sinuses/Orbits: No fluid levels or advanced mucosal thickening of the visualized paranasal sinuses. No mastoid or middle ear effusion. The orbits are normal. IMPRESSION: 1. Continued evolution of right MCA infarct with progression of cytotoxic edema and minimally increased leftward midline shift. 2. Suspected 4 x 2 mm focus of petechial hemorrhage within the infarcted area. No hemorrhagic conversion. These results were called by telephone at the time of interpretation on 06/28/2018 at 8:18 pm to Dr. Rory Percy , who verbally acknowledged these results. Electronically Signed   By: Ulyses Jarred M.D.   On: 06/28/2018 20:18   Ct Head Wo Contrast  Result Date: 06/22/2018 CLINICAL DATA:  Focal neuro deficit, less than 6  hours. TPA 24 hours ago. EXAM: CT HEAD WITHOUT CONTRAST TECHNIQUE: Contiguous axial images were obtained from the base of the skull through the vertex without intravenous contrast. COMPARISON:  CT head without contrast 06/21/2018. CTA head and neck and CT perfusion head 06/21/2018. FINDINGS: Brain: A large right MCA territory infarct corresponds to the area of core infarct on the CT perfusion exam. There is involvement of the right frontal operculum and insular cortex. Basal ganglia are seen. Right ACA territory is spared. There is local mass effect with effacement of the adjacent sulci. There is no midline shift. There is partial effacement of the right lateral ventricle. Left lateral ventricle is within normal limits. Brainstem and cerebellum are normal. There is no hemorrhage into the infarct. Vascular: Atherosclerotic calcifications are present within the cavernous internal carotid arteries bilaterally. There is no hyperdense vessel. Skull: Calvarium is intact. No focal lytic or blastic lesions are present. Sinuses/Orbits: The paranasal sinuses and mastoid air cells are clear. Bilateral lens replacements are present. Globes and orbits are otherwise within normal limits. IMPRESSION: 1. Involving nonhemorrhagic right MCA territory infarct corresponds with the area predicted by CT perfusion. 2. No hemorrhage within the infarct. 3. Local mass effect without significant midline shift. 4. Generalized atrophy and white matter disease is otherwise stable. Electronically Signed  By: San Morelle M.D.   On: 06/22/2018 12:09   Ct Angio Neck W Or Wo Contrast  Result Date: 06/21/2018 CLINICAL DATA:  Stroke, follow-up. Acute onset of left-sided weakness and facial droop with ice fixed to the right. EXAM: CT ANGIOGRAPHY HEAD AND NECK CT PERFUSION BRAIN TECHNIQUE: Multidetector CT imaging of the head and neck was performed using the standard protocol during bolus administration of intravenous contrast. Multiplanar  CT image reconstructions and MIPs were obtained to evaluate the vascular anatomy. Carotid stenosis measurements (when applicable) are obtained utilizing NASCET criteria, using the distal internal carotid diameter as the denominator. Multiphase CT imaging of the brain was performed following IV bolus contrast injection. Subsequent parametric perfusion maps were calculated using RAPID software. CONTRAST:  134m ISOVUE-370 IOPAMIDOL (ISOVUE-370) INJECTION 76% COMPARISON:  CT head without contrast from the same day. MRI brain and MRA circle-of-Willis 01/23/2018. FINDINGS: CTA NECK FINDINGS Aortic arch: A 3 vessel arch configuration is present. Extensive atherosclerotic calcifications are present without a significant stenosis of the great vessel origins. There is no aneurysm. Right carotid system: Atherosclerotic calcifications are present along the wall of the right common carotid artery without a significant stenosis. There is calcified and noncalcified plaque through the right carotid bifurcation. There is no focal luminal stenosis relative to the more distal vessel. The cervical right ICA is within normal limits. Left carotid system: The left common carotid artery demonstrates mural calcifications without a significant luminal stenosis. Dense calcifications are present at the left carotid bifurcation. The lumen is narrowed to less than 1 mm. Additional distal calcifications are present in the proximal left ICA without a significant tandem stenosis. Vertebral arteries: The right vertebral artery originates from the subclavian artery without significant stenosis. There is some tortuosity and calcification of the cervical right ICA without a significant stenosis. The left vertebral artery is occluded below the C1-2 level. Skeleton: Multilevel degenerative changes are noted in the cervical spine. Degenerative anterolisthesis is present at C3-4 and to a greater extent at C4-5. There is chronic loss of disc space at C5-6  and C6-7. No focal lytic or blastic lesions are present. Alignment is otherwise anatomic. Other neck: The soft tissues the neck are otherwise unremarkable. There is no significant adenopathy. Thyroid is within normal limits. Salivary glands are unremarkable. Upper chest: Mild dependent atelectasis is present. The upper lung fields are otherwise clear without focal nodule, mass, or airspace disease. Ill-defined density in the left upper lobe is stable, likely remote scarring. Review of the MIP images confirms the above findings CTA HEAD FINDINGS Anterior circulation: Atherosclerotic calcifications are present through the cavernous internal carotid arteries bilaterally. There is no significant luminal stenosis relative to the ICA termini. The right A1 segment is dominant. There is mild narrowing of the proximal left M1 segment. The middle right M2 branch is occluded. There is distal left MCA atherosclerotic disease without focal stenosis or occlusion. Significant attenuation of branch vessels in the distal right MCA territory. No significant collaterals are present. Posterior circulation: The left vertebral artery is reconstituted at C1. Dense calcification is present at the dural margin of the vertebral artery on the left. The distal vertebral artery is opacified. The right vertebral artery is within normal limits. PICA origin is high. Basilar artery is within normal limits. Both posterior cerebral arteries originate from the basilar tip. There is mild distal small vessel disease without a significant proximal stenosis or occlusion. Venous sinuses: The dural sinuses are patent. The right transverse sinus is dominant. The straight sinus  and deep cerebral veins are intact. Cortical veins are unremarkable. Anatomic variants: None Delayed phase: Postcontrast images demonstrate early loss of gray-white differentiation in the anterior right frontal lobe. Review of the MIP images confirms the above findings CT Brain  Perfusion Findings: CBF (<30%) Volume: 43m Perfusion (Tmax>6.0s) volume: 1390mMismatch Volume: 4515mnfarction Location:Right frontal lobe IMPRESSION: 1. Acute infarct of the right frontal lobe. Core infarct volume is 91 mL. 2. Occluded right M2 branch at the MCA bifurcation without distal reconstitution or collateral vessels. This corresponds to the infarct territory 3. Mild narrowing of the left M1 segment as described. 4. Diffuse distal small vessel disease without other significant proximal stenosis or occlusion in the circle-of-Willis. 5. High-grade stenosis of the left internal carotid artery at the bifurcation. 6. Atherosclerotic changes involving the right carotid bifurcation without significant stenosis. 7. Extensive atherosclerotic disease at the aortic arch without significant stenosis. 8. 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 It is reconstituted at C1-2 level. 9. Multilevel degenerative changes in the cervical spine These results were called by telephone at the time of interpretation on 06/21/2018 at 12:52 pm to Dr. LESAlexis Goodellwho verbally acknowledged these results. Electronically Signed   By: ChrSan MorelleD.   On: 06/21/2018 13:01   Ct Cerebral Perfusion W Contrast  Result Date: 06/21/2018 CLINICAL DATA:  Stroke, follow-up. Acute onset of left-sided weakness and facial droop with ice fixed to the right. EXAM: CT ANGIOGRAPHY HEAD AND NECK CT PERFUSION BRAIN TECHNIQUE: Multidetector CT imaging of the head and neck was performed using the standard protocol during bolus administration of intravenous contrast. Multiplanar CT image reconstructions and MIPs were obtained to evaluate the vascular anatomy. Carotid stenosis measurements (when applicable) are obtained utilizing NASCET criteria, using the distal internal carotid diameter as the denominator. Multiphase CT imaging of the brain was performed following IV bolus contrast injection. Subsequent parametric perfusion maps were calculated  using RAPID software. CONTRAST:  100m13mOVUE-370 IOPAMIDOL (ISOVUE-370) INJECTION 76% COMPARISON:  CT head without contrast from the same day. MRI brain and MRA circle-of-Willis 01/23/2018. FINDINGS: CTA NECK FINDINGS Aortic arch: A 3 vessel arch configuration is present. Extensive atherosclerotic calcifications are present without a significant stenosis of the great vessel origins. There is no aneurysm. Right carotid system: Atherosclerotic calcifications are present along the wall of the right common carotid artery without a significant stenosis. There is calcified and noncalcified plaque through the right carotid bifurcation. There is no focal luminal stenosis relative to the more distal vessel. The cervical right ICA is within normal limits. Left carotid system: The left common carotid artery demonstrates mural calcifications without a significant luminal stenosis. Dense calcifications are present at the left carotid bifurcation. The lumen is narrowed to less than 1 mm. Additional distal calcifications are present in the proximal left ICA without a significant tandem stenosis. Vertebral arteries: The right vertebral artery originates from the subclavian artery without significant stenosis. There is some tortuosity and calcification of the cervical right ICA without a significant stenosis. The left vertebral artery is occluded below the C1-2 level. Skeleton: Multilevel degenerative changes are noted in the cervical spine. Degenerative anterolisthesis is present at C3-4 and to a greater extent at C4-5. There is chronic loss of disc space at C5-6 and C6-7. No focal lytic or blastic lesions are present. Alignment is otherwise anatomic. Other neck: The soft tissues the neck are otherwise unremarkable. There is no significant adenopathy. Thyroid is within normal limits. Salivary glands are unremarkable. Upper chest: Mild dependent atelectasis is present.  The upper lung fields are otherwise clear without focal nodule,  mass, or airspace disease. Ill-defined density in the left upper lobe is stable, likely remote scarring. Review of the MIP images confirms the above findings CTA HEAD FINDINGS Anterior circulation: Atherosclerotic calcifications are present through the cavernous internal carotid arteries bilaterally. There is no significant luminal stenosis relative to the ICA termini. The right A1 segment is dominant. There is mild narrowing of the proximal left M1 segment. The middle right M2 branch is occluded. There is distal left MCA atherosclerotic disease without focal stenosis or occlusion. Significant attenuation of branch vessels in the distal right MCA territory. No significant collaterals are present. Posterior circulation: The left vertebral artery is reconstituted at C1. Dense calcification is present at the dural margin of the vertebral artery on the left. The distal vertebral artery is opacified. The right vertebral artery is within normal limits. PICA origin is high. Basilar artery is within normal limits. Both posterior cerebral arteries originate from the basilar tip. There is mild distal small vessel disease without a significant proximal stenosis or occlusion. Venous sinuses: The dural sinuses are patent. The right transverse sinus is dominant. The straight sinus and deep cerebral veins are intact. Cortical veins are unremarkable. Anatomic variants: None Delayed phase: Postcontrast images demonstrate early loss of gray-white differentiation in the anterior right frontal lobe. Review of the MIP images confirms the above findings CT Brain Perfusion Findings: CBF (<30%) Volume: 24m Perfusion (Tmax>6.0s) volume: 1366mMismatch Volume: 4577mnfarction Location:Right frontal lobe IMPRESSION: 1. Acute infarct of the right frontal lobe. Core infarct volume is 91 mL. 2. Occluded right M2 branch at the MCA bifurcation without distal reconstitution or collateral vessels. This corresponds to the infarct territory 3. Mild  narrowing of the left M1 segment as described. 4. Diffuse distal small vessel disease without other significant proximal stenosis or occlusion in the circle-of-Willis. 5. High-grade stenosis of the left internal carotid artery at the bifurcation. 6. Atherosclerotic changes involving the right carotid bifurcation without significant stenosis. 7. Extensive atherosclerotic disease at the aortic arch without significant stenosis. 8. 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 It is reconstituted at C1-2 level. 9. Multilevel degenerative changes in the cervical spine These results were called by telephone at the time of interpretation on 06/21/2018 at 12:52 pm to Dr. LESAlexis Goodellwho verbally acknowledged these results. Electronically Signed   By: ChrSan MorelleD.   On: 06/21/2018 13:01   Dg Chest Port 1 View  Result Date: 06/30/2018 CLINICAL DATA:  Fevers EXAM: PORTABLE CHEST 1 VIEW COMPARISON:  06/27/2018 FINDINGS: Feeding catheter has been removed in the interval. Cardiac shadow is within normal limits. Aortic calcifications are seen. Mild chronic interstitial changes are noted stable from the prior exam. No new focal infiltrate or sizable effusion is seen. IMPRESSION: Chronic changes without acute abnormality. Electronically Signed   By: MarInez CatalinaD.   On: 06/30/2018 09:33   Dg Chest Port 1 View  Result Date: 06/27/2018 CLINICAL DATA:  Leukocytosis. EXAM: PORTABLE CHEST 1 VIEW COMPARISON:  03/07/2018. FINDINGS: Normal sized heart. Tortuous aorta. Small right pleural effusion. Stable mildly prominent interstitial markings. Thoracolumbar spine degenerative changes. Bilateral shoulder degenerative changes with superior migration of the right humeral head and bony remodeling compatible with a chronic large right rotator cuff tear. Feeding tube extending into the stomach. IMPRESSION: 1. Small right pleural effusion. 2. Stable chronic interstitial lung disease. Electronically Signed   By: SteClaudie ReveringD.    On: 06/27/2018 10:49   Ct  Head Code Stroke Wo Contrast  Result Date: 06/21/2018 CLINICAL DATA:  Code stroke. Focal neuro deficit, less than 6 hours, stroke suspected. Patient found unresponsive 1 hour ago. Acute onset of left-sided weakness and facial droop with ice fixed to the right. EXAM: CT HEAD WITHOUT CONTRAST TECHNIQUE: Contiguous axial images were obtained from the base of the skull through the vertex without intravenous contrast. COMPARISON:  CT head without contrast 02/22/2018 FINDINGS: Brain: Atrophy and moderate diffuse white matter changes are again seen bilaterally. No acute cortical infarct, hemorrhage, or mass lesion is present. The basal ganglia are intact. Insular ribbon is normal bilaterally. No acute or focal right-sided infarct is present. The ventricles are proportionate to the degree of atrophy and stable. The brainstem and cerebellum are normal. Vascular: Atherosclerotic calcifications are present in the cavernous and precavernous internal carotid arteries. There is no hyperdense vessel. Skull: Calvarium is intact. No focal lytic or blastic lesions are present. Sinuses/Orbits: Paranasal sinuses and mastoid air cells are clear. Bilateral lens replacements are present. Globes and orbits are otherwise within normal limits. ASPECTS [facility] Stroke Program Early CT Score) - Ganglionic level infarction (caudate, lentiform nuclei, internal capsule, insula, M1-M3 cortex): 7/7 - Supraganglionic infarction (M4-M6 cortex): 3/3 Total score (0-10 with 10 being normal): 10/10 IMPRESSION: 1. No acute intracranial abnormality. 2. ASPECTS is 10/10 3. Moderate atrophy and white matter disease is stable bilaterally. These results were called by telephone at the time of interpretation on 06/21/2018 at 11:03 am to Dr. Harvest Dark , who verbally acknowledged these results. Electronically Signed   By: San Morelle M.D.   On: 06/21/2018 11:05   Transthoracic Echocardiogram  06/22/2018 Study  Conclusions - Left ventricle: The cavity size was normal. Systolic function was vigorous. The estimated ejection fraction was in the range of 65% to 70%. Wall motion was normal; there were no regional wall motion abnormalities. Features are consistent with a pseudonormal left ventricular filling pattern, with concomitant abnormal relaxation and increased filling pressure (grade 2 diastolic dysfunction). Doppler parameters are consistent with high ventricular filling pressure. - Aortic valve: Poorly visualized. Severely calcified annulus. Trileaflet; moderately thickened, moderately calcified leaflets. - Mitral valve: Severely calcified annulus. Moderately thickened, mildly calcified leaflets anterior. There was mild to moderate regurgitation. - Atrial septum: There was increased thickness of the septum, consistent with lipomatous hypertrophy. Tricuspid valve: There was mild regurgitation. - Pulmonic valve: There was mild regurgitation.  PEG placement 06/28/2018 [facility])   PHYSICAL EXAM  General - cachectic, frail, well developed, in no distress.    Cardiovascular - Regular rate and rhythm, not in A. fib.  Abdomen  - soft, non-distended. Bowel sounds present. PEG site dressing CD&I  Neuro - Knows name, place, situation (stroke).  Follows simple commands appropriately. Can show 2 fingers. Dysarthric. Can repeat. Right gaze preference, able to cross midline, PERRL, not blinking to visual threat on the left. Can count fingers. Left facial droop mild, tongue midline.  Left upper extremity 1/5 with pain summation, left lower extremity 0/5 proximal and 2/5 distally.  Right upper and lower extremity 3/5. Diffuse ecchymoses improving BUEs, L>R. DTR 1+, no Babinski. Sensation intact. Coordination not cooperative and gait not tested.   ASSESSMENT/PLAN Dawn [name] is a 84 y.o. female with history of LLE PVD with stenting, hip surgery, TIA, cancer, and HTN presenting with left sided weakness. The  patient received IV TPA at [facility] Friday AM 06/21/2018.  Stroke: Large right MCA infarct s/p IV tPA - embolic due to A. fib/aflutter  not on AC.  Resultant  Left side hemiplegia, right gaze preference  CT head - No acute intracranial abnormality.   CTA H&N - Occluded right M2 branch. High-grade stenosis of the left internal carotid furcation  CT perfusion - large infarct core   MRI head - not performed due to "metal in body" as per family  2D Echo - EF 65% to 70%. No cardiac source of emboli identified.  LDL 42  HgbA1c - 5.7  VTE prophylaxis - subq heparin  aspirin 81 mg daily and clopidogrel 75 mg daily prior to admission, now on  Eliquis 2.5 mg bid.  Ongoing aggressive stroke risk factor management  Therapy recommendations:  SNF   Disposition:  SNF. Discharged yesterday with family refusal. Will reattempt d/c 8/7. SNF bed available.   A. Fib / A. flutter  As per family, patient has diagnosed with A. fib in the past not on AC, but on aspirin and Plavix PTA  AF confirmed in ICU  Treated with aspirin 325 mg daily in hospital.    On Metoprolol  Rate controlled  Now on Eliquis 2.5 mg twice daily for secondary stroke prevention.  Hyperlipidemia  LDL 42, goal LDL < 70   Lipitor 20 PTA, resumed in hospital  Continue statin at d/c  Hypertension  Stable, on BP elevated > 180 past 24h  Treated post tPA w/ cleviprex . SBP goal < 160  . Now on: Norvasc 10, clonidine 0.2 bid, metoprolol 50 mg bid (clonidine dose increased this am) . Monitor. Recheck BP if > 180. Discussed with RN. Marland Kitchen Long-term BP goal normotensive  Dysphagia  Did not pass swallow  PEG tube placed Friday 8/2 by Trauma   Speech on board  Mouth care  Other Stroke Risk Factors  Advanced age  Former cigarette smoker - quit  Other Active Problems  Leukocytosis, afebrile  - resolving - 16->14.1->13.6. Etiology unclear, ? R/t PEG placed 8/2. CXR NAD. UA without infection. Blood cx  no growth thus far. Treated with Rocephin D3/3 (TM past 24h 100 rectally). Explained to family that we do not consider this a temperature.   Elevated creatinine, resolved. 0.85  Hypokalemia, resolved - 3.9  Dietitian consult 06/23/2018 - Severe malnutrition in context of chronic illness. On tube feeds. Adjusted with IVF d/c'd  Palliative care consulted. Plans for full treatment  Bowels - concerned for Possible Constipation with no BM x 2 days post PEG. Pt did have PM following ducolax supp 8/7. This am with loose stool. Dawn [name] for secretions d/c'd 8/5.   Eyes - yellow drainage resolved. sleeps with eyes opened at home per son -continue Lacrilube at hs and artifical tears during the day (uses zaditar OTC eye drops prn at home)  Lethargy, Post PEG placement, resolved   BUN/CR stable. Dietician ensured adequate free water intake. Dark urine (clear amber) noted in Dawn [name] container.  Hospital day # [place], MSN, APRN, ANVP-BC, AGPCNP-BC Advanced Practice Stroke Nurse [place] for Schedule & Pager information 07/02/2018 3:37 PM   ATTENDING NOTE: I reviewed above note and agree with the assessment and plan. I have made any additions or clarifications directly to the above note. Pt was seen and examined.   Suppose to be discharged last night but did not happen. Pt no acute event overnight. Neuro stable, eyes open, asking for food from mouth, told her that we have to give feeding via PEG, not safe for her to swallow. Still has mucous in the  throat, not easily cleared with oral suctioning. Started eliquis last night for stroke prevention.   Met son and daughter at bedside. They had numerous questions for not letting her go to SNF yesterday. They concerning for fever, but there was no fever on her record. They asked for rectal temp, we did it and it showed 100F. They stated that 100F rectal for her was low grade fever. I told them there was no source of  infection so far, UA, CXR, Blood culture all negative. They said her urine color was dark this morning, I told them that pt is on 55cc TF with free water flush and 20cc IVF, with her body weight, she would not have any dehydration. Sometimes urine does have color from food and medication. We will do another in and out and get some urine sample for analysis. Daughter concerned about pt BP, it was one time high this am at 180. She is on medication and we just increased metoprolol dose, and one time high did not tell whole story. We can continue to monitor BP today. Son concerned about pt constipation, however, she had bowel movement yesterday after suppository and again today in am with small loose stool. They concerned about her face being red yesterday afternoon. I told them face red did not mean fever, pt may have agitation, anxiety, pain somewhere, it all cause face red but not fever. During the conversation, pt had no red face. Daughter concerns about pt BUN was high at 32. I told her BUN is influenced by protein intake. pt is on high protein TF, that was why BUN elevated. However, pt Cre was normal. We also did CBC and BMP today, it showed normal Cre. WBC trending down from 16.3->14.1->13.6. Will check again CBC CMP in am.    Rosalin Hawking, MD PhD Stroke Neurology 07/02/2018 6:19 PM  I spent  >35 minutes in total face-to-face time with the patient, more than 50% of which was spent in counseling and coordination of care, reviewing test results, images and medication, and address family concerns, pt treatment plan and potential prognosis. This patient's care requiresreview of multiple databases, neurological assessment, discussion with family, other specialists and medical decision making of high complexity.        To contact Stroke Continuity provider, please refer to [email or web address] After hours, contact General Neurology

## 2018-07-02 NOTE — Progress Notes (Signed)
CSW following for discharge plan. CSW received voicemail this morning from patient's son that they have chosen [facility]. CSW confirmed bed availability at [facility] for the patient. CSW contacted patient's son about discharge, and patient's son was not agreeable to discharge today. Patient's son discussed concerns about patient running a fever yesterday, and CSW assured patient's son that the fever had broken and her temperatures had been normal. Patient's son expressed concerns about high blood pressure, and CSW provided information about patient's vital signs throughout the day. Patient's son remained adamant that he was not wanting the patient to transition to [facility] tonight, and would be back tomorrow. Patient's son discussed how he knows he has a right to appeal a discharge if he wants to.   CSW to continue to follow.  Laveda Abbe, [place] Clinical Social Worker [phone]

## 2018-07-02 NOTE — Progress Notes (Signed)
Nutrition Brief Note  Received page from stroke team NP regarding free water for patient now that she is being transitioned off of IV fluids.  Ordered 235mL every 6 hours. With Osmolite 1.2 at 10mL/hr, provides 1871mL free water.  Dawn [name]. Dalten Ambrosino, MS, RD LDN Inpatient Clinical Dietitian Pager [phone]

## 2018-07-03 LAB — COMPREHENSIVE METABOLIC PANEL
ALBUMIN: 2.4 g/dL — AB (ref 3.5–5.0)
ALK PHOS: 75 U/L (ref 38–126)
ALT: 14 U/L (ref 0–44)
AST: 39 U/L (ref 15–41)
Anion gap: 11 (ref 5–15)
BUN: 43 mg/dL — ABNORMAL HIGH (ref 8–23)
CALCIUM: 8.4 mg/dL — AB (ref 8.9–10.3)
CO2: 26 mmol/L (ref 22–32)
CREATININE: 0.93 mg/dL (ref 0.44–1.00)
Chloride: 98 mmol/L (ref 98–111)
GFR calc Af Amer: >60 mL/min (ref >60)
GFR calc non Af Amer: 54 mL/min — ABNORMAL LOW (ref >60)
Glucose, Bld: 126 mg/dL — ABNORMAL HIGH (ref 70–99)
Potassium: 3.8 mmol/L (ref 3.5–5.1)
SODIUM: 135 mmol/L (ref 135–145)
Total Bilirubin: 0.4 mg/dL (ref 0.3–1.2)
Total Protein: 5.9 g/dL — ABNORMAL LOW (ref 6.5–8.1)

## 2018-07-03 LAB — GLUCOSE, CAPILLARY
Glucose-Capillary: 116 mg/dL — ABNORMAL HIGH (ref 70–99)
Glucose-Capillary: 120 mg/dL — ABNORMAL HIGH (ref 70–99)
Glucose-Capillary: 135 mg/dL — ABNORMAL HIGH (ref 70–99)
Glucose-Capillary: 146 mg/dL — ABNORMAL HIGH (ref 70–99)
Glucose-Capillary: 167 mg/dL — ABNORMAL HIGH (ref 70–99)

## 2018-07-03 LAB — CBC
HCT: 29.8 % — ABNORMAL LOW (ref 36.0–46.0)
HEMOGLOBIN: 9 g/dL — AB (ref 12.0–15.0)
MCH: 27.5 pg (ref 26.0–34.0)
MCHC: 30.2 g/dL (ref 30.0–36.0)
MCV: 91.1 fL (ref 78.0–100.0)
Platelets: 297 10*3/uL (ref 150–400)
RBC: 3.27 MIL/uL — AB (ref 3.87–5.11)
RDW: 16.1 % — ABNORMAL HIGH (ref 11.5–15.5)
WBC: 10.6 10*3/uL — ABNORMAL HIGH (ref 4.0–10.5)

## 2018-07-03 LAB — OSMOLALITY, URINE: OSMOLALITY UR: 496 mosm/kg (ref 300–900)

## 2018-07-03 MED ORDER — METOPROLOL TARTRATE 25 MG/10 ML ORAL SUSPENSION: 50.0000 mg | Freq: Two times a day (BID) | ORAL | Status: AC

## 2018-07-03 MED ORDER — FREE WATER: 200.0000 mL | Freq: Four times a day (QID) | Status: AC

## 2018-07-03 NOTE — Progress Notes (Signed)
Physical Therapy Treatment Patient Details Name: Dawn [name] MRN: [id number] DOB: 07/18/1928 Today's Date: 07/03/2018    History of Present Illness 84 y.o. female with PMH: HTN, cancer, hip arthroplasty who presesnted to [facility] ER with sudden onset left-sided weakness. CT Head positive for non-hemorrhagic right MCA territory infarct.    PT Comments    Patient seen for mobility progression. Pt continues to require +2 assist for bed mobility and OOB transfers and with L side hemiplegia. This session focused on sitting balance and functional transfers. Pt demonstrated improved tracking to midline but still with R side gaze preference. Son present throughout session. Pt is anxious about mobility and likes reassurance from son when transferring.  Continue to progress as tolerated with anticipated d/c to SNF for further skilled PT services.    Follow Up Recommendations  SNF;Supervision/Assistance - 24 hour     Equipment Recommendations  None recommended by PT    Recommendations for Other Services       Precautions / Restrictions Precautions Precautions: Fall Restrictions Weight Bearing Restrictions: No    Mobility  Bed Mobility Overal bed mobility: Needs Assistance Bed Mobility: Rolling;Supine to Sit Rolling: Max assist;+2 for physical assistance   Supine to sit: Max assist;+2 for physical assistance;HOB elevated     General bed mobility comments: multimodal cues for sequencing and hand over hand assist for use of rail with R UE; assistance to bring bilat LE/hips to EOB with bed pad and then to elevate trunk into sitting  Transfers Overall transfer level: Needs assistance Equipment used: (gait belt and bed pad) Transfers: Lateral/Scoot Transfers          Lateral/Scoot Transfers: Total assist;+2 physical assistance General transfer comment: cues for anterior translation of trunk and R hand placement; assistance with bed pad to scoot EOB to recliner    Ambulation/Gait             General Gait Details: unable to perform   Stairs             Wheelchair Mobility    Modified Rankin (Stroke Patients Only) Modified Rankin (Stroke Patients Only) Pre-Morbid Rankin Score: Moderately severe disability Modified Rankin: Severe disability     Balance Overall balance assessment: Needs assistance Sitting-balance support: Feet supported;Single extremity supported Sitting balance-Leahy Scale: Poor Sitting balance - Comments: L lateral lean and posterior bias; worked on sitting balance EOB with pt attempting to reach forward outside BOS                                     Cognition Arousal/Alertness: Awake/alert Behavior During Therapy: Flat affect;Anxious(fear of falling and likes reassurance from son ) Overall Cognitive Status: Impaired/Different from baseline Area of Impairment: Following commands;Awareness;Safety/judgement;Problem solving;Attention                   Current Attention Level: Sustained Memory: Decreased short-term memory Following Commands: Follows one step commands with increased time;Follows one step commands inconsistently Safety/Judgement: Decreased awareness of safety;Decreased awareness of deficits Awareness: Intellectual Problem Solving: Slow processing;Requires verbal cues;Requires tactile cues;Decreased initiation;Difficulty sequencing General Comments: R side gaze preference but with improved ability to track to midline this session      Exercises      General Comments General comments (skin integrity, edema, etc.): son present throughout session      Pertinent Vitals/Pain Pain Assessment: Faces Faces Pain Scale: Hurts little more Pain Location: buttocks and bilat  LE (below knees) with tactile input Pain Descriptors / Indicators: Guarding;Sore Pain Intervention(s): Limited activity within patient's tolerance;Monitored during session;Repositioned(pillows added to seat  of chair for comfort )    Home Living                      Prior Function            PT Goals (current goals can now be found in the care plan section) Acute Rehab PT Goals PT Goal Formulation: With family Time For Goal Achievement: 07/07/18 Potential to Achieve Goals: Fair Progress towards PT goals: Progressing toward goals    Frequency    Min 3X/week      PT Plan Current plan remains appropriate    Co-evaluation PT/OT/SLP Co-Evaluation/Treatment: Yes            AM-PAC PT "6 Clicks" Daily Activity  Outcome Measure  Difficulty turning over in bed (including adjusting bedclothes, sheets and blankets)?: Unable Difficulty moving from lying on back to sitting on the side of the bed? : Unable Difficulty sitting down on and standing up from a chair with arms (e.g., wheelchair, bedside commode, etc,.)?: Unable Help needed moving to and from a bed to chair (including a wheelchair)?: A Lot Help needed walking in hospital room?: Total Help needed climbing 3-5 steps with a railing? : Total 6 Click Score: 7    End of Session Equipment Utilized During Treatment: Gait belt Activity Tolerance: Patient tolerated treatment well Patient left: with call bell/phone within reach;with family/visitor present;in chair Nurse Communication: Mobility status PT Visit Diagnosis: Other symptoms and signs involving the nervous system (R29.898);Hemiplegia and hemiparesis Hemiplegia - Right/Left: Left Hemiplegia - dominant/non-dominant: Non-dominant     Time: 3716-9678 PT Time Calculation (min) (ACUTE ONLY): 44 min  Charges:  $Therapeutic Activity: 23-37 mins                     Earney Navy, PTA Pager: [phone]     Darliss Cheney 07/03/2018, 11:19 AM

## 2018-07-03 NOTE — Progress Notes (Signed)
  Speech Language Pathology Treatment: Dysphagia  Patient Details Name: Olayinka Gathers MRN: [id number] DOB: [birth year] Today's Date: 07/03/2018 Time: 3428-7681 SLP Time Calculation (min) (ACUTE ONLY): 15 min  Assessment / Plan / Recommendation Clinical Impression  Targeted labial seal as pt has open mouth posture and has not been successful with RMT due to poor seal. Used max cues with a variety of methods to elicit labial seal. Success 3x with /ma/, dry spoon, blowing through straw. Could not achieve nasal respiration. Lack of secretion management still observed. Provided deep oral suction with return of thin foamy secretions. Son is vigilant with oral hygiene. Continue efforts. No progress this week.   HPI HPI: Patient is an 84 y.o. female with PMH: HTN, cancer, hip arthroplasty who presesnted to [facility] ER with sudden onset left-sided weakness while eating breakfast. At baseline, patient walks short distances with walker but is mostly wheelchair bound. She is able to feed herself but needs help with bathing and toileting. CT Head positive for non-hemorrhagic right MCA territory infarct.      SLP Plan  Continue with current plan of care       Recommendations  Diet recommendations: NPO                Plan: Continue with current plan of care       GO               [facility], MA CCC-SLP [phone]  Lynann Beaver 07/03/2018, 12:45 PM

## 2018-07-03 NOTE — Discharge Instructions (Signed)

## 2018-07-03 NOTE — Progress Notes (Signed)
Occupational Therapy Treatment Patient Details Name: Dawn [name] MRN: [id number] DOB: 09-Aug-1928 Today's Date: 07/03/2018    History of present illness 84 y.o. female with PMH: HTN, cancer, hip arthroplasty who presesnted to [facility] ER with sudden onset left-sided weakness. CT Head positive for non-hemorrhagic right MCA territory infarct.   OT comments  Pt's LUE remains at overall strength of 1/5 - trace movements. Pt following commands and able to participate in grooming activities with RUE once transferred (total A +2 to recliner). Pt improving with ability to track to midline and hold - when at rest returns to rigt gaze preference. OT educated Pt's son on PROM for LUE due to edema noted. OT will continue to follow acutely.    Follow Up Recommendations  SNF;Supervision/Assistance - 24 hour    Equipment Recommendations  None recommended by OT    Recommendations for Other Services      Precautions / Restrictions Precautions Precautions: Fall Restrictions Weight Bearing Restrictions: No       Mobility Bed Mobility Overal bed mobility: Needs Assistance Bed Mobility: Rolling;Supine to Sit Rolling: Max assist;+2 for physical assistance   Supine to sit: Max assist;+2 for physical assistance;HOB elevated     General bed mobility comments: multimodal cues for sequencing and hand over hand assist for use of rail with R UE; assistance to bring bilat LE/hips to EOB with bed pad and then to elevate trunk into sitting  Transfers Overall transfer level: Needs assistance Equipment used: (gait belt and bed pad) Transfers: Lateral/Scoot Transfers          Lateral/Scoot Transfers: Total assist;+2 physical assistance General transfer comment: cues for anterior translation of trunk and R hand placement; assistance with bed pad to scoot EOB to recliner     Balance Overall balance assessment: Needs assistance Sitting-balance support: Feet supported;Single extremity  supported Sitting balance-Leahy Scale: Poor Sitting balance - Comments: L lateral lean and posterior bias; worked on sitting balance EOB with pt attempting to reach forward outside BOS                                    ADL either performed or assessed with clinical judgement   ADL Overall ADL's : Needs assistance/impaired Eating/Feeding: NPO   Grooming: Wash/dry face;Minimal Research officer, political party: Total assistance   Toileting- Clothing Manipulation and Hygiene: Total assistance         General ADL Comments: improved communication and attention to left side of body - especially better tracking with eyes and willingness to remain at midline for conversation. Pt also attempting to write using whiteboard in room - illegible today     Vision   Vision Assessment?: Vision impaired- to be further tested in functional context Additional Comments: tracking to midline and able to hold for short periods of time   Perception     Praxis      Cognition Arousal/Alertness: Awake/alert Behavior During Therapy: Flat affect;Anxious(fear of falling and likes reassurance from son ) Overall Cognitive Status: Impaired/Different from baseline Area of Impairment: Following commands;Awareness;Safety/judgement;Problem solving;Attention                   Current Attention Level: Sustained Memory: Decreased short-term memory Following Commands: Follows one step commands with increased time;Follows one step commands inconsistently Safety/Judgement: Decreased awareness of safety;Decreased awareness of deficits  Awareness: Intellectual Problem Solving: Slow processing;Requires verbal cues;Requires tactile cues;Decreased initiation;Difficulty sequencing General Comments: R side gaze preference but with improved ability to track to midline this session        Exercises     Shoulder Instructions       General Comments son present  throughout session    Pertinent Vitals/ Pain       Pain Assessment: Faces Faces Pain Scale: Hurts little more Pain Location: buttocks and bilat LE (below knees) with tactile input Pain Descriptors / Indicators: Guarding;Sore Pain Intervention(s): Limited activity within patient's tolerance;Monitored during session;Repositioned;Other (comment)(pillows added to bottom of chair)  Home Living                                          Prior Functioning/Environment              Frequency  Min 2X/week        Progress Toward Goals  OT Goals(current goals can now be found in the care plan section)  Progress towards OT goals: Progressing toward goals  Acute Rehab OT Goals Patient Stated Goal: to get better OT Goal Formulation: With patient/family Time For Goal Achievement: 07/07/18 Potential to Achieve Goals: [place] Discharge plan remains appropriate    Co-evaluation    PT/OT/SLP Co-Evaluation/Treatment: Yes Reason for Co-Treatment: Complexity of the patient's impairments (multi-system involvement);For patient/therapist safety;To address functional/ADL transfers PT goals addressed during session: Mobility/safety with mobility;Balance;Strengthening/ROM OT goals addressed during session: ADL's and self-care;Strengthening/ROM      AM-PAC PT "6 Clicks" Daily Activity     Outcome Measure   Help from another person eating meals?: Total Help from another person taking care of personal grooming?: A Little Help from another person toileting, which includes using toliet, bedpan, or urinal?: A Lot Help from another person bathing (including washing, rinsing, drying)?: A Lot Help from another person to put on and taking off regular upper body clothing?: A Lot Help from another person to put on and taking off regular lower body clothing?: A Lot 6 Click Score: 12    End of Session Equipment Utilized During Treatment: Gait belt  OT Visit Diagnosis:  Unsteadiness on feet (R26.81);Other abnormalities of gait and mobility (R26.89);Muscle weakness (generalized) (M62.81);Other symptoms and signs involving the nervous system (R29.898);Hemiplegia and hemiparesis Hemiplegia - Right/Left: Left Hemiplegia - dominant/non-dominant: Non-Dominant Hemiplegia - caused by: Cerebral infarction   Activity Tolerance Patient tolerated treatment well   Patient Left in chair;with call bell/phone within reach;with family/visitor present   Nurse Communication Mobility status        Time: 6010-9323 OT Time Calculation (min): 44 min  Charges: OT General Charges $OT Visit: 1 Visit OT Treatments $Therapeutic Activity: 8-22 mins  Hulda Humphrey OTR/L [place] 07/03/2018, 12:07 PM

## 2018-07-03 NOTE — Progress Notes (Signed)
Called RRT to come assist with deep suctioning because RN has been doing oral suction. RRT said she would only be doing the same. RN will continue to do oral suction. Pt has copious amounts of secretions.

## 2018-07-03 NOTE — Clinical Social Work Placement (Signed)
Nurse to call report to Dawn [name]  NOTE  Date:  07/03/2018  Patient Details  Name: Dawn [name] MRN: [id number] Date of Birth: 12-31-1927  Clinical Social Work is seeking post-discharge placement for this patient at the [place] level of care (*CSW will initial, date and re-position this form in  chart as items are completed):  Yes   Patient/family provided with [facility] Work Department's list of facilities offering this level of care within the geographic area requested by the patient (or if unable, by the patient's family).  Yes   Patient/family informed of their freedom to choose among providers that offer the needed level of care, that participate in Medicare, Medicaid or managed care program needed by the patient, have an available bed and are willing to accept the patient.  Yes   Patient/family informed of [facility]'s ownership interest in [facility] and [facility], as well as of the fact that they are under no obligation to receive care at these facilities.  PASRR submitted to EDS on       PASRR number received on       Existing PASRR number confirmed on       FL2 transmitted to all facilities in geographic area requested by pt/family on       FL2 transmitted to all facilities within larger geographic area on       Patient informed that his/her managed care company has contracts with or will negotiate with certain facilities, including the following:        Yes   Patient/family informed of bed offers received.  Patient chooses bed at [facility]     Physician recommends and patient chooses bed at      Patient to be transferred to [facility] on 07/03/18.  Patient to be transferred to facility by [facility]     Patient family notified on 07/03/18 of transfer.  Name of family member notified:  Ronalee Belts     PHYSICIAN Please prepare priority discharge  summary, including medications, Please sign DNR     Additional Comment:    _______________________________________________ Geralynn Ochs, LCSW 07/03/2018, 4:07 PM

## 2018-07-03 NOTE — Progress Notes (Signed)
RT was called by RN to evaluate the patient and suction as necessary. When I arrived pt was sating 89% and I deep suctioned the back of her throat and got her to cough clearing some secretions. Her sats then went to 90%. The family member in the room mentioned some concern about her oxygen being at 90% I then placed a nasal cannula on the patient at 2L. When leaving the room the pt was sating 91% and the RN is aware.

## 2018-07-04 ENCOUNTER — Other Ambulatory Visit: Payer: Self-pay | Admitting: Neurology

## 2018-07-04 LAB — GLUCOSE, CAPILLARY
GLUCOSE-CAPILLARY: 133 mg/dL — AB (ref 70–99)
GLUCOSE-CAPILLARY: 140 mg/dL — AB (ref 70–99)
GLUCOSE-CAPILLARY: 164 mg/dL — AB (ref 70–99)

## 2018-07-04 MED ORDER — GLYCOPYRROLATE 1 MG PO TABS: 1.0000 mg | ORAL_TABLET | Freq: Three times a day (TID) | ORAL | Status: AC

## 2018-07-04 MED ORDER — GLYCOPYRROLATE 1 MG PO TABS
1.0000 mg | ORAL_TABLET | Freq: Three times a day (TID) | ORAL | Status: DC
  Filled 2018-07-04: qty 1

## 2018-07-04 NOTE — Progress Notes (Signed)
CSW received call from [facility] Admissions after CSW left for the day that the facility admissions nurse had left early for an appointment and wouldn't be returning, they would be unable to accept the patient today. There will be no admitting nurse able to set up the patient's medications at the facility. CSW contacted MD and patient's son to update them. Per Admissions at [facility], they will be able to admit the patient first thing tomorrow morning.  CSW to follow.  Laveda Abbe, [place] Clinical Social Worker [phone]

## 2018-07-04 NOTE — Progress Notes (Addendum)
Planned d/c 07/03/2018. Discharge summary dictated. D/c did not occur d/t issues with nursing home staffing.   Stroke Discharge Summary   Patient ID:      Dawn [name]                         MRN: [id number]                                                         DOB: [birth year]  Date of Admission: 06/21/2018 Date of Discharge: 07/03/2018  Attending Physician:  Rosalin Hawking, MD, Stroke MD Consultant(s):    Palliative Care Team, Trauma Team (PEG)  Patient's PCP:  Idelle Crouch, MD   DISCHARGE DIAGNOSIS:  Principal Problem:   Acute right arterial ischemic stroke, middle cerebral artery (MCA) ([place]) s/p IV tPA Active Problems:   Essential hypertension   Goals of care, counseling/discussion   Palliative care encounter   Excessive oral secretions   Hypokalemia   Paroxysmal atrial fibrillation ([place])   Oropharyngeal dysphagia   Atrial flutter ([place])   Leukocytosis   Constipation     Past Medical History:  Cancer ([place])  skin cancer  Family history of adverse reaction to anesthesia  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  Hypertension  Sciatica    Past Surgical History:  ABDOMINAL HYSTERECTOMY  APPENDECTOMY  CATARACT EXTRACTION, BILATERAL  CHOLECYSTECTOMY  ENDARTERECTOMY FEMORAL  Right  04/19/2017  Procedure: ENDARTERECTOMY COMMON FEMORAL ARTERY, PROFUNDA  FEMORIS ARTERY, and  SUPERFICIAL FEMORAL ARTERY;  Surgeon: Algernon Huxley, MD;  Location: [facility];  Service: Vascular;  Laterality: Right;  ENDARTERECTOMY FEMORAL  Left  11/14/2017  Procedure: ENDARTERECTOMY FEMORAL;  Surgeon: Algernon Huxley, MD;  Location: [facility];  Service: Vascular;  Laterality: Left;  ESOPHAGOGASTRODUODENOSCOPY (EGD) WITH PROPOFOL  N/A  06/28/2018  Procedure: ESOPHAGOGASTRODUODENOSCOPY (EGD) WITH PROPOFOL;  Surgeon: Judeth Horn, MD;  Location: [place];  Service: General;  Laterality: N/A;  HIP SURGERY  Bilateral  LOWER EXTREMITY ANGIOGRAM  Right   04/19/2017  Procedure: LOWER EXTREMITY ANGIOGRAM WITH SUPERFICIAL FEMORAL ARTERY STENT PLACEMENT;  Surgeon: Algernon Huxley, MD;  Location: [facility];  Service: Vascular;  Laterality: Right;  LOWER EXTREMITY ANGIOGRAPHY  Right  04/18/2017  Procedure: Lower Extremity Angiography;  Surgeon: Algernon Huxley, MD;  Location: [place] [facility];  Service: Cardiovascular;  Laterality: Right;  LOWER EXTREMITY ANGIOGRAPHY  Left  07/12/2017  Procedure: Lower Extremity Angiography;  Surgeon: Algernon Huxley, MD;  Location: [place] [facility];  Service: Cardiovascular;  Laterality: Left;  LOWER EXTREMITY ANGIOGRAPHY  Left  11/09/2017  Procedure: LOWER EXTREMITY ANGIOGRAPHY;  Surgeon: Algernon Huxley, MD;  Location: [place] [facility];  Service: Cardiovascular;  Laterality: Left;  LOWER EXTREMITY INTERVENTION  07/12/2017  Procedure: Lower Extremity Intervention;  Surgeon: Algernon Huxley, MD;  Location: [place] [facility];  Service: Cardiovascular;;  LOWER EXTREMITY INTERVENTION  11/09/2017  Procedure: LOWER EXTREMITY INTERVENTION;  Surgeon: Algernon Huxley, MD;  Location: [place] [facility];  Service: Cardiovascular;;  PEG PLACEMENT  N/A  06/28/2018  Procedure: PERCUTANEOUS ENDOSCOPIC GASTROSTOMY (PEG) PLACEMENT;  Surgeon: Judeth Horn, MD;  Location: [place];  Service: General;  Laterality: N/A;  RENAL ANGIOGRAPHY  N/A  06/21/2017  Procedure: Renal Angiography;  Surgeon: Algernon Huxley, MD;  Location: [place] [facility];  Service: Cardiovascular;  Laterality: N/A;  SHOULDER SURGERY  Bilateral  TOTAL HIP ARTHROPLASTY  Bilateral   Allergies as of 07/03/2018    Percocet [oxycodone-acetaminophen]  Itching   Percodan [oxycodone-aspirin]  Itching    Medication List   STOP taking these medications    acetaminophen 325 MG tablet Commonly known as:  TYLENOL Replaced by:  acetaminophen 160 MG/5ML solution  acidophilus Caps capsule  aspirin EC 81 MG tablet  B-12 5000 MCG Caps Replaced by:   Cyanocobalamin 2500 MCG Tabs  clopidogrel 75 MG tablet Commonly known as:  PLAVIX  doxycycline 100 MG tablet Commonly known as:  VIBRA-TABS  ferrous sulfate 325 (65 FE) MG tablet Replaced by:  ferrous sulfate 300 (60 Fe) MG/5ML syrup  furosemide 20 MG tablet Commonly known as:  LASIX  indomethacin 50 MG capsule Commonly known as:  INDOCIN  ipratropium-albuterol 0.5-2.5 (3) MG/3ML Soln Commonly known as:  DUONEB  ketotifen 0.025 % ophthalmic solution Commonly known as:  ZADITOR  LEG-GESIC PO  metoprolol succinate 25 MG 24 hr tablet commonly known as:  TOPROL-XL  ondansetron 4 MG disintegrating tablet Commonly known as:  ZOFRAN-ODT  pantoprazole 40 MG tablet Commonly known as:  PROTONIX Replaced by:  pantoprazole sodium 40 mg/20 mL Pack  predniSONE 10 MG tablet Commonly known as:  DELTASONE  senna-docusate 8.6-50 MG tablet Commonly known as:  Senokot-S  traMADol 50 MG tablet Commonly known as:  ULTRAM  Vitamin D3 400 units Caps     TAKE these medications    acetaminophen 160 MG/5ML solution Commonly known as:  TYLENOL Place 20.3 mLs (650 mg total) into feeding tube every 6 (six) hours as needed for moderate pain. Replaces:  acetaminophen 325 MG tablet  albuterol (2.5 MG/3ML) 0.083% nebulizer solution Commonly known as:  PROVENTIL Take 3 mLs (2.5 mg total) by nebulization every 6 (six) hours as needed for wheezing or shortness of breath.  amLODipine 10 MG tablet Commonly known as:  NORVASC Place 1 tablet (10 mg total) into feeding tube daily.  apixaban 2.5 MG Tabs tablet Commonly known as:  ELIQUIS Take 1 tablet (2.5 mg total) by mouth 2 (two) times daily.  artificial tears Oint ophthalmic ointment Commonly known as:  LACRILUBE Place into both eyes at bedtime.  atorvastatin 20 MG tablet commonly known as:  LIPITOR Place 1 tablet (20 mg total) into feeding tube daily at 6 PM.  bisacodyl 10 MG suppository Commonly known as:  DULCOLAX Place 1  suppository (10 mg total) rectally daily as needed for moderate constipation.  cloNIDine 0.2 MG tablet Commonly known as:  CATAPRES Place 1 tablet (0.2 mg total) into feeding tube 2 (two) times daily.  Cyanocobalamin 2500 MCG Tabs Place 5,000 mcg into feeding tube daily. Replaces:  B-12 5000 MCG Caps  feeding supplement (OSMOLITE 1.2 CAL) Liqd Place 1,000 mLs into feeding tube continuous.  feeding supplement (PRO-STAT SUGAR FREE 64) Liqd Place 30 mLs into feeding tube daily.  ferrous sulfate 300 (60 Fe) MG/5ML syrup Place 5 mLs (300 mg total) into feeding tube 2 (two) times daily with a meal. Replaces:  ferrous sulfate 325 (65 FE) MG tablet  free water Soln Place 200 mLs into feeding tube every 6 (six) hours.  metoprolol  tartrate 25 mg/10 mL Susp Commonly known as:  LOPRESSOR Place 20 mLs (50 mg total) into feeding tube 2 (two) times daily.  pantoprazole sodium 40 mg/20 mL Pack Commonly known as:  PROTONIX Place 20 mLs (40 mg total) into feeding tube daily. Replaces:  pantoprazole 40 MG tablet  polyvinyl alcohol 1.4 % ophthalmic solution Commonly known as:  LIQUIFILM TEARS Place 1 drop into both eyes every 4 (four) hours while awake.  LABORATORY STUDIES CBC Labs(Brief)          Component Value Date/Time   WBC 10.6 (H) 07/03/2018 0303   RBC 3.27 (L) 07/03/2018 0303   HGB 9.0 (L) 07/03/2018 0303   HCT 29.8 (L) 07/03/2018 0303   PLT 297 07/03/2018 0303   MCV 91.1 07/03/2018 0303   MCH 27.5 07/03/2018 0303   MCHC 30.2 07/03/2018 0303   RDW 16.1 (H) 07/03/2018 0303   LYMPHSABS 1.6 07/01/2018 0420   MONOABS 1.0 07/01/2018 0420   EOSABS 0.3 07/01/2018 0420   BASOSABS 0.1 07/01/2018 0420     CMP Labs(Brief)          Component Value Date/Time   NA 135 07/03/2018 0303   K 3.8 07/03/2018 0303   CL 98 07/03/2018 0303   CO2 26 07/03/2018 0303   GLUCOSE 126 (H) 07/03/2018 0303   BUN 43 (H) 07/03/2018 0303   CREATININE 0.93 07/03/2018 0303    CREATININE 0.92 03/13/2012 0805   CALCIUM 8.4 (L) 07/03/2018 0303   PROT 5.9 (L) 07/03/2018 0303   ALBUMIN 2.4 (L) 07/03/2018 0303   AST 39 07/03/2018 0303   ALT 14 07/03/2018 0303   ALKPHOS 75 07/03/2018 0303   BILITOT 0.4 07/03/2018 0303   GFRNONAA 54 (L) 07/03/2018 0303   GFRNONAA 58 (L) 03/13/2012 0805   GFRAA >60 07/03/2018 0303   GFRAA >60 03/13/2012 0805     COAGS RecentLabs       Lab Results  Component Value Date   INR 1.02 06/21/2018   INR 0.90 02/23/2018   INR 0.97 01/22/2018     Lipid Panel Labs(Brief)          Component Value Date/Time   CHOL 103 06/22/2018 0151   TRIG 67 06/22/2018 0151   HDL 48 06/22/2018 0151   CHOLHDL 2.1 06/22/2018 0151   VLDL 13 06/22/2018 0151   LDLCALC 42 06/22/2018 0151     HgbA1C  RecentLabs       Lab Results  Component Value Date   HGBA1C 5.7 (H) 06/22/2018     Urinalysis Labs(Brief)          Component Value Date/Time   COLORURINE YELLOW 07/02/2018 2335   APPEARANCEUR CLEAR 07/02/2018 2335   LABSPEC 1.014 07/02/2018 2335   PHURINE 7.0 07/02/2018 2335   GLUCOSEU 50 (A) 07/02/2018 2335   HGBUR NEGATIVE 07/02/2018 2335   BILIRUBINUR NEGATIVE 07/02/2018 2335   KETONESUR NEGATIVE 07/02/2018 2335   PROTEINUR 30 (A) 07/02/2018 2335   NITRITE NEGATIVE 07/02/2018 2335   LEUKOCYTESUR NEGATIVE 07/02/2018 2335     Osmolality, Ur 300 - 900 mOsm/kg 496    Urine Drug Screen  Labs(Brief)          Component Value Date/Time   LABOPIA NONE DETECTED 01/22/2018 1700   COCAINSCRNUR NONE DETECTED 01/22/2018 1700   LABBENZ NONE DETECTED 01/22/2018 1700   AMPHETMU NONE DETECTED 01/22/2018 1700   THCU NONE DETECTED 01/22/2018 1700   LABBARB NONE DETECTED 01/22/2018 1700      Alcohol Level Labs(Brief)  Component Value Date/Time   [facility] <10 01/22/2018 2013      SIGNIFICANT DIAGNOSTIC STUDIES  ImagingResults  Ct Angio Head W Or Wo Contrast  Result  Date: 06/21/2018 CLINICAL DATA:  Stroke, follow-up. Acute onset of left-sided weakness and facial droop with ice fixed to the right. EXAM: CT ANGIOGRAPHY HEAD AND NECK CT PERFUSION BRAIN TECHNIQUE: Multidetector CT imaging of the head and neck was performed using the standard protocol during bolus administration of intravenous contrast. Multiplanar CT image reconstructions and MIPs were obtained to evaluate the vascular anatomy. Carotid stenosis measurements (when applicable) are obtained utilizing NASCET criteria, using the distal internal carotid diameter as the denominator. Multiphase CT imaging of the brain was performed following IV bolus contrast injection. Subsequent parametric perfusion maps were calculated using RAPID software. CONTRAST:  131m ISOVUE-370 IOPAMIDOL (ISOVUE-370) INJECTION 76% COMPARISON:  CT head without contrast from the same day. MRI brain and MRA circle-of-[place]is 01/23/2018. FINDINGS: CTA NECK FINDINGS Aortic arch: A 3 vessel arch configuration is present. Extensive atherosclerotic calcifications are present without a significant stenosis of the great vessel origins. There is no aneurysm. Right carotid system: Atherosclerotic calcifications are present along the wall of the right common carotid artery without a significant stenosis. There is calcified and noncalcified plaque through the right carotid bifurcation. There is no focal luminal stenosis relative to the more distal vessel. The cervical right ICA is within normal limits. Left carotid system: The left common carotid artery demonstrates mural calcifications without a significant luminal stenosis. Dense calcifications are present at the left carotid bifurcation. The lumen is narrowed to less than 1 mm. Additional distal calcifications are present in the proximal left ICA without a significant tandem stenosis. Vertebral arteries: The right vertebral artery originates from the subclavian artery without significant stenosis. There is  some tortuosity and calcification of the cervical right ICA without a significant stenosis. The left vertebral artery is occluded below the C1-2 level. Skeleton: Multilevel degenerative changes are noted in the cervical spine. Degenerative anterolisthesis is present at C3-4 and to a greater extent at C4-5. There is chronic loss of disc space at C5-6 and C6-7. No focal lytic or blastic lesions are present. Alignment is otherwise anatomic. Other neck: The soft tissues the neck are otherwise unremarkable. There is no significant adenopathy. Thyroid is within normal limits. Salivary glands are unremarkable. Upper chest: Mild dependent atelectasis is present. The upper lung fields are otherwise clear without focal nodule, mass, or airspace disease. Ill-defined density in the left upper lobe is stable, likely remote scarring. Review of the MIP images confirms the above findings CTA HEAD FINDINGS Anterior circulation: Atherosclerotic calcifications are present through the cavernous internal carotid arteries bilaterally. There is no significant luminal stenosis relative to the ICA termini. The right A1 segment is dominant. There is mild narrowing of the proximal left M1 segment. The middle right M2 branch is occluded. There is distal left MCA atherosclerotic disease without focal stenosis or occlusion. Significant attenuation of branch vessels in the distal right MCA territory. No significant collaterals are present. Posterior circulation: The left vertebral artery is reconstituted at C1. Dense calcification is present at the dural margin of the vertebral artery on the left. The distal vertebral artery is opacified. The right vertebral artery is within normal limits. PICA origin is high. Basilar artery is within normal limits. Both posterior cerebral arteries originate from the basilar tip. There is mild distal small vessel disease without a significant proximal stenosis or occlusion. Venous sinuses: The dural sinuses are  patent.  The right transverse sinus is dominant. The straight sinus and deep cerebral veins are intact. Cortical veins are unremarkable. Anatomic variants: None Delayed phase: Postcontrast images demonstrate early loss of gray-white differentiation in the anterior right frontal lobe. Review of the MIP images confirms the above findings CT Brain Perfusion Findings: CBF (<30%) Volume: 42m Perfusion (Tmax>6.0s) volume: 1358mMismatch Volume: 4559mnfarction Location:Right frontal lobe IMPRESSION: 1. Acute infarct of the right frontal lobe. Core infarct volume is 91 mL. 2. Occluded right M2 branch at the MCA bifurcation without distal reconstitution or collateral vessels. This corresponds to the infarct territory 3. Mild narrowing of the left M1 segment as described. 4. Diffuse distal small vessel disease without other significant proximal stenosis or occlusion in the circle-of-Willis. 5. High-grade stenosis of the left internal carotid artery at the bifurcation. 6. Atherosclerotic changes involving the right carotid bifurcation without significant stenosis. 7. Extensive atherosclerotic disease at the aortic arch without significant stenosis. 8. 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 It is reconstituted at C1-2 level. 9. Multilevel degenerative changes in the cervical spine These results were called by telephone at the time of interpretation on 06/21/2018 at 12:52 pm to Dr. LESAlexis Goodellwho verbally acknowledged these results. Electronically Signed   By: ChrSan MorelleD.   On: 06/21/2018 13:01   Ct Head Wo Contrast  Result Date: 06/28/2018 CLINICAL DATA:  Stroke follow-up EXAM: CT HEAD WITHOUT CONTRAST TECHNIQUE: Contiguous axial images were obtained from the base of the skull through the vertex without intravenous contrast. COMPARISON:  06/22/2018 FINDINGS: Brain: Continued evolution of right MCA territory large infarct. There is a suspected small focus of petechial hemorrhage measuring approximately 4 x  2 mm 3 mm of leftward midline shift, slightly greater than on the prior study. Size and configuration of the ventricles are unchanged. Old left basal ganglia small vessel infarct is unchanged. There is hypoattenuation of the periventricular white matter, most commonly indicating chronic ischemic microangiopathy. Vascular: Atherosclerotic calcification of the internal carotid arteries at the skull base. No abnormal hyperdensity of the major intracranial arteries or dural venous sinuses. Skull: The visualized skull base, calvarium and extracranial soft tissues are normal. Sinuses/Orbits: No fluid levels or advanced mucosal thickening of the visualized paranasal sinuses. No mastoid or middle ear effusion. The orbits are normal. IMPRESSION: 1. Continued evolution of right MCA infarct with progression of cytotoxic edema and minimally increased leftward midline shift. 2. Suspected 4 x 2 mm focus of petechial hemorrhage within the infarcted area. No hemorrhagic conversion. These results were called by telephone at the time of interpretation on 06/28/2018 at 8:18 pm to Dr. AroRory Percywho verbally acknowledged these results. Electronically Signed   By: KevUlyses JarredD.   On: 06/28/2018 20:18   Ct Head Wo Contrast  Result Date: 06/22/2018 CLINICAL DATA:  Focal neuro deficit, less than 6 hours. TPA 24 hours ago. EXAM: CT HEAD WITHOUT CONTRAST TECHNIQUE: Contiguous axial images were obtained from the base of the skull through the vertex without intravenous contrast. COMPARISON:  CT head without contrast 06/21/2018. CTA head and neck and CT perfusion head 06/21/2018. FINDINGS: Brain: A large right MCA territory infarct corresponds to the area of core infarct on the CT perfusion exam. There is involvement of the right frontal operculum and insular cortex. Basal ganglia are seen. Right ACA territory is spared. There is local mass effect with effacement of the adjacent sulci. There is no midline shift. There is partial  effacement of the right lateral ventricle. Left lateral ventricle is  within normal limits. Brainstem and cerebellum are normal. There is no hemorrhage into the infarct. Vascular: Atherosclerotic calcifications are present within the cavernous internal carotid arteries bilaterally. There is no hyperdense vessel. Skull: Calvarium is intact. No focal lytic or blastic lesions are present. Sinuses/Orbits: The paranasal sinuses and mastoid air cells are clear. Bilateral lens replacements are present. Globes and orbits are otherwise within normal limits. IMPRESSION: 1. Involving nonhemorrhagic right MCA territory infarct corresponds with the area predicted by CT perfusion. 2. No hemorrhage within the infarct. 3. Local mass effect without significant midline shift. 4. Generalized atrophy and white matter disease is otherwise stable. Electronically Signed   By: San Morelle M.D.   On: 06/22/2018 12:09   Ct Angio Neck W Or Wo Contrast  Result Date: 06/21/2018 CLINICAL DATA:  Stroke, follow-up. Acute onset of left-sided weakness and facial droop with ice fixed to the right. EXAM: CT ANGIOGRAPHY HEAD AND NECK CT PERFUSION BRAIN TECHNIQUE: Multidetector CT imaging of the head and neck was performed using the standard protocol during bolus administration of intravenous contrast. Multiplanar CT image reconstructions and MIPs were obtained to evaluate the vascular anatomy. Carotid stenosis measurements (when applicable) are obtained utilizing NASCET criteria, using the distal internal carotid diameter as the denominator. Multiphase CT imaging of the brain was performed following IV bolus contrast injection. Subsequent parametric perfusion maps were calculated using RAPID software. CONTRAST:  180m ISOVUE-370 IOPAMIDOL (ISOVUE-370) INJECTION 76% COMPARISON:  CT head without contrast from the same day. MRI brain and MRA circle-of-Willis 01/23/2018. FINDINGS: CTA NECK FINDINGS Aortic arch: A 3 vessel arch configuration  is present. Extensive atherosclerotic calcifications are present without a significant stenosis of the great vessel origins. There is no aneurysm. Right carotid system: Atherosclerotic calcifications are present along the wall of the right common carotid artery without a significant stenosis. There is calcified and noncalcified plaque through the right carotid bifurcation. There is no focal luminal stenosis relative to the more distal vessel. The cervical right ICA is within normal limits. Left carotid system: The left common carotid artery demonstrates mural calcifications without a significant luminal stenosis. Dense calcifications are present at the left carotid bifurcation. The lumen is narrowed to less than 1 mm. Additional distal calcifications are present in the proximal left ICA without a significant tandem stenosis. Vertebral arteries: The right vertebral artery originates from the subclavian artery without significant stenosis. There is some tortuosity and calcification of the cervical right ICA without a significant stenosis. The left vertebral artery is occluded below the C1-2 level. Skeleton: Multilevel degenerative changes are noted in the cervical spine. Degenerative anterolisthesis is present at C3-4 and to a greater extent at C4-5. There is chronic loss of disc space at C5-6 and C6-7. No focal lytic or blastic lesions are present. Alignment is otherwise anatomic. Other neck: The soft tissues the neck are otherwise unremarkable. There is no significant adenopathy. Thyroid is within normal limits. Salivary glands are unremarkable. Upper chest: Mild dependent atelectasis is present. The upper lung fields are otherwise clear without focal nodule, mass, or airspace disease. Ill-defined density in the left upper lobe is stable, likely remote scarring. Review of the MIP images confirms the above findings CTA HEAD FINDINGS Anterior circulation: Atherosclerotic calcifications are present through the  cavernous internal carotid arteries bilaterally. There is no significant luminal stenosis relative to the ICA termini. The right A1 segment is dominant. There is mild narrowing of the proximal left M1 segment. The middle right M2 branch is occluded. There is distal left MCA  atherosclerotic disease without focal stenosis or occlusion. Significant attenuation of branch vessels in the distal right MCA territory. No significant collaterals are present. Posterior circulation: The left vertebral artery is reconstituted at C1. Dense calcification is present at the dural margin of the vertebral artery on the left. The distal vertebral artery is opacified. The right vertebral artery is within normal limits. PICA origin is high. Basilar artery is within normal limits. Both posterior cerebral arteries originate from the basilar tip. There is mild distal small vessel disease without a significant proximal stenosis or occlusion. Venous sinuses: The dural sinuses are patent. The right transverse sinus is dominant. The straight sinus and deep cerebral veins are intact. Cortical veins are unremarkable. Anatomic variants: None Delayed phase: Postcontrast images demonstrate early loss of gray-white differentiation in the anterior right frontal lobe. Review of the MIP images confirms the above findings CT Brain Perfusion Findings: CBF (<30%) Volume: 12m Perfusion (Tmax>6.0s) volume: 1361mMismatch Volume: [id number] Location:Right frontal lobe IMPRESSION: 1. Acute infarct of the right frontal lobe. Core infarct volume is 91 mL. 2. Occluded right M2 branch at the MCA bifurcation without distal reconstitution or collateral vessels. This corresponds to the infarct territory 3. Mild narrowing of the left M1 segment as described. 4. Diffuse distal small vessel disease without other significant proximal stenosis or occlusion in the circle-of-Willis. 5. High-grade stenosis of the left internal carotid artery at the bifurcation. 6.  Atherosclerotic changes involving the right carotid bifurcation without significant stenosis. 7. Extensive atherosclerotic disease at the aortic arch without significant stenosis. 8. 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 It is reconstituted at C1-2 level. 9. Multilevel degenerative changes in the cervical spine These results were called by telephone at the time of interpretation on 06/21/2018 at 12:52 pm to Dr. LESAlexis Goodellwho verbally acknowledged these results. Electronically Signed   By: ChrSan MorelleD.   On: 06/21/2018 13:01   Ct Cerebral Perfusion W Contrast  Result Date: 06/21/2018 CLINICAL DATA:  Stroke, follow-up. Acute onset of left-sided weakness and facial droop with ice fixed to the right. EXAM: CT ANGIOGRAPHY HEAD AND NECK CT PERFUSION BRAIN TECHNIQUE: Multidetector CT imaging of the head and neck was performed using the standard protocol during bolus administration of intravenous contrast. Multiplanar CT image reconstructions and MIPs were obtained to evaluate the vascular anatomy. Carotid stenosis measurements (when applicable) are obtained utilizing NASCET criteria, using the distal internal carotid diameter as the denominator. Multiphase CT imaging of the brain was performed following IV bolus contrast injection. Subsequent parametric perfusion maps were calculated using RAPID software. CONTRAST:  100m95mOVUE-370 IOPAMIDOL (ISOVUE-370) INJECTION 76% COMPARISON:  CT head without contrast from the same day. MRI brain and MRA circle-of-Willis 01/23/2018. FINDINGS: CTA NECK FINDINGS Aortic arch: A 3 vessel arch configuration is present. Extensive atherosclerotic calcifications are present without a significant stenosis of the great vessel origins. There is no aneurysm. Right carotid system: Atherosclerotic calcifications are present along the wall of the right common carotid artery without a significant stenosis. There is calcified and noncalcified plaque through the right carotid  bifurcation. There is no focal luminal stenosis relative to the more distal vessel. The cervical right ICA is within normal limits. Left carotid system: The left common carotid artery demonstrates mural calcifications without a significant luminal stenosis. Dense calcifications are present at the left carotid bifurcation. The lumen is narrowed to less than 1 mm. Additional distal calcifications are present in the proximal left ICA without a significant tandem stenosis. Vertebral arteries: The right vertebral  artery originates from the subclavian artery without significant stenosis. There is some tortuosity and calcification of the cervical right ICA without a significant stenosis. The left vertebral artery is occluded below the C1-2 level. Skeleton: Multilevel degenerative changes are noted in the cervical spine. Degenerative anterolisthesis is present at C3-4 and to a greater extent at C4-5. There is chronic loss of disc space at C5-6 and C6-7. No focal lytic or blastic lesions are present. Alignment is otherwise anatomic. Other neck: The soft tissues the neck are otherwise unremarkable. There is no significant adenopathy. Thyroid is within normal limits. Salivary glands are unremarkable. Upper chest: Mild dependent atelectasis is present. The upper lung fields are otherwise clear without focal nodule, mass, or airspace disease. Ill-defined density in the left upper lobe is stable, likely remote scarring. Review of the MIP images confirms the above findings CTA HEAD FINDINGS Anterior circulation: Atherosclerotic calcifications are present through the cavernous internal carotid arteries bilaterally. There is no significant luminal stenosis relative to the ICA termini. The right A1 segment is dominant. There is mild narrowing of the proximal left M1 segment. The middle right M2 branch is occluded. There is distal left MCA atherosclerotic disease without focal stenosis or occlusion. Significant attenuation of branch  vessels in the distal right MCA territory. No significant collaterals are present. Posterior circulation: The left vertebral artery is reconstituted at C1. Dense calcification is present at the dural margin of the vertebral artery on the left. The distal vertebral artery is opacified. The right vertebral artery is within normal limits. PICA origin is high. Basilar artery is within normal limits. Both posterior cerebral arteries originate from the basilar tip. There is mild distal small vessel disease without a significant proximal stenosis or occlusion. Venous sinuses: The dural sinuses are patent. The right transverse sinus is dominant. The straight sinus and deep cerebral veins are intact. Cortical veins are unremarkable. Anatomic variants: None Delayed phase: Postcontrast images demonstrate early loss of gray-white differentiation in the anterior right frontal lobe. Review of the MIP images confirms the above findings CT Brain Perfusion Findings: CBF (<30%) Volume: 69m Perfusion (Tmax>6.0s) volume: 1333mMismatch Volume: 4546mnfarction Location:Right frontal lobe IMPRESSION: 1. Acute infarct of the right frontal lobe. Core infarct volume is 91 mL. 2. Occluded right M2 branch at the MCA bifurcation without distal reconstitution or collateral vessels. This corresponds to the infarct territory 3. Mild narrowing of the left M1 segment as described. 4. Diffuse distal small vessel disease without other significant proximal stenosis or occlusion in the circle-of-Willis. 5. High-grade stenosis of the left internal carotid artery at the bifurcation. 6. Atherosclerotic changes involving the right carotid bifurcation without significant stenosis. 7. Extensive atherosclerotic disease at the aortic arch without significant stenosis. 8. 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 It is reconstituted at C1-2 level. 9. Multilevel degenerative changes in the cervical spine These results were called by telephone at the time of  interpretation on 06/21/2018 at 12:52 pm to Dr. LESAlexis Goodellwho verbally acknowledged these results. Electronically Signed   By: ChrSan MorelleD.   On: 06/21/2018 13:01   Dg Chest Port 1 View  Result Date: 06/30/2018 CLINICAL DATA:  Fevers EXAM: PORTABLE CHEST 1 VIEW COMPARISON:  06/27/2018 FINDINGS: Feeding catheter has been removed in the interval. Cardiac shadow is within normal limits. Aortic calcifications are seen. Mild chronic interstitial changes are noted stable from the prior exam. No new focal infiltrate or sizable effusion is seen. IMPRESSION: Chronic changes without acute abnormality. Electronically Signed   By:  Inez Catalina M.D.   On: 06/30/2018 09:33   Dg Chest Port 1 View  Result Date: 06/27/2018 CLINICAL DATA:  Leukocytosis. EXAM: PORTABLE CHEST 1 VIEW COMPARISON:  03/07/2018. FINDINGS: Normal sized heart. Tortuous aorta. Small right pleural effusion. Stable mildly prominent interstitial markings. Thoracolumbar spine degenerative changes. Bilateral shoulder degenerative changes with superior migration of the right humeral head and bony remodeling compatible with a chronic large right rotator cuff tear. Feeding tube extending into the stomach. IMPRESSION: 1. Small right pleural effusion. 2. Stable chronic interstitial lung disease. Electronically Signed   By: Claudie Revering M.D.   On: 06/27/2018 10:49   Ct Head Code Stroke Wo Contrast  Result Date: 06/21/2018 CLINICAL DATA:  Code stroke. Focal neuro deficit, less than 6 hours, stroke suspected. Patient found unresponsive 1 hour ago. Acute onset of left-sided weakness and facial droop with ice fixed to the right. EXAM: CT HEAD WITHOUT CONTRAST TECHNIQUE: Contiguous axial images were obtained from the base of the skull through the vertex without intravenous contrast. COMPARISON:  CT head without contrast 02/22/2018 FINDINGS: Brain: Atrophy and moderate diffuse white matter changes are again seen bilaterally. No acute  cortical infarct, hemorrhage, or mass lesion is present. The basal ganglia are intact. Insular ribbon is normal bilaterally. No acute or focal right-sided infarct is present. The ventricles are proportionate to the degree of atrophy and stable. The brainstem and cerebellum are normal. Vascular: Atherosclerotic calcifications are present in the cavernous and precavernous internal carotid arteries. There is no hyperdense vessel. Skull: Calvarium is intact. No focal lytic or blastic lesions are present. Sinuses/Orbits: Paranasal sinuses and mastoid air cells are clear. Bilateral lens replacements are present. Globes and orbits are otherwise within normal limits. ASPECTS [facility] Stroke Program Early CT Score) - Ganglionic level infarction (caudate, lentiform nuclei, internal capsule, insula, M1-M3 cortex): 7/7 - Supraganglionic infarction (M4-M6 cortex): 3/3 Total score (0-10 with 10 being normal): 10/10 IMPRESSION: 1. No acute intracranial abnormality. 2. ASPECTS is 10/10 3. Moderate atrophy and white matter disease is stable bilaterally. These results were called by telephone at the time of interpretation on 06/21/2018 at 11:03 am to Dr. Harvest Dark , who verbally acknowledged these results. Electronically Signed   By: San Morelle M.D.   On: 06/21/2018 11:05     Transthoracic Echocardiogram  06/22/2018 Study Conclusions - Left ventricle: The cavity size was normal. Systolic function was vigorous. The estimated ejection fraction was in the range of 65% to 70%. Wall motion was normal; there were no regional wall motion abnormalities. Features are consistent with a pseudonormal left ventricular filling pattern, with concomitant abnormal relaxation and increased filling pressure (grade 2 diastolic dysfunction). Doppler parameters are consistent with high ventricular filling pressure. - Aortic valve: Poorly visualized. Severely calcified annulus. Trileaflet; moderately thickened, moderately calcified  leaflets. - Mitral valve: Severely calcified annulus. Moderately thickened, mildly calcified leaflets anterior. There was mild to moderate regurgitation. - Atrial septum: There was increased thickness of the septum, consistent with lipomatous hypertrophy. Tricuspid valve: There was mild regurgitation. - Pulmonic valve: There was mild regurgitation.  PEG placement 06/28/2018 [facility]ulen Skains)   HISTORY OF PRESENT ILLNESS Dawn [name] is an 84 y.o. female with past medical history of hypertension, cancer, hip arthroplasty who presented to [facility] emergency room with sudden onset left-sided weakness while eating breakfast.  Her last known normal  She was LKW 10:15a 06/21/2018. Symptoms witnessed by family.  Patient had an initial NIH stroke scale of 18 and received IV TPA at [facility].  CT angiogram shows occluded right M2 MCA branch and CT perfusion showed large score of 91 mL, therefore not a candidate for mechanical thrombectomy.  Transferred to [facility] neuro ICU for further evaluation management. NIHSS: 18. Baseline MRS 4  Patient's baseline she walks with a walker for short distances but mostly wheelchair-bound.  Feeds herself.  Needs help taking baths, going to the bathroom.   HOSPITAL COURSE Ms. Akiera Hanks Meigs is a 84 y.o. female with history of LLE PVD with stenting, hip surgery, TIA, cancer, and HTN presenting with left sided weakness. The patient received IV TPA at [facility] Friday AM 06/21/2018. Transferred to [facility] for post stroke care. Infarct large with permanent neuro deficits including R hemiparesis and dysphagia requiring PEG. Palliative care met with family. They are agreeable to DNR but want full aggressive care. Plan for SNF at d/c. PEG placed 8/2 without difficulty, tube feedings resumed 8/3. Infarct was embolic secondary to known AF not on AC. Post PEG, started on Eliquis for secondary stroke prevention. BP medications adjusted in  hospital with normotensive goal. Treated leukocytosis prophylactically with Rocephin x 3 days (T 99, UA neg, CXR neg).   Stroke: Large right MCA infarct s/p IV tPA - embolic due to A. fib/aflutter not on AC. Marland KitchenResultant  Left side hemiplegia, right gaze preference .CT head - No acute intracranial abnormality.  .CTA H&N - Occluded right M2 branch. High-grade stenosis of the left internal carotid furcation .CT perfusion - large infarct core  .MRI head - not performed due to "metal in body" as per family .2D Echo - EF 65% to 70%. No cardiac source of emboli identified. Marland KitchenLDL 42 .HgbA1c - 5.7 .aspirin 81 mg daily and clopidogrel 75 mg daily prior to admission, treated with aspirin 331m daily and changed to eliquis 2.5 mg bid post PEG .Ongoing aggressive stroke risk factor management .Therapy recommendations:  SNF  .Disposition:  SNF   A. fib/a flutter not on AC .As per family, [facility] the [facility] and [facility]av[facility] .AF confirmed in ICU .On Metoprolol .Rate controlled .treated with aspirin 3284mdaily in hospital and changed to eliquis 2.5 mg bid post PEG  Hyperlipidemia .LDL 42, goal LDL < 70  .Lipitor 20 PTA, resumed in hospital .Continue statin at d/c  Hypertension .Stable on the high end, 160-180 .Treated post tPA w/ cleviprex .Now on: Norvasc 10, clonidine 0.2 bid, metoprolol 50 mg bid  .BP goal normotensive  Dysphagia .Did not pass swallow .PEG tube placed Friday 8/2 by Trauma  .Speech on board .Mouth care  Other Stroke Risk Factors .Advanced age .Former cigarette smoker - quit  Other Active Problems .Leukocytosis, afebrile  - resolving - 16->14.1->13.6->10.6. Etiology unclear, ? R/t PEG placed 8/2 vs aspiration. CXR NAD. UA without infection. Afebrile Blood cx no growth. Treated with Rocephin D3/3.  .EMarland Kitchenevated creatinine, resolved 0.93 .Hypokalemia, resolved - supplement 3.8 .Severe malnutrition in context of chronic  illness .Palliative care consulted. Plans for full treatment .Possible Constipation, resolved with dulcolax  .Eyes - yellow drainage resolved. sleeps with eyes opened at home per son -continue Lacrilube at hs and artifical tears during the day (uses zaditar OTC eye drops prn at home) .Lethargy, Post PEG placement, resolved    DISCHARGE EXAM Blood pressure (!) 172/60, pulse 81, temperature 98.3 F (36.8 C), temperature source Axillary, resp. rate 16, height 5' 7"  (1.702 m), weight 55.1 kg (121 lb 7.6 oz), SpO2 94 %. General -  cachectic, frail, well developed, in no distress.   Cardiovascular - Regular rate and rhythm, not in A. fib. Abdomen  - soft, non-distended. Bowel sounds present. PEG site dressing CD&I Neuro - Knows name, place, situation (stroke).  Follows simple commands appropriately. Can show 2 fingers. Dysarthric. Can repeat. Right gaze preference, able to cross midline, PERRL, not blinking to visual threat on the left. Can count fingers. Left facial droop mild, tongue midline. Left upper extremity 1/5 with pain summation, left lower extremity 0/5 proximal and 2/5 distally.  Right upper and lower extremity 3/5. Diffuse ecchymoses improving BUEs, L>R. DTR 1+, no Babinski. Sensation intact. Coordination not cooperative and gait not tested.  Discharge Diet   NPO, tube feedings via PEG   DISCHARGE PLAN .Disposition:  Discharge to skilled nursing facility for ongoing PT, OT and ST.  Marland KitchenEliquis (apixaban) daily for secondary stroke prevention. .Ongoing risk factor control by Primary Care Physician at time of discharge .Follow-up Idelle Crouch, MD in 2 weeks. .Follow-up in [facility] in 4 weeks, office to schedule an appointment.   40 minutes were spent preparing discharge.  Burnetta Sabin, MSN, APRN, ANVP-BC, AGPCNP-BC Advanced Practice Stroke Nurse [place] for Schedule & Pager information 07/03/2018 3:31 PM    ATTENDING  NOTE: I reviewed above note and agree with the assessment and plan. I have made any additions or clarifications directly to the above note. Pt was seen and examined.   No acute event overnight. Neuro stable. Still has oral deep secretions, will order NT suctioning PRN. On TF. UA negative for UTI, urine clear. No fever. BP stable on the high end. WBC trending down and close to normal. CMP unremarkable. Discussed with son at bedside. Will d/c to SNF today.  Rosalin Hawking, MD PhD Stroke Neurology 07/04/2018 4:02 PM

## 2018-07-04 NOTE — Final Progress Note (Signed)
NURSING PROGRESS NOTE  Dawn [name] [id number] Discharge Data: 07/04/2018 12:33 PM Attending Provider: Rosalin Hawking, MD HBZ:JIRCVE, Leonie Douglas, MD     Frazier Butt Bonnes to be D/C'd [place] per MD order.  All IV's discontinued with no bleeding noted. All belongings returned to patient. Report was called and given to Elmon Else, LPN at [facility]. PTAR has been set up for next available pick up.   Last Vital Signs:  Blood pressure (!) 166/68, pulse 84, temperature 99.4 F (37.4 C), temperature source Oral, resp. rate 20, height 5\' 7"  (1.702 m), weight 55.1 kg, SpO2 94 %.  Discharge Medication List Allergies as of 07/04/2018      Reactions   Percocet [oxycodone-acetaminophen] Itching   Percodan [oxycodone-aspirin] Itching      Medication List    STOP taking these medications   acetaminophen 325 MG tablet Commonly known as:  TYLENOL Replaced by:  acetaminophen 160 MG/5ML solution   acidophilus Caps capsule   aspirin EC 81 MG tablet   B-12 5000 MCG Caps Replaced by:  Cyanocobalamin 2500 MCG Tabs   clopidogrel 75 MG tablet Commonly known as:  PLAVIX   doxycycline 100 MG tablet Commonly known as:  VIBRA-TABS   ferrous sulfate 325 (65 FE) MG tablet Replaced by:  ferrous sulfate 300 (60 Fe) MG/5ML syrup   furosemide 20 MG tablet Commonly known as:  LASIX   indomethacin 50 MG capsule Commonly known as:  INDOCIN   ipratropium-albuterol 0.5-2.5 (3) MG/3ML Soln Commonly known as:  DUONEB   ketotifen 0.025 % ophthalmic solution Commonly known as:  ZADITOR   LEG-GESIC PO   metoprolol succinate 25 MG 24 hr tablet Commonly known as:  TOPROL-XL   ondansetron 4 MG disintegrating tablet Commonly known as:  ZOFRAN-ODT   pantoprazole 40 MG tablet Commonly known as:  PROTONIX Replaced by:  pantoprazole sodium 40 mg/20 mL Pack   predniSONE 10 MG tablet Commonly known as:  DELTASONE   senna-docusate 8.6-50 MG tablet Commonly known as:   Senokot-S   traMADol 50 MG tablet Commonly known as:  ULTRAM   Vitamin D3 400 units Caps     TAKE these medications   acetaminophen 160 MG/5ML solution Commonly known as:  TYLENOL Place 20.3 mLs (650 mg total) into feeding tube every 6 (six) hours as needed for moderate pain. Replaces:  acetaminophen 325 MG tablet   albuterol (2.5 MG/3ML) 0.083% nebulizer solution Commonly known as:  PROVENTIL Take 3 mLs (2.5 mg total) by nebulization every 6 (six) hours as needed for wheezing or shortness of breath.   amLODipine 10 MG tablet Commonly known as:  NORVASC Place 1 tablet (10 mg total) into feeding tube daily. What changed:  how to take this   apixaban 2.5 MG Tabs tablet Commonly known as:  ELIQUIS Take 1 tablet (2.5 mg total) by mouth 2 (two) times daily.   artificial tears Oint ophthalmic ointment Commonly known as:  LACRILUBE Place into both eyes at bedtime.   atorvastatin 20 MG tablet Commonly known as:  LIPITOR Place 1 tablet (20 mg total) into feeding tube daily at 6 PM. What changed:  See the new instructions.   bisacodyl 10 MG suppository Commonly known as:  DULCOLAX Place 1 suppository (10 mg total) rectally daily as needed for moderate constipation.   cloNIDine 0.2 MG tablet Commonly known as:  CATAPRES Place 1 tablet (0.2 mg total) into feeding tube 2 (two) times daily. What changed:    medication strength  how much to take  how to take this   Cyanocobalamin 2500 MCG Tabs Place 5,000 mcg into feeding tube daily. Replaces:  B-12 5000 MCG Caps   feeding supplement (OSMOLITE 1.2 CAL) Liqd Place 1,000 mLs into feeding tube continuous.   feeding supplement (PRO-STAT SUGAR FREE 64) Liqd Place 30 mLs into feeding tube daily.   ferrous sulfate 300 (60 Fe) MG/5ML syrup Place 5 mLs (300 mg total) into feeding tube 2 (two) times daily with a meal. Replaces:  ferrous sulfate 325 (65 FE) MG tablet   free water Soln Place 200 mLs into feeding tube every 6  (six) hours.   metoprolol tartrate 25 mg/10 mL Susp Commonly known as:  LOPRESSOR Place 20 mLs (50 mg total) into feeding tube 2 (two) times daily.   pantoprazole sodium 40 mg/20 mL Pack Commonly known as:  PROTONIX Place 20 mLs (40 mg total) into feeding tube daily. Replaces:  pantoprazole 40 MG tablet   polyvinyl alcohol 1.4 % ophthalmic solution Commonly known as:  LIQUIFILM TEARS Place 1 drop into both eyes every 4 (four) hours while awake.

## 2018-07-04 NOTE — Progress Notes (Signed)
Put O2 on the pt but she didn't keep it on. O2 stat was 86-88. Called RRT to come an evaluate and possible treatment. RR are 24.

## 2018-07-04 NOTE — Progress Notes (Signed)
Discharge to: [place] Anticipated discharge date: 07/04/18 Family notified: Yes, at bedside Transportation by: [facility]  Report #: [phone], Room [place] signing off.  Laveda Abbe LCSW [phone]

## 2018-07-04 NOTE — Progress Notes (Signed)
Report given to oncoming RN, Claiborne Billings resuming care.

## 2018-07-05 LAB — CULTURE, BLOOD (ROUTINE X 2)
Culture: NO GROWTH
Culture: NO GROWTH
Special Requests: ADEQUATE
Special Requests: ADEQUATE

## 2018-07-12 ENCOUNTER — Other Ambulatory Visit: Payer: Self-pay

## 2018-07-12 ENCOUNTER — Emergency Department: Payer: Medicare Other

## 2018-07-12 ENCOUNTER — Inpatient Hospital Stay
Admission: EM | Admit: 2018-07-12 | Discharge: 2018-07-28 | DRG: 871 | Disposition: E | Payer: Medicare Other | Source: Skilled Nursing Facility | Attending: Family Medicine | Admitting: Family Medicine

## 2018-07-12 DIAGNOSIS — J9601 Acute respiratory failure with hypoxia: Secondary | ICD-10-CM | POA: Diagnosis present

## 2018-07-12 DIAGNOSIS — Z85828 Personal history of other malignant neoplasm of skin: Secondary | ICD-10-CM | POA: Diagnosis not present

## 2018-07-12 DIAGNOSIS — I110 Hypertensive heart disease with heart failure: Secondary | ICD-10-CM | POA: Diagnosis present

## 2018-07-12 DIAGNOSIS — Z87891 Personal history of nicotine dependence: Secondary | ICD-10-CM | POA: Diagnosis not present

## 2018-07-12 DIAGNOSIS — I69354 Hemiplegia and hemiparesis following cerebral infarction affecting left non-dominant side: Secondary | ICD-10-CM

## 2018-07-12 DIAGNOSIS — Z66 Do not resuscitate: Secondary | ICD-10-CM | POA: Diagnosis present

## 2018-07-12 DIAGNOSIS — I739 Peripheral vascular disease, unspecified: Secondary | ICD-10-CM | POA: Diagnosis present

## 2018-07-12 DIAGNOSIS — E785 Hyperlipidemia, unspecified: Secondary | ICD-10-CM | POA: Diagnosis present

## 2018-07-12 DIAGNOSIS — Z8249 Family history of ischemic heart disease and other diseases of the circulatory system: Secondary | ICD-10-CM

## 2018-07-12 DIAGNOSIS — Y95 Nosocomial condition: Secondary | ICD-10-CM | POA: Diagnosis present

## 2018-07-12 DIAGNOSIS — I5032 Chronic diastolic (congestive) heart failure: Secondary | ICD-10-CM | POA: Diagnosis present

## 2018-07-12 DIAGNOSIS — Z96643 Presence of artificial hip joint, bilateral: Secondary | ICD-10-CM | POA: Diagnosis present

## 2018-07-12 DIAGNOSIS — Z9841 Cataract extraction status, right eye: Secondary | ICD-10-CM

## 2018-07-12 DIAGNOSIS — Z885 Allergy status to narcotic agent status: Secondary | ICD-10-CM

## 2018-07-12 DIAGNOSIS — D638 Anemia in other chronic diseases classified elsewhere: Secondary | ICD-10-CM | POA: Diagnosis present

## 2018-07-12 DIAGNOSIS — I480 Paroxysmal atrial fibrillation: Secondary | ICD-10-CM | POA: Diagnosis present

## 2018-07-12 DIAGNOSIS — R0603 Acute respiratory distress: Secondary | ICD-10-CM

## 2018-07-12 DIAGNOSIS — A419 Sepsis, unspecified organism: Principal | ICD-10-CM | POA: Diagnosis present

## 2018-07-12 DIAGNOSIS — J188 Other pneumonia, unspecified organism: Secondary | ICD-10-CM | POA: Diagnosis present

## 2018-07-12 DIAGNOSIS — Z9842 Cataract extraction status, left eye: Secondary | ICD-10-CM | POA: Diagnosis not present

## 2018-07-12 DIAGNOSIS — I69391 Dysphagia following cerebral infarction: Secondary | ICD-10-CM | POA: Diagnosis not present

## 2018-07-12 DIAGNOSIS — Z515 Encounter for palliative care: Secondary | ICD-10-CM | POA: Diagnosis not present

## 2018-07-12 DIAGNOSIS — N170 Acute kidney failure with tubular necrosis: Secondary | ICD-10-CM | POA: Diagnosis present

## 2018-07-12 DIAGNOSIS — Z833 Family history of diabetes mellitus: Secondary | ICD-10-CM

## 2018-07-12 DIAGNOSIS — E559 Vitamin D deficiency, unspecified: Secondary | ICD-10-CM | POA: Diagnosis present

## 2018-07-12 DIAGNOSIS — Z7901 Long term (current) use of anticoagulants: Secondary | ICD-10-CM | POA: Diagnosis not present

## 2018-07-12 DIAGNOSIS — R1312 Dysphagia, oropharyngeal phase: Secondary | ICD-10-CM | POA: Diagnosis present

## 2018-07-12 DIAGNOSIS — Z931 Gastrostomy status: Secondary | ICD-10-CM

## 2018-07-12 DIAGNOSIS — Z90710 Acquired absence of both cervix and uterus: Secondary | ICD-10-CM | POA: Diagnosis not present

## 2018-07-12 DIAGNOSIS — J189 Pneumonia, unspecified organism: Secondary | ICD-10-CM | POA: Diagnosis present

## 2018-07-12 DIAGNOSIS — Z7401 Bed confinement status: Secondary | ICD-10-CM

## 2018-07-12 LAB — BASIC METABOLIC PANEL
Anion gap: 11 (ref 5–15)
BUN: 81 mg/dL — ABNORMAL HIGH (ref 8–23)
CALCIUM: 8.6 mg/dL — AB (ref 8.9–10.3)
CO2: 29 mmol/L (ref 22–32)
Chloride: 93 mmol/L — ABNORMAL LOW (ref 98–111)
Creatinine, Ser: 1.22 mg/dL — ABNORMAL HIGH (ref 0.44–1.00)
GFR, EST AFRICAN AMERICAN: 45 mL/min — AB (ref >60)
GFR, EST NON AFRICAN AMERICAN: 39 mL/min — AB (ref >60)
Glucose, Bld: 115 mg/dL — ABNORMAL HIGH (ref 70–99)
Potassium: 5.2 mmol/L — ABNORMAL HIGH (ref 3.5–5.1)
SODIUM: 133 mmol/L — AB (ref 135–145)

## 2018-07-12 LAB — CBC WITH DIFFERENTIAL/PLATELET
BASOS ABS: 0 10*3/uL (ref 0–0.1)
BASOS PCT: 0 %
EOS ABS: 0 10*3/uL (ref 0–0.7)
Eosinophils Relative: 0 %
HCT: 24 % — ABNORMAL LOW (ref 35.0–47.0)
Hemoglobin: 8.2 g/dL — ABNORMAL LOW (ref 12.0–16.0)
Lymphocytes Relative: 7 %
Lymphs Abs: 0.6 10*3/uL — ABNORMAL LOW (ref 1.0–3.6)
MCH: 29.7 pg (ref 26.0–34.0)
MCHC: 34 g/dL (ref 32.0–36.0)
MCV: 87.3 fL (ref 80.0–100.0)
MONO ABS: 0.5 10*3/uL (ref 0.2–0.9)
MONOS PCT: 6 %
Neutro Abs: 7.6 10*3/uL — ABNORMAL HIGH (ref 1.4–6.5)
Neutrophils Relative %: 87 %
Platelets: 445 10*3/uL — ABNORMAL HIGH (ref 150–440)
RBC: 2.75 MIL/uL — ABNORMAL LOW (ref 3.80–5.20)
RDW: 17.1 % — AB (ref 11.5–14.5)
WBC: 8.8 10*3/uL (ref 3.6–11.0)

## 2018-07-12 LAB — LACTIC ACID, PLASMA
LACTIC ACID, VENOUS: 1.4 mmol/L (ref 0.5–1.9)
LACTIC ACID, VENOUS: 1.6 mmol/L (ref 0.5–1.9)

## 2018-07-12 LAB — MRSA PCR SCREENING: MRSA by PCR: POSITIVE — AB

## 2018-07-12 LAB — TROPONIN I: TROPONIN I: 0.06 ng/mL — AB (ref <0.03)

## 2018-07-12 LAB — PROCALCITONIN: PROCALCITONIN: 0.61 ng/mL

## 2018-07-12 MED ORDER — FUROSEMIDE 10 MG/ML IJ SOLN
40.0000 mg | Freq: Once | INTRAMUSCULAR | Status: AC
  Administered 2018-07-12: 22:00:00 40 mg via INTRAVENOUS
  Filled 2018-07-12: qty 4

## 2018-07-12 MED ORDER — ONDANSETRON HCL 4 MG/2ML IJ SOLN: 4.0000 mg | Freq: Four times a day (QID) | INTRAMUSCULAR | Status: DC | PRN

## 2018-07-12 MED ORDER — IPRATROPIUM-ALBUTEROL 0.5-2.5 (3) MG/3ML IN SOLN: 3.0000 mL | Freq: Four times a day (QID) | RESPIRATORY_TRACT | Status: DC | PRN

## 2018-07-12 MED ORDER — PANTOPRAZOLE SODIUM 40 MG PO PACK
40.0000 mg | PACK | Freq: Every day | ORAL | Status: DC
  Administered 2018-07-13 – 2018-07-14 (×2): 40 mg via ORAL
  Filled 2018-07-12 (×2): qty 20

## 2018-07-12 MED ORDER — ONDANSETRON HCL 4 MG PO TABS: 4.0000 mg | ORAL_TABLET | Freq: Four times a day (QID) | ORAL | Status: DC | PRN

## 2018-07-12 MED ORDER — AMLODIPINE BESYLATE 10 MG PO TABS
10.0000 mg | ORAL_TABLET | Freq: Every day | ORAL | Status: DC
  Administered 2018-07-12 – 2018-07-14 (×3): 10 mg
  Filled 2018-07-12 (×3): qty 1

## 2018-07-12 MED ORDER — VANCOMYCIN HCL IN DEXTROSE 1-5 GM/200ML-% IV SOLN
1000.0000 mg | Freq: Once | INTRAVENOUS | Status: AC
  Administered 2018-07-12: 1000 mg via INTRAVENOUS
  Filled 2018-07-12: qty 200

## 2018-07-12 MED ORDER — BISACODYL 10 MG RE SUPP
10.0000 mg | Freq: Every day | RECTAL | Status: DC | PRN
  Filled 2018-07-12: qty 1

## 2018-07-12 MED ORDER — FREE WATER
200.0000 mL | Freq: Four times a day (QID) | Status: DC
  Administered 2018-07-12 – 2018-07-14 (×8): 200 mL

## 2018-07-12 MED ORDER — ATORVASTATIN CALCIUM 20 MG PO TABS
20.0000 mg | ORAL_TABLET | Freq: Every day | ORAL | Status: DC
  Administered 2018-07-12 – 2018-07-13 (×2): 20 mg
  Filled 2018-07-12 (×2): qty 1

## 2018-07-12 MED ORDER — ACETAMINOPHEN 160 MG/5ML PO SOLN
650.0000 mg | Freq: Four times a day (QID) | ORAL | Status: DC | PRN
  Filled 2018-07-12: qty 20.3

## 2018-07-12 MED ORDER — SODIUM CHLORIDE 0.9% FLUSH
3.0000 mL | INTRAVENOUS | Status: DC | PRN
  Administered 2018-07-14: 3 mL via INTRAVENOUS
  Filled 2018-07-12: qty 3

## 2018-07-12 MED ORDER — SODIUM CHLORIDE 0.9 % IV SOLN
2.0000 g | Freq: Once | INTRAVENOUS | Status: AC
  Administered 2018-07-12: 2 g via INTRAVENOUS
  Filled 2018-07-12: qty 2

## 2018-07-12 MED ORDER — SODIUM CHLORIDE 0.9 % IV SOLN: 250.0000 mL | INTRAVENOUS | Status: DC | PRN

## 2018-07-12 MED ORDER — VITAMIN B-12 1000 MCG PO TABS
5000.0000 ug | ORAL_TABLET | Freq: Every day | ORAL | Status: DC
  Administered 2018-07-13 – 2018-07-14 (×2): 5000 ug
  Filled 2018-07-12 (×2): qty 5

## 2018-07-12 MED ORDER — PRO-STAT SUGAR FREE PO LIQD
30.0000 mL | Freq: Every day | ORAL | Status: DC
  Administered 2018-07-12 – 2018-07-14 (×3): 30 mL

## 2018-07-12 MED ORDER — FUROSEMIDE 10 MG/ML IJ SOLN
40.0000 mg | Freq: Two times a day (BID) | INTRAMUSCULAR | Status: DC
  Administered 2018-07-12 – 2018-07-14 (×4): 40 mg via INTRAVENOUS
  Filled 2018-07-12 (×4): qty 4

## 2018-07-12 MED ORDER — SODIUM CHLORIDE 0.9% FLUSH
3.0000 mL | Freq: Two times a day (BID) | INTRAVENOUS | Status: DC
  Administered 2018-07-12 – 2018-07-14 (×4): 3 mL via INTRAVENOUS

## 2018-07-12 MED ORDER — ALBUTEROL SULFATE (2.5 MG/3ML) 0.083% IN NEBU: 2.5000 mg | INHALATION_SOLUTION | Freq: Four times a day (QID) | RESPIRATORY_TRACT | Status: DC | PRN

## 2018-07-12 MED ORDER — CHLORHEXIDINE GLUCONATE 0.12 % MT SOLN
15.0000 mL | Freq: Two times a day (BID) | OROMUCOSAL | Status: DC
  Administered 2018-07-12 – 2018-07-14 (×4): 15 mL via OROMUCOSAL
  Filled 2018-07-12 (×3): qty 15

## 2018-07-12 MED ORDER — CLONIDINE HCL 0.1 MG PO TABS
0.2000 mg | ORAL_TABLET | Freq: Two times a day (BID) | ORAL | Status: DC
  Administered 2018-07-12 – 2018-07-14 (×4): 0.2 mg
  Filled 2018-07-12 (×4): qty 2

## 2018-07-12 MED ORDER — POLYVINYL ALCOHOL 1.4 % OP SOLN
1.0000 [drp] | OPHTHALMIC | Status: DC
  Administered 2018-07-12 – 2018-07-14 (×11): 1 [drp] via OPHTHALMIC
  Filled 2018-07-12 (×2): qty 15

## 2018-07-12 MED ORDER — ARTIFICIAL TEARS OPHTHALMIC OINT
TOPICAL_OINTMENT | Freq: Every day | OPHTHALMIC | Status: DC
Start: 2018-07-12 — End: 2018-07-15
  Administered 2018-07-12 – 2018-07-14 (×3): via OPHTHALMIC
  Filled 2018-07-12: qty 3.5

## 2018-07-12 MED ORDER — HYOSCYAMINE SULFATE 0.125 MG SL SUBL
0.1250 mg | SUBLINGUAL_TABLET | SUBLINGUAL | Status: DC
  Filled 2018-07-12 (×6): qty 1

## 2018-07-12 MED ORDER — FERROUS SULFATE 300 (60 FE) MG/5ML PO SYRP
300.0000 mg | ORAL_SOLUTION | Freq: Two times a day (BID) | ORAL | Status: DC
  Administered 2018-07-12: 17:00:00 300 mg
  Filled 2018-07-12 (×3): qty 5

## 2018-07-12 MED ORDER — OSMOLITE 1.2 CAL PO LIQD
1000.0000 mL | ORAL | Status: DC
Start: 2018-07-12 — End: 2018-07-14
  Administered 2018-07-12 – 2018-07-14 (×3): 1000 mL

## 2018-07-12 MED ORDER — BIOTENE DRY MOUTH MT LIQD
15.0000 mL | Freq: Four times a day (QID) | OROMUCOSAL | Status: DC
  Administered 2018-07-12 – 2018-07-14 (×6): 15 mL via OROMUCOSAL

## 2018-07-12 MED ORDER — APIXABAN 2.5 MG PO TABS
2.5000 mg | ORAL_TABLET | Freq: Two times a day (BID) | ORAL | Status: DC
  Administered 2018-07-12 – 2018-07-14 (×4): 2.5 mg via ORAL
  Filled 2018-07-12 (×4): qty 1

## 2018-07-12 MED ORDER — ORAL CARE MOUTH RINSE
15.0000 mL | Freq: Two times a day (BID) | OROMUCOSAL | Status: DC
  Administered 2018-07-12 – 2018-07-14 (×5): 15 mL via OROMUCOSAL

## 2018-07-12 MED ORDER — METOPROLOL TARTRATE 50 MG PO TABS
50.0000 mg | ORAL_TABLET | Freq: Two times a day (BID) | ORAL | Status: DC
  Administered 2018-07-12 – 2018-07-14 (×4): 50 mg
  Filled 2018-07-12 (×4): qty 1

## 2018-07-12 MED ORDER — VANCOMYCIN HCL IN DEXTROSE 750-5 MG/150ML-% IV SOLN
750.0000 mg | INTRAVENOUS | Status: DC
  Administered 2018-07-13 – 2018-07-14 (×2): 750 mg via INTRAVENOUS
  Filled 2018-07-12 (×3): qty 150

## 2018-07-12 MED ORDER — SODIUM CHLORIDE 0.9 % IV SOLN
2.0000 g | INTRAVENOUS | Status: DC
  Administered 2018-07-13 – 2018-07-14 (×2): 2 g via INTRAVENOUS
  Filled 2018-07-12 (×3): qty 2

## 2018-07-12 NOTE — ED Notes (Signed)
Provider to bedside , unable to draw labs at this time .

## 2018-07-12 NOTE — ED Provider Notes (Signed)
[facility] Emergency Department Provider Note   ____________________________________________   I have reviewed the triage vital signs and the nursing notes.   HISTORY  Chief Complaint Respiratory Distress   History limited by and level 5 caveat due to: Altered Mental Status   HPI Dawn [name] is a 84 y.o. female who presents to the emergency department today via EMS from [facility] because of concern for respiratory distress and altered mental status. The patient was at [facility] after a stroke. Unclear what her baseline status was after the stroke. However this morning staff noticed the patient was apparently more sob, pulse ox was in the 60s. The patient was also found to be more altered than normal. The patient herself cannot give any history.   Per medical record review patient has a history of recent hospitalization for stroke. Per palliative care note patient is DNR and DNI.  Past Medical History:  Diagnosis Date  . Cancer ([place])    skin cancer  . 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 Hypertension   . Sciatica     Patient Active Problem List   Diagnosis Date Noted  . Atrial flutter ([place]) 07/01/2018  . Leukocytosis 07/01/2018  . Constipation 07/01/2018  . Hypokalemia   . Paroxysmal atrial fibrillation (HCC)   . Oropharyngeal dysphagia   . Goals of care, counseling/discussion   . Palliative care encounter   . Excessive oral secretions   . Acute right arterial ischemic stroke, middle cerebral artery (MCA) ([place]) s/p IV tPA 06/21/2018  . Symptomatic anemia 03/07/2018  . TIA (transient ischemic attack) 01/22/2018  . Pressure injury of skin 01/04/2018  . Sepsis ([place]) 01/03/2018  . Protein-calorie malnutrition, severe 11/14/2017  . Atherosclerosis of native arteries of extremity with rest pain ([place]) 11/09/2017  . Ischemic leg 11/09/2017  . Atherosclerotic peripheral vascular disease  with rest pain ([place]) 11/09/2017  . Atypical chest pain 09/21/2017  . Renal artery stenosis ([place]) 06/15/2017  . Hyperlipidemia 05/09/2017  . Essential hypertension 10/17/2016  . Carotid stenosis 10/17/2016  . PVD (peripheral vascular disease) ([place]) 10/17/2016  . B12 deficiency 07/19/2016  . Vitamin D deficiency, unspecified 07/19/2016  . History of right hip replacement 11/04/2015  . Aftercare following bilateral hip joint replacement surgery 11/05/2014  . SCC (squamous cell carcinoma) 05/21/2014  . COPD with asthma ([place]) 03/16/2014  . S/P hip replacement 10/03/2012    Past Surgical History:  Procedure Laterality Date  . ABDOMINAL HYSTERECTOMY    . APPENDECTOMY    . CATARACT EXTRACTION, BILATERAL    . CHOLECYSTECTOMY    . ENDARTERECTOMY FEMORAL Right 04/19/2017   Procedure: ENDARTERECTOMY COMMON FEMORAL ARTERY, PROFUNDA  FEMORIS ARTERY, and  SUPERFICIAL FEMORAL ARTERY;  Surgeon: Algernon Huxley, MD;  Location: [facility];  Service: Vascular;  Laterality: Right;  . ENDARTERECTOMY FEMORAL Left 11/14/2017   Procedure: ENDARTERECTOMY FEMORAL;  Surgeon: Algernon Huxley, MD;  Location: [facility];  Service: Vascular;  Laterality: Left;  . ESOPHAGOGASTRODUODENOSCOPY (EGD) WITH PROPOFOL N/A 06/28/2018   Procedure: ESOPHAGOGASTRODUODENOSCOPY (EGD) WITH PROPOFOL;  Surgeon: Judeth Horn, MD;  Location: [place];  Service: General;  Laterality: N/A;  . HIP SURGERY Bilateral   . LOWER EXTREMITY ANGIOGRAM Right 04/19/2017   Procedure: LOWER EXTREMITY ANGIOGRAM WITH SUPERFICIAL FEMORAL ARTERY STENT PLACEMENT;  Surgeon: Algernon Huxley, MD;  Location: [facility];  Service: Vascular;  Laterality: Right;  . LOWER EXTREMITY ANGIOGRAPHY Right 04/18/2017   Procedure: Lower  Extremity Angiography;  Surgeon: Algernon Huxley, MD;  Location: [place] [facility];  Service: Cardiovascular;  Laterality: Right;  . LOWER EXTREMITY ANGIOGRAPHY Left 07/12/2017   Procedure: Lower Extremity Angiography;  Surgeon: Algernon Huxley, MD;   Location: [place] [facility];  Service: Cardiovascular;  Laterality: Left;  . LOWER EXTREMITY ANGIOGRAPHY Left 11/09/2017   Procedure: LOWER EXTREMITY ANGIOGRAPHY;  Surgeon: Algernon Huxley, MD;  Location: [place] [facility];  Service: Cardiovascular;  Laterality: Left;  . LOWER EXTREMITY INTERVENTION  07/12/2017   Procedure: Lower Extremity Intervention;  Surgeon: Algernon Huxley, MD;  Location: [place] [facility];  Service: Cardiovascular;;  . LOWER EXTREMITY INTERVENTION  11/09/2017   Procedure: LOWER EXTREMITY INTERVENTION;  Surgeon: Algernon Huxley, MD;  Location: [place] [facility];  Service: Cardiovascular;;  . PEG PLACEMENT N/A 06/28/2018   Procedure: PERCUTANEOUS ENDOSCOPIC GASTROSTOMY (PEG) PLACEMENT;  Surgeon: Judeth Horn, MD;  Location: [place];  Service: General;  Laterality: N/A;  . RENAL ANGIOGRAPHY N/A 06/21/2017   Procedure: Renal Angiography;  Surgeon: Algernon Huxley, MD;  Location: [place] [facility];  Service: Cardiovascular;  Laterality: N/A;  . SHOULDER SURGERY Bilateral   . TOTAL HIP ARTHROPLASTY Bilateral     Prior to Admission medications   Medication Sig Start Date End Date Taking? Authorizing Provider  acetaminophen (TYLENOL) 160 MG/5ML solution Place 20.3 mLs (650 mg total) into feeding tube every 6 (six) hours as needed for moderate pain. 07/01/18   Donzetta Starch, NP  albuterol (PROVENTIL) (2.5 MG/3ML) 0.083% nebulizer solution Take 3 mLs (2.5 mg total) by nebulization every 6 (six) hours as needed for wheezing or shortness of breath. 07/01/18   Donzetta Starch, NP  Amino Acids-Protein Hydrolys (FEEDING SUPPLEMENT, PRO-STAT SUGAR FREE 64,) LIQD Place 30 mLs into feeding tube daily. 07/02/18   Donzetta Starch, NP  amLODipine (NORVASC) 10 MG tablet Place 1 tablet (10 mg total) into feeding tube daily. 07/02/18   Donzetta Starch, NP  apixaban (ELIQUIS) 2.5 MG TABS tablet Take 1 tablet (2.5 mg total) by mouth 2 (two) times daily. 07/01/18   Donzetta Starch, NP  artificial tears  (LACRILUBE) OINT ophthalmic ointment Place into both eyes at bedtime. 07/01/18   Donzetta Starch, NP  atorvastatin (LIPITOR) 20 MG tablet Place 1 tablet (20 mg total) into feeding tube daily at 6 PM. 07/01/18   Donzetta Starch, NP  bisacodyl (DULCOLAX) 10 MG suppository Place 1 suppository (10 mg total) rectally daily as needed for moderate constipation. 07/01/18   Donzetta Starch, NP  cloNIDine (CATAPRES) 0.2 MG tablet Place 1 tablet (0.2 mg total) into feeding tube 2 (two) times daily. 07/01/18   Donzetta Starch, NP  Cyanocobalamin (VITAMIN B-12) 2500 MCG TABS Place 5,000 mcg into feeding tube daily. 07/02/18   Donzetta Starch, NP  ferrous sulfate 300 (60 Fe) MG/5ML syrup Place 5 mLs (300 mg total) into feeding tube 2 (two) times daily with a meal. 07/01/18   Donzetta Starch, NP  glycopyrrolate (ROBINUL) 1 MG tablet Take 1 tablet (1 mg total) by mouth 3 (three) times daily. 07/04/18   Donzetta Starch, NP  metoprolol tartrate (LOPRESSOR) 25 mg/10 mL SUSP Place 20 mLs (50 mg total) into feeding tube 2 (two) times daily. 07/03/18   Donzetta Starch, NP  Nutritional Supplements (FEEDING SUPPLEMENT, OSMOLITE 1.2 CAL,) LIQD Place 1,000 mLs into feeding tube continuous. 07/01/18   Donzetta Starch, NP  pantoprazole sodium (PROTONIX) 40  mg/20 mL PACK Place 20 mLs (40 mg total) into feeding tube daily. 07/02/18   Donzetta Starch, NP  polyvinyl alcohol (LIQUIFILM TEARS) 1.4 % ophthalmic solution Place 1 drop into both eyes every 4 (four) hours while awake. 07/01/18   Donzetta Starch, NP  Water For Irrigation, Sterile (FREE WATER) SOLN Place 200 mLs into feeding tube every 6 (six) hours. 07/03/18   Donzetta Starch, NP    Allergies Percocet [oxycodone-acetaminophen] and Percodan [oxycodone-aspirin]  Family History  Problem Relation Age of Onset  . Diabetes Father   . Heart disease Father     Social History Social History   Tobacco Use  . Smoking status: Former Smoker    Years: 30.00    Types: Cigarettes    Last attempt to quit: 1980     Years since quitting: 39.6  . Smokeless tobacco: Never Used  Substance Use Topics  . Alcohol use: No  . Drug use: No    Review of Systems Unable to obtain secondary to AMS ____________________________________________   PHYSICAL EXAM:  VITAL SIGNS: ED Triage Vitals [07/16/2018 0629]  Enc Vitals Group     BP      Pulse Rate 89     Resp (!) 28     Temp 97.9 F (36.6 C)     Temp src      SpO2 98 %   Constitutional: Awake, minimally alert. Not answering questions.  Eyes: Conjunctivae are normal.  ENT      Head: Normocephalic and atraumatic.      Nose: No congestion/rhinnorhea.      Mouth/Throat: Mucous membranes are moist.      Neck: No stridor. Hematological/Lymphatic/Immunilogical: No cervical lymphadenopathy. Cardiovascular: Normal rate, regular rhythm.  No murmurs, rubs, or gallops.  Respiratory: Increased respiratory rate and effort. Diminished breath sounds noted in right lower lung. Gastrointestinal: Soft and non tender. No rebound. No guarding.  Genitourinary: Deferred Musculoskeletal: Normal range of motion in all extremities. No lower extremity edema. Neurologic:  Awake, minimally alert. Not following commands. Non verbal. Skin:  Skin is warm, dry and intact. No rash noted.  ____________________________________________    LABS (pertinent positives/negatives)  Pending  ____________________________________________   EKG  Apolonio Schneiders, attending physician, personally viewed and interpreted this EKG  EKG Time: 0632 Rate: 74 Rhythm: sinus rhythm Axis: normal Intervals: qtc 436 QRS: narrow ST changes: no st elevation Impression: normal ekg   ____________________________________________    RADIOLOGY  CXR Concern for chf, question cavitary pneumonia   ____________________________________________   PROCEDURES  Procedures  ____________________________________________   INITIAL IMPRESSION / ASSESSMENT AND PLAN / ED  COURSE  Pertinent labs & imaging results that were available during my care of the patient were reviewed by me and considered in my medical decision making (see chart for details).   Patient presented to the emergency department from [facility] today because of concerns for respiratory distress and altered mental status.  On exam patient is minimally alert.  Would have concerns for infection.  Chest x-ray concerning for possible pneumonia.  Will treat for healthcare associated.  Awaiting blood work at time of signout.   ____________________________________________   FINAL CLINICAL IMPRESSION(S) / ED DIAGNOSES Pneumonia Altered mental status  Note: This dictation was prepared with Diplomatic Services operational officer dictation. Any transcriptional errors that result from this process are unintentional     Nance Pear, MD 07/01/2018 [phone]

## 2018-07-12 NOTE — Clinical Social Work Note (Signed)
Clinical Social Work Assessment  Patient Details  Name: Dawn [name] MRN: [id number] Date of Birth: [birth year]  Date of referral:  07/05/2018               Reason for consult:  Other (Comment Required)(From Facility)                Permission sought to share information with:  Facility Sport and exercise psychologist, Family Supports Permission granted to share information::  Yes, Verbal Permission Granted  Name::     Dawn [name] [phone]  Agency::     Relationship::  Son Administrator, arts Information:     Housing/Transportation Living arrangements for the past 2 months:  [place] of Information:  Adult Children Patient Interpreter Needed:  None Criminal Activity/Legal Involvement Pertinent to Current Situation/Hospitalization:  No - Comment as needed Significant Relationships:  Adult Children, Other Family Members Lives with:  Facility Resident Do you feel safe going back to the place where you live?  Yes Need for family participation in patient care:  Yes (Comment)  Care giving concerns: Son reports his mother receives good care at [facility]   Social Worker assessment / plan: LCSW introduced myself to patient and her son and received verbal consent to ask some questions. HCPOA is [place] and he reports she did well around April then had a Stroke and ended up in [facility] and received PT/OT and speech therapy. He reports his mother is not doing that well. Patient has excellent family support and is non ambulatory at this time and needs full assist with all her ADL's Patient has Peg Tube as she is unable to swallow. Current DNR Medicare BCBS Patient will return to [facility] once DC.    Employment status:  Retired Forensic scientist:  [employer] PT Recommendations:  [place] / Referral to community resources:  [place]  Patient/Family's Response to care:  Very concerned with her  breathing  Patient/Family's Understanding of and Emotional Response to Diagnosis, Current Treatment, and Prognosis: Good understanding  Emotional Assessment Appearance:  Appears stated age Attitude/Demeanor/Rapport:  Unable to Assess Affect (typically observed):  Unable to Assess Orientation:  Oriented to Self, Oriented to Place, Oriented to Situation Alcohol / Substance use:  Not Applicable Psych involvement (Current and /or in the community):  No (Comment)  Discharge Needs  Concerns to be addressed:  No discharge needs identified Readmission within the last 30 days:  No Current discharge risk:  Physical Impairment Barriers to Discharge:  Continued Medical Work up   Joana Reamer, LCSW 06/28/2018, 2:31 PM

## 2018-07-12 NOTE — ED Notes (Signed)
Attempt to call report, nurse states she is changing pts room for safety and call back in 5 min.

## 2018-07-12 NOTE — ED Notes (Signed)
Pt suctioned, large tenacious sputum noted.

## 2018-07-12 NOTE — Progress Notes (Signed)
Advanced care plan. Purpose of the Encounter: CODE STATUS Parties in Attendance: Patient and family Patient's Decision Capacity: Not great Subjective/Patient's story: Presented to emergency room for decreased responsiveness and low oxygen saturation Objective/Medical story Has pneumonia and chest congestion Needs IV antibiotics and IV Lasix for diuresis Goals of care determination:  Advance care directives goals of care discussed with patient and family Patient and family do not want any CPR, intubation if the need arises CODE STATUS: DNR Time spent discussing advanced care planning: 16 minutes

## 2018-07-12 NOTE — ED Notes (Signed)
Family at bedside. Updated on the status of IV placement. This RN explained to family that IV team arrival time is 12 and it would be sometime after that. Family understands at this time. Pt is resting comfortably with son at bedside.

## 2018-07-12 NOTE — ED Notes (Signed)
X-ray to bedside at this time.

## 2018-07-12 NOTE — Progress Notes (Signed)
Pharmacy Antibiotic Note  Dawn [name] is a 84 y.o. female admitted on 07/19/2018 with pneumonia.  Pharmacy has been consulted for cefepime and vancomycin dosing.  Plan: Cefepime 2 g iv q 24 hours.   Vancomycin 1000 mg iv once followed by 750 mg iv  q 24 hours with stacked dosing. F/U PCT, MRSA PCR.   Ke= 0.028 t1/2= 25 hours Vd= 38 L     Temp (24hrs), Avg:98 F (36.7 C), Min:97.9 F (36.6 C), Max:98.1 F (36.7 C)  Recent Labs  Lab 07/16/2018 1136 07/11/2018 1512  WBC 8.8  --   CREATININE 1.22*  --   LATICACIDVEN 1.4 1.6    Estimated Creatinine Clearance: 28.3 mL/min (A) (by C-G formula based on SCr of 1.22 mg/dL (H)).    Allergies  Allergen Reactions  . Percocet [Oxycodone-Acetaminophen] Itching  . Percodan [Oxycodone-Aspirin] Itching    Antimicrobials this admission: cefepime 8/16 >>  vancomycin 8/16 >>   Dose adjustments this admission:   Microbiology results: 8/16 BCx:  MRSA PCR:   Thank you for allowing pharmacy to be a part of this patient's care.  Napoleon Form 07/06/2018 6:17 PM

## 2018-07-12 NOTE — Progress Notes (Signed)
Chaplain received page for RR. Chaplain made pastoral presence know, while care team work with patient. After being Chaplain ask was there anything that she could done. Nurse said no and thanked Chaplain for coming.

## 2018-07-12 NOTE — ED Provider Notes (Signed)
-----------------------------------------   8:25 AM on 07/16/2018 -----------------------------------------  I took over care on this patient from Dr. Archie Balboa.  Patient is pending lab work-up.  ED ECG REPORT I, Arta Silence, the attending physician, personally viewed and interpreted this ECG.  Date: 07/27/2018 EKG Time: 0632 Rate: 74 Rhythm: normal sinus rhythm QRS Axis: normal Intervals: normal ST/T Wave abnormalities: Repolarization abnormality Narrative Interpretation: no evidence of acute ischemia; no significant change when compared to EKG of 06/23/2018  ----------------------------------------- 12:36 PM on 07/05/2018 -----------------------------------------  Lab work-up was delayed because of the patient's difficult IV access.  She had numerous sticks by RNs as well as attempts by Dr. Archie Balboa for EJ and ultrasound-guided peripheral access, with no success.  I discussed central line with the patient's family member, however given the fact that her vital signs were stable, she did not need any emergent medications, based on shared decision making we elected to wait for the IV team.  IV is now successfully established.  The patient has been given antibiotics for HCAP.  I signed the patient out to the hospitalist Dr. Bridgett Larsson.   Arta Silence, MD 07/16/2018 1240

## 2018-07-12 NOTE — Progress Notes (Signed)
Suctioned back of patient's throat for copious amount of white thick secretions. Patient assessed to see if she needed to be nasally suctioned per RN.  BBS were clear. Audible secretions noted from airway. Patient on nasal o2. Saturation noted at 94. Patient with spontaneous cough during the process. More secretions noted however patient not very tolerable of efforts but understandable and pleasant.

## 2018-07-12 NOTE — H&P (Addendum)
[facility] NAME: Dawn [name]    MR#:  [id number]  DATE OF BIRTH:  May 04, 1928  DATE OF ADMISSION:  07/25/2018  PRIMARY CARE PHYSICIAN: Idelle Crouch, MD   REQUESTING/REFERRING PHYSICIAN:   CHIEF COMPLAINT:   Chief Complaint  Patient presents with  . Respiratory Distress    HISTORY OF PRESENT ILLNESS: Dawn [name]  is a 84 y.o. female with a known history of CVA, hypertension, paroxysmal atrial fibrillation, oropharyngeal dysphagia on PEG tube feeding, skin cancer, sciatica, hypertension currently is a resident of [facility] facility.  She was found to be lethargic with decreased responsiveness and oxygen saturation was low.  Patient was evaluated and transferred to the emergency room at our hospital.  She was worked up with chest x-ray which showed a right lung pneumonia with congestion and fluid.  Patient started on broad-spectrum IV antibiotics and put on oxygen via nasal cannula.  Not a great historian.  PAST MEDICAL HISTORY:   Past Medical History:  Diagnosis Date  . Cancer ([place])    skin cancer  . 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 Hypertension   . Sciatica     PAST SURGICAL HISTORY:  Past Surgical History:  Procedure Laterality Date  . ABDOMINAL HYSTERECTOMY    . APPENDECTOMY    . CATARACT EXTRACTION, BILATERAL    . CHOLECYSTECTOMY    . ENDARTERECTOMY FEMORAL Right 04/19/2017   Procedure: ENDARTERECTOMY COMMON FEMORAL ARTERY, PROFUNDA  FEMORIS ARTERY, and  SUPERFICIAL FEMORAL ARTERY;  Surgeon: Algernon Huxley, MD;  Location: [facility];  Service: Vascular;  Laterality: Right;  . ENDARTERECTOMY FEMORAL Left 11/14/2017   Procedure: ENDARTERECTOMY FEMORAL;  Surgeon: Algernon Huxley, MD;  Location: [facility];  Service: Vascular;  Laterality: Left;  . ESOPHAGOGASTRODUODENOSCOPY (EGD) WITH PROPOFOL N/A 06/28/2018   Procedure: ESOPHAGOGASTRODUODENOSCOPY (EGD) WITH  PROPOFOL;  Surgeon: Judeth Horn, MD;  Location: [facility];  Service: General;  Laterality: N/A;  . HIP SURGERY Bilateral   . LOWER EXTREMITY ANGIOGRAM Right 04/19/2017   Procedure: LOWER EXTREMITY ANGIOGRAM WITH SUPERFICIAL FEMORAL ARTERY STENT PLACEMENT;  Surgeon: Algernon Huxley, MD;  Location: [facility];  Service: Vascular;  Laterality: Right;  . LOWER EXTREMITY ANGIOGRAPHY Right 04/18/2017   Procedure: Lower Extremity Angiography;  Surgeon: Algernon Huxley, MD;  Location: [place] [facility];  Service: Cardiovascular;  Laterality: Right;  . LOWER EXTREMITY ANGIOGRAPHY Left 07/12/2017   Procedure: Lower Extremity Angiography;  Surgeon: Algernon Huxley, MD;  Location: [place] [facility];  Service: Cardiovascular;  Laterality: Left;  . LOWER EXTREMITY ANGIOGRAPHY Left 11/09/2017   Procedure: LOWER EXTREMITY ANGIOGRAPHY;  Surgeon: Algernon Huxley, MD;  Location: [place];  Service: Cardiovascular;  Laterality: Left;  . LOWER EXTREMITY INTERVENTION  07/12/2017   Procedure: Lower Extremity Intervention;  Surgeon: Algernon Huxley, MD;  Location: [place] [facility];  Service: Cardiovascular;;  . LOWER EXTREMITY INTERVENTION  11/09/2017   Procedure: LOWER EXTREMITY INTERVENTION;  Surgeon: Algernon Huxley, MD;  Location: [place] [facility];  Service: Cardiovascular;;  . PEG PLACEMENT N/A 06/28/2018   Procedure: PERCUTANEOUS ENDOSCOPIC GASTROSTOMY (PEG) PLACEMENT;  Surgeon: Judeth Horn, MD;  Location: [place];  Service: General;  Laterality: N/A;  . RENAL ANGIOGRAPHY N/A 06/21/2017   Procedure: Renal Angiography;  Surgeon: Algernon Huxley, MD;  Location: [place] [facility];  Service: Cardiovascular;  Laterality: N/A;  . SHOULDER  SURGERY Bilateral   . TOTAL HIP ARTHROPLASTY Bilateral     SOCIAL HISTORY:  Social History   Tobacco Use  . Smoking status: Former Smoker    Years: 30.00    Types: Cigarettes    Last attempt to quit: 1980    Years since quitting: 39.6  . Smokeless tobacco: Never  Used  Substance Use Topics  . Alcohol use: No    FAMILY HISTORY:  Family History  Problem Relation Age of Onset  . Diabetes Father   . Heart disease Father     DRUG ALLERGIES:  Allergies  Allergen Reactions  . Percocet [Oxycodone-Acetaminophen] Itching  . Percodan [Oxycodone-Aspirin] Itching    REVIEW OF SYSTEMS:   CONSTITUTIONAL: No fever,has fatigue and weakness.  EYES: No blurred or double vision.  EARS, NOSE, AND THROAT: No tinnitus or ear pain.  RESPIRATORY: Has cough, shortness of breath, wheezing  np hemoptysis.  CARDIOVASCULAR: No chest pain, orthopnea, edema.  GASTROINTESTINAL: No nausea, vomiting, diarrhea or abdominal pain.  GENITOURINARY: No dysuria, hematuria.  ENDOCRINE: No polyuria, nocturia,  HEMATOLOGY: No anemia, easy bruising or bleeding SKIN: No rash or lesion. MUSCULOSKELETAL: No joint pain or arthritis.   NEUROLOGIC: No tingling, numbness, weakness.  PSYCHIATRY: No anxiety or depression.   MEDICATIONS AT HOME:  Prior to Admission medications   Medication Sig Start Date End Date Taking? Authorizing Provider  acetaminophen (TYLENOL) 160 MG/5ML solution Place 20.3 mLs (650 mg total) into feeding tube every 6 (six) hours as needed for moderate pain. 07/01/18  Yes Donzetta Starch, NP  albuterol (PROVENTIL) (2.5 MG/3ML) 0.083% nebulizer solution Take 3 mLs (2.5 mg total) by nebulization every 6 (six) hours as needed for wheezing or shortness of breath. 07/01/18  Yes Donzetta Starch, NP  amLODipine (NORVASC) 10 MG tablet Place 1 tablet (10 mg total) into feeding tube daily. 07/02/18  Yes Donzetta Starch, NP  antiseptic oral rinse (BIOTENE) LIQD 15 mLs by Mouth Rinse route 4 (four) times daily.   Yes [provider]  apixaban (ELIQUIS) 2.5 MG TABS tablet Take 1 tablet (2.5 mg total) by mouth 2 (two) times daily. 07/01/18  Yes Donzetta Starch, NP  artificial tears (LACRILUBE) OINT ophthalmic ointment Place into both eyes at bedtime. 07/01/18  Yes Donzetta Starch, NP   atorvastatin (LIPITOR) 20 MG tablet Place 1 tablet (20 mg total) into feeding tube daily at 6 PM. 07/01/18  Yes Biby, Massie Kluver, NP  bisacodyl (DULCOLAX) 10 MG suppository Place 1 suppository (10 mg total) rectally daily as needed for moderate constipation. 07/01/18  Yes Donzetta Starch, NP  cloNIDine (CATAPRES) 0.2 MG tablet Place 1 tablet (0.2 mg total) into feeding tube 2 (two) times daily. 07/01/18  Yes Donzetta Starch, NP  Cyanocobalamin (VITAMIN B-12) 2500 MCG TABS Place 5,000 mcg into feeding tube daily. 07/02/18  Yes Donzetta Starch, NP  ferrous sulfate 300 (60 Fe) MG/5ML syrup Place 5 mLs (300 mg total) into feeding tube 2 (two) times daily with a meal. 07/01/18  Yes Biby, Massie Kluver, NP  hyoscyamine (LEVSIN SL) 0.125 MG SL tablet Place 0.125 mg under the tongue every 4 (four) hours. Via tube   Yes [provider]  ipratropium-albuterol (DUONEB) 0.5-2.5 (3) MG/3ML SOLN Take 3 mLs by nebulization every 6 (six) hours as needed.   Yes [provider]  metoprolol tartrate (LOPRESSOR) 25 mg/10 mL SUSP Place 20 mLs (50 mg total) into feeding tube 2 (two) times daily. 07/03/18  Yes Donzetta Starch,  NP  omeprazole (PRILOSEC) 20 MG capsule Take 20 mg by mouth daily. Via tube. Empty contents of capsule in acidic fruit juice. Do not crush.   Yes [provider]  polyvinyl alcohol (LIQUIFILM TEARS) 1.4 % ophthalmic solution Place 1 drop into both eyes every 4 (four) hours while awake. 07/01/18  Yes Donzetta Starch, NP  Amino Acids-Protein Hydrolys (FEEDING SUPPLEMENT, PRO-STAT SUGAR FREE 64,) LIQD Place 30 mLs into feeding tube daily. 07/02/18   Donzetta Starch, NP  glycopyrrolate (ROBINUL) 1 MG tablet Take 1 tablet (1 mg total) by mouth 3 (three) times daily. Patient not taking: Reported on 07/07/2018 07/04/18   Donzetta Starch, NP  indomethacin (INDOCIN) 25 MG capsule Take 25 mg by mouth 2 (two) times daily with a meal. For 3 days    [provider]  Nutritional Supplements (FEEDING SUPPLEMENT,  OSMOLITE 1.2 CAL,) LIQD Place 1,000 mLs into feeding tube continuous. 07/01/18   Donzetta Starch, NP  pantoprazole sodium (PROTONIX) 40 mg/20 mL PACK Place 20 mLs (40 mg total) into feeding tube daily. Patient not taking: Reported on 07/21/2018 07/02/18   Donzetta Starch, NP  Water For Irrigation, Sterile (FREE WATER) SOLN Place 200 mLs into feeding tube every 6 (six) hours. 07/03/18   Donzetta Starch, NP      PHYSICAL EXAMINATION:   VITAL SIGNS: Blood pressure 136/63, pulse 94, temperature 97.9 F (36.6 C), resp. rate (!) 21, SpO2 90 %.  GENERAL:  84 y.o.-year-old patient lying in the bed with no acute distress.  EYES: Pupils equal, round, reactive to light and accommodation. No scleral icterus. Extraocular muscles intact.  HEENT: Head atraumatic, normocephalic. Oropharynx and nasopharynx clear.  NECK:  Supple, no jugular venous distention. No thyroid enlargement, no tenderness.  LUNGS: Normal breath sounds bilaterally, no wheezing, rales,rhonchi or crepitation. No use of accessory muscles of respiration.  CARDIOVASCULAR: S1, S2 normal. No murmurs, rubs, or gallops.  ABDOMEN: Soft, nontender, nondistended. Bowel sounds present. No organomegaly or mass.  EXTREMITIES: No pedal edema, cyanosis, or clubbing.  NEUROLOGIC: Cranial nerves II through XII are intact. Muscle strength 5/5 in all extremities. Sensation intact. Gait not checked.  PSYCHIATRIC: The patient is alert and oriented x 3.  SKIN: No obvious rash, lesion, or ulcer.   LABORATORY PANEL:   CBC Recent Labs  Lab 07/18/2018 1136  WBC 8.8  HGB 8.2*  HCT 24.0*  PLT 445*  MCV 87.3  MCH 29.7  MCHC 34.0  RDW 17.1*  LYMPHSABS 0.6*  MONOABS 0.5  EOSABS 0.0  BASOSABS 0.0   ------------------------------------------------------------------------------------------------------------------  Chemistries  Recent Labs  Lab 07/02/2018 1136  NA 133*  K 5.2*  CL 93*  CO2 29  GLUCOSE 115*  BUN 81*  CREATININE 1.22*  CALCIUM 8.6*    ------------------------------------------------------------------------------------------------------------------ estimated creatinine clearance is 28.3 mL/min (A) (by C-G formula based on SCr of 1.22 mg/dL (H)). ------------------------------------------------------------------------------------------------------------------ No results for input(s): TSH, T4TOTAL, T3FREE, THYROIDAB in the last 72 hours.  Invalid input(s): FREET3   Coagulation profile No results for input(s): INR, PROTIME in the last 168 hours. ------------------------------------------------------------------------------------------------------------------- No results for input(s): DDIMER in the last 72 hours. -------------------------------------------------------------------------------------------------------------------  Cardiac Enzymes Recent Labs  Lab 06/30/2018 1136  TROPONINI 0.06*   ------------------------------------------------------------------------------------------------------------------ Invalid input(s): POCBNP  ---------------------------------------------------------------------------------------------------------------  Urinalysis    Component Value Date/Time   COLORURINE YELLOW 07/02/2018 [place] 07/02/2018 2335   LABSPEC 1.014 07/02/2018 2335   PHURINE 7.0 07/02/2018 2335   GLUCOSEU 50 (A) 07/02/2018 2335  [place] NEGATIVE 07/02/2018 [place] 07/02/2018 2335   [place] 07/02/2018 2335   PROTEINUR 30 (A) 07/02/2018 2335   NITRITE NEGATIVE 07/02/2018 2335   LEUKOCYTESUR NEGATIVE 07/02/2018 2335     RADIOLOGY: Dg Chest Portable 1 View  Result Date: 07/14/2018 CLINICAL DATA:  Altered mental status.  Shortness of breath.  Cough. EXAM: PORTABLE CHEST 1 VIEW COMPARISON:  06/30/2018. FINDINGS: Cardiomegaly with diffuse bilateral pulmonary infiltrates and bilateral pleural effusions. Findings consistent with CHF. Questionable small air  collection noted over the right lung base. Small cavities such as within a cavitary pneumonia cannot be excluded and continued follow-up exam suggested. No pneumothorax. Degenerative change thoracic spine. Postsurgical changes both shoulders. IMPRESSION: Number congestive heart failure with bilateral pulmonary edema and bilateral pleural effusions. 2. Small air collections noted over the right lung base cannot be excluded. Small cavities such as within a cavitary pneumonia cannot be excluded. Continued follow-up chest x-ray suggested. Electronically Signed   By: Marcello Moores  Register   On: 07/24/2018 07:28    EKG: Orders placed or performed during the hospital encounter of 07/02/2018  . EKG 12-Lead  . EKG 12-Lead  . EKG 12-Lead  . EKG 12-Lead  Recent Echo Left ventricle: The cavity size was normal. Systolic function was   vigorous. The estimated ejection fraction was in the range of 65%   to 70%. Wall motion was normal; there were no regional wall   motion abnormalities. Features are consistent with a pseudonormal   left ventricular filling pattern, with concomitant abnormal   relaxation and increased filling pressure (grade 2 diastolic   dysfunction). Doppler parameters are consistent with high   ventricular filling pressure. - Aortic valve: Poorly visualized. Severely calcified annulus.   Trileaflet; moderately thickened, moderately calcified leaflets. - Mitral valve: Severely calcified annulus. Moderately thickened,   mildly calcified leaflets anterior. There was mild to moderate   regurgitation. - Atrial septum: There was increased thickness of the septum,   consistent with lipomatous hypertrophy. - Tricuspid valve: There was mild regurgitation. - Pulmonic valve: There was mild regurgitation.  IMPRESSION AND PLAN:  84 year old elderly female patient with history of CVA, sciatica, oropharyngeal dysphagia on PEG tube feeding, paroxysmal atrial fibrillation, skin cancer presented to the  emergency room from [facility] facility for lethargy decreased oxygen saturation  -Acute pulmonary edema with congestive heart failure changes with diastolic dysfunction IV Lasix for diuresis Daily body weight input output chart Recent echocardiogram reviewed  -Healthcare associated pneumonia Start patient on IV vancomycin and cefepime antibiotics Follow-up cultures  -History of oropharyngeal dysphagia Continue PEG tube feedings  -History of CVA Supportive care  -DVT prophylaxis Patient on anticoagulation with Eliquis  All the records are reviewed and case discussed with ED provider. Management plans discussed with the patient, family and they are in agreement.  CODE STATUS:DNR Code Status History    Date Active Date Inactive Code Status Order ID Comments User Context   06/21/2018 1539 07/04/2018 1747 DNR [id number]  Aroor, Lanice Schwab, MD Inpatient   06/21/2018 1505 06/21/2018 1539 Full Code [id number]  Aroor, Lanice Schwab, MD Inpatient   03/07/2018 1856 03/08/2018 2249 Full Code [id number]  Henreitta Leber, MD Inpatient   01/22/2018 1949 01/23/2018 2253 Full Code [id number]  Saundra Shelling, MD Inpatient   01/03/2018 1521 01/14/2018 1954 Full Code [id number]  Gorden Harms, MD Inpatient   11/09/2017 1131 11/22/2017 1913 Full Code [id number]  Sela Hua, PA-C Inpatient   11/09/2017 1019 11/09/2017 1122 Full Code [id number]  Leonides Schanz Outpatient   11/09/2017 1019 11/09/2017 1019 Full Code [id number]  Algernon Huxley, MD Outpatient   07/12/2017 1005 07/12/2017 1656 Full Code [id number]  Algernon Huxley, MD Inpatient   06/21/2017 1125 06/21/2017 1622 Full Code [id number]  Algernon Huxley, MD Inpatient   04/18/2017 1150 04/18/2017 1302 Full Code [id number]  Algernon Huxley, MD Inpatient   04/18/2017 1104 04/18/2017 1150 Full Code [id number]  Algernon Huxley, MD Inpatient    Questions for Most Recent Historical Code Status (Order [id number])    Question Answer Comment   In the event of cardiac or  respiratory ARREST Do not call a "code blue"    In the event of cardiac or respiratory ARREST Do not perform Intubation, CPR, defibrillation or ACLS    In the event of cardiac or respiratory ARREST Use medication by any route, position, wound care, and other measures to relive pain and suffering. May use oxygen, suction and manual treatment of airway obstruction as needed for comfort.    Comments advanced directive        TOTAL TIME TAKING CARE OF THIS PATIENT: 53 minutes.    Saundra Shelling M.D on 07/19/2018 at 1:47 PM  Between 7am to 6pm - Pager - [phone]  After 6pm go to [email or web address] - password [email or web address] [facility]  Office  [phone]  CC: Primary care physician; Idelle Crouch, MD

## 2018-07-12 NOTE — NC FL2 (Addendum)
[place] LEVEL OF CARE SCREENING TOOL     IDENTIFICATION  Patient Name: Dawn [name] Birthdate: 1928-11-01 Sex: female Admission Date (Current Location): 07/22/2018  [place] and [place] Number:  Engineering geologist and Address:  [facility], [address], [place], [place] [address]      Provider Number: [id number]  Attending Physician Name and Address:  Saundra Shelling, MD  Relative Name and Phone Number:       Current Level of Care: Hospital Recommended Level of Care: [place] Prior Approval Number:    Date Approved/Denied:   PASRR Number: [id number]  Discharge Plan: SNF    Current Diagnoses: Patient Active Problem List   Diagnosis Date Noted  . Pneumonia 07/14/2018  . Atrial flutter ([place]) 07/01/2018  . Leukocytosis 07/01/2018  . Constipation 07/01/2018  . Hypokalemia   . Paroxysmal atrial fibrillation (HCC)   . Oropharyngeal dysphagia   . Goals of care, counseling/discussion   . Palliative care encounter   . Excessive oral secretions   . Acute right arterial ischemic stroke, middle cerebral artery (MCA) ([place]) s/p IV tPA 06/21/2018  . Symptomatic anemia 03/07/2018  . TIA (transient ischemic attack) 01/22/2018  . Pressure injury of skin 01/04/2018  . Sepsis ([place]) 01/03/2018  . Protein-calorie malnutrition, severe 11/14/2017  . Atherosclerosis of native arteries of extremity with rest pain ([place]) 11/09/2017  . Ischemic leg 11/09/2017  . Atherosclerotic peripheral vascular disease with rest pain ([place]) 11/09/2017  . Atypical chest pain 09/21/2017  . Renal artery stenosis ([place]) 06/15/2017  . Hyperlipidemia 05/09/2017  . Essential hypertension 10/17/2016  . Carotid stenosis 10/17/2016  . PVD (peripheral vascular disease) ([place]) 10/17/2016  . B12 deficiency 07/19/2016  . Vitamin D deficiency, unspecified 07/19/2016  . History of right hip replacement 11/04/2015  . Aftercare following bilateral hip  joint replacement surgery 11/05/2014  . SCC (squamous cell carcinoma) 05/21/2014  . COPD with asthma ([place]) 03/16/2014  . S/P hip replacement 10/03/2012    Orientation RESPIRATION BLADDER Height & Weight     Self, Place  O2(2-4 litres) Incontinent Weight:   Height:     BEHAVIORAL SYMPTOMS/MOOD NEUROLOGICAL BOWEL NUTRITION STATUS      Continent Feeding tube(Has a PEG)  AMBULATORY STATUS COMMUNICATION OF NEEDS Skin   Extensive Assist Verbally PU Stage and Appropriate Care PU Stage 1 Dressing: (dressing to be changed)                     Personal Care Assistance Level of Assistance  Bathing, Feeding, Dressing Bathing Assistance: Maximum assistance Feeding assistance: Maximum assistance Dressing Assistance: Maximum assistance     Functional Limitations Info  Sight, Hearing, Speech Sight Info: Adequate Hearing Info: Adequate Speech Info: Impaired    SPECIAL CARE FACTORS FREQUENCY  PT (By licensed PT), OT (By licensed OT), Speech therapy     PT Frequency: 5x OT Frequency: 5x     Speech Therapy Frequency: 5x      Contractures Contractures Info: Not present    Additional Factors Info  Code Status, Allergies Code Status Info: DNR Allergies Info: see list(Percocet,Oxycodone-acetaminophen,percodan oxycodone-asparin)           Current Medications (07/25/2018):  This is the current hospital active medication list Current Facility-Administered Medications  Medication Dose Route Frequency Provider Last Rate Last Dose  . furosemide (LASIX) injection 40 mg  40 mg Intravenous BID Saundra Shelling, MD       Current Outpatient Medications  Medication Sig Dispense Refill  .  acetaminophen (TYLENOL) 160 MG/5ML solution Place 20.3 mLs (650 mg total) into feeding tube every 6 (six) hours as needed for moderate pain. 120 mL 0  . albuterol (PROVENTIL) (2.5 MG/3ML) 0.083% nebulizer solution Take 3 mLs (2.5 mg total) by nebulization every 6 (six) hours as needed for wheezing or  shortness of breath. 75 mL 12  . amLODipine (NORVASC) 10 MG tablet Place 1 tablet (10 mg total) into feeding tube daily.    Marland Kitchen antiseptic oral rinse (BIOTENE) LIQD 15 mLs by Mouth Rinse route 4 (four) times daily.    Marland Kitchen apixaban (ELIQUIS) 2.5 MG TABS tablet Take 1 tablet (2.5 mg total) by mouth 2 (two) times daily. 60 tablet   . artificial tears (LACRILUBE) OINT ophthalmic ointment Place into both eyes at bedtime.    Marland Kitchen atorvastatin (LIPITOR) 20 MG tablet Place 1 tablet (20 mg total) into feeding tube daily at 6 PM.    . bisacodyl (DULCOLAX) 10 MG suppository Place 1 suppository (10 mg total) rectally daily as needed for moderate constipation. 12 suppository 0  . cloNIDine (CATAPRES) 0.2 MG tablet Place 1 tablet (0.2 mg total) into feeding tube 2 (two) times daily. 60 tablet 11  . Cyanocobalamin (VITAMIN B-12) 2500 MCG TABS Place 5,000 mcg into feeding tube daily.    . ferrous sulfate 300 (60 Fe) MG/5ML syrup Place 5 mLs (300 mg total) into feeding tube 2 (two) times daily with a meal. 150 mL 3  . hyoscyamine (LEVSIN SL) 0.125 MG SL tablet Place 0.125 mg under the tongue every 4 (four) hours. Via tube    . ipratropium-albuterol (DUONEB) 0.5-2.5 (3) MG/3ML SOLN Take 3 mLs by nebulization every 6 (six) hours as needed.    . metoprolol tartrate (LOPRESSOR) 25 mg/10 mL SUSP Place 20 mLs (50 mg total) into feeding tube 2 (two) times daily.    Marland Kitchen omeprazole (PRILOSEC) 20 MG capsule Take 20 mg by mouth daily. Via tube. Empty contents of capsule in acidic fruit juice. Do not crush.    . polyvinyl alcohol (LIQUIFILM TEARS) 1.4 % ophthalmic solution Place 1 drop into both eyes every 4 (four) hours while awake. 15 mL 0  . Amino Acids-Protein Hydrolys (FEEDING SUPPLEMENT, PRO-STAT SUGAR FREE 64,) LIQD Place 30 mLs into feeding tube daily. 900 mL 0  . glycopyrrolate (ROBINUL) 1 MG tablet Take 1 tablet (1 mg total) by mouth 3 (three) times daily. (Patient not taking: Reported on 07/11/2018)    . indomethacin (INDOCIN)  25 MG capsule Take 25 mg by mouth 2 (two) times daily with a meal. For 3 days    . Nutritional Supplements (FEEDING SUPPLEMENT, OSMOLITE 1.2 CAL,) LIQD Place 1,000 mLs into feeding tube continuous.  0  . pantoprazole sodium (PROTONIX) 40 mg/20 mL PACK Place 20 mLs (40 mg total) into feeding tube daily. (Patient not taking: Reported on 07/07/2018) 30 each   . Water For Irrigation, Sterile (FREE WATER) SOLN Place 200 mLs into feeding tube every 6 (six) hours.       Discharge Medications: Please see discharge summary for a list of discharge medications.  Relevant Imaging Results:  Relevant Lab Results:   Additional Information SS#: [id number]  Joana Reamer, LCSW

## 2018-07-12 NOTE — ED Notes (Signed)
Awaiting for IV team to arrive pt is resting with son at bedside. IV team refferal was placed as EDp attempted [place] IV as well as EJ and was unsuccessful. Pt awaiting IV acess at this time.

## 2018-07-12 NOTE — ED Notes (Signed)
Report to Wynette, RN 

## 2018-07-12 NOTE — ED Triage Notes (Signed)
Pt here via [facility] from [facility]  With report of sob / and unresponsive  Pt with hx of cva  g-tube in place  Nonverbal DNR

## 2018-07-13 LAB — URINALYSIS, COMPLETE (UACMP) WITH MICROSCOPIC
BILIRUBIN URINE: NEGATIVE
GLUCOSE, UA: NEGATIVE mg/dL
HGB URINE DIPSTICK: NEGATIVE
Ketones, ur: NEGATIVE mg/dL
LEUKOCYTES UA: NEGATIVE
Nitrite: NEGATIVE
PH: 5 (ref 5.0–8.0)
Protein, ur: NEGATIVE mg/dL
SPECIFIC GRAVITY, URINE: 1.008 (ref 1.005–1.030)

## 2018-07-13 LAB — BASIC METABOLIC PANEL
Anion gap: 11 (ref 5–15)
BUN: 78 mg/dL — AB (ref 8–23)
CHLORIDE: 95 mmol/L — AB (ref 98–111)
CO2: 31 mmol/L (ref 22–32)
CREATININE: 1.09 mg/dL — AB (ref 0.44–1.00)
Calcium: 8.5 mg/dL — ABNORMAL LOW (ref 8.9–10.3)
GFR, EST AFRICAN AMERICAN: 51 mL/min — AB (ref >60)
GFR, EST NON AFRICAN AMERICAN: 44 mL/min — AB (ref >60)
Glucose, Bld: 119 mg/dL — ABNORMAL HIGH (ref 70–99)
Potassium: 4.6 mmol/L (ref 3.5–5.1)
SODIUM: 137 mmol/L (ref 135–145)

## 2018-07-13 LAB — CBC
HCT: 25.4 % — ABNORMAL LOW (ref 35.0–47.0)
Hemoglobin: 8.5 g/dL — ABNORMAL LOW (ref 12.0–16.0)
MCH: 29.8 pg (ref 26.0–34.0)
MCHC: 33.5 g/dL (ref 32.0–36.0)
MCV: 88.9 fL (ref 80.0–100.0)
PLATELETS: 432 10*3/uL (ref 150–440)
RBC: 2.86 MIL/uL — AB (ref 3.80–5.20)
RDW: 17.4 % — AB (ref 11.5–14.5)
WBC: 6.1 10*3/uL (ref 3.6–11.0)

## 2018-07-13 MED ORDER — FERROUS SULFATE 220 (44 FE) MG/5ML PO ELIX
325.0000 mg | ORAL_SOLUTION | Freq: Two times a day (BID) | ORAL | Status: DC
  Administered 2018-07-13 – 2018-07-14 (×2): 325 mg via ORAL
  Filled 2018-07-13 (×3): qty 7.4

## 2018-07-13 MED ORDER — GLYCOPYRROLATE 0.2 MG/ML IJ SOLN
0.1000 mg | INTRAMUSCULAR | Status: DC
  Administered 2018-07-13 – 2018-07-15 (×13): 0.1 mg via INTRAVENOUS
  Filled 2018-07-13 (×17): qty 0.5

## 2018-07-13 NOTE — Significant Event (Signed)
Rapid Response Event Note  Overview: Time Called: 2117 Arrival Time: 2120 Event Type: Respiratory  Initial Focused Assessment: Pt. Alert at baseline per Care RN, positive JVD and crackles heard bedside w/o stethoscope.  Pt. On 6L [place], VSS, congested cough with some mvmt of secretions.  Care RN had given lasix @ 1942 per report, pt. Incontinent so unsure of exact UOP  Interventions: Dr. Duane Boston paged- discussed assessment, admission Dx, Code status. Foley ordered, additional dose of Lasix ordered, HFNC to be placed by RT.  MD also ordered "RN may pronounce"  Plan of Care (if not transferred): Instructed RN to call back with any further concerns.   Domingo Pulse [place]

## 2018-07-13 NOTE — Clinical Social Work Note (Signed)
The CSW received a verbal consult from the Chaplain advising that the patient's family would like to amend the AD to note that the patient would prefer cremation instead of burial. The Chaplain does not feel the patient is at capacity to sign the changes for the AD. The CSW has updated the team of a need for psych consult for capacity for this need. CSW is following for SNF with hospice following vs. Hospice home.   Santiago Bumpers, MSW, Latanya Presser [phone]

## 2018-07-13 NOTE — Plan of Care (Signed)
  Problem: Clinical Measurements: Goal: Will remain free from infection Outcome: Progressing Goal: Diagnostic test results will improve Outcome: Progressing   Problem: Activity: Goal: Risk for activity intolerance will decrease Outcome: Progressing   Problem: Nutrition: Goal: Adequate nutrition will be maintained Outcome: Progressing

## 2018-07-13 NOTE — Progress Notes (Signed)
[place] at [place] NAME: Dawn [name]    MR#:  [id number]  DATE OF BIRTH:  1928-11-23  SUBJECTIVE:  CHIEF COMPLAINT:   Chief Complaint  Patient presents with  . Respiratory Distress   -Chronically ill-appearing patient, on nonrebreather mask. -Son at bedside  REVIEW OF SYSTEMS:  Review of Systems  Unable to perform ROS: Critical illness    DRUG ALLERGIES:   Allergies  Allergen Reactions  . Percocet [Oxycodone-Acetaminophen] Itching  . Percodan [Oxycodone-Aspirin] Itching    VITALS:  Blood pressure (!) 129/50, pulse 69, temperature 98.2 F (36.8 C), temperature source Oral, resp. rate 18, height 5\' 7"  (1.702 m), weight 55.1 kg, SpO2 92 %.  PHYSICAL EXAMINATION:  Physical Exam  GENERAL:  84 y.o.-year-old ill nourished and thin built patient lying in the bed, appears very ill.  EYES: Pupils equal, round, reactive to light and accommodation. No scleral icterus. Extraocular muscles intact.  HEENT: Head atraumatic, normocephalic. Oropharynx and nasopharynx clear.  NECK:  Supple, no jugular venous distention. No thyroid enlargement, no tenderness.  LUNGS: Coarse breath sounds bilaterally, no wheezing, but diffuse crackles noted all over. No use of accessory muscles of respiration.  CARDIOVASCULAR: S1, S2 normal. No rubs, or gallops.  3/6 systolic murmur is present ABDOMEN: Soft, nontender, nondistended.  PEG tube in place.  Bowel sounds present. No organomegaly or mass.  EXTREMITIES: No pedal edema, cyanosis, or clubbing.  NEUROLOGIC: Alert and not following commands.  According to son she was nodding head.  Left-sided hemiparesis from a previous stroke.  Withdrawing on the right side. PSYCHIATRIC: The patient is alert and nonverbal SKIN: No obvious rash, lesion, or ulcer.    LABORATORY PANEL:   CBC Recent Labs  Lab 07/13/18 0315  WBC 6.1  HGB 8.5*  HCT 25.4*  PLT 432    ------------------------------------------------------------------------------------------------------------------  Chemistries  Recent Labs  Lab 07/13/18 0315  NA 137  K 4.6  CL 95*  CO2 31  GLUCOSE 119*  BUN 78*  CREATININE 1.09*  CALCIUM 8.5*   ------------------------------------------------------------------------------------------------------------------  Cardiac Enzymes Recent Labs  Lab 07/01/2018 1136  TROPONINI 0.06*   ------------------------------------------------------------------------------------------------------------------  RADIOLOGY:  Dg Chest Portable 1 View  Result Date: 07/27/2018 CLINICAL DATA:  Altered mental status.  Shortness of breath.  Cough. EXAM: PORTABLE CHEST 1 VIEW COMPARISON:  06/30/2018. FINDINGS: Cardiomegaly with diffuse bilateral pulmonary infiltrates and bilateral pleural effusions. Findings consistent with CHF. Questionable small air collection noted over the right lung base. Small cavities such as within a cavitary pneumonia cannot be excluded and continued follow-up exam suggested. No pneumothorax. Degenerative change thoracic spine. Postsurgical changes both shoulders. IMPRESSION: Number congestive heart failure with bilateral pulmonary edema and bilateral pleural effusions. 2. Small air collections noted over the right lung base cannot be excluded. Small cavities such as within a cavitary pneumonia cannot be excluded. Continued follow-up chest x-ray suggested. Electronically Signed   By: Marcello Moores  Register   On: 07/05/2018 07:28    EKG:   Orders placed or performed during the hospital encounter of 07/19/2018  . EKG 12-Lead  . EKG 12-Lead  . EKG 12-Lead  . EKG 12-Lead    ASSESSMENT AND PLAN:   84 year old female with past medical history significant for stroke with left hemiparesis, hypertension, A. fib on anticoagulation, dysphagia status post PEG tube from a nursing home brought in secondary to decreased responsiveness.  1.  Acute  hypoxic respiratory failure-secondary to pneumonia and congestive heart failure -Recent echocardiogram with diastolic dysfunction. -Chest  x-ray showing cavitary pneumonia in the right lung base-multiple recurrent aspirations cannot be ruled out -Continue IV Lasix, broad-spectrum antibiotics -Currently on nonrebreather mask. -Extensive discussion with son at bedside.  Agrees with DNR at this time and is very hopeful that his mom can turn around. -Prognosis clearly explained.  Son wants to give her another day or 2 before he makes any other decisions. -He is the healthcare power of attorney.  2.  Acute renal failure-secondary to sepsis, ATN.  Monitor and avoid nephrotoxins.  3.  Anemia of chronic disease-no indication for transfusion at this time, hemoglobin is stable.  4.  A. fib-rate controlled-on metoprolol, on Eliquis for anticoagulation  5. Hypertension-on clonidine, Norvasc, Lasix, metoprolol  6.  DVT prophylaxis-already on Eliquis    Patient is from [facility], bedbound at baseline.   All the records are reviewed and case discussed with Care Management/Social Workerr. Management plans discussed with the patient, family and they are in agreement.  CODE STATUS: DNR  TOTAL TIME TAKING CARE OF THIS PATIENT: 38 minutes.   POSSIBLE D/C IN ? DAYS, DEPENDING ON CLINICAL CONDITION.   Gladstone Lighter M.D on 07/13/2018 at 12:49 PM  Between 7am to 6pm - Pager - [phone]  After 6pm go to [email or web address] - password [email or web address] [place] [facility]  Office  [phone]  CC: Primary care physician; Idelle Crouch, MD

## 2018-07-14 LAB — CBC
HCT: 26.5 % — ABNORMAL LOW (ref 35.0–47.0)
Hemoglobin: 8.8 g/dL — ABNORMAL LOW (ref 12.0–16.0)
MCH: 29.8 pg (ref 26.0–34.0)
MCHC: 33 g/dL (ref 32.0–36.0)
MCV: 90.1 fL (ref 80.0–100.0)
PLATELETS: 414 10*3/uL (ref 150–440)
RBC: 2.95 MIL/uL — AB (ref 3.80–5.20)
RDW: 16.8 % — AB (ref 11.5–14.5)
WBC: 7.1 10*3/uL (ref 3.6–11.0)

## 2018-07-14 LAB — BASIC METABOLIC PANEL
ANION GAP: 8 (ref 5–15)
BUN: 87 mg/dL — ABNORMAL HIGH (ref 8–23)
CALCIUM: 8.4 mg/dL — AB (ref 8.9–10.3)
CO2: 35 mmol/L — ABNORMAL HIGH (ref 22–32)
Chloride: 97 mmol/L — ABNORMAL LOW (ref 98–111)
Creatinine, Ser: 1.15 mg/dL — ABNORMAL HIGH (ref 0.44–1.00)
GFR, EST AFRICAN AMERICAN: 48 mL/min — AB (ref >60)
GFR, EST NON AFRICAN AMERICAN: 42 mL/min — AB (ref >60)
GLUCOSE: 178 mg/dL — AB (ref 70–99)
Potassium: 4.4 mmol/L (ref 3.5–5.1)
SODIUM: 140 mmol/L (ref 135–145)

## 2018-07-14 MED ORDER — ACETAMINOPHEN 160 MG/5ML PO SOLN
650.0000 mg | Freq: Four times a day (QID) | ORAL | Status: DC | PRN
  Filled 2018-07-14: qty 20.3

## 2018-07-14 MED ORDER — SODIUM CHLORIDE 0.9 % IV SOLN
INTRAVENOUS | Status: DC
  Administered 2018-07-14 – 2018-07-15 (×2): via INTRAVENOUS

## 2018-07-14 MED ORDER — MORPHINE SULFATE (CONCENTRATE) 10 MG/0.5ML PO SOLN: 10.0000 mg | ORAL | Status: DC | PRN

## 2018-07-14 MED ORDER — SODIUM CHLORIDE 0.9% FLUSH
3.0000 mL | INTRAVENOUS | Status: DC | PRN
  Administered 2018-07-14: 3 mL via INTRAVENOUS

## 2018-07-14 MED ORDER — MORPHINE SULFATE (PF) 2 MG/ML IV SOLN
1.0000 mg | INTRAVENOUS | Status: DC | PRN
  Administered 2018-07-14: 20:00:00 2 mg via INTRAVENOUS
  Filled 2018-07-14: qty 1

## 2018-07-14 MED ORDER — ACETAMINOPHEN 650 MG RE SUPP: 650.0000 mg | RECTAL | Status: DC | PRN

## 2018-07-14 MED ORDER — LORAZEPAM 2 MG/ML IJ SOLN: 0.5000 mg | INTRAMUSCULAR | Status: DC | PRN

## 2018-07-14 NOTE — Progress Notes (Signed)
Nutrition Brief Note  Patient identified to be seen in setting of tube feeding.  Dawn [name] is an 84 year old female with PMHx of recent CVA with residual left hemiparesis, HTN, A-fib on anticoagulation, oropharyngeal dysphagia s/p PEG tube placement who is admitted from [facility] with decreased responsiveness, acute hypoxic respiratory failure secondary to PNA and CHF, also with acute renal failure.  Patient known to this RD from several previous admissions. She recently had a PEG tube placed on 06/28/2018 in setting of dysphagia/aspiration following a CVA. She has been at [facility] Tube feed regimen has been Osmolite 1.5 Cal at 50 mL/hr over 22 hours with free water flush of 240 mL free water flush Q6hrs. She has been NPO.  Patient currently unresponsive. Son at bedside who is also known to this RD. He reports that prior to recent stroke patient had been living at home, eating better, and gaining weight. He reports she got up to 105 lbs from around 91 lbs. After her stroke she was unable to swallow and has been receiving tube feeding. He reports she was initially doing well on the tube feeding, but now is not tolerating very well and contents are starting to regurgitate. He also understands that PEG tube does not reduce risk of aspiration and understands that current PNA may be related to aspiration. He reports he knows she is not getting better and they are considering discontinuing tube feeds, especially since fluid around her lungs is building up more. We discussed that as we draw closer to end of life our body does not need the nutrition or hydration it once did and we do not feel hunger or thirst as we used to. Discussed that discontinuation of artificial nutrition is an appropriate choice when focusing on comfort and that it can actually lessen physical discomfort.  Discussed with RN and MD. Reviewed chart in the afternoon and patient is now transitioning to comfort care. Tube feed  orders have been discontinued. No further nutrition interventions warranted at this time. Please consult RD as needed.   Willey Blade, MS, [place], LDN Office: [phone] Pager: [phone] After Hours/Weekend Pager: [phone]

## 2018-07-14 NOTE — Progress Notes (Signed)
Son has decided to pursure comfort care with Gtube feedings and some meds discontinued. Oral care performed. Respirations easy/even/shallow with less audible congestion. Pastor and visitors in. Pt has restful/peaceful appearance.

## 2018-07-14 NOTE — Progress Notes (Signed)
[place] at [place] NAME: Jeffery Gammell    MR#:  [id number]  DATE OF BIRTH:  [birth year]  SUBJECTIVE:  CHIEF COMPLAINT:   Chief Complaint  Patient presents with  . Respiratory Distress   -appears about the same, not responding  -Son at bedside  REVIEW OF SYSTEMS:  Review of Systems  Unable to perform ROS: Critical illness    DRUG ALLERGIES:   Allergies  Allergen Reactions  . Percocet [Oxycodone-Acetaminophen] Itching  . Percodan [Oxycodone-Aspirin] Itching    VITALS:  Blood pressure (!) 134/49, pulse 71, temperature (!) 97.5 F (36.4 C), temperature source Oral, resp. rate 16, height 5\' 7"  (1.702 m), weight 55.1 kg, SpO2 100 %.  PHYSICAL EXAMINATION:  Physical Exam  GENERAL:  84 y.o.-year-old ill nourished and thin built patient lying in the bed, appears very ill.  EYES: Pupils equal, round, reactive to light and accommodation. No scleral icterus. Extraocular muscles intact.  HEENT: Head atraumatic, normocephalic. Oropharynx and nasopharynx clear.  NECK:  Supple, no jugular venous distention. No thyroid enlargement, no tenderness.  LUNGS: Coarse breath sounds bilaterally, no wheezing, but diffuse crackles noted all over. No use of accessory muscles of respiration.  CARDIOVASCULAR: S1, S2 normal. No rubs, or gallops.  3/6 systolic murmur is present ABDOMEN: Soft, nontender, nondistended.  PEG tube in place.  Bowel sounds present. No organomegaly or mass.  EXTREMITIES: No pedal edema, cyanosis, or clubbing.  NEUROLOGIC: Alert and not following commands.  According to son she was nodding head.  Left-sided hemiparesis from a previous stroke.  Withdrawing on the right side. PSYCHIATRIC: The patient is not responding,  and nonverbal SKIN: No obvious rash, lesion, or ulcer.    LABORATORY PANEL:   CBC Recent Labs  Lab 07/14/18 0430  WBC 7.1  HGB 8.8*  HCT 26.5*  PLT 414    ------------------------------------------------------------------------------------------------------------------  Chemistries  Recent Labs  Lab 07/14/18 0430  NA 140  K 4.4  CL 97*  CO2 35*  GLUCOSE 178*  BUN 87*  CREATININE 1.15*  CALCIUM 8.4*   ------------------------------------------------------------------------------------------------------------------  Cardiac Enzymes Recent Labs  Lab 07/22/2018 1136  TROPONINI 0.06*   ------------------------------------------------------------------------------------------------------------------  RADIOLOGY:  No results found.  EKG:   Orders placed or performed during the hospital encounter of 07/13/2018  . EKG 12-Lead  . EKG 12-Lead  . EKG 12-Lead  . EKG 12-Lead    ASSESSMENT AND PLAN:   84 year old female with past medical history significant for stroke with left hemiparesis, hypertension, A. fib on anticoagulation, dysphagia status post PEG tube from a nursing home brought in secondary to decreased responsiveness.  1.  Acute hypoxic respiratory failure-secondary to pneumonia and congestive heart failure -Recent echocardiogram with diastolic dysfunction. -Chest x-ray showing cavitary pneumonia in the right lung base-multiple recurrent aspirations cannot be ruled out -Continue IV Lasix, broad-spectrum antibiotics -Currently on nonrebreather mask. Appears gravely ill -Extensive discussion with son at bedside.  Agrees with DNR at this time -Son in denial yesterday.  Understands that mom's prognosis is very limited. Agreeable for hospice -He is the healthcare power of attorney.  2.  Acute renal failure-secondary to sepsis, ATN.  Monitor and avoid nephrotoxins.  3.  Anemia of chronic disease-no indication for transfusion at this time, hemoglobin is stable.  4.  A. fib-rate controlled-on metoprolol, on Eliquis for anticoagulation  5. Hypertension-on clonidine, Norvasc, Lasix, metoprolol  6.  DVT prophylaxis- on  Eliquis    Patient is from [facility], bedbound at baseline.  All the records are reviewed and case discussed with Care Management/Social Workerr. Management plans discussed with the patient, family and they are in agreement.  CODE STATUS: DNR  TOTAL TIME TAKING CARE OF THIS PATIENT: 41 minutes.   POSSIBLE D/C IN ? DAYS, DEPENDING ON CLINICAL CONDITION.   Gladstone Lighter M.D on 07/14/2018 at 1:00 PM  Between 7am to 6pm - Pager - [phone]  After 6pm go to [email or web address] - password [email or web address] [place] [facility]  Office  [phone]  CC: Primary care physician; Idelle Crouch, MD

## 2018-07-14 NOTE — Progress Notes (Signed)
Extensive discussion with son Legrand Como who is the healthcare power of attorney at bedside, also spoke with patient's daughter Ms. [name] and granddaughter [name] over the phone. -Explained the prognosis, patient's current clinical condition with no signs of improvement.  Daughter is a retired Marine scientist and granddaughter works in discharge planning in the hospital.  They do understand the situation and are agreeable for comfort care at this time. -Explained that it will involve taking off the nonrebreather mask and pacing on nasal cannula for comfort.  Also stopping all the medications and tube feeds.  We will do Roxanol for for pain and air hunger.  Robinul for increased secretions and Ativan for anxiety. -Hospice evaluation in a.m. to see if patient is stable enough for hospice home.

## 2018-07-17 LAB — CULTURE, BLOOD (ROUTINE X 2)
Culture: NO GROWTH
Culture: NO GROWTH
Special Requests: ADEQUATE
Special Requests: ADEQUATE

## 2018-07-28 NOTE — Death Summary Note (Signed)
[place] at [place] NAME: Dawn [name]    MR#:  [id number]  DATE OF BIRTH:  04/07/1928  DATE OF ADMISSION:  07/23/2018 ADMITTING PHYSICIAN: Saundra Shelling, MD  DATE OF DISCHARGE: No discharge date for patient encounter.  PRIMARY CARE PHYSICIAN: Idelle Crouch, MD    ADMISSION DIAGNOSIS:  Respiratory distress [R06.03] Healthcare-associated pneumonia [J18.9]  DISCHARGE DIAGNOSIS:  Active Problems:   Pneumonia Death  SECONDARY DIAGNOSIS:   Past Medical History:  Diagnosis Date  . Cancer ([place])    skin cancer  . 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 Hypertension   . Sciatica     HOSPITAL COURSE:  Death note  Patient found at 8:50AM on 2018/07/20 without cranial nerve reflexes, no spontaneous breathing, no pulse, the patient was pronounced dead by nursing staff   *Acute hypoxic respiratory failure secondary to pneumonia and congestive heart failure *Acute renal failure-secondary to sepsis *Anemia of chronic disease *A. fib *Hypertension  DISCHARGE CONDITIONS:   dead  CONSULTS OBTAINED:  Treatment Team:  Gladstone Lighter, MD  DRUG ALLERGIES:   Allergies  Allergen Reactions  . Percocet [Oxycodone-Acetaminophen] Itching  . Percodan [Oxycodone-Aspirin] Itching    DISCHARGE MEDICATIONS:   none   DISCHARGE INSTRUCTIONS:   If you experience worsening of your admission symptoms, develop shortness of breath, life threatening emergency, suicidal or homicidal thoughts you must seek medical attention immediately by calling 911 or calling your MD immediately  if symptoms less severe.  You Must read complete instructions/literature along with all the possible adverse reactions/side effects for all the Medicines you take and that have been prescribed to you. Take any new Medicines after you have completely understood and accept all the possible  adverse reactions/side effects.   Please note  You were cared for by a hospitalist during your hospital stay. If you have any questions about your discharge medications or the care you received while you were in the hospital after you are discharged, you can call the unit and asked to speak with the hospitalist on call if the hospitalist that took care of you is not available. Once you are discharged, your primary care physician will handle any further medical issues. Please note that NO REFILLS for any discharge medications will be authorized once you are discharged, as it is imperative that you return to your primary care physician (or establish a relationship with a primary care physician if you do not have one) for your aftercare needs so that they can reassess your need for medications and monitor your lab values.    Today   CHIEF COMPLAINT:   Chief Complaint  Patient presents with  . Respiratory Distress    HISTORY OF PRESENT ILLNESS:  84 y.o. female with a known history of CVA, hypertension, paroxysmal atrial fibrillation, oropharyngeal dysphagia on PEG tube feeding, skin cancer, sciatica, hypertension currently is a resident of [facility] facility.  She was found to be lethargic with decreased responsiveness and oxygen saturation was low.  Patient was evaluated and transferred to the emergency room at our hospital.  She was worked up with chest x-ray which showed a right lung pneumonia with congestion and fluid.  Patient started on broad-spectrum IV antibiotics and put on oxygen via nasal cannula.  Not a great historian.  VITAL SIGNS:  Blood pressure (!) 131/49, pulse (!) 101, temperature 98.4 F (36.9 C), temperature source Oral, resp. rate  18, height 5\' 7"  (1.702 m), weight 55.1 kg, SpO2 (!) 61 %.  I/O:    Intake/Output Summary (Last 24 hours) at 2018/07/23 1058 Last data filed at 23-Jul-2018 0400 Gross per 24 hour  Intake 602 ml  Output 825 ml  Net -223 ml    PHYSICAL  EXAMINATION:  GENERAL:  84 y.o.-year-old patient lying in the bed with no acute distress.  EYES: Pupils equal, round, reactive to light and accommodation. No scleral icterus. Extraocular muscles intact.  HEENT: Head atraumatic, normocephalic. Oropharynx and nasopharynx clear.  NECK:  Supple, no jugular venous distention. No thyroid enlargement, no tenderness.  LUNGS: Normal breath sounds bilaterally, no wheezing, rales,rhonchi or crepitation. No use of accessory muscles of respiration.  CARDIOVASCULAR: S1, S2 normal. No murmurs, rubs, or gallops.  ABDOMEN: Soft, non-tender, non-distended. Bowel sounds present. No organomegaly or mass.  EXTREMITIES: No pedal edema, cyanosis, or clubbing.  NEUROLOGIC: Cranial nerves II through XII are intact. Muscle strength 5/5 in all extremities. Sensation intact. Gait not checked.  PSYCHIATRIC: The patient is alert and oriented x 3.  SKIN: No obvious rash, lesion, or ulcer.   DATA REVIEW:   CBC Recent Labs  Lab 07/14/18 0430  WBC 7.1  HGB 8.8*  HCT 26.5*  PLT 414    Chemistries  Recent Labs  Lab 07/14/18 0430  NA 140  K 4.4  CL 97*  CO2 35*  GLUCOSE 178*  BUN 87*  CREATININE 1.15*  CALCIUM 8.4*    Cardiac Enzymes Recent Labs  Lab 07/26/2018 1136  TROPONINI 0.06*    Microbiology Results  Results for orders placed or performed during the hospital encounter of 07/20/2018  Blood culture (routine x 2)     Status: None (Preliminary result)   Collection Time: 07/10/2018 11:36 AM  Result Value Ref Range Status   Specimen Description BLOOD R FA  Final   Special Requests   Final    BOTTLES DRAWN AEROBIC AND ANAEROBIC Blood Culture adequate volume   Culture   Final    NO GROWTH 3 DAYS Performed at [facility], [address] [place], [place] [address]    Report Status PENDING  Incomplete  Blood culture (routine x 2)     Status: None (Preliminary result)   Collection Time: 07/20/2018  3:12 PM  Result Value Ref Range Status    Specimen Description BLOOD RIGHT HAND  Final   Special Requests   Final    BOTTLES DRAWN AEROBIC AND ANAEROBIC Blood Culture adequate volume   Culture   Final    NO GROWTH 3 DAYS Performed at [facility], [address], [place], [place] [address]    Report Status PENDING  Incomplete  MRSA PCR Screening     Status: Abnormal   Collection Time: 07/09/2018  8:17 PM  Result Value Ref Range Status   MRSA by PCR POSITIVE (A) NEGATIVE Final    Comment:        The GeneXpert MRSA Assay (FDA approved for NASAL specimens only), is one component of a comprehensive MRSA colonization surveillance program. It is not intended to diagnose MRSA infection nor to guide or monitor treatment for MRSA infections. RESULT CALLED TO, READ BACK BY AND VERIFIED WITH: [facility] AT 2208 07/20/2018.PMH Performed at [facility], [address] [place], [place] [address]     RADIOLOGY:  No results found.  EKG:   Orders placed or performed during the hospital encounter of 07/04/2018  . EKG 12-Lead  . EKG 12-Lead  .  EKG 12-Lead  . EKG 12-Lead      Management plans discussed with the patient, family and they are in agreement.  CODE STATUS:     Code Status Orders  (From admission, onward)         Start     Ordered   07/07/2018 1553  Do not attempt resuscitation (DNR)  Continuous    Question Answer Comment  In the event of cardiac or respiratory ARREST Do not call a "code blue"   In the event of cardiac or respiratory ARREST Do not perform Intubation, CPR, defibrillation or ACLS   In the event of cardiac or respiratory ARREST Use medication by any route, position, wound care, and other measures to relive pain and suffering. May use oxygen, suction and manual treatment of airway obstruction as needed for comfort.   Comments advanced directive      07/14/2018 1552        Code Status History    Date Active Date Inactive Code Status Order ID Comments User Context   06/21/2018  1539 07/04/2018 1747 DNR [id number]  Aroor, Lanice Schwab, MD Inpatient   06/21/2018 1505 06/21/2018 1539 Full Code [id number]  Aroor, Lanice Schwab, MD Inpatient   03/07/2018 1856 03/08/2018 2249 Full Code [id number]  Henreitta Leber, MD Inpatient   01/22/2018 1949 01/23/2018 2253 Full Code [id number]  Saundra Shelling, MD Inpatient   01/03/2018 1521 01/14/2018 1954 Full Code [id number]  Salary, Avel Peace, MD Inpatient   11/09/2017 1131 11/22/2017 1913 Full Code [id number]  Sela Hua, PA-C Inpatient   11/09/2017 1019 11/09/2017 1122 Full Code [id number]  Sela Hua, PA-C Outpatient   11/09/2017 1019 11/09/2017 1019 Full Code [id number]  Algernon Huxley, MD Outpatient   07/12/2017 1005 07/12/2017 1656 Full Code [id number]  Algernon Huxley, MD Inpatient   06/21/2017 1125 06/21/2017 1622 Full Code [id number]  Algernon Huxley, MD Inpatient   04/18/2017 1150 04/18/2017 1302 Full Code [id number]  Algernon Huxley, MD Inpatient   04/18/2017 1104 04/18/2017 1150 Full Code [id number]  Algernon Huxley, MD Inpatient    Advance Directive Documentation     Most Recent Value  Type of Advance Directive  Healthcare Power of [place], Out of facility DNR (pink MOST or yellow form)  Pre-existing out of facility DNR order (yellow form or pink MOST form)  Physician notified to receive inpatient order  "MOST" Form in Place?  -      TOTAL TIME TAKING CARE OF THIS PATIENT: 40 minutes.    Avel Peace Salary M.D on 2018-08-08 at 10:58 AM  Between 7am to 6pm - Pager - [phone]  After 6pm go to [email or web address] - password [email or web address] [place] [facility]  Office  [phone]  CC: Primary care physician; Idelle Crouch, MD   Note: This dictation was prepared with Dragon dictation along with smaller phrase technology. Any transcriptional errors that result from this process are unintentional.

## 2018-07-28 NOTE — Progress Notes (Addendum)
Patient passed away and pronounced deceased at 62. Verified with Aldona Lento RN.  MD notified, family in room, [facility] notified.

## 2018-07-28 DEATH — deceased

## 2018-08-05 ENCOUNTER — Ambulatory Visit: Payer: Medicare Other | Admitting: Adult Health

## 2018-10-16 ENCOUNTER — Other Ambulatory Visit: Payer: Self-pay

## 2018-10-22 ENCOUNTER — Ambulatory Visit (INDEPENDENT_AMBULATORY_CARE_PROVIDER_SITE_OTHER): Payer: Medicare Other | Admitting: Vascular Surgery

## 2018-10-22 ENCOUNTER — Encounter (INDEPENDENT_AMBULATORY_CARE_PROVIDER_SITE_OTHER): Payer: Medicare Other

## 2018-12-13 ENCOUNTER — Encounter (INDEPENDENT_AMBULATORY_CARE_PROVIDER_SITE_OTHER): Payer: Medicare Other

## 2018-12-13 ENCOUNTER — Ambulatory Visit (INDEPENDENT_AMBULATORY_CARE_PROVIDER_SITE_OTHER): Payer: Medicare Other | Admitting: Vascular Surgery

## 2019-05-05 IMAGING — CT CT HEAD W/O CM
4 of 5 series · 14 of 47 positions shown, 16 images · non-contrast
Comparison: CT head without contrast 06/21/2018. CTA head and neck
and CT perfusion head 06/21/2018.

CLINICAL DATA: Focal neuro deficit, less than 6 hours. TPA 24 hours
ago.

EXAM:
CT HEAD WITHOUT CONTRAST
TECHNIQUE: Contiguous axial images were obtained from the base of the skull
through the vertex without intravenous contrast.

[Series 3: head without ax · axial · non-contrast · 0.31mm/px · z∈[-79,+32]mm · 5 of 35 slices shown, 7 images]
[im 6/35  brain]
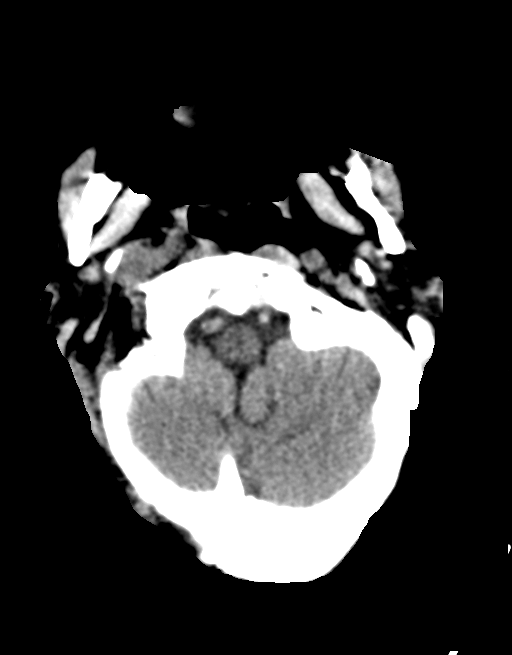
[im 6/35  bone]
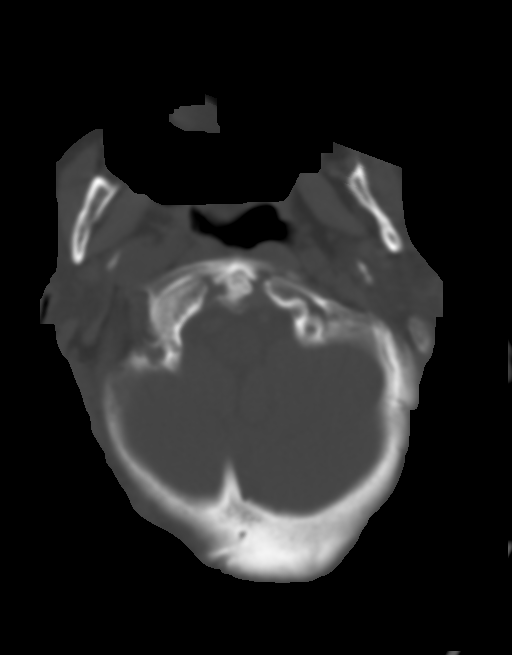
[im 12/35  brain]
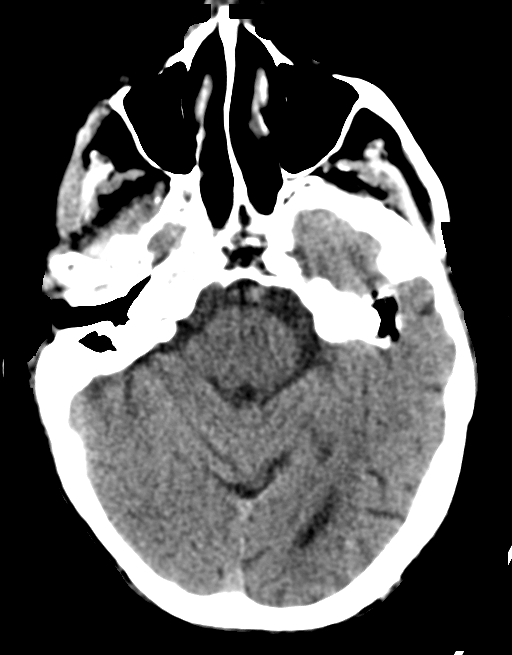
[im 18/35  brain]
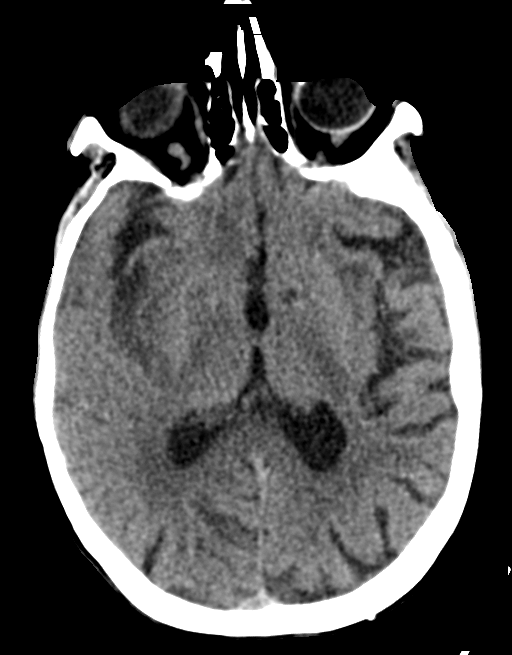
[im 23/35  brain]
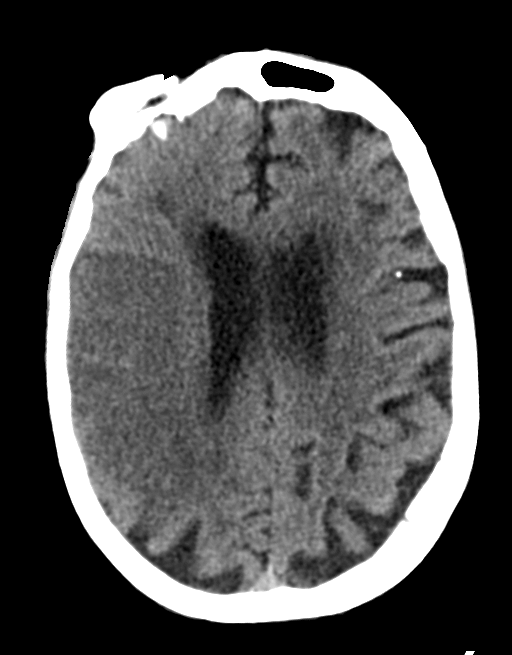
[im 29/35  brain]
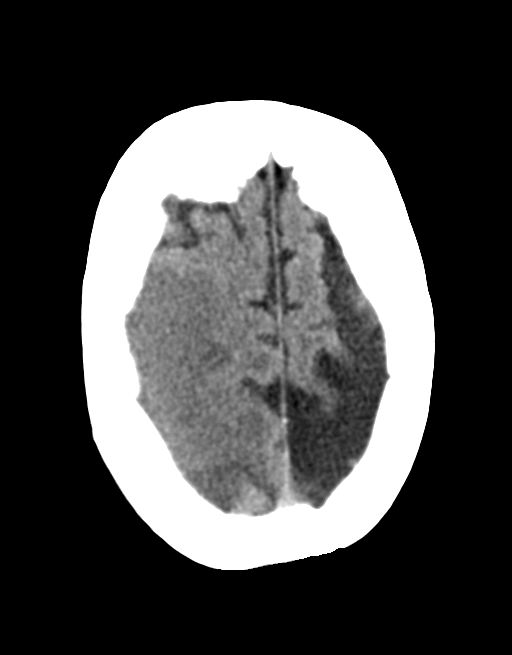
[im 29/35  bone]
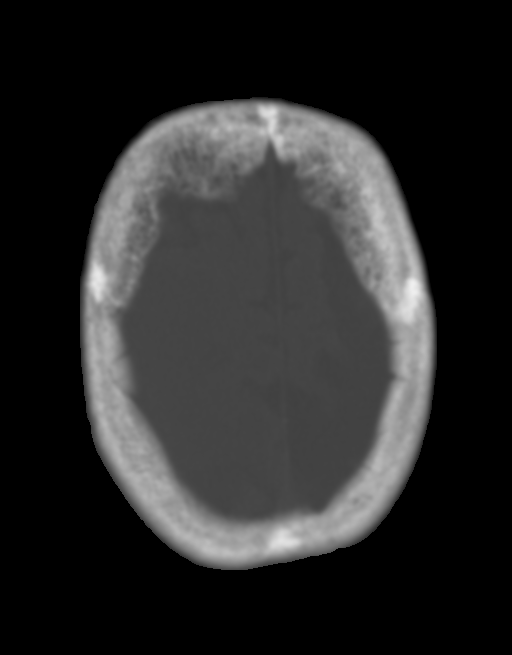

[Series 5: head without cor · coronal · non-contrast · 0.32mm/px · 3 of 67 slices shown]
[im 23/67  brain]
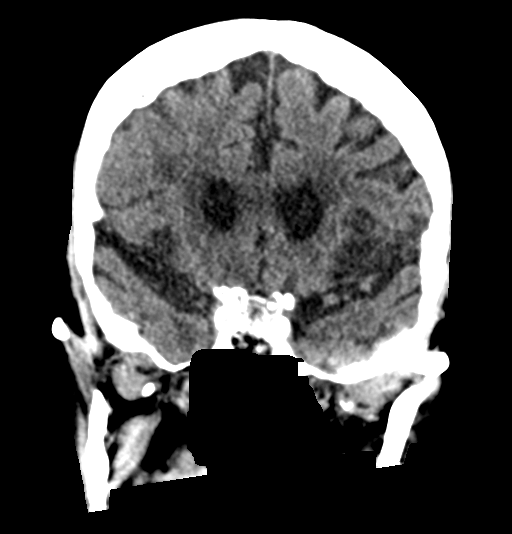
[im 30/67  brain]
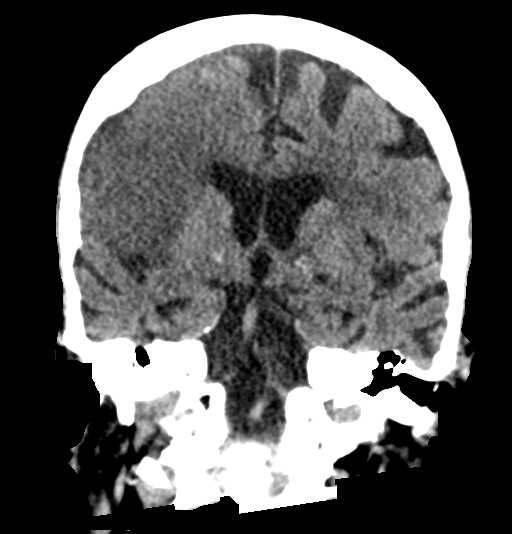
[im 37/67  brain]
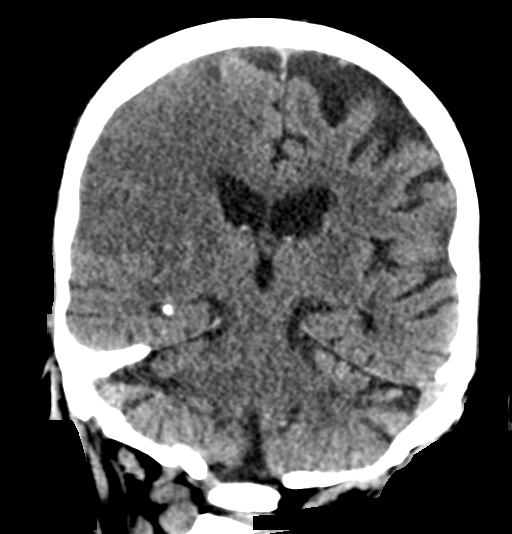

[Series 6: head without sag · sagittal · non-contrast · 0.32mm/px · 3 of 62 slices shown]
[im 23/62  brain]
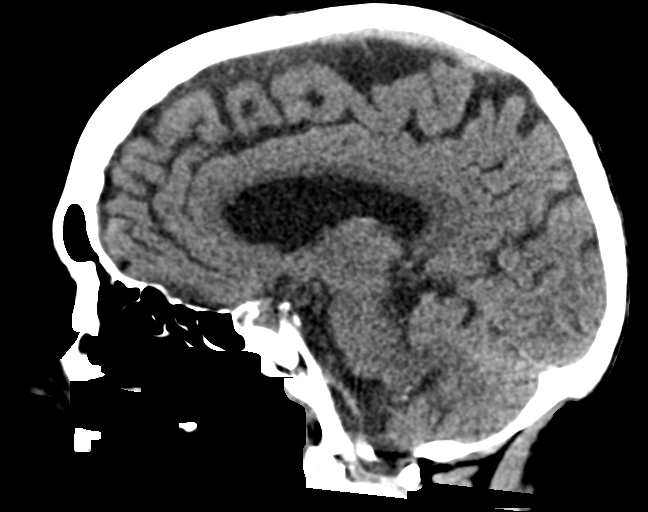
[im 31/62  brain]
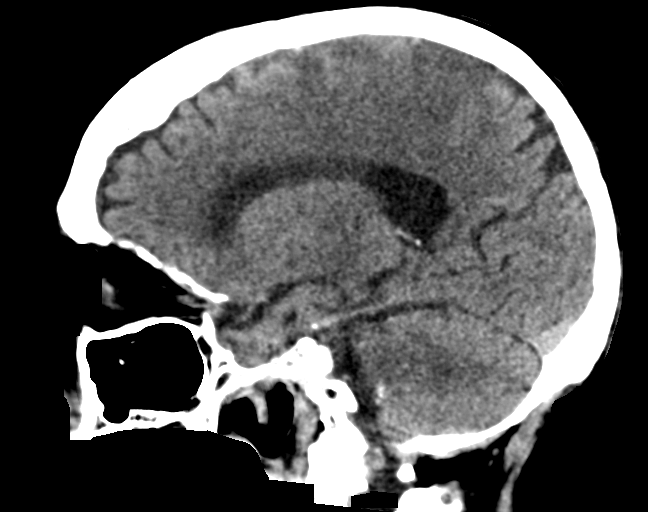
[im 39/62  brain]
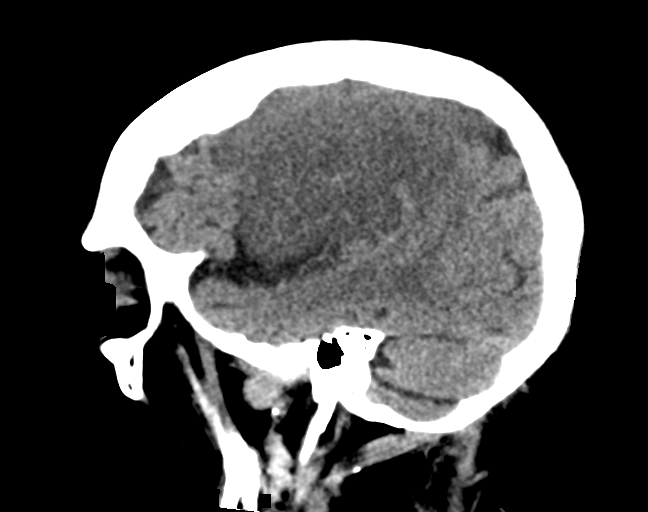

[Series 7: head without · axial · non-contrast · 0.39mm/px · z∈[-43,+12]mm · 3 of 34 slices shown]
[im 6/34  brain]
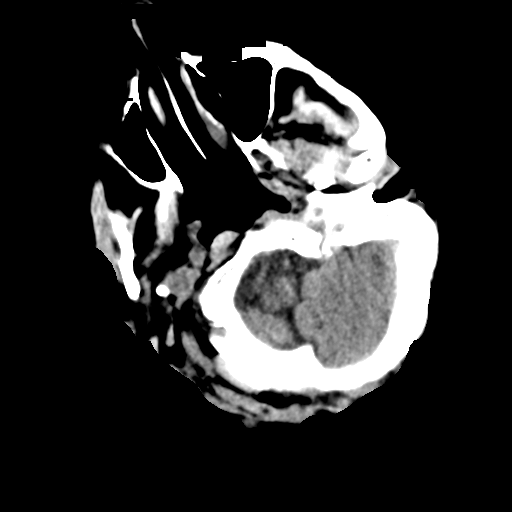
[im 12/34  brain]
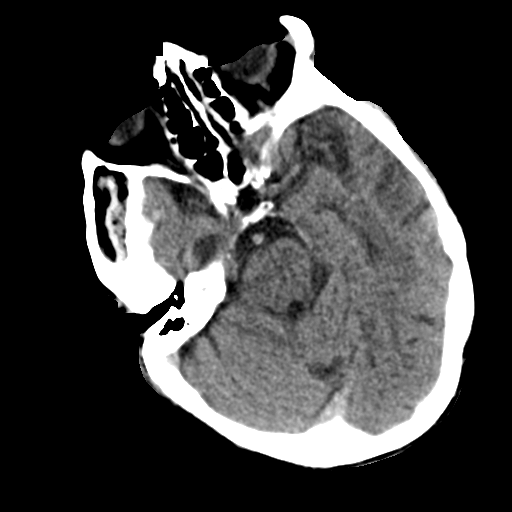
[im 17/34  brain]
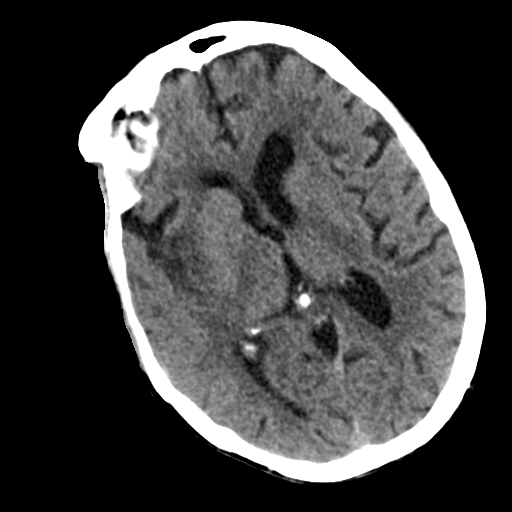

[14 of 47 positions shown; findings below may reference images not displayed]

FINDINGS: Brain: A large right MCA territory infarct corresponds to the area
of core infarct on the CT perfusion exam. There is involvement of
the right frontal operculum and insular cortex. Basal ganglia are
seen.. Right ACA territory is spared.

There is local mass effect with effacement of the adjacent sulci.
There is no midline shift. There is partial effacement of the right
lateral ventricle. Left lateral ventricle is within normal limits.
Brainstem and cerebellum are normal. There is no hemorrhage into the
infarct.

Vascular: Atherosclerotic calcifications are present within the
cavernous internal carotid arteries bilaterally. There is no
hyperdense vessel.

Skull: Calvarium is intact. No focal lytic or blastic lesions are
present.

Sinuses/Orbits: The paranasal sinuses and mastoid air cells are
clear. Bilateral lens replacements are present. Globes and orbits
are otherwise within normal limits.
IMPRESSION: 1. Involving nonhemorrhagic right MCA territory infarct corresponds
with the area predicted by CT perfusion.
2. No hemorrhage within the infarct.
3. Local mass effect without significant midline shift.
4. Generalized atrophy and white matter disease is otherwise stable.
